# Patient Record
Sex: Female | Born: 1950 | Race: Black or African American | Hispanic: No | Marital: Married | Smoking: Never smoker
Health system: Southern US, Community
[De-identification: ages and names within clinical notes are randomized; demographics above are authoritative.]

## PROBLEM LIST (undated history)

## (undated) DIAGNOSIS — T4145XA Adverse effect of unspecified anesthetic, initial encounter: Secondary | ICD-10-CM

## (undated) DIAGNOSIS — M1990 Unspecified osteoarthritis, unspecified site: Secondary | ICD-10-CM

## (undated) DIAGNOSIS — E785 Hyperlipidemia, unspecified: Secondary | ICD-10-CM

## (undated) DIAGNOSIS — R0789 Other chest pain: Secondary | ICD-10-CM

## (undated) DIAGNOSIS — Z9889 Other specified postprocedural states: Secondary | ICD-10-CM

## (undated) DIAGNOSIS — K859 Acute pancreatitis without necrosis or infection, unspecified: Secondary | ICD-10-CM

## (undated) DIAGNOSIS — T8859XA Other complications of anesthesia, initial encounter: Secondary | ICD-10-CM

## (undated) DIAGNOSIS — K589 Irritable bowel syndrome without diarrhea: Secondary | ICD-10-CM

## (undated) DIAGNOSIS — R112 Nausea with vomiting, unspecified: Secondary | ICD-10-CM

## (undated) DIAGNOSIS — K219 Gastro-esophageal reflux disease without esophagitis: Secondary | ICD-10-CM

## (undated) DIAGNOSIS — I10 Essential (primary) hypertension: Secondary | ICD-10-CM

## (undated) DIAGNOSIS — E039 Hypothyroidism, unspecified: Secondary | ICD-10-CM

## (undated) DIAGNOSIS — J189 Pneumonia, unspecified organism: Secondary | ICD-10-CM

## (undated) HISTORY — PX: CHOLECYSTECTOMY: SHX55

## (undated) HISTORY — DX: Hyperlipidemia, unspecified: E78.5

## (undated) HISTORY — DX: Irritable bowel syndrome, unspecified: K58.9

## (undated) HISTORY — DX: Other chest pain: R07.89

## (undated) HISTORY — DX: Acute pancreatitis without necrosis or infection, unspecified: K85.90

## (undated) HISTORY — DX: Hypothyroidism, unspecified: E03.9

## (undated) HISTORY — PX: TOTAL ABDOMINAL HYSTERECTOMY: SHX209

## (undated) HISTORY — DX: Gastro-esophageal reflux disease without esophagitis: K21.9

## (undated) HISTORY — PX: HERNIA REPAIR: SHX51

## (undated) HISTORY — PX: SALPINGOOPHORECTOMY: SHX82

---
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
## 1997-08-15 ENCOUNTER — Ambulatory Visit (HOSPITAL_COMMUNITY): Admission: RE | Admit: 1997-08-15 | Discharge: 1997-08-15 | Payer: Self-pay | Admitting: *Deleted

## 1997-09-05 ENCOUNTER — Encounter: Admission: RE | Admit: 1997-09-05 | Discharge: 1997-12-04 | Payer: Self-pay | Admitting: Orthopedic Surgery

## 1997-09-26 ENCOUNTER — Ambulatory Visit (HOSPITAL_COMMUNITY): Admission: RE | Admit: 1997-09-26 | Discharge: 1997-09-26 | Payer: Self-pay | Admitting: Orthopedic Surgery

## 1997-10-16 ENCOUNTER — Ambulatory Visit (HOSPITAL_COMMUNITY): Admission: RE | Admit: 1997-10-16 | Discharge: 1997-10-16 | Payer: Self-pay | Admitting: Orthopedic Surgery

## 1997-11-08 ENCOUNTER — Ambulatory Visit (HOSPITAL_COMMUNITY): Admission: RE | Admit: 1997-11-08 | Discharge: 1997-11-08 | Payer: Self-pay | Admitting: Orthopedic Surgery

## 1997-12-19 ENCOUNTER — Ambulatory Visit (HOSPITAL_COMMUNITY): Admission: RE | Admit: 1997-12-19 | Discharge: 1997-12-19 | Payer: Self-pay | Admitting: Orthopedic Surgery

## 1998-02-01 ENCOUNTER — Encounter: Admission: RE | Admit: 1998-02-01 | Discharge: 1998-05-02 | Payer: Self-pay | Admitting: Neurological Surgery

## 1998-03-20 ENCOUNTER — Encounter: Admission: RE | Admit: 1998-03-20 | Discharge: 1998-06-09 | Payer: Self-pay | Admitting: Orthopedic Surgery

## 1998-05-08 ENCOUNTER — Ambulatory Visit (HOSPITAL_COMMUNITY): Admission: RE | Admit: 1998-05-08 | Discharge: 1998-05-08 | Payer: Self-pay | Admitting: Obstetrics and Gynecology

## 1998-06-09 ENCOUNTER — Encounter: Admission: RE | Admit: 1998-06-09 | Discharge: 1998-06-29 | Payer: Self-pay | Admitting: Orthopedic Surgery

## 1998-06-15 ENCOUNTER — Ambulatory Visit (HOSPITAL_COMMUNITY): Admission: RE | Admit: 1998-06-15 | Discharge: 1998-06-15 | Payer: Self-pay | Admitting: Gastroenterology

## 1998-09-05 ENCOUNTER — Ambulatory Visit (HOSPITAL_COMMUNITY): Admission: RE | Admit: 1998-09-05 | Discharge: 1998-09-05 | Payer: Self-pay | Admitting: Internal Medicine

## 1999-03-23 ENCOUNTER — Encounter: Payer: Self-pay | Admitting: Gastroenterology

## 1999-03-23 ENCOUNTER — Ambulatory Visit (HOSPITAL_COMMUNITY): Admission: RE | Admit: 1999-03-23 | Discharge: 1999-03-23 | Payer: Self-pay | Admitting: Gastroenterology

## 1999-04-19 ENCOUNTER — Ambulatory Visit (HOSPITAL_COMMUNITY): Admission: RE | Admit: 1999-04-19 | Discharge: 1999-04-19 | Payer: Self-pay | Admitting: General Surgery

## 1999-04-19 ENCOUNTER — Encounter: Payer: Self-pay | Admitting: General Surgery

## 1999-05-30 ENCOUNTER — Encounter: Payer: Self-pay | Admitting: Obstetrics and Gynecology

## 1999-05-30 ENCOUNTER — Ambulatory Visit (HOSPITAL_COMMUNITY): Admission: RE | Admit: 1999-05-30 | Discharge: 1999-05-30 | Payer: Self-pay | Admitting: Obstetrics and Gynecology

## 1999-06-06 ENCOUNTER — Encounter: Payer: Self-pay | Admitting: General Surgery

## 1999-06-08 ENCOUNTER — Encounter (INDEPENDENT_AMBULATORY_CARE_PROVIDER_SITE_OTHER): Payer: Self-pay | Admitting: Specialist

## 1999-06-08 ENCOUNTER — Observation Stay (HOSPITAL_COMMUNITY): Admission: RE | Admit: 1999-06-08 | Discharge: 1999-06-09 | Payer: Self-pay | Admitting: General Surgery

## 1999-08-10 ENCOUNTER — Encounter: Admission: RE | Admit: 1999-08-10 | Discharge: 1999-08-10 | Payer: Self-pay | Admitting: Otolaryngology

## 1999-08-10 ENCOUNTER — Encounter: Payer: Self-pay | Admitting: Otolaryngology

## 1999-08-17 ENCOUNTER — Encounter: Payer: Self-pay | Admitting: Internal Medicine

## 1999-08-17 ENCOUNTER — Ambulatory Visit (HOSPITAL_COMMUNITY): Admission: RE | Admit: 1999-08-17 | Discharge: 1999-08-17 | Payer: Self-pay | Admitting: Internal Medicine

## 1999-11-01 ENCOUNTER — Ambulatory Visit (HOSPITAL_COMMUNITY): Admission: RE | Admit: 1999-11-01 | Discharge: 1999-11-01 | Payer: Self-pay | Admitting: Internal Medicine

## 1999-11-14 ENCOUNTER — Encounter: Payer: Self-pay | Admitting: Internal Medicine

## 1999-11-14 ENCOUNTER — Emergency Department (HOSPITAL_COMMUNITY): Admission: EM | Admit: 1999-11-14 | Discharge: 1999-11-14 | Payer: Self-pay | Admitting: Internal Medicine

## 1999-12-12 ENCOUNTER — Ambulatory Visit (HOSPITAL_COMMUNITY): Admission: RE | Admit: 1999-12-12 | Discharge: 1999-12-12 | Payer: Self-pay | Admitting: Gastroenterology

## 2000-01-10 ENCOUNTER — Ambulatory Visit (HOSPITAL_COMMUNITY): Admission: RE | Admit: 2000-01-10 | Discharge: 2000-01-10 | Payer: Self-pay | Admitting: Gastroenterology

## 2000-01-10 ENCOUNTER — Encounter: Payer: Self-pay | Admitting: Gastroenterology

## 2000-04-11 ENCOUNTER — Encounter: Payer: Self-pay | Admitting: *Deleted

## 2000-04-11 ENCOUNTER — Ambulatory Visit (HOSPITAL_COMMUNITY): Admission: RE | Admit: 2000-04-11 | Discharge: 2000-04-11 | Payer: Self-pay

## 2000-07-15 ENCOUNTER — Ambulatory Visit (HOSPITAL_COMMUNITY): Admission: RE | Admit: 2000-07-15 | Discharge: 2000-07-15 | Payer: Self-pay | Admitting: Obstetrics and Gynecology

## 2000-07-15 ENCOUNTER — Encounter: Payer: Self-pay | Admitting: Obstetrics and Gynecology

## 2000-07-22 ENCOUNTER — Other Ambulatory Visit: Admission: RE | Admit: 2000-07-22 | Discharge: 2000-07-22 | Payer: Self-pay | Admitting: Obstetrics and Gynecology

## 2000-07-23 ENCOUNTER — Ambulatory Visit (HOSPITAL_COMMUNITY): Admission: RE | Admit: 2000-07-23 | Discharge: 2000-07-23 | Payer: Self-pay | Admitting: Internal Medicine

## 2000-10-10 ENCOUNTER — Emergency Department (HOSPITAL_COMMUNITY): Admission: EM | Admit: 2000-10-10 | Discharge: 2000-10-10 | Payer: Self-pay | Admitting: Emergency Medicine

## 2000-10-31 ENCOUNTER — Ambulatory Visit (HOSPITAL_COMMUNITY): Admission: RE | Admit: 2000-10-31 | Discharge: 2000-10-31 | Payer: Self-pay | Admitting: Gastroenterology

## 2000-10-31 ENCOUNTER — Encounter: Payer: Self-pay | Admitting: Gastroenterology

## 2001-02-09 ENCOUNTER — Emergency Department (HOSPITAL_COMMUNITY): Admission: EM | Admit: 2001-02-09 | Discharge: 2001-02-10 | Payer: Self-pay | Admitting: Emergency Medicine

## 2001-02-09 ENCOUNTER — Encounter: Payer: Self-pay | Admitting: Emergency Medicine

## 2001-03-17 ENCOUNTER — Ambulatory Visit (HOSPITAL_COMMUNITY): Admission: RE | Admit: 2001-03-17 | Discharge: 2001-03-17 | Payer: Self-pay | Admitting: Gastroenterology

## 2001-03-17 ENCOUNTER — Encounter: Payer: Self-pay | Admitting: Gastroenterology

## 2001-04-03 ENCOUNTER — Ambulatory Visit (HOSPITAL_COMMUNITY): Admission: RE | Admit: 2001-04-03 | Discharge: 2001-04-03 | Payer: Self-pay | Admitting: Gastroenterology

## 2001-04-29 ENCOUNTER — Encounter: Payer: Self-pay | Admitting: Gastroenterology

## 2001-04-29 ENCOUNTER — Ambulatory Visit (HOSPITAL_COMMUNITY): Admission: RE | Admit: 2001-04-29 | Discharge: 2001-04-29 | Payer: Self-pay | Admitting: Gastroenterology

## 2001-06-16 ENCOUNTER — Encounter: Admission: RE | Admit: 2001-06-16 | Discharge: 2001-06-16 | Payer: Self-pay | Admitting: Internal Medicine

## 2001-06-16 ENCOUNTER — Encounter: Payer: Self-pay | Admitting: Internal Medicine

## 2001-06-26 ENCOUNTER — Ambulatory Visit (HOSPITAL_COMMUNITY): Admission: RE | Admit: 2001-06-26 | Discharge: 2001-06-26 | Payer: Self-pay | Admitting: Internal Medicine

## 2001-07-08 ENCOUNTER — Ambulatory Visit (HOSPITAL_COMMUNITY): Admission: RE | Admit: 2001-07-08 | Discharge: 2001-07-08 | Payer: Self-pay | Admitting: Internal Medicine

## 2001-07-09 ENCOUNTER — Encounter: Payer: Self-pay | Admitting: Internal Medicine

## 2001-10-16 ENCOUNTER — Emergency Department (HOSPITAL_COMMUNITY): Admission: EM | Admit: 2001-10-16 | Discharge: 2001-10-16 | Payer: Self-pay

## 2001-10-19 ENCOUNTER — Ambulatory Visit (HOSPITAL_COMMUNITY): Admission: RE | Admit: 2001-10-19 | Discharge: 2001-10-19 | Payer: Self-pay | Admitting: Internal Medicine

## 2001-10-19 ENCOUNTER — Encounter: Payer: Self-pay | Admitting: Internal Medicine

## 2001-11-25 ENCOUNTER — Ambulatory Visit (HOSPITAL_COMMUNITY): Admission: RE | Admit: 2001-11-25 | Discharge: 2001-11-25 | Payer: Self-pay | Admitting: Obstetrics and Gynecology

## 2001-11-25 ENCOUNTER — Encounter: Payer: Self-pay | Admitting: Obstetrics and Gynecology

## 2001-12-07 ENCOUNTER — Encounter: Admission: RE | Admit: 2001-12-07 | Discharge: 2001-12-07 | Payer: Self-pay | Admitting: Occupational Medicine

## 2001-12-07 ENCOUNTER — Encounter: Payer: Self-pay | Admitting: Occupational Medicine

## 2002-01-04 ENCOUNTER — Encounter: Payer: Self-pay | Admitting: Internal Medicine

## 2002-02-04 ENCOUNTER — Ambulatory Visit (HOSPITAL_COMMUNITY): Admission: RE | Admit: 2002-02-04 | Discharge: 2002-02-04 | Payer: Self-pay | Admitting: Internal Medicine

## 2002-05-27 ENCOUNTER — Ambulatory Visit (HOSPITAL_COMMUNITY): Admission: RE | Admit: 2002-05-27 | Discharge: 2002-05-27 | Payer: Self-pay | Admitting: Rheumatology

## 2002-05-27 ENCOUNTER — Encounter: Payer: Self-pay | Admitting: Rheumatology

## 2002-06-04 ENCOUNTER — Encounter: Admission: RE | Admit: 2002-06-04 | Discharge: 2002-06-04 | Payer: Self-pay | Admitting: Endocrinology

## 2002-06-04 ENCOUNTER — Encounter: Payer: Self-pay | Admitting: Endocrinology

## 2002-06-11 ENCOUNTER — Encounter: Admission: RE | Admit: 2002-06-11 | Discharge: 2002-06-11 | Payer: Self-pay | Admitting: Gastroenterology

## 2002-06-11 ENCOUNTER — Encounter: Payer: Self-pay | Admitting: Gastroenterology

## 2002-06-25 ENCOUNTER — Ambulatory Visit (HOSPITAL_COMMUNITY): Admission: RE | Admit: 2002-06-25 | Discharge: 2002-06-25 | Payer: Self-pay | Admitting: Gastroenterology

## 2002-08-10 ENCOUNTER — Ambulatory Visit (HOSPITAL_COMMUNITY): Admission: RE | Admit: 2002-08-10 | Discharge: 2002-08-10 | Payer: Self-pay | Admitting: Obstetrics and Gynecology

## 2002-08-10 ENCOUNTER — Encounter: Payer: Self-pay | Admitting: Obstetrics and Gynecology

## 2002-10-29 ENCOUNTER — Encounter: Admission: RE | Admit: 2002-10-29 | Discharge: 2002-10-29 | Payer: Self-pay | Admitting: Endocrinology

## 2002-10-29 ENCOUNTER — Encounter: Payer: Self-pay | Admitting: Endocrinology

## 2002-11-16 ENCOUNTER — Encounter (HOSPITAL_COMMUNITY): Admission: RE | Admit: 2002-11-16 | Discharge: 2003-02-14 | Payer: Self-pay | Admitting: Otolaryngology

## 2002-11-17 ENCOUNTER — Encounter: Payer: Self-pay | Admitting: Otolaryngology

## 2002-12-01 ENCOUNTER — Other Ambulatory Visit: Admission: RE | Admit: 2002-12-01 | Discharge: 2002-12-01 | Payer: Self-pay | Admitting: *Deleted

## 2002-12-08 ENCOUNTER — Encounter: Payer: Self-pay | Admitting: Otolaryngology

## 2002-12-10 ENCOUNTER — Encounter: Payer: Self-pay | Admitting: Gastroenterology

## 2002-12-10 ENCOUNTER — Ambulatory Visit (HOSPITAL_COMMUNITY): Admission: RE | Admit: 2002-12-10 | Discharge: 2002-12-10 | Payer: Self-pay | Admitting: Gastroenterology

## 2003-01-06 ENCOUNTER — Encounter: Admission: RE | Admit: 2003-01-06 | Discharge: 2003-01-06 | Payer: Self-pay | Admitting: Gastroenterology

## 2003-01-06 ENCOUNTER — Encounter: Payer: Self-pay | Admitting: Gastroenterology

## 2003-03-18 ENCOUNTER — Ambulatory Visit (HOSPITAL_COMMUNITY): Admission: RE | Admit: 2003-03-18 | Discharge: 2003-03-18 | Payer: Self-pay

## 2003-04-27 ENCOUNTER — Ambulatory Visit (HOSPITAL_COMMUNITY): Admission: RE | Admit: 2003-04-27 | Discharge: 2003-04-27 | Payer: Self-pay | Admitting: Obstetrics and Gynecology

## 2003-04-29 ENCOUNTER — Ambulatory Visit (HOSPITAL_COMMUNITY): Admission: RE | Admit: 2003-04-29 | Discharge: 2003-04-29 | Payer: Self-pay | Admitting: Family Medicine

## 2003-05-06 ENCOUNTER — Ambulatory Visit (HOSPITAL_COMMUNITY): Admission: RE | Admit: 2003-05-06 | Discharge: 2003-05-06 | Payer: Self-pay | Admitting: Family Medicine

## 2003-05-10 ENCOUNTER — Ambulatory Visit (HOSPITAL_COMMUNITY): Admission: RE | Admit: 2003-05-10 | Discharge: 2003-05-10 | Payer: Self-pay | Admitting: Family Medicine

## 2003-12-13 ENCOUNTER — Emergency Department (HOSPITAL_COMMUNITY): Admission: EM | Admit: 2003-12-13 | Discharge: 2003-12-13 | Payer: Self-pay | Admitting: Emergency Medicine

## 2004-01-27 ENCOUNTER — Ambulatory Visit (HOSPITAL_COMMUNITY): Admission: RE | Admit: 2004-01-27 | Discharge: 2004-01-27 | Payer: Self-pay | Admitting: Endocrinology

## 2004-04-02 ENCOUNTER — Encounter: Admission: RE | Admit: 2004-04-02 | Discharge: 2004-04-02 | Payer: Self-pay | Admitting: Gastroenterology

## 2004-04-27 ENCOUNTER — Ambulatory Visit (HOSPITAL_COMMUNITY): Admission: RE | Admit: 2004-04-27 | Discharge: 2004-04-27 | Payer: Self-pay | Admitting: Neurological Surgery

## 2004-04-29 ENCOUNTER — Ambulatory Visit (HOSPITAL_COMMUNITY): Admission: RE | Admit: 2004-04-29 | Discharge: 2004-04-29 | Payer: Self-pay | Admitting: Neurological Surgery

## 2004-06-18 ENCOUNTER — Encounter: Admission: RE | Admit: 2004-06-18 | Discharge: 2004-06-18 | Payer: Self-pay | Admitting: Obstetrics and Gynecology

## 2004-08-08 ENCOUNTER — Other Ambulatory Visit: Admission: RE | Admit: 2004-08-08 | Discharge: 2004-08-08 | Payer: Self-pay | Admitting: Internal Medicine

## 2004-10-02 ENCOUNTER — Ambulatory Visit (HOSPITAL_COMMUNITY): Admission: RE | Admit: 2004-10-02 | Discharge: 2004-10-02 | Payer: Self-pay | Admitting: Gastroenterology

## 2004-10-19 ENCOUNTER — Encounter: Admission: RE | Admit: 2004-10-19 | Discharge: 2004-10-19 | Payer: Self-pay | Admitting: Allergy

## 2005-04-19 ENCOUNTER — Ambulatory Visit (HOSPITAL_COMMUNITY): Admission: RE | Admit: 2005-04-19 | Discharge: 2005-04-19 | Payer: Self-pay | Admitting: Endocrinology

## 2005-07-30 ENCOUNTER — Encounter: Admission: RE | Admit: 2005-07-30 | Discharge: 2005-07-30 | Payer: Self-pay | Admitting: Internal Medicine

## 2005-08-06 ENCOUNTER — Encounter: Admission: RE | Admit: 2005-08-06 | Discharge: 2005-08-06 | Payer: Self-pay | Admitting: Internal Medicine

## 2005-11-05 ENCOUNTER — Encounter: Admission: RE | Admit: 2005-11-05 | Discharge: 2005-11-05 | Payer: Self-pay | Admitting: Internal Medicine

## 2005-11-06 ENCOUNTER — Encounter: Admission: RE | Admit: 2005-11-06 | Discharge: 2005-11-06 | Payer: Self-pay | Admitting: Gastroenterology

## 2006-01-01 ENCOUNTER — Encounter: Admission: RE | Admit: 2006-01-01 | Discharge: 2006-01-01 | Payer: Self-pay | Admitting: Neurological Surgery

## 2006-01-14 ENCOUNTER — Encounter: Admission: RE | Admit: 2006-01-14 | Discharge: 2006-02-03 | Payer: Self-pay | Admitting: Neurological Surgery

## 2006-09-02 ENCOUNTER — Emergency Department (HOSPITAL_COMMUNITY): Admission: EM | Admit: 2006-09-02 | Discharge: 2006-09-02 | Payer: Self-pay | Admitting: Emergency Medicine

## 2006-11-12 ENCOUNTER — Encounter: Admission: RE | Admit: 2006-11-12 | Discharge: 2006-11-12 | Payer: Self-pay | Admitting: Internal Medicine

## 2006-11-27 ENCOUNTER — Encounter: Admission: RE | Admit: 2006-11-27 | Discharge: 2006-11-27 | Payer: Self-pay | Admitting: Internal Medicine

## 2006-12-19 ENCOUNTER — Encounter: Admission: RE | Admit: 2006-12-19 | Discharge: 2006-12-19 | Payer: Self-pay | Admitting: Internal Medicine

## 2007-02-02 ENCOUNTER — Emergency Department (HOSPITAL_COMMUNITY): Admission: EM | Admit: 2007-02-02 | Discharge: 2007-02-02 | Payer: Self-pay | Admitting: Emergency Medicine

## 2007-05-29 ENCOUNTER — Ambulatory Visit (HOSPITAL_BASED_OUTPATIENT_CLINIC_OR_DEPARTMENT_OTHER): Admission: RE | Admit: 2007-05-29 | Discharge: 2007-05-29 | Payer: Self-pay | Admitting: Internal Medicine

## 2007-06-07 ENCOUNTER — Ambulatory Visit: Payer: Self-pay | Admitting: Internal Medicine

## 2007-08-10 ENCOUNTER — Ambulatory Visit: Payer: Self-pay | Admitting: Gastroenterology

## 2007-08-10 LAB — CONVERTED CEMR LAB
ALT: 9 units/L (ref 0–35)
AST: 23 units/L (ref 0–37)
Basophils Relative: 0.9 % (ref 0.0–1.0)
Bilirubin, Direct: 0.1 mg/dL (ref 0.0–0.3)
CO2: 29 meq/L (ref 19–32)
Calcium: 9.5 mg/dL (ref 8.4–10.5)
Chloride: 103 meq/L (ref 96–112)
Eosinophils Relative: 0.9 % (ref 0.0–5.0)
Glucose, Bld: 96 mg/dL (ref 70–99)
Platelets: 324 10*3/uL (ref 150–400)
RBC: 4.23 M/uL (ref 3.87–5.11)
Total Protein: 7.6 g/dL (ref 6.0–8.3)
WBC: 9 10*3/uL (ref 4.5–10.5)

## 2007-08-13 ENCOUNTER — Encounter: Admission: RE | Admit: 2007-08-13 | Discharge: 2007-08-13 | Payer: Self-pay | Admitting: Obstetrics and Gynecology

## 2007-08-19 ENCOUNTER — Ambulatory Visit: Payer: Self-pay | Admitting: Gastroenterology

## 2007-08-27 ENCOUNTER — Encounter: Payer: Self-pay | Admitting: Internal Medicine

## 2007-10-07 DIAGNOSIS — E785 Hyperlipidemia, unspecified: Secondary | ICD-10-CM | POA: Insufficient documentation

## 2007-10-07 DIAGNOSIS — E039 Hypothyroidism, unspecified: Secondary | ICD-10-CM | POA: Insufficient documentation

## 2007-10-07 DIAGNOSIS — K859 Acute pancreatitis without necrosis or infection, unspecified: Secondary | ICD-10-CM | POA: Insufficient documentation

## 2007-10-09 ENCOUNTER — Ambulatory Visit: Payer: Self-pay | Admitting: Gastroenterology

## 2007-10-09 DIAGNOSIS — K219 Gastro-esophageal reflux disease without esophagitis: Secondary | ICD-10-CM

## 2007-10-09 DIAGNOSIS — K589 Irritable bowel syndrome without diarrhea: Secondary | ICD-10-CM

## 2007-10-09 DIAGNOSIS — K5900 Constipation, unspecified: Secondary | ICD-10-CM | POA: Insufficient documentation

## 2007-11-25 ENCOUNTER — Ambulatory Visit: Payer: Self-pay | Admitting: Pulmonary Disease

## 2007-11-25 LAB — CONVERTED CEMR LAB
AST: 19 units/L (ref 0–37)
Basophils Absolute: 0.1 10*3/uL (ref 0.0–0.1)
Basophils Relative: 0.7 % (ref 0.0–1.0)
Chloride: 104 meq/L (ref 96–112)
Creatinine, Ser: 0.9 mg/dL (ref 0.4–1.2)
Eosinophils Absolute: 0.2 10*3/uL (ref 0.0–0.7)
GFR calc Af Amer: 84 mL/min
GFR calc non Af Amer: 69 mL/min
IgE (Immunoglobulin E), Serum: 69.5 intl units/mL (ref 0.0–180.0)
MCHC: 34.1 g/dL (ref 30.0–36.0)
MCV: 88.2 fL (ref 78.0–100.0)
Monocytes Absolute: 0.6 10*3/uL (ref 0.1–1.0)
Neutrophils Relative %: 60.5 % (ref 43.0–77.0)
Platelets: 340 10*3/uL (ref 150–400)
RBC: 4.21 M/uL (ref 3.87–5.11)
TSH: 1.09 microintl units/mL (ref 0.35–5.50)
Total Bilirubin: 0.7 mg/dL (ref 0.3–1.2)

## 2007-11-30 ENCOUNTER — Telehealth (INDEPENDENT_AMBULATORY_CARE_PROVIDER_SITE_OTHER): Payer: Self-pay | Admitting: *Deleted

## 2007-12-09 ENCOUNTER — Encounter: Admission: RE | Admit: 2007-12-09 | Discharge: 2007-12-09 | Payer: Self-pay | Admitting: Internal Medicine

## 2007-12-14 ENCOUNTER — Encounter (INDEPENDENT_AMBULATORY_CARE_PROVIDER_SITE_OTHER): Payer: Self-pay | Admitting: Internal Medicine

## 2007-12-14 ENCOUNTER — Ambulatory Visit: Payer: Self-pay | Admitting: Vascular Surgery

## 2007-12-14 ENCOUNTER — Ambulatory Visit (HOSPITAL_COMMUNITY): Admission: RE | Admit: 2007-12-14 | Discharge: 2007-12-14 | Payer: Self-pay | Admitting: Internal Medicine

## 2008-01-05 ENCOUNTER — Ambulatory Visit: Payer: Self-pay | Admitting: Pulmonary Disease

## 2008-01-05 DIAGNOSIS — J309 Allergic rhinitis, unspecified: Secondary | ICD-10-CM

## 2008-02-03 ENCOUNTER — Ambulatory Visit (HOSPITAL_COMMUNITY): Admission: RE | Admit: 2008-02-03 | Discharge: 2008-02-03 | Payer: Self-pay | Admitting: Specialist

## 2008-02-03 ENCOUNTER — Ambulatory Visit: Payer: Self-pay | Admitting: Vascular Surgery

## 2008-02-03 ENCOUNTER — Encounter (INDEPENDENT_AMBULATORY_CARE_PROVIDER_SITE_OTHER): Payer: Self-pay | Admitting: Specialist

## 2008-03-05 ENCOUNTER — Ambulatory Visit (HOSPITAL_COMMUNITY): Admission: RE | Admit: 2008-03-05 | Discharge: 2008-03-05 | Payer: Self-pay | Admitting: Neurological Surgery

## 2008-06-16 ENCOUNTER — Ambulatory Visit: Payer: Self-pay | Admitting: Critical Care Medicine

## 2008-07-06 ENCOUNTER — Ambulatory Visit: Payer: Self-pay | Admitting: Pulmonary Disease

## 2008-07-21 ENCOUNTER — Other Ambulatory Visit: Admission: RE | Admit: 2008-07-21 | Discharge: 2008-07-21 | Payer: Self-pay | Admitting: Obstetrics and Gynecology

## 2008-08-02 ENCOUNTER — Ambulatory Visit: Payer: Self-pay | Admitting: Pulmonary Disease

## 2008-08-02 ENCOUNTER — Encounter: Payer: Self-pay | Admitting: Pulmonary Disease

## 2008-08-02 DIAGNOSIS — J4530 Mild persistent asthma, uncomplicated: Secondary | ICD-10-CM | POA: Insufficient documentation

## 2008-08-02 LAB — CONVERTED CEMR LAB
AST: 22 units/L (ref 0–37)
Albumin: 3.6 g/dL (ref 3.5–5.2)
Alkaline Phosphatase: 84 units/L (ref 39–117)
BUN: 15 mg/dL (ref 6–23)
Basophils Relative: 0.4 % (ref 0.0–3.0)
Bilirubin, Direct: 0.1 mg/dL (ref 0.0–0.3)
Calcium: 9.4 mg/dL (ref 8.4–10.5)
Creatinine, Ser: 0.9 mg/dL (ref 0.4–1.2)
Eosinophils Relative: 0.8 % (ref 0.0–5.0)
Hemoglobin: 12.7 g/dL (ref 12.0–15.0)
Lymphocytes Relative: 26.3 % (ref 12.0–46.0)
Monocytes Relative: 6.8 % (ref 3.0–12.0)
Neutro Abs: 6.2 10*3/uL (ref 1.4–7.7)
Neutrophils Relative %: 65.7 % (ref 43.0–77.0)
RBC: 4.1 M/uL (ref 3.87–5.11)
Total Protein: 7.9 g/dL (ref 6.0–8.3)
WBC: 9.4 10*3/uL (ref 4.5–10.5)

## 2008-08-08 ENCOUNTER — Telehealth (INDEPENDENT_AMBULATORY_CARE_PROVIDER_SITE_OTHER): Payer: Self-pay | Admitting: *Deleted

## 2008-08-08 LAB — CONVERTED CEMR LAB: IgE (Immunoglobulin E), Serum: 63.9 intl units/mL (ref 0.0–180.0)

## 2008-08-24 ENCOUNTER — Ambulatory Visit: Payer: Self-pay | Admitting: Pulmonary Disease

## 2008-09-12 ENCOUNTER — Encounter: Admission: RE | Admit: 2008-09-12 | Discharge: 2008-09-12 | Payer: Self-pay | Admitting: Endocrinology

## 2008-10-10 ENCOUNTER — Telehealth: Payer: Self-pay | Admitting: Gastroenterology

## 2008-11-07 ENCOUNTER — Ambulatory Visit (HOSPITAL_COMMUNITY): Admission: RE | Admit: 2008-11-07 | Discharge: 2008-11-07 | Payer: Self-pay | Admitting: Rheumatology

## 2008-11-25 ENCOUNTER — Telehealth: Payer: Self-pay | Admitting: Pulmonary Disease

## 2008-11-28 ENCOUNTER — Ambulatory Visit: Payer: Self-pay | Admitting: Gastroenterology

## 2008-11-28 ENCOUNTER — Ambulatory Visit (HOSPITAL_COMMUNITY): Admission: RE | Admit: 2008-11-28 | Discharge: 2008-11-28 | Payer: Self-pay | Admitting: Rheumatology

## 2008-12-27 ENCOUNTER — Encounter: Admission: RE | Admit: 2008-12-27 | Discharge: 2008-12-27 | Payer: Self-pay | Admitting: Obstetrics and Gynecology

## 2009-01-03 ENCOUNTER — Ambulatory Visit: Payer: Self-pay | Admitting: Gastroenterology

## 2009-01-04 LAB — CONVERTED CEMR LAB
ALT: 9 units/L (ref 0–35)
Albumin: 3.7 g/dL (ref 3.5–5.2)
Alkaline Phosphatase: 72 units/L (ref 39–117)
Basophils Absolute: 0.1 10*3/uL (ref 0.0–0.1)
Eosinophils Relative: 1.2 % (ref 0.0–5.0)
Glucose, Bld: 101 mg/dL — ABNORMAL HIGH (ref 70–99)
HCT: 34.6 % — ABNORMAL LOW (ref 36.0–46.0)
Lymphs Abs: 3.1 10*3/uL (ref 0.7–4.0)
MCV: 87.1 fL (ref 78.0–100.0)
Monocytes Absolute: 0.8 10*3/uL (ref 0.1–1.0)
Monocytes Relative: 7.9 % (ref 3.0–12.0)
Neutrophils Relative %: 58.1 % (ref 43.0–77.0)
Platelets: 282 10*3/uL (ref 150.0–400.0)
Potassium: 3.9 meq/L (ref 3.5–5.1)
RDW: 12.5 % (ref 11.5–14.6)
Sodium: 142 meq/L (ref 135–145)
Total Bilirubin: 0.6 mg/dL (ref 0.3–1.2)
Total Protein: 7.7 g/dL (ref 6.0–8.3)
WBC: 9.7 10*3/uL (ref 4.5–10.5)

## 2009-03-16 ENCOUNTER — Ambulatory Visit: Payer: Self-pay | Admitting: Pulmonary Disease

## 2009-03-21 ENCOUNTER — Ambulatory Visit: Payer: Self-pay | Admitting: Gastroenterology

## 2009-04-29 ENCOUNTER — Telehealth: Payer: Self-pay | Admitting: Internal Medicine

## 2009-05-02 ENCOUNTER — Ambulatory Visit: Payer: Self-pay | Admitting: Gastroenterology

## 2009-05-22 ENCOUNTER — Telehealth: Payer: Self-pay | Admitting: Gastroenterology

## 2009-06-13 ENCOUNTER — Ambulatory Visit: Payer: Self-pay | Admitting: Gastroenterology

## 2009-06-13 DIAGNOSIS — K3189 Other diseases of stomach and duodenum: Secondary | ICD-10-CM

## 2009-06-13 DIAGNOSIS — R1013 Epigastric pain: Secondary | ICD-10-CM

## 2009-06-15 LAB — CONVERTED CEMR LAB
ALT: 21 units/L (ref 0–35)
Alkaline Phosphatase: 68 units/L (ref 39–117)
Basophils Absolute: 0 10*3/uL (ref 0.0–0.1)
CO2: 30 meq/L (ref 19–32)
Creatinine, Ser: 0.9 mg/dL (ref 0.4–1.2)
Eosinophils Absolute: 0.1 10*3/uL (ref 0.0–0.7)
GFR calc non Af Amer: 83.18 mL/min (ref >60)
HCT: 37.3 % (ref 36.0–46.0)
Hemoglobin: 12.3 g/dL (ref 12.0–15.0)
Lymphs Abs: 2.3 10*3/uL (ref 0.7–4.0)
MCHC: 33.1 g/dL (ref 30.0–36.0)
MCV: 90.5 fL (ref 78.0–100.0)
Monocytes Absolute: 0.7 10*3/uL (ref 0.1–1.0)
Neutro Abs: 1.8 10*3/uL (ref 1.4–7.7)
Platelets: 253 10*3/uL (ref 150.0–400.0)
RDW: 12.7 % (ref 11.5–14.6)
Sodium: 142 meq/L (ref 135–145)
Total Bilirubin: 0.5 mg/dL (ref 0.3–1.2)

## 2009-07-26 ENCOUNTER — Encounter: Admission: RE | Admit: 2009-07-26 | Discharge: 2009-10-23 | Payer: Self-pay | Admitting: Internal Medicine

## 2009-08-04 ENCOUNTER — Ambulatory Visit: Payer: Self-pay | Admitting: Gastroenterology

## 2009-08-04 DIAGNOSIS — R109 Unspecified abdominal pain: Secondary | ICD-10-CM

## 2009-08-10 ENCOUNTER — Ambulatory Visit (HOSPITAL_COMMUNITY): Admission: RE | Admit: 2009-08-10 | Discharge: 2009-08-10 | Payer: Self-pay | Admitting: Internal Medicine

## 2009-08-10 ENCOUNTER — Ambulatory Visit: Payer: Self-pay | Admitting: Cardiology

## 2009-08-11 ENCOUNTER — Encounter (INDEPENDENT_AMBULATORY_CARE_PROVIDER_SITE_OTHER): Payer: Self-pay | Admitting: *Deleted

## 2009-08-22 ENCOUNTER — Encounter (INDEPENDENT_AMBULATORY_CARE_PROVIDER_SITE_OTHER): Payer: Self-pay | Admitting: *Deleted

## 2009-08-23 ENCOUNTER — Ambulatory Visit: Payer: Self-pay | Admitting: Gastroenterology

## 2009-08-28 ENCOUNTER — Ambulatory Visit: Payer: Self-pay | Admitting: Gastroenterology

## 2009-10-31 ENCOUNTER — Ambulatory Visit: Payer: Self-pay | Admitting: Pulmonary Disease

## 2009-12-28 ENCOUNTER — Encounter: Admission: RE | Admit: 2009-12-28 | Discharge: 2009-12-28 | Payer: Self-pay | Admitting: Obstetrics and Gynecology

## 2010-01-08 ENCOUNTER — Other Ambulatory Visit: Admission: RE | Admit: 2010-01-08 | Discharge: 2010-01-08 | Payer: Self-pay | Admitting: Obstetrics and Gynecology

## 2010-01-18 ENCOUNTER — Telehealth: Payer: Self-pay | Admitting: Gastroenterology

## 2010-04-09 ENCOUNTER — Encounter: Payer: Self-pay | Admitting: Gastroenterology

## 2010-04-09 DIAGNOSIS — R1319 Other dysphagia: Secondary | ICD-10-CM

## 2010-04-09 DIAGNOSIS — R1011 Right upper quadrant pain: Secondary | ICD-10-CM

## 2010-04-11 ENCOUNTER — Ambulatory Visit: Payer: Self-pay | Admitting: Gastroenterology

## 2010-04-16 LAB — CONVERTED CEMR LAB
ALT: <8 units/L (ref 0–35)
AST: 19 units/L (ref 0–37)
Albumin: 3.9 g/dL (ref 3.5–5.2)
Amylase: 55 units/L (ref 0–105)
BUN: 15 mg/dL (ref 6–23)
CO2: 27 meq/L (ref 19–32)
Calcium: 9.1 mg/dL (ref 8.4–10.5)
Chloride: 104 meq/L (ref 96–112)
Potassium: 3.8 meq/L (ref 3.5–5.3)

## 2010-04-18 ENCOUNTER — Ambulatory Visit (HOSPITAL_COMMUNITY)
Admission: RE | Admit: 2010-04-18 | Discharge: 2010-04-18 | Payer: Self-pay | Source: Home / Self Care | Admitting: Gastroenterology

## 2010-05-28 ENCOUNTER — Ambulatory Visit
Admission: RE | Admit: 2010-05-28 | Discharge: 2010-05-28 | Payer: Self-pay | Source: Home / Self Care | Attending: Cardiovascular Disease | Admitting: Cardiovascular Disease

## 2010-05-28 ENCOUNTER — Encounter: Payer: Self-pay | Admitting: Cardiovascular Disease

## 2010-05-28 DIAGNOSIS — R072 Precordial pain: Secondary | ICD-10-CM | POA: Insufficient documentation

## 2010-05-28 DIAGNOSIS — R0989 Other specified symptoms and signs involving the circulatory and respiratory systems: Secondary | ICD-10-CM

## 2010-05-28 DIAGNOSIS — R0609 Other forms of dyspnea: Secondary | ICD-10-CM | POA: Insufficient documentation

## 2010-05-28 DIAGNOSIS — I10 Essential (primary) hypertension: Secondary | ICD-10-CM | POA: Insufficient documentation

## 2010-05-31 ENCOUNTER — Ambulatory Visit (HOSPITAL_COMMUNITY)
Admission: RE | Admit: 2010-05-31 | Discharge: 2010-05-31 | Payer: Self-pay | Source: Home / Self Care | Attending: Cardiovascular Disease | Admitting: Cardiovascular Disease

## 2010-05-31 ENCOUNTER — Ambulatory Visit: Admission: RE | Admit: 2010-05-31 | Discharge: 2010-05-31 | Payer: Self-pay | Source: Home / Self Care

## 2010-05-31 ENCOUNTER — Ambulatory Visit: Admit: 2010-05-31 | Payer: Self-pay

## 2010-06-09 ENCOUNTER — Encounter: Payer: Self-pay | Admitting: Family Medicine

## 2010-06-10 ENCOUNTER — Encounter: Payer: Self-pay | Admitting: Internal Medicine

## 2010-06-10 ENCOUNTER — Encounter: Payer: Self-pay | Admitting: Occupational Medicine

## 2010-06-11 ENCOUNTER — Encounter: Payer: Self-pay | Admitting: Rheumatology

## 2010-06-11 ENCOUNTER — Encounter: Payer: Self-pay | Admitting: Internal Medicine

## 2010-06-11 ENCOUNTER — Encounter: Payer: Self-pay | Admitting: Obstetrics and Gynecology

## 2010-06-19 NOTE — Letter (Signed)
Summary: Previsit letter  [facility]  [address] [place], [place] [address]   Phone: [phone]  Fax: [phone]       08/11/2009 MRN: [id number]  [facility] [address] [place], [place]  [address]  Dear Dominique Mooney,  Welcome to the Gastroenterology Division at [facility]    You are scheduled to see a nurse for your pre-procedure visit on 08/23/09   at 3:30 pm on the 3rd floor at [facility], [address]  We ask that you try to arrive at our office 15 minutes prior to your appointment time to allow for check-in.  Your nurse visit will consist of discussing your medical and surgical history, your immediate family medical history, and your medications.    Please bring a complete list of all your medications or, if you prefer, bring the medication bottles and we will list them.  We will need to be aware of both prescribed and over the counter drugs.  We will need to know exact dosage information as well.  If you are on blood thinners (Coumadin, Plavix, Aggrenox, Ticlid, etc.) please call our office today/prior to your appointment, as we need to consult with your physician about holding your medication.   Please be prepared to read and sign documents such as consent forms, a financial agreement, and acknowledgement forms.  If necessary, and with your consent, a friend or relative is welcome to sit-in on the nurse visit with you.  Please bring your insurance card so that we may make a copy of it.  If your insurance requires a referral to see a specialist, please bring your referral form from your primary care physician.  No co-pay is required for this nurse visit.     If you cannot keep your appointment, please call [phone] to cancel or reschedule prior to your appointment date.  This allows [place] the opportunity to schedule an appointment for another patient in need of care.    Thank you for choosing [facility] for your medical needs.   We appreciate the opportunity to care for you.  Please visit [place] at our website  to learn more about our practice.                     Sincerely.                                                                                                                   The Gastroenterology Division

## 2010-06-19 NOTE — Progress Notes (Signed)
Summary: Triage   Phone Note Call from Patient Call back at Home Phone [phone] Call back at [phone]   Caller: Patient Call For: Dr. Christella Hartigan Reason for Call: Talk to Nurse Summary of Call: Pt incresaed her Prilosec and is still having alot of burning and pain in the right side of stomach Initial call taken by: Karna Christmas,  May 22, 2009 12:14 PM  Follow-up for Phone Call        pt having increased abdominal pain, belching, nausea with meals.  Has appt on 06/13/09.  Is there anything else she can do until then.  She doubled her prilosec and is still using the levsin with no relief. Please advise.  Follow-up by: Chales Abrahams CMA Duncan Dull),  May 22, 2009 12:28 PM  Additional Follow-up for Phone Call Additional follow up Details #1::        should take 2 gas ex with every meal Additional Follow-up by: Rachael Fee MD,  May 23, 2009 7:00 AM    Additional Follow-up for Phone Call Additional follow up Details #2::    pt has been informed of Dr Christella Hartigan instructions.  She will keep appt on 1/25. Follow-up by: Chales Abrahams CMA Duncan Dull),  May 23, 2009 8:13 AM

## 2010-06-19 NOTE — Assessment & Plan Note (Signed)
Summary: rov/apc   Visit Type:  Follow-up Copy to:  Dominique Mooney Primary Provider/Referring Provider:  Juline Patch, MD  CC:  Asthma follow-up.  The patient c/o increased sob all the time for 2-3 days...heavy audible breathing...sinus drainage.  Needs refills on Proventil and Singulair.Marland Kitchen  History of Present Illness: I saw Dominique Mooney in follow up for her chronic cough in the setting of asthma and rhinitis with post-nasal drip.  She has been feeling more short of breath over the weekend.  She used her proventil, and this helped.  She has not had much cough or sputum.  She has been getting post nasal drip and a tickle in her throat.  She denies headache, fever, or wheeze.   Current Medications (verified): 1)  Symbicort 160-4.5 Mcg/act Aero (Budesonide-Formoterol Fumarate) .... Two Puffs Two Times A Day 2)  Singulair 10 Mg  Tabs (Montelukast Sodium) .... One By Mouth At Bedtime 3)  Claritin 10 Mg  Caps (Loratadine) .... One By Mouth Once Daily As Needed 4)  Nasonex 50 Mcg/act  Susp (Mometasone Furoate) .... 2 Sprays Once Daily 5)  Proventil Hfa 108 (90 Base) Mcg/act  Aers (Albuterol Sulfate) .... 2 Puffs Qid As Needed 6)  Levothroid 100 Mcg  Tabs (Levothyroxine Sodium) .Marland Kitchen.. 1 Once Daily 7)  Crestor 20 Mg Tabs (Rosuvastatin Calcium) .... Once Daily 8)  Daily Multiple Vitamins   Tabs (Multiple Vitamin) .Marland Kitchen.. 1 Once Daily 9)  Omega-3 350 Mg  Caps (Omega-3 Fatty Acids) .Marland Kitchen.. 1 Once Daily 10)  Baby Aspirin 81 Mg  Chew (Aspirin) .Marland Kitchen.. 1 Once Daily 11)  Levsin/sl 0.125 Mg  Subl (Hyoscyamine Sulfate) .... Take One To Two Pills Every 5-6 Hours As Needed For Spasm Pains 12)  Prilosec Otc 20 Mg Tbec (Omeprazole Magnesium) .Marland Kitchen.. 1 Pill By Mouth Two Times A Day 13)  Pepcid 20 Mg Tabs (Famotidine) .Marland Kitchen.. 1 By Mouth At Bedtime  Allergies (verified): 1)  ! Pcn  Past History:  Past Medical History: Hypothyroidism Irritable Bowel Syndrome GERD Pancreatitis after ERCP Hyperlipidemia Allergic rhinitis   - RAST 08/02/08 negative, IgE 63.9 Asthma      - PFT 01/05/08 FEV1 2.36 (106%), TLC 4.16 (92%), DLCO 89%, no BD response  Past Surgical History: Reviewed history from 11/25/2007 and no changes required. Cholecystectomy Hernia Surgery Hysterectomy  Salpingo-oophorectomy  Vital Signs:  Patient profile:   60 year old female Height:      61 inches (154.94 cm) Weight:      208 pounds (94.55 kg) BMI:     39.44 O2 Sat:      98 % on Room air Temp:     98.2 degrees F (36.78 degrees C) oral Pulse rate:   83 / minute BP sitting:   122 / 72  (left arm) Cuff size:   large  Vitals Entered By: Dominique Mooney CMA (October 31, 2009 2:13 PM)  O2 Sat at Rest %:  98 O2 Flow:  Room air CC: Asthma follow-up.  The patient c/o increased sob all the time for 2-3 days...heavy audible breathing...sinus drainage.  Needs refills on Proventil and Singulair. Comments Medications reviewed. Daytime phone verified. Dominique Mooney CMA  October 31, 2009 2:14 PM   Physical Exam  General:  normal appearance and obese.   Nose:  clear nasal discharge, no sinus tenderness Mouth:  no oral lesions Neck:  no JVD.   Lungs:  decreased breath sounds, no wheezing Heart:  regular rhythm, normal rate, and no murmurs.   Extremities:  no clubbing, cyanosis,  edema, or deformity noted Cervical Nodes:  no significant adenopathy   Impression & Recommendations:  Problem # 1:  ASTHMA (ICD-493.90) She likely had worsening symptoms from the heat and humidity.  I have advised her to avoid outdoor exposure during the middle part of the day as possible.  Will continue her current regimen.  Problem # 2:  ALLERGIC RHINITIS (ICD-477.9) Some of her symptoms are likely coming from post-nasal drip.  Advised her to use her nasonex on a more regular basis.  Complete Medication List: 1)  Symbicort 160-4.5 Mcg/act Aero (Budesonide-formoterol fumarate) .... Two puffs two times a day 2)  Singulair 10 Mg Tabs (Montelukast sodium) .... One by mouth  at bedtime 3)  Claritin 10 Mg Caps (Loratadine) .... One by mouth once daily as needed 4)  Nasonex 50 Mcg/act Susp (Mometasone furoate) .... 2 sprays once daily 5)  Proventil Hfa 108 (90 Base) Mcg/act Aers (Albuterol sulfate) .... 2 puffs qid as needed 6)  Levothroid 100 Mcg Tabs (Levothyroxine sodium) .Marland Kitchen.. 1 once daily 7)  Crestor 20 Mg Tabs (Rosuvastatin calcium) .... Once daily 8)  Daily Multiple Vitamins Tabs (Multiple vitamin) .Marland Kitchen.. 1 once daily 9)  Omega-3 350 Mg Caps (Omega-3 fatty acids) .Marland Kitchen.. 1 once daily 10)  Baby Aspirin 81 Mg Chew (Aspirin) .Marland Kitchen.. 1 once daily 11)  Levsin/sl 0.125 Mg Subl (Hyoscyamine sulfate) .... Take one to two pills every 5-6 hours as needed for spasm pains 12)  Prilosec Otc 20 Mg Tbec (Omeprazole magnesium) .Marland Kitchen.. 1 pill by mouth two times a day 13)  Pepcid 20 Mg Tabs (Famotidine) .Marland Kitchen.. 1 by mouth at bedtime  Other Orders: Est. Patient Level III ([address]) Prescription Created Electronically [phone])  Patient Instructions: 1)  Use nasonex two sprays once daily for sinuses 2)  Follow up in 6 months Prescriptions: PROVENTIL HFA 108 (90 BASE) MCG/ACT  AERS (ALBUTEROL SULFATE) 2 puffs qid as needed  #1 x 6   Entered and Authorized by:   Dominique Helling MD   Signed by:   Dominique Helling MD on 10/31/2009   Method used:   [facility]  [address] [phone]* (retail)       [address]       [place], [place]  [address]       Ph: [phone] or [phone]       Fax: [phone]   RxID:   [id number] NASONEX 50 MCG/ACT  SUSP (MOMETASONE FUROATE) 2 sprays once daily  #1 x 6   Entered and Authorized by:   Dominique Helling MD   Signed by:   Dominique Helling MD on 10/31/2009   Method used:   [facility]        [facility]  [address] [phone]* (retail)       [address]       [place], [place]  [address]       Ph: [phone] or [phone]       Fax: [phone]   RxID:   [id number] SINGULAIR  10 MG  TABS (MONTELUKAST SODIUM) one by mouth at bedtime  #30 x 6   Entered and Authorized by:   Dominique Helling MD   Signed by:   Dominique Helling MD on 10/31/2009   Method used:   [facility]        [facility]  [address] [phone]* (retail)  [address]       [place], [place]  [address]       Ph: [phone] or [phone]       Fax: [phone]   RxID:   [id number] SYMBICORT 160-4.5 MCG/ACT AERO (BUDESONIDE-FORMOTEROL FUMARATE) two puffs two times a day  #1 x 6   Entered and Authorized by:   Dominique Helling MD   Signed by:   Dominique Helling MD on 10/31/2009   Method used:   [facility]        [facility]  [address] [phone]* (retail)       [address]       [place], [place]  [address]       Ph: [phone] or [phone]       Fax: [phone]   RxID:   [id number]

## 2010-06-19 NOTE — Assessment & Plan Note (Signed)
Review of gastrointestinal problems: 1. Dysphasia, nausea 2009. EGD may 2009 was normal. Symptoms thought to be GERD related. 2. routine risk for colon cancer, colonoscopy may 2009: hemorrhoids only. Next colonoscopy may 2019; Intermittent IBS-like discomforts that are helped with antispasmodics usually. 3. post ERCP pancreatitis, 2000 4. laparoscopic cholecystectomy, 2000  History of Present Illness Visit Type: Follow-up Visit Primary GI MD: Rob Bunting MD Primary Provider: Juline Patch, MD Chief Complaint: Follow up History of Present Illness:     since adding H2 blocker she has definitely noticed an improvement in her acid symptoms.  She is VERY concerned about a Twisting sensation, burning on right upper and lower abd. These symptoms have been going on for about 3-4 months. Over this period she is not have any overt bleeding and has been gaining weight (has gained 4-5 pounds in a year).           Current Medications (verified): 1)  Symbicort 160-4.5 Mcg/act Aero (Budesonide-Formoterol Fumarate) .... Two Puffs Two Times A Day 2)  Singulair 10 Mg  Tabs (Montelukast Sodium) .... One By Mouth At Bedtime 3)  Claritin 10 Mg  Caps (Loratadine) .... One By Mouth Once Daily As Needed 4)  Nasonex 50 Mcg/act  Susp (Mometasone Furoate) .... 2 Sprays Once Daily 5)  Proventil Hfa 108 (90 Base) Mcg/act  Aers (Albuterol Sulfate) .... 2 Puffs Qid As Needed 6)  Levothroid 100 Mcg  Tabs (Levothyroxine Sodium) .Marland Kitchen.. 1 Once Daily 7)  Crestor 20 Mg Tabs (Rosuvastatin Calcium) .... Once Daily 8)  Daily Multiple Vitamins   Tabs (Multiple Vitamin) .Marland Kitchen.. 1 Once Daily 9)  Omega-3 350 Mg  Caps (Omega-3 Fatty Acids) .Marland Kitchen.. 1 Once Daily 10)  Baby Aspirin 81 Mg  Chew (Aspirin) .Marland Kitchen.. 1 Once Daily 11)  Levsin/sl 0.125 Mg  Subl (Hyoscyamine Sulfate) .... Take One To Two Pills Every 5-6 Hours As Needed For Spasm Pains 12)  Prilosec Otc 20 Mg Tbec (Omeprazole Magnesium) .Marland Kitchen.. 1 Pill By Mouth Two Times A Day 13)   Pepcid 20 Mg Tabs (Famotidine) .Marland Kitchen.. 1 By Mouth At Bedtime  Allergies (verified): 1)  ! Pcn  Vital Signs:  Patient profile:   60 year old female Height:      61 inches Weight:      209 pounds BMI:     39.63 BSA:     1.93 Pulse rate:   80 / minute Pulse rhythm:   regular BP sitting:   126 / 78  (left arm)  Vitals Entered By: Merri Ray CMA Duncan Dull) (August 04, 2009 2:12 PM)  Physical Exam  Additional Exam:  Constitutional: generally well appearing Psychiatric: alert and oriented times 3 Abdomen: soft, non-tender, non-distended, normal bowel sounds    Impression & Recommendations:  Problem # 1:  GERD (ICD-530.81) her symptoms have improved since she started taking H2 blockers at night. She'll continue that along with twice daily proton pump inhibitor  Problem # 2:  right-sided abdominal pains she feels a twisting sensation in her right side, she is sure that there is something seriously wrong going on. She has burning sensations up and down her right side. I think we should proceed with a CT scan IV and oral contrast of her abdomen and pelvis.   Problem # 3:  tenesmus in the absence of diarrhea or overt bleeding, I suspect this is more related to IBS. It is a relatively new symptom for her however and if the CAT scan is unrevealing I think we should proceed with either  flexible sigmoidoscopy or full colonoscopy.  Patient Instructions: 1)  You will be scheduled for a CT scan of abdomen and pelvis with IV and oral contrast. 2)  If this does not explain your symptoms, will proceed with repeat colonoscopy or flexible sigmoidoscopy. 3)  A copy of this information will be sent to Dr. Ricki Miller. 4)  The medication list was reviewed and reconciled.  All changed / newly prescribed medications were explained.  A complete medication list was provided to the patient / caregiver.  Appended Document: Orders Update/CT    Clinical Lists Changes  Problems: Added new problem of ABDOMINAL  PAIN OTHER SPECIFIED SITE (ICD-789.09) Orders: Added new Referral order of CT Abdomen/Pelvis with Contrast (CT Abd/Pelvis w/con) - Signed

## 2010-06-19 NOTE — Assessment & Plan Note (Signed)
Review of gastrointestinal problems: 1. Dysphasia, nausea 2009. EGD may 2009 was normal. Symptoms thought to be GERD related. 2. routine risk for colon cancer, colonoscopy may 2009: hemorrhoids only. Next colonoscopy may 2019; Intermittent IBS-like discomforts that are helped with antispasmodics usually. 3. post ERCP pancreatitis, 2000 4. laparoscopic cholecystectomy, 2000    History of Present Illness Visit Type: Follow-up Visit Primary GI MD: Rob Bunting MD Primary Provider: Juline Patch, MD Chief Complaint: 6 week f/u  History of Present Illness:     very pleasant 60 year old woman whom I last saw about one month ago.  the nauseas is improved overall but still present (this is while taking prilosec twice a day, gas ex with every meal).  Still has right sided abd pain, burning, usually every day, intermittent.  Sometimes a BM will help the sensation.  she explained to me today that her husband was found to have H. pylori bacteria in his stomach at the [place] recently. It does not sound as if this were diagnosed endoscopically.           Current Medications (verified): 1)  Symbicort 160-4.5 Mcg/act Aero (Budesonide-Formoterol Fumarate) .... Two Puffs Two Times A Day 2)  Singulair 10 Mg  Tabs (Montelukast Sodium) .... One By Mouth At Bedtime 3)  Claritin 10 Mg  Caps (Loratadine) .... One By Mouth Once Daily As Needed 4)  Nasonex 50 Mcg/act  Susp (Mometasone Furoate) .... 2 Sprays Once Daily 5)  Proventil Hfa 108 (90 Base) Mcg/act  Aers (Albuterol Sulfate) .... 2 Puffs Qid As Needed 6)  Levothroid 100 Mcg  Tabs (Levothyroxine Sodium) .Marland Kitchen.. 1 Once Daily 7)  Crestor 20 Mg Tabs (Rosuvastatin Calcium) .... Once Daily 8)  Daily Multiple Vitamins   Tabs (Multiple Vitamin) .Marland Kitchen.. 1 Once Daily 9)  Omega-3 350 Mg  Caps (Omega-3 Fatty Acids) .Marland Kitchen.. 1 Once Daily 10)  Baby Aspirin 81 Mg  Chew (Aspirin) .Marland Kitchen.. 1 Once Daily 11)  Levsin/sl 0.125 Mg  Subl (Hyoscyamine Sulfate) .... Take One To Two Pills  Every 5-6 Hours As Needed For Spasm Pains  Allergies (verified): 1)  ! Pcn  Vital Signs:  Patient profile:   60 year old female Height:      61 inches Weight:      206.38 pounds BMI:     39.14 Pulse rate:   86 / minute Pulse rhythm:   regular BP sitting:   120 / 70  (right arm)  Vitals Entered By: Chales Abrahams CMA Duncan Dull) (June 13, 2009 2:23 PM)  Physical Exam  Additional Exam:  Constitutional: generally well appearing Psychiatric: alert and oriented times 3 Abdomen: soft, non-tender, non-distended, normal bowel sounds    Impression & Recommendations:  Problem # 1:  Dyspepsia, nausea GERD related? EGD in 2009 was normal. Her husband was recently told he had H. pylori infection and was treated with antibiotics. Perhaps she also has that infection. She will get a basic set of labs today including a CBC, complete metabolic profile, H. pylori serology. If the H. pylori is very elevated then I would likely treat her with antibiotics. Starting today she will also add H2 blocker at bedtime. She will return to see me in 6-8 weeks and sooner if needed.  Other Orders: TLB-CBC Platelet - w/Differential (85025-CBCD) TLB-CMP (Comprehensive Metabolic Pnl) (80053-COMP) TLB-H. Pylori Abs(Helicobacter Pylori) (86677-HELICO)  Patient Instructions: 1)  Continue prilosec twice dialy (20-30 min before breakfast and dinner). 2)  Add pepcid, over the counter, one pill at bedtime. 3)  Please schedule  a follow-up appointment in 6 to 8 weeks.  4)  You will get lab test(s) done today (cbc, cmet, h. pylori serology). 5)  A copy of this information will be sent to Dr. Ricki Miller. 6)  The medication list was reviewed and reconciled.  All changed / newly prescribed medications were explained.  A complete medication list was provided to the patient / caregiver.  Appended Document: Prilosec/Pepcid Pt needed these sent to the pharmacy due to the flex card requiring a rx to pay    Clinical Lists  Changes  Medications: Added new medication of PRILOSEC OTC 20 MG TBEC (OMEPRAZOLE MAGNESIUM) 1 pill by mouth once daily - Signed Added new medication of PEPCID 20 MG TABS (FAMOTIDINE) 1 by mouth at bedtime - Signed Rx of PRILOSEC OTC 20 MG TBEC (OMEPRAZOLE MAGNESIUM) 1 pill by mouth once daily;  #90 x 3;  Signed;  Entered by: Chales Abrahams CMA (AAMA);  Authorized by: Rachael Fee MD;  Method [facility]: [facility] [address] [phone]*, [address] [place], [place], [place]  [address], Ph: [phone] or [phone], Fax: [phone] Rx of PEPCID 20 MG TABS (FAMOTIDINE) 1 by mouth at bedtime;  #90 x 3;  Signed;  Entered by: Chales Abrahams CMA (AAMA);  Authorized by: Rachael Fee MD;  Method used: [facility] [address] [phone]*, [address], [place], [place], [place]  [address], Ph: [phone] or [phone], Fax: [phone]    Prescriptions: PEPCID 20 MG TABS (FAMOTIDINE) 1 by mouth at bedtime  #90 x 3   Entered by:   Chales Abrahams CMA (AAMA)   Authorized by:   Rachael Fee MD   Signed by:   Chales Abrahams CMA (AAMA) on 06/13/2009   Method used:   [facility]        [facility]  [address] [phone]* (retail)       [address]       [place], [place]  [address]       Ph: [phone] or [phone]       Fax: [phone]   RxID:   [id number] PRILOSEC OTC 20 MG TBEC (OMEPRAZOLE MAGNESIUM) 1 pill by mouth once daily  #90 x 3   Entered by:   Chales Abrahams CMA (AAMA)   Authorized by:   Rachael Fee MD   Signed by:   Chales Abrahams CMA (AAMA) on 06/13/2009   Method used:   [facility]ronically to        [facility] [phone]* (retail)       [address]       [place], [place]  [address]       Ph: [phone] or [phone]       Fax: [phone]   RxID:   [id number]    Appended Document: prilosec    Clinical Lists Changes  Medications: Changed  medication from PRILOSEC OTC 20 MG TBEC (OMEPRAZOLE MAGNESIUM) 1 pill by mouth once daily to PRILOSEC OTC 20 MG TBEC (OMEPRAZOLE MAGNESIUM) 1 pill by mouth two times a day - Signed Rx of PRILOSEC OTC 20 MG TBEC (OMEPRAZOLE MAGNESIUM) 1 pill by mouth two times a day;  #180 x 3;  Signed;  Entered by: Chales Abrahams CMA (AAMA);  Authorized by: Rachael Fee MD;  Method used: [facility] [address] [phone]*, [address],  [place], [place], [place]  [address], Ph: [phone] or [phone], Fax: [phone]    Prescriptions: PRILOSEC OTC 20 MG TBEC (OMEPRAZOLE MAGNESIUM) 1 pill by mouth two times a day  #180 x 3   Entered by:   Chales Abrahams CMA (AAMA)   Authorized by:   Rachael Fee MD   Signed by:   Chales Abrahams CMA (AAMA) on 06/13/2009   Method used:   [facility]ron[facility] to        [facility]  [address] [phone]* (retail)       [address]       [place], [place]  [address]       Ph: [phone] or [phone]       Fax: [phone]   RxID:   [id number]

## 2010-06-19 NOTE — Procedures (Signed)
Summary: Colonoscopy  Patient: Dominique Mooney Note: All result statuses are Final unless otherwise noted.  Tests: (1) Colonoscopy (COL)   COL Colonoscopy           DONE     [facility]     [address]     [place], [place]  [address]           COLONOSCOPY PROCEDURE REPORT     PATIENT:  Dominique, Mooney  MR#:  [id number]     BIRTHDATE:  Jul 19, 1950, 58 yrs. old  GENDER:  female     ENDOSCOPIST:  Rachael Fee, MD           PROCEDURE DATE:  08/28/2009     PROCEDURE:  Diagnostic Colonoscopy     ASA CLASS:  Class II     INDICATIONS:  right sided "twisting" sensation; CT scan, labs all     normal     MEDICATIONS:   Fentanyl 100 mcg IV, Versed 10 mg IV     DESCRIPTION OF PROCEDURE:   After the risks benefits and     alternatives of the procedure were thoroughly explained, informed     consent was obtained.  Digital rectal exam was performed and     revealed no rectal masses.   The LB [id number] endoscope     was introduced through the anus and advanced to the terminal ileum     which was intubated for a short distance, without limitations.     The quality of the prep was excellent, using MoviPrep.  The     instrument was then slowly withdrawn as the colon was fully     examined.     <<PROCEDUREIMAGES>>     FINDINGS:  Mild diverticulosis was found in the sigmoid to     descending colon segments (see image1).  The terminal ileum     appeared normal (see image5).  This was otherwise a normal     examination of the colon (see image3, image2, and image6).     Retroflexed views in the rectum revealed no abnormalities.    The     scope was then withdrawn from the patient and the procedure     completed.     COMPLICATIONS:  None     ENDOSCOPIC IMPRESSION:     1) Mild diverticulosis in the sigmoid to descending colon     segments     2) Normal terminal ileum     3) Otherwise normal examination; no clear explanation for right     sided "twisting" sensation        RECOMMENDATIONS:     1) Continue current colorectal screening recommendations for     "routine risk" patients with a repeat colonoscopy in 10 years.     Will follow clincally. She knows to call [facility] during next     episode of signficant, lasting right sided discomfort ("twistin").     Would arrange for Flat and Upright abd plain films DURING the     symptoms to see if this is from intermitent obstruction.     REPEAT EXAM:  10 years           ______________________________     Rachael Fee, MD           cc: Juline Patch, MD           n.     eSIGNED:   Rachael Fee at 08/28/2009 01:53 PM  Dominique Mooney, Dominique Mooney, [id number]  Note: An exclamation mark (!) indicates a result that was not dispersed into the flowsheet. Document Creation Date: 08/28/2009 1:54 PM _______________________________________________________________________  (1) Order result status: Final Collection or observation date-time: 08/28/2009 13:48 Requested date-time:  Receipt date-time:  Reported date-time:  Referring Physician:   Ordering Physician: Rob Bunting [phone]) Specimen Source:  Source: Launa Grill Order Number: [id number] Lab site:   Appended Document: Colonoscopy    Clinical Lists Changes  Observations: Added new observation of COLONNXTDUE: 08/2019 (08/28/2009 14:05)

## 2010-06-19 NOTE — Progress Notes (Signed)
Summary: refill request   Phone Note Call from Patient   Caller: Patient Call For: Dr. Christella Hartigan Reason for Call: Refill Medication Summary of Call: needs refill of Prilosec and Famotidine, but has changed her pharamcy to [facility] pharmacy... would like to get 3 months at a time Initial call taken by: Vallarie Mare,  January 18, 2010 10:38 AM    Prescriptions: PEPCID 20 MG TABS (FAMOTIDINE) 1 by mouth at bedtime  #90 x 3   Entered by:   Chales Abrahams CMA (AAMA)   Authorized by:   Rachael Fee MD   Signed by:   Chales Abrahams CMA (AAMA) on 01/18/2010   Method used:   Electronically to        [facility]* (retail)       [address]       [address] Shipping/mailing       [place], [place]  [address]       Ph: [phone]       Fax: [phone]   RxID:   [id number] PRILOSEC OTC 20 MG TBEC (OMEPRAZOLE MAGNESIUM) 1 pill by mouth two times a day  #180 x 3   Entered by:   Chales Abrahams CMA (AAMA)   Authorized by:   Rachael Fee MD   Signed by:   Chales Abrahams CMA (AAMA) on 01/18/2010   Method used:   Electronically to        [facility]* (retail)       [address]       [address] Shipping/mailing       [place], [place]  [address]       Ph: [phone]       Fax: [phone]   RxID:   [id number]

## 2010-06-19 NOTE — Miscellaneous (Signed)
Summary: curb side (by patient)  Clinical Lists Changes   she's having some dysphagia again.  Patty please set her up with barium esophagram. also RUQ pain into right flank, and right upper back (unclear if GI related).  patty she also needs cmet, amylase, lipase  Appended Document: Orders Update/BE    Clinical Lists Changes  Problems: Added new problem of RUQ PAIN (ICD-789.01) Added new problem of OTHER DYSPHAGIA (WUX-324.40) Orders: Added new Test order of Barium Swallow (Barium Swallow) - Signed

## 2010-06-19 NOTE — Letter (Signed)
Summary: [facility] Instructions  [facility]  [address] [place], [place] [address]   Phone: [phone]  Fax: [phone]       Dominique Mooney    08-03-1950    MRN: [id number]        Procedure Day /Date:  Monday 08/28/2009     Arrival Time: 12:30 pm      Procedure Time: 1:30 pm     Location of Procedure:                    _ x_   [facility]oscopy Center (4th Floor)                        PREPARATION FOR COLONOSCOPY WITH MOVIPREP   Starting 5 days prior to your procedure Wednesday 4/6 do not eat nuts, seeds, popcorn, corn, beans, peas,  salads, or any raw vegetables.  Do not take any fiber supplements (e.g. Metamucil, Citrucel, and Benefiber).  THE DAY BEFORE YOUR PROCEDURE         DATE: Sunday 4/10  1.  Drink clear liquids the entire day-NO SOLID FOOD  2.  Do not drink anything colored red or purple.  Avoid juices with pulp.  No orange juice.  3.  Drink at least 64 oz. (8 glasses) of fluid/clear liquids during the day to prevent dehydration and help the prep work efficiently.  CLEAR LIQUIDS INCLUDE: Water Jello Ice Popsicles Tea (sugar ok, no milk/cream) Powdered fruit flavored drinks Coffee (sugar ok, no milk/cream) Gatorade Juice: apple, Jonsson grape, Arvidson cranberry  Lemonade Clear bullion, consomm, broth Carbonated beverages (any kind) Strained chicken noodle soup Hard Candy                             4.  In the morning, mix first dose of MoviPrep solution:    Empty 1 Pouch A and 1 Pouch B into the disposable container    Add lukewarm drinking water to the top line of the container. Mix to dissolve    Refrigerate (mixed solution should be used within 24 hrs)  5.  Begin drinking the prep at 5:00 p.m. The MoviPrep container is divided by 4 marks.   Every 15 minutes drink the solution down to the next mark (approximately 8 oz) until the full liter is complete.   6.  Follow completed prep with 16 oz of clear liquid of your choice (Nothing  red or purple).  Continue to drink clear liquids until bedtime.  7.  Before going to bed, mix second dose of MoviPrep solution:    Empty 1 Pouch A and 1 Pouch B into the disposable container    Add lukewarm drinking water to the top line of the container. Mix to dissolve    Refrigerate  THE DAY OF YOUR PROCEDURE      DATE: Monday 4/11 Beginning at 8:30 a.m. (5 hours before procedure):         1. Every 15 minutes, drink the solution down to the next mark (approx 8 oz) until the full liter is complete.  2. Follow completed prep with 16 oz. of clear liquid of your choice.    3. You may drink clear liquids until 11:30 am (2 HOURS BEFORE PROCEDURE).   MEDICATION INSTRUCTIONS  Unless otherwise instructed, you should take regular prescription medications with a small sip of water   as early as possible the morning of your  procedure.           OTHER INSTRUCTIONS  You will need a responsible adult at least 58 years of age to accompany you and drive you home.   This person must remain in the waiting room during your procedure.  Wear loose fitting clothing that is easily removed.  Leave jewelry and other valuables at home.  However, you may wish to bring a book to read or  an iPod/MP3 player to listen to music as you wait for your procedure to start.  Remove all body piercing jewelry and leave at home.  Total time from sign-in until discharge is approximately 2-3 hours.  You should go home directly after your procedure and rest.  You can resume normal activities the  day after your procedure.  The day of your procedure you should not:   Drive   Make legal decisions   Operate machinery   Drink alcohol   Return to work  You will receive specific instructions about eating, activities and medications before you leave.    The above instructions have been reviewed and explained to me by   Dominique Sites RN  August 23, 2009 3:20 PM    I fully understand and can verbalize  these instructions _____________________________ Date _________

## 2010-06-19 NOTE — Miscellaneous (Signed)
Summary: [facility]  Clinical Lists Changes  Medications: Added new medication of MOVIPREP 100 GM  SOLR (PEG-KCL-NACL-NASULF-NA ASC-C) As per prep instructions. - Signed Rx of MOVIPREP 100 GM  SOLR (PEG-KCL-NACL-NASULF-NA ASC-C) As per prep instructions.;  #1 x 0;  Signed;  Entered by: [name] RN;  Authorized by: Rachael Fee MD;  Method used: [facility] [phone]*, [address], [place], [place], [place]  [id number], Ph: [phone] or [phone], Fax: [phone]    Prescriptions: MOVIPREP 100 GM  SOLR (PEG-KCL-NACL-NASULF-NA ASC-C) As per prep instructions.  #1 x 0   Entered by:   [name] RN   Authorized by:   Rachael Fee MD   Signed by:   [name] RN on 08/23/2009   Method used:   [facility]  [address] [phone] (retail)       [address]       [place], [place]  [id number]       Ph: [phone] or [phone]       Fax: [phone]   RxID:   [id number]

## 2010-06-21 NOTE — Letter (Signed)
Summary: Work Writer, Main Office  [address]   [place], [place] [address]   Phone: [phone]  Fax: [phone]     May 28, 2010    [facility]   The above named patient had a medical visit today.  This patient was advised to remain out of work from 05/28/10 -06/01/10.  This patient can return to work on 06/04/10 with no restrictions.   Please take this into consideration when reviewing the time away from work.      Sincerely yours,   Veverly Fells. Excell Seltzer MD [facility]

## 2010-06-21 NOTE — Assessment & Plan Note (Signed)
Summary: np6/SOB  Medications Added AMLODIPINE BESYLATE 5 MG TABS (AMLODIPINE BESYLATE) Take one tablet by mouth daily        Visit Type:  Initial Consult Primary Provider:  Juline Patch, MD  CC:  Palpitations- Sob- hands tingling.  History of Present Illness: 60 year-old woman presents for initial evaluation of shortness of breath and arm tingling. She works at the [employer] in [facility] and approached me last week about concerns over her heart.  The patient complains of chest pressure and palpitations, predominately when resting at night. She denies exertional chest pain or pressure but complains of dyspnea with exertion over the past several weeks. No leg swelling but complains of hand swelling and numbness/pain, especially on the right.  She has no personal history of cardiac disease.  Current Medications (verified): 1)  Symbicort 160-4.5 Mcg/act Aero (Budesonide-Formoterol Fumarate) .... Two Puffs Two Times A Day 2)  Singulair 10 Mg  Tabs (Montelukast Sodium) .... One By Mouth At Bedtime 3)  Claritin 10 Mg  Caps (Loratadine) .... One By Mouth Once Daily As Needed 4)  Nasonex 50 Mcg/act  Susp (Mometasone Furoate) .... 2 Sprays Once Daily 5)  Proventil Hfa 108 (90 Base) Mcg/act  Aers (Albuterol Sulfate) .... 2 Puffs Qid As Needed 6)  Levothroid 100 Mcg  Tabs (Levothyroxine Sodium) .Marland Kitchen.. 1 Once Daily 7)  Crestor 20 Mg Tabs (Rosuvastatin Calcium) .... Once Daily 8)  Daily Multiple Vitamins   Tabs (Multiple Vitamin) .Marland Kitchen.. 1 Once Daily 9)  Omega-3 350 Mg  Caps (Omega-3 Fatty Acids) .Marland Kitchen.. 1 Once Daily 10)  Baby Aspirin 81 Mg  Chew (Aspirin) .Marland Kitchen.. 1 Once Daily 11)  Prilosec Otc 20 Mg Tbec (Omeprazole Magnesium) .Marland Kitchen.. 1 Pill By Mouth Two Times A Day  Allergies: 1)  ! Pcn  Past History:  Past medical, surgical, family and social histories (including risk factors) reviewed, and no changes noted (except as noted below).  Past Medical History: Reviewed history from  10/31/2009 and no changes required. Hypothyroidism Irritable Bowel Syndrome GERD Pancreatitis after ERCP Hyperlipidemia Allergic rhinitis      - RAST 08/02/08 negative, IgE 63.9 Asthma      - PFT 01/05/08 FEV1 2.36 (106%), TLC 4.16 (92%), DLCO 89%, no BD response  Past Surgical History: Reviewed history from 11/25/2007 and no changes required. Cholecystectomy Hernia Surgery Hysterectomy  Salpingo-oophorectomy  Family History: Reviewed history from 11/25/2007 and no changes required. Brothers and Sisters with allergies 2 brothers died of heart attacks at ages 77 and 34 Mother - stroke in her 62's Father - stroke at 21 Son - allergies  Social History: Reviewed history from 11/25/2007 and no changes required. Married, 2 kids Never smoked Employed - [employer] - Admitting  Review of Systems       Right arm tingling down to fingertips, otherwise no complaints. Also positive for headache. Otherwise negative except as per HPI.  Vital Signs:  Patient profile:   60 year old female Height:      61 inches Weight:      209 pounds BMI:     39.63 O2 Sat:      100 % on Room air Pulse rate:   84 / minute Pulse rhythm:   regular Resp:     18 per minute BP sitting:   150 / 93  (left arm) Cuff size:   large  Vitals Entered By: Vikki Ports (May 28, 2010 3:48 PM)  O2 Flow:  Room air  Physical Exam  General:  Pt is well-developed, alert and oriented, no acute distress HEENT: normal Neck: no thyromegaly           JVP normal, carotid upstrokes normal without bruits Lungs: CTA Chest: equal expansion  CV: Apical impulse nondisplaced, RRR without murmur or gallop Abd: soft, NT, positive BS, no HSM, no bruit Back: no CVA tenderness Ext: no clubbing, cyanosis, or edema        femoral pulses 2+ without bruits        pedal pulses 2+ and equal Skin: warm, dry, no rash Neuro: CNII-XII intact,strength 5/5 = b/l    EKG  Procedure date:  05/28/2010  Findings:       NSr 84 bpm, within normal limits.  Impression & Recommendations:  Problem # 1:  CHEST PAIN, PRECORDIAL (ICD-786.51) Chest pain with typical and atypical features. Patient with CV risk factors of family hx premature CAD and hyperlipidemia. Recommend a stress echo to rule out significant myocardial ischemia. Will followup with her after the stress echo is completed.  Her updated medication list for this problem includes:    Baby Aspirin 81 Mg Chew (Aspirin) .Marland Kitchen... 1 once daily    Amlodipine Besylate 5 Mg Tabs (Amlodipine besylate) .Marland Kitchen... Take one tablet by mouth daily  Orders: EKG w/ Interpretation ([address]) Stress Echo (Stress Echo)  Problem # 2:  ESSENTIAL HYPERTENSION, BENIGN (ICD-401.1) Pt anxious today. Continue to follow with Dr Ricki Miller.  Her updated medication list for this problem includes:    Baby Aspirin 81 Mg Chew (Aspirin) .Marland Kitchen... 1 once daily    Amlodipine Besylate 5 Mg Tabs (Amlodipine besylate) .Marland Kitchen... Take one tablet by mouth daily  Orders: EKG w/ Interpretation ([address]) Stress Echo (Stress Echo)  BP today: 150/93 Prior BP: 122/72 (10/31/2009)  Labs Reviewed: K+: 3.8 (04/11/2010) Creat: : 0.92 (04/11/2010)     Patient Instructions: 1)  Your physician has requested that you have a stress echocardiogram. For further information please visit [email or web address]  Please follow instruction sheet as given. 2)  Your physician recommended you to remain out of work.  An out of work note was provided today. 3)  Your physician recommends that you continue on your current medications as directed. Please refer to the Current Medication list given to you today. 4)  Your physician recommends that you schedule a follow-up appointment in: 2-3 WEEKS Prescriptions: AMLODIPINE BESYLATE 5 MG TABS (AMLODIPINE BESYLATE) Take one tablet by mouth daily  #90 x 3   Entered by:   Julieta Gutting, RN, BSN   Authorized by:   Norva Karvonen, MD   Signed by:   Julieta Gutting, RN, BSN on 05/31/2010    Method used:   Electronically to        [facility]* (retail)       [address]       [address]/mail[address]       [place], [place]  [address]       Ph: [phone]       Fax: [phone]   RxID:   [id number]  05/31/10 Dr Excell Seltzer spoke with pt and due to this note being open I have to prescribe Amlodipine 5mg  daily through this note. Rx sent to pharmacy.  Julieta Gutting, RN, BSN  May 31, 2010 6:05 PM

## 2010-06-22 ENCOUNTER — Ambulatory Visit: Admit: 2010-06-22 | Payer: Self-pay | Admitting: Cardiovascular Disease

## 2010-06-22 ENCOUNTER — Ambulatory Visit: Payer: Self-pay | Admitting: Cardiovascular Disease

## 2010-06-23 ENCOUNTER — Inpatient Hospital Stay (INDEPENDENT_AMBULATORY_CARE_PROVIDER_SITE_OTHER)
Admission: RE | Admit: 2010-06-23 | Discharge: 2010-06-23 | Disposition: A | Discharge status: Home / Self Care | Payer: 59 | Source: Ambulatory Visit | Attending: Family Medicine | Admitting: Family Medicine

## 2010-06-23 DIAGNOSIS — J069 Acute upper respiratory infection, unspecified: Secondary | ICD-10-CM

## 2010-07-10 ENCOUNTER — Ambulatory Visit (INDEPENDENT_AMBULATORY_CARE_PROVIDER_SITE_OTHER): Payer: 59 | Admitting: Cardiovascular Disease

## 2010-07-10 ENCOUNTER — Encounter: Payer: Self-pay | Admitting: Cardiovascular Disease

## 2010-07-10 DIAGNOSIS — I10 Essential (primary) hypertension: Secondary | ICD-10-CM

## 2010-07-10 DIAGNOSIS — R0989 Other specified symptoms and signs involving the circulatory and respiratory systems: Secondary | ICD-10-CM

## 2010-07-10 DIAGNOSIS — R0609 Other forms of dyspnea: Secondary | ICD-10-CM

## 2010-07-17 NOTE — Assessment & Plan Note (Signed)
Summary: ok to put in per lauren, aware of block time/per check out/sl   Visit Type:  Follow-up Referring Provider:  Dr. Eldridge Dace Primary Provider:  Juline Patch, MD  CC:  irregular heart beat.  History of Present Illness: 60 year-old woman seen in followup for shortness of breath, arm tingling, palpitations, and elevated blood pressure. She underwent evaluation with a stress echo showing no abnormalities. She was started on amlodipine for HTN and reports good control of her BP. Palpitations and dyspnea have both resolved and she is doing well at present. She denies chest pain or other complaints.  Current Medications (verified): 1)  Symbicort 160-4.5 Mcg/act Aero (Budesonide-Formoterol Fumarate) .... Two Puffs Two Times A Day 2)  Singulair 10 Mg  Tabs (Montelukast Sodium) .... One By Mouth At Bedtime 3)  Claritin 10 Mg  Caps (Loratadine) .... One By Mouth Once Daily As Needed 4)  Nasonex 50 Mcg/act  Susp (Mometasone Furoate) .... 2 Sprays Once Daily 5)  Proventil Hfa 108 (90 Base) Mcg/act  Aers (Albuterol Sulfate) .... 2 Puffs Qid As Needed 6)  Levothroid 100 Mcg  Tabs (Levothyroxine Sodium) .Marland Kitchen.. 1 Once Daily 7)  Crestor 20 Mg Tabs (Rosuvastatin Calcium) .... Once Daily 8)  Daily Multiple Vitamins   Tabs (Multiple Vitamin) .Marland Kitchen.. 1 Once Daily 9)  Omega-3 350 Mg  Caps (Omega-3 Fatty Acids) .Marland Kitchen.. 1 Once Daily 10)  Baby Aspirin 81 Mg  Chew (Aspirin) .Marland Kitchen.. 1 Once Daily 11)  Prilosec Otc 20 Mg Tbec (Omeprazole Magnesium) .Marland Kitchen.. 1 Pill By Mouth Two Times A Day 12)  Amlodipine Besylate 5 Mg Tabs (Amlodipine Besylate) .... Take One Tablet By Mouth Daily  Allergies: 1)  ! Pcn  Past History:  Past medical history reviewed for relevance to current acute and chronic problems.  Past Medical History: Reviewed history from 05/28/2010 and no changes required. Hypothyroidism Irritable Bowel Syndrome GERD Pancreatitis after ERCP Hyperlipidemia Allergic rhinitis      - RAST 08/02/08 negative, IgE  63.9 Asthma      - PFT 01/05/08 FEV1 2.36 (106%), TLC 4.16 (92%), DLCO 89%, no BD response  Review of Systems       Negative except as per HPI   Vital Signs:  Patient profile:   60 year old female Height:      61 inches Weight:      211.50 pounds BMI:     40.11 Pulse rate:   84 / minute Pulse rhythm:   regular Resp:     18 per minute BP sitting:   126 / 80  (left arm) Cuff size:   large  Vitals Entered By: Vikki Ports (July 10, 2010 2:59 PM)  Physical Exam  General:  Pt is well-developed, alert and oriented, no acute distress HEENT: normal Neck: no thyromegaly           JVP normal, carotid upstrokes normal without bruits Lungs: CTA Chest: equal expansion  CV: Apical impulse nondisplaced, RRR without murmur or gallop Abd: soft, NT, positive BS Ext: no clubbing, cyanosis, or edema        pedal pulses 2+ and equal     EKG  Procedure date:  05/31/2010  Findings:      Study Conclusions  - Stress ECG conclusions: There were no stress arrhythmias or   conduction abnormalities. The stress ECG was negative for   ischemia. - Staged echo: There was no echocardiographic evidence for   stress-induced ischemia.  EKG  Procedure date:  07/10/2010  Findings:  NSR 84 bpm, within normal limits.  Impression & Recommendations:  Problem # 1:  CHEST PAIN, PRECORDIAL (ICD-786.51) Symptoms have resolved and stress echo was normal - pt reassured. Suspect her symptoms were related to work stress. Pt will followup with Dr Ricki Miller and I would be happy to see her anytime on an as needed basis.  Her updated medication list for this problem includes:    Baby Aspirin 81 Mg Chew (Aspirin) .Marland Kitchen... 1 once daily    Amlodipine Besylate 5 Mg Tabs (Amlodipine besylate) .Marland Kitchen... Take one tablet by mouth daily  Orders: EKG w/ Interpretation ([address])  Problem # 2:  ESSENTIAL HYPERTENSION, BENIGN (ICD-401.1) BP controlled.  Her updated medication list for this problem includes:    Baby  Aspirin 81 Mg Chew (Aspirin) .Marland Kitchen... 1 once daily    Amlodipine Besylate 5 Mg Tabs (Amlodipine besylate) .Marland Kitchen... Take one tablet by mouth daily  BP today: 126/80 Prior BP: 150/93 (05/28/2010)  Labs Reviewed: K+: 3.8 (04/11/2010) Creat: : 0.92 (04/11/2010)     Patient Instructions: 1)  Your physician recommends that you schedule a follow-up appointment as needed 2)  Your physician recommends that you continue on your current medications as directed. Please refer to the Current Medication list given to you today.

## 2010-07-23 ENCOUNTER — Other Ambulatory Visit: Payer: Self-pay | Admitting: Pulmonary Disease

## 2010-07-23 ENCOUNTER — Encounter: Payer: Self-pay | Admitting: Adult Health

## 2010-07-23 ENCOUNTER — Other Ambulatory Visit: Payer: Self-pay | Admitting: Adult Health

## 2010-07-23 ENCOUNTER — Other Ambulatory Visit: Payer: 59

## 2010-07-23 ENCOUNTER — Ambulatory Visit (INDEPENDENT_AMBULATORY_CARE_PROVIDER_SITE_OTHER)
Admission: RE | Admit: 2010-07-23 | Discharge: 2010-07-23 | Disposition: A | Discharge status: Home / Self Care | Payer: 59 | Source: Ambulatory Visit | Attending: Pulmonary Disease | Admitting: Pulmonary Disease

## 2010-07-23 ENCOUNTER — Ambulatory Visit (INDEPENDENT_AMBULATORY_CARE_PROVIDER_SITE_OTHER): Payer: 59 | Admitting: Adult Health

## 2010-07-23 DIAGNOSIS — M549 Dorsalgia, unspecified: Secondary | ICD-10-CM | POA: Insufficient documentation

## 2010-07-23 DIAGNOSIS — R072 Precordial pain: Secondary | ICD-10-CM

## 2010-07-23 LAB — URINALYSIS, ROUTINE W REFLEX MICROSCOPIC
Bilirubin Urine: NEGATIVE
Hgb urine dipstick: NEGATIVE
Total Protein, Urine: NEGATIVE
Urine Glucose: NEGATIVE
Urobilinogen, UA: 0.2 (ref 0.0–1.0)

## 2010-07-24 ENCOUNTER — Encounter: Payer: Self-pay | Admitting: Adult Health

## 2010-07-31 NOTE — Assessment & Plan Note (Signed)
Summary: Acute NP office visit - asthma   Copy to:  Dr. Eldridge Dace Primary Provider/Referring Provider:  Juline Patch, MD  CC:  right mid back pain x3days and worse x1day.  History of Present Illness: 60 yo with known hx of  chronic cough in the setting of asthma and rhinitis with post-nasal drip.  July 23, 2010 --Presents for an acute office visit. Complains of right mid back pain,  and some soreness n the center of her chest x3days, worse x1day. Very sore to touch and with turning. Pain has caught her breath at times. Feels a tightness in chest at times. No knonw injury . NO exertional chest pain, associated dyspena, palpitations, syncope. Denies chest pain,  orthopnea, hemoptysis, fever, n/v/d, edema, headache, urinary symptoms.   Medications Prior to Update: 1)  Symbicort 160-4.5 Mcg/act Aero (Budesonide-Formoterol Fumarate) .... Two Puffs Two Times A Day 2)  Singulair 10 Mg  Tabs (Montelukast Sodium) .... One By Mouth At Bedtime 3)  Claritin 10 Mg  Caps (Loratadine) .... One By Mouth Once Daily As Needed 4)  Nasonex 50 Mcg/act  Susp (Mometasone Furoate) .... 2 Sprays Once Daily 5)  Proventil Hfa 108 (90 Base) Mcg/act  Aers (Albuterol Sulfate) .... 2 Puffs Qid As Needed 6)  Levothroid 100 Mcg  Tabs (Levothyroxine Sodium) .Marland Kitchen.. 1 Once Daily 7)  Crestor 20 Mg Tabs (Rosuvastatin Calcium) .... Once Daily 8)  Daily Multiple Vitamins   Tabs (Multiple Vitamin) .Marland Kitchen.. 1 Once Daily 9)  Omega-3 350 Mg  Caps (Omega-3 Fatty Acids) .Marland Kitchen.. 1 Once Daily 10)  Baby Aspirin 81 Mg  Chew (Aspirin) .Marland Kitchen.. 1 Once Daily 11)  Prilosec Otc 20 Mg Tbec (Omeprazole Magnesium) .Marland Kitchen.. 1 Pill By Mouth Two Times A Day 12)  Amlodipine Besylate 5 Mg Tabs (Amlodipine Besylate) .... Take One Tablet By Mouth Daily  Current Medications (verified): 1)  Symbicort 160-4.5 Mcg/act Aero (Budesonide-Formoterol Fumarate) .... Two Puffs Two Times A Day 2)  Singulair 10 Mg  Tabs (Montelukast Sodium) .... One By Mouth At Bedtime 3)   Claritin 10 Mg  Caps (Loratadine) .... One By Mouth Once Daily As Needed 4)  Nasonex 50 Mcg/act  Susp (Mometasone Furoate) .... 2 Sprays Once Daily 5)  Proventil Hfa 108 (90 Base) Mcg/act  Aers (Albuterol Sulfate) .... 2 Puffs Qid As Needed 6)  Levothroid 100 Mcg  Tabs (Levothyroxine Sodium) .Marland Kitchen.. 1 Once Daily 7)  Crestor 20 Mg Tabs (Rosuvastatin Calcium) .... Once Daily 8)  Daily Multiple Vitamins   Tabs (Multiple Vitamin) .Marland Kitchen.. 1 Once Daily 9)  Omega-3 350 Mg  Caps (Omega-3 Fatty Acids) .Marland Kitchen.. 1 Once Daily 10)  Baby Aspirin 81 Mg  Chew (Aspirin) .Marland Kitchen.. 1 Once Daily 11)  Prilosec Otc 20 Mg Tbec (Omeprazole Magnesium) .Marland Kitchen.. 1 Pill By Mouth Two Times A Day 12)  Amlodipine Besylate 5 Mg Tabs (Amlodipine Besylate) .... Take One Tablet By Mouth Daily  Allergies (verified): 1)  ! Pcn  Past History:  Past Medical History: Last updated: 05/28/2010 Hypothyroidism Irritable Bowel Syndrome GERD Pancreatitis after ERCP Hyperlipidemia Allergic rhinitis      - RAST 08/02/08 negative, IgE 63.9 Asthma      - PFT 01/05/08 FEV1 2.36 (106%), TLC 4.16 (92%), DLCO 89%, no BD response  Past Surgical History: Last updated: 05/28/2010 Cholecystectomy Hernia Surgery Hysterectomy  Salpingo-oophorectomy  Family History: Last updated: 05/28/2010 Brothers and Sisters with allergies 2 brothers died of heart attacks at ages 47 and 68 Mother - stroke in her 45's Father - stroke at  77 Son - allergies  Social History: Last updated: 05/28/2010 Married, 2 kids Never smoked Employed - [employer] - Admitting  Risk Factors: Smoking Status: never (11/25/2007)  Review of Systems      See HPI  Vital Signs:  Patient profile:   60 year old female Height:      61 inches Weight:      213.25 pounds BMI:     40.44 O2 Sat:      98 % on Room air Temp:     98.2 degrees F oral Pulse rate:   82 / minute BP sitting:   126 / 74  (left arm) Cuff size:   large  Vitals Entered By: Boone Master CNA/MA  (July 23, 2010 3:53 PM)  O2 Flow:  Room air CC: right mid back pain x3days, worse x1day Is Patient Diabetic? No Comments Medications reviewed with patient Daytime contact number verified with patient. Boone Master CNA/MA  July 23, 2010 3:54 PM    Physical Exam  Additional Exam:  GEN: A/Ox3; pleasant , NAD HEENT:  [place]/AT, , EACs-clear, TMs-wnl, NOSE-clear, THROAT-clear NECK:  Supple w/ fair ROM; no JVD; normal carotid impulses w/o bruits; no thyromegaly or nodules palpated; no lymphadenopathy. RESP  Clear to P & A; w/o, wheezes/ rales/ or rhonchi. CARD:  RRR, no m/r/g   GI:   Soft & nt; nml bowel sounds; no organomegaly or masses detected., no guarding or rebound, no bruits noted.  Musco: Warm bil,  no calf tenderness edema, clubbing, pulses intact, tender along right lateral and post back, no rash, nml grips, equal strength of upper ext.   skin: no rash noted.    Impression & Recommendations:  Problem # 1:  BACK PAIN (ICD-724.5) ? Musculoskeletal in natue  will check urine today ? uti vs kidney stone r/o  check xray -chest  Plan:   Alternate ice and heat to back  Advil 200mg  3 tabs two times a day w/ food for 5 days  Skelaxin 800mg  three times a day as needed muscle spasm  Vicodin 1 by mouth every 4-6 hrs as needed pain --may make you sleepy  Please contact office for sooner follow up if symptoms do not improve or worsen   Orders: T-Urine Culture (Spectrum Order) [id number]) TLB-Udip w/ Micro (81001-URINE) Est. Patient Level IV (86578)  Medications Added to Medication List This Visit: 1)  Skelaxin 800 Mg Tabs (Metaxalone) .Marland Kitchen.. 1 by mouth three times a day as needed muscle spasm 2)  Vicodin 5-500 Mg Tabs (Hydrocodone-acetaminophen) .Marland Kitchen.. 1-2 by mouth every 4-6 hr as needed pain  Other Orders: T-2 View CXR (71020TC)  Patient Instructions: 1)  Alternate ice and heat to back  2)  Advil 200mg  3 tabs two times a day w/ food for 5 days  3)  Skelaxin 800mg  three times a  day as needed muscle spasm  4)  Vicodin 1 by mouth every 4-6 hrs as needed pain --may make you sleepy  5)  Please contact office for sooner follow up if symptoms do not improve or worsen  Prescriptions: VICODIN 5-500 MG TABS (HYDROCODONE-ACETAMINOPHEN) 1-2 by mouth every 4-6 hr as needed pain  #20 x 0   Entered and Authorized by:   Rubye Oaks NP   Signed by:   Docia Klar NP on 07/23/2010   Method used:   Print then Give to Patient   RxID:   [id number] SKELAXIN 800 MG TABS (METAXALONE) 1 by mouth three times a day as needed  muscle spasm  #30 x 0   Entered and Authorized by:   Rubye Oaks NP   Signed by:   Rubye Oaks NP on 07/23/2010   Method used:   Electronically to        [facility] [phone]* (retail)       [address]       [place], [place]  [id number]       Ph: [phone] or [phone]       Fax: [phone]   RxID:   [id number]    Immunization History:  Influenza Immunization History:    Influenza:  historical (01/18/2010)   Appended Document: Acute NP office visit - asthma ekg reviewed and w/ no acute change recent eval with cards  cont to follow up as planed and as needed

## 2010-10-02 ENCOUNTER — Encounter: Payer: Self-pay | Admitting: Adult Health

## 2010-10-02 ENCOUNTER — Ambulatory Visit (INDEPENDENT_AMBULATORY_CARE_PROVIDER_SITE_OTHER): Payer: 59 | Admitting: Adult Health

## 2010-10-02 VITALS — BP 116/82 | HR 82 | Temp 97.2°F | Ht 61.0 in | Wt 211.2 lb

## 2010-10-02 DIAGNOSIS — J309 Allergic rhinitis, unspecified: Secondary | ICD-10-CM

## 2010-10-02 DIAGNOSIS — R05 Cough: Secondary | ICD-10-CM

## 2010-10-02 MED ORDER — HYDROCODONE-HOMATROPINE 5-1.5 MG/5ML PO SYRP: 5.0000 mL | ORAL_SOLUTION | Freq: Four times a day (QID) | ORAL | Status: AC | PRN

## 2010-10-02 MED ORDER — AZITHROMYCIN 250 MG PO TABS: 250.0000 mg | ORAL_TABLET | Freq: Every day | ORAL | Status: DC

## 2010-10-02 MED ORDER — LEVALBUTEROL HCL 0.63 MG/3ML IN NEBU
0.6300 mg | INHALATION_SOLUTION | Freq: Once | RESPIRATORY_TRACT | Status: AC
  Administered 2010-10-02: 0.63 mg via RESPIRATORY_TRACT

## 2010-10-02 NOTE — Procedures (Signed)
NAME:  Dominique Mooney, Dominique Mooney                ACCOUNT NO.:  [id number]   MEDICAL RECORD NO.:  [id number]          PATIENT TYPE:  OUT   LOCATION:  SLEEP CENTER                 FACILITY:  [facility]   PHYSICIAN:  Clinton D. Maple Hudson, MD, FCCP, FACPDATE OF BIRTH:  1950-06-07   DATE OF STUDY:  05/29/2007                            NOCTURNAL POLYSOMNOGRAM   REFERRING PHYSICIAN:  Deirdre Peer. Polite, M.D.   INDICATION FOR STUDY:  Hypersomnia with sleep apnea.   EPWORTH SLEEPINESS SCORE:  13/24, BMI 37.8, weight 200 pounds, height 61  inches, neck 16 inches.   HOME MEDICATIONS:  Charted and reviewed.   SLEEP ARCHITECTURE:  Total sleep time 337 minutes with sleep efficiency  80.8%.  Stage I was 5%, stage II 82.2%, stage III absent, REM 12.3% of  total sleep time.  Sleep latency 11.5 minutes.  REM latency 257 minutes.  Awake after sleep onset 68 minutes.  Arousal index 4.3.  No bedtime  medication was taken.   RESPIRATORY DATA:  Apnea/hypopnea index (AHI) 2.5 per hours which is  within normal limits (normal range 0-5).  There were 14 respiratory  events including 3 obstructive apenas, 1 central apnea, and 10  hypopneas.  Events were non-positional.  There were insufficient events  to permit CPAP titration by split protocol on the study night.   OXYGEN DATA:  Moderate snoring with oxygen desaturation to a nadir of  87%.  Mean oxygen saturation through the study was 94.7% on room air.   CARDIAC DATA:  Normal sinus rhythm.   MOVEMENT-PARASOMNIA:  No significant movement disturbance.  Bathroom x1.   IMPRESSIONS-RECOMMENDATIONS:  1. Occasional respiratory events scored as hypopneas, averaging 2.5      per hour (apnea/hypopnea index 2.5) which is within normal limits.      Normal range is 0-5 per hour.  Moderate snoring with oxygen      desaturation to a nadir of 87%.  2. There were insufficient events to qualify for CPAP titration by      split protocol on this study.  Consider emphasis      on weight loss  with encouragement to sleep off flat back and      treatment for any significant nasal or upper airway obstruction as      appropriate.      Clinton D. Maple Hudson, MD, [facility], FACP  Diplomate, Biomedical engineer of Sleep Medicine  Electronically Signed     CDY/MEDQ  D:  06/07/2007 15:23:02  T:  06/07/2007 16:04:58  Job:  [id number]

## 2010-10-02 NOTE — Patient Instructions (Signed)
Zpack take as directed.  Mucinex DM Twice daily  As needed  Cough/congestion  Saline nasal rinses As needed   Change claritin to Zyrtec 10mg  At bedtime   follow up Dr. Craige Cotta  In 4 weeks  Please contact office for sooner follow up if symptoms do not improve or worsen or seek emergency care  Hydromet 1-2 tsp every 4-6 hr As needed  Cough may make you sleepy.

## 2010-10-02 NOTE — Progress Notes (Signed)
Addended by: Vernie Murders on: 10/02/2010 03:50 PM   Modules accepted: Orders

## 2010-10-02 NOTE — Assessment & Plan Note (Addendum)
Flare with URI  xopenex neb given   Plan Zpack take as directed.  Mucinex DM Twice daily  As needed  Cough/congestion  Saline nasal rinses As needed   Change claritin to Zyrtec 10mg  At bedtime   follow up Dr. Craige Cotta  In 4 weeks  Please contact office for sooner follow up if symptoms do not improve or worsen or seek emergency care  Hydromet 1-2 tsp every 4-6 hr As needed  Cough may make you sleepy.

## 2010-10-02 NOTE — Assessment & Plan Note (Signed)
[facility]                         GASTROENTEROLOGY OFFICE NOTE   NAME:Dominique Mooney, Dominique Mooney                       MRN:          [id number]  DATE:08/10/2007                            DOB:          1951-04-20    She was self-referred.   PRIMARY CARE PHYSICIAN:  Dr. Nehemiah Settle.   CHIEF COMPLAINT:  Abdominal discomfort.  Intermittent dysphagia.   HISTORY OF PRESENT ILLNESS:  Ms. Wherley is a very pleasant 60 year old  woman who works at [employer] over at [employer].  She has a very  distant history of post-ERCP pancreatitis that occurred following an  ERCP by Dr. Lucianne Lei.  She also was followed by Dr. Cheryln Manly  here at [facility] for, I believe, several years for intermittent abdominal  pain.  He repeated an ERCP at one point and did a pancreatogram and  determined that she did not have an abnormal response to secretin  challenge.  She has not been seen here for at least 10-12 years.  For  the past several months at least, she has had myriad upper and lower GI  complaints.  She has intermittent lower abdominal cramping that does  seem to be relieved when she moves her bowels.  She has alternating  bowel habits.  This has been going on for several years at least.  She  describes loose stools followed by several days of no stool.  She has  not seen any blood in her stool.  She does not have any classic GERD  symptoms of pyrosis or acid taste in her mouth, but she does have  postprandial nausea fairly regularly, as well as some intermittent non-  progressive solid food dysphagia.  She has tried MiraLax and does not  believe that helps her alternating bowel habits.  She is currently  having trouble adjusting her Synthroid dose, but is working with Dr.  Talmage Nap for that.   REVIEW OF SYSTEMS:  Notable for a 70-pound weight gain in the past 2  years.  Otherwise, essentially normal and noted on our nursing intake  sheet.   PAST MEDICAL HISTORY:   Hypothyroidism.  Elevated cholesterol.  Status  post lap-chole in the year 2000.  Hernia surgery in 1989, hysterectomy  1976, post ERCP pancreatitis as above.   CURRENT MEDICATIONS:  Levothyroxine.  Lipitor.  Multivitamin.  Omega 3  fatty acids.  Aspirin.   ALLERGIES:  PENICILLIN gives her a rash.   SOCIAL HISTORY:  Married with 2 children.  Works at [employer]-  Stay as above.  Nonsmoker.  Nondrinker.  Does not drink caffeine.   FAMILY HISTORY:  No colon cancer or colon polyps.   PHYSICAL EXAM:  Height 5 feet 1 inches, 208 pounds.  Blood pressure  112/72.  CONSTITUTIONAL:  Obese.  Otherwise, well-appearing.  NEUROLOGIC:  Alert and oriented x3.  EYES:  Extraocular movements intact.  MOUTH:  Oropharynx moist, no lesions.  NECK:  Supple.  No lymphadenopathy.  CARDIOVASCULAR:  Heart is regular rate and rhythm.  LUNGS:  Clear to auscultation bilaterally.  ABDOMEN:  Soft, nontender, nondistended.  Normal bowel sounds.  EXTREMITIES:  No lower extremity edema.  SKIN:  No rash or lesions on the visible extremities.   ASSESSMENT AND PLAN:  A 59 year old woman with upper and lower  gastrointestinal complaints.   First, she does have some postprandial nausea and some mild  intermittent, nonprogressive solid food dysphagia.  For that, we should  proceed with endoscopy at her soonest convenience.  We have also given  her samples of Protonix on the chance that this is possibly acid-  related.  I have instructed her to take 1 pill once a day 20-30 minutes  prior to her breakfast meal, as that is the way the pill is designed to  work most effectively.   She has rather classic alternating bowel habits and I suspect she will  respond to fiber supplements.  She will begin a trial of Citrucel powder  slowly titrated up to 1 full scoop a day.  Also, I am going to get a  basic set of labs to include a CBC, complete metabolic panel, tTG and an  IgA level.  Lastly, she is over 50, and given  her alternating habits,  she would also be indicated for a full colonoscopy, which will be  performed at the same time as her upper endoscopy.     Rachael Fee, MD  Electronically Signed    DPJ/MedQ  DD: 08/10/2007  DT: 08/10/2007  Job #: [id number]   cc:   Deirdre Peer. Polite, M.D.

## 2010-10-02 NOTE — Progress Notes (Signed)
  Subjective:    Patient ID: Dominique Mooney, female    DOB: Mar 24, 1951, 60 y.o.   MRN: [id number]  HPI 50 with known hx of chronic cough and rhinitis   10/02/10 Acute OV  Pt presents for work in visit. Complains of prod cough with light yellow mucus, wheezing, increased SOB, hoarsenss, sinus pressure/congestion, PND x2days. OTC not helpings. Has sinus pain and pressure. Cough is getting worse. Has some wheezing this am.  Last cxr 08/02/10 w/ no acute process. She takes claritin daily but does not feel it is working last few week with lots of sinus drainage in throat.    Review of Systems Constitutional:   No  weight loss, night sweats,  Fevers, chills, fatigue, or  lassitude.  HEENT:   No headaches,  Difficulty swallowing,  Tooth/dental problems, or  Sore throat,                + sneezing, itching, ear ache, nasal congestion, post nasal drip,   CV:  No chest pain,  Orthopnea, PND, swelling in lower extremities, anasarca, dizziness, palpitations, syncope.   GI  No heartburn, indigestion, abdominal pain, nausea, vomiting, diarrhea, change in bowel habits, loss of appetite, bloody stools.   Resp: No coughing up of blood.  No change in color of mucus. No chest wall deformity  Skin: no rash or lesions.  GU: no dysuria, change in color of urine, no urgency or frequency.  No flank pain, no hematuria   MS:  No joint pain or swelling.  No decreased range of motion.  No back pain.  Psych:  No change in mood or affect. No depression or anxiety.  No memory loss.         Objective:   Physical Exam GEN: A/Ox3; pleasant , NAD,   HEENT:  [place]/AT,  EACs-clear, TMs-wnl, NOSE-clear drainage, max sinus  tender THROAT-clear, no lesions, no postnasal drip or exudate noted.   NECK:  Supple w/ fair ROM; no JVD; normal carotid impulses w/o bruits; no thyromegaly or nodules palpated; no lymphadenopathy.  RESP  Clear  P & A; w/o, wheezes/ rales/ or rhonchi.no accessory muscle use, no dullness to  percussion  CARD:  RRR, no m/r/g  , no peripheral edema, pulses intact, no cyanosis or clubbing.  GI:   Soft & nt; nml bowel sounds; no organomegaly or masses detected.  Musco: Warm bil, no deformities or joint swelling noted.   Neuro: alert, no focal deficits noted.    Skin: Warm, no lesions or rashes         Assessment & Plan:

## 2010-10-05 NOTE — Op Note (Signed)
NAME:  Dominique Mooney, Dominique Mooney                ACCOUNT NO.:  [id number]   MEDICAL RECORD NO.:  [id number]          PATIENT TYPE:  AMB   LOCATION:  ENDO                         FACILITY:  [facility]   PHYSICIAN:  Petra Kuba, M.D.    DATE OF BIRTH:  09-13-1950   DATE OF PROCEDURE:  10/02/2004  DATE OF DISCHARGE:                                 OPERATIVE REPORT   PROCEDURE PERFORMED:  Colonoscopy.   ENDOSCOPIST:  Petra Kuba, M.D.   INDICATIONS FOR PROCEDURE:  Patient with abdominal pain, abnormal CAT scan.  Consent was signed after the risks, benefits, methods and options were  thoroughly discussed in the office on multiple occasions.   MEDICINES USED:  Demerol 100 mg, Versed 10 mg.   DESCRIPTION OF PROCEDURE:  Rectal inspection was pertinent for external  hemorrhoids, small.  Digital exam was negative.  A video pediatric  adjustable colonoscope was inserted and with abdominal pressure, able to be  advanced around to the cecum.  No abnormality was seen on insertion.  No  position changes were needed.  The cecum was identified by the appendiceal  orifice and the ileocecal valve. In fact the scope was inserted a short ways  into the terminal ileum, which was normal.  Photodocumentation was obtained.  The scope was slowly withdrawn.  The prep was adequate.  There was some  liquid stool that required washing and suctioning.  On slow withdrawal  through the colon, no abnormalities were seen.  Specifically, no polyps,  tumors, masses or diverticula.  Once back in the rectum, anorectal pull-  through and retroflexion confirmed some small hemorrhoids.  Scope was  straightened and readvanced a short ways up the left side of the colon, air  was suctioned, scope removed.  The patient tolerated the procedure well.  There was no immediate obvious complication.   ENDOSCOPIC DIAGNOSIS:  1.  Internal and external small hemorrhoids.  2.  Otherwise within normal limits to the terminal ileum.   PLAN:  Recheck  colon screening in five years, happy to see back p.r.n.  Okay  for gynecologic surgery from my standpoint.      MEM/MEDQ  D:  10/02/2004  T:  10/02/2004  Job:  [id number]   cc:   Dorisann Frames, M.D.  [place] [place] [address]  Fax: [phone]   Janine Limbo, M.D.  [address]  [place]  [place] [address]  Fax: [phone]   Talmadge Coventry, M.D.  [address]  [place]  [place] [address]  Fax: [phone]   Artist Pais, M.D.  [address], [address]  [place]  [place] [address]  Fax: [phone]

## 2010-10-05 NOTE — Op Note (Signed)
   NAME:  Dominique Mooney, Dominique Mooney                          ACCOUNT NO.:  [id number]   MEDICAL RECORD NO.:  [id number]                   PATIENT TYPE:  AMB   LOCATION:  ENDO                                 [facility]ACILITY[facility]  [facility]   PHYSICIAN:  Petra Kuba, M.D.                 DATE OF BIRTH:  26-Aug-1950   DATE OF PROCEDURE:  06/25/2002  DATE OF DISCHARGE:                                 OPERATIVE REPORT   PROCEDURE:  Colonoscopy.   INDICATION:  Abdominal pain, constipation, weight loss.  Due for colonic  screening.  Consent was signed after risks, benefits, methods, options  thoroughly discussed in the office.   MEDICINES USED:  Demerol 75, Versed 6.   DESCRIPTION OF PROCEDURE:  Rectal inspection was pertinent for external  hemorrhoids.  Digital exam was negative.  The video pediatric adjustable  colonoscope was inserted and with some abdominal pressure, advanced around  the colon to the cecum.  This did not require any position changes.  No  abnormalities were seen.  The cecum was identified by the appendiceal  orifice and the ileocecal valve.  In fact, the scope was inserted a short  ways into the terminal ileum which was normal.  Photodocumentation was  obtained.  The scope was slowly withdrawn.  Prep was adequate.  There was  some liquid stool that required washing and suctioning but on slow  withdrawal through the colon, no abnormalities were seen.  Once back in the  rectum, the scope was retroflexed, pertinet for some small internal  hemorrhoids.  The scope was straightened and readvanced a short ways up the  left side of the colon; air was suctioned and scope removed.  The patient  tolerated the procedure well.  There was no obvious immediate complication.   ENDOSCOPIC DIAGNOSES:  1. Internal-external hemorrhoids.  2. Otherwise within normal limits to the terminal ileum.    PLAN:  1. Yearly rectals and guaiacs per Armstead Peaks, M.D.  2. Repeat screening in 5-10 years.  3. Continue  work-up with an EGD.  Please see that dictation for other     recommendations and suggestions.                                               Petra Kuba, M.D.    MEM/MEDQ  D:  06/25/2002  T:  06/25/2002  Job:  [id number]   cc:   Armstead Peaks, M.D.  [address] [place], [place] [address]  Fax: [phone]   Dorisann Frames, M.D.  [place] [place] [address]  Fax: [phone]

## 2010-10-05 NOTE — Procedures (Signed)
. [facility]  Patient:    Dominique Mooney, Dominique Mooney                       MRN: [id number] Proc. Date: 12/12/99 Adm. Date:  [id number] Disc. Date: [id number] Attending:  Charna Elizabeth CC:         Merlene Laughter. Renae Gloss, M.D.                           Procedure Report  DATE OF BIRTH:  Oct 21, 1950  PROCEDURE PERFORMED:  Esophagogastroduodenoscopy.  ENDOSCOPIST:  Anselmo Rod, M.D.  INSTRUMENT USED:  Olympus video panendoscope.  INDICATIONS:  Epigastric and right upper quadrant pain in a 60 year old black female, rule out peptic ulcer disease, esophagitis, gastritis, etc.  PREPROCEDURE PREPARATION:  Informed consent was procured from the patient. The patient was fasted for eight hours prior to the procedure.  PREPROCEDURE PHYSICAL EXAMINATION:  VITAL SIGNS:  Stable.  NECK:  Supple.  CHEST:  Clear to auscultation.  S1 and S2 regular.  ABDOMEN:  Soft with epigastric tenderness on palpation with guarding, no rebound, rigidity or hepatosplenomegaly.  DESCRIPTION OF PROCEDURE:  The patient was placed in the left lateral decubitus position and sedated with Demerol and Versed for the colonoscopy. No additional sedation was used for the EGD.  Once the patient was adequately positioned, the Olympus video panendoscope was advanced through the mouthpiece over the tongue into the esophagus under direct vision.  The entire esophagus appeared normal without evidence of rings, strictures, mass, lesions, or esophagitis.  The scope was then advanced in the stomach.  A small hiatal hernia was seen on high retroflexion.  The rest of the gastric mucosa appeared healthy with no evidence of erosion, ulcers, masses, or polyps.  The duodenal bulb and the small bowel distal to the bulb appeared normal.  There was no outlet obstruction.  The patient tolerated the procedure well without complication.  IMPRESSION:  Normal EGD except for small hiatal  hernia.  RECOMMENDATIONS: 1. Continue Reglan for now. 2. Avoid all nonsteroidals. 3. Outpatient follow up in the next two weeks. DD:  12/12/99 TD:  12/13/99 Job: [id number]

## 2010-10-05 NOTE — Procedures (Signed)
[place]. [facility]  Patient:    Dominique Mooney, Dominique Mooney                      MRN: [id number]. Date: 12/12/99 Attending:  Anselmo Rod, M.D. CC:         Merlene Laughter. Renae Gloss, M.D.                           Procedure Report  DATE OF BIRTH:  04/27/1951  REFERRING PHYSICIAN:  Dr. Andi Devon.  PROCEDURE PERFORMED:  Colonoscopy.  ENDOSCOPIST:  Anselmo Rod, M.D.  INSTRUMENT USED:  Olympus video colonoscope.  INDICATIONS FOR PROCEDURE:  Rectal bleeding in a 60 year old black female with ongoing problems with abdominal pain.  Rule out polyps, masses, hemorrhoids, etc.  PREPROCEDURE PREPARATION:  Informed consent was obtained from the patient. The patient was fasted for eight hours prior to the procedure, and prepped with a bottle of magnesium citrate and a gallon of NuLytely the night prior to the procedure.  PREPROCEDURE PHYSICAL EXAMINATION:  VITAL SIGNS:  Stable.  NECK:  Supple.  CHEST:  Clear to auscultation.  S1 and S2 regular.  ABDOMEN:  Soft with normal abdominal bowel sounds.  DESCRIPTION OF PROCEDURE:  The patient was placed in the left lateral decubitus position and sedated with 100 mg of Demerol and 10 mg of Versed intravenously.  Once the patient was adequately sedated and maintained on low flow oxygen and continuous cardiac monitoring, the Olympus video colonoscope was advanced from the rectum to the cecum without difficulty.  The patient had a fairly good prep.  The entire colonic mucosa appeared healthy with a normal vascular pattern.  No erosions, ulcerations, masses, or polyps were present. The patient had small internal hemorrhoids on retroflexion, and tolerated the procedure well without complication.  IMPRESSION:  Normal colonoscopy except for small non-bleeding internal hemorrhoid.  RECOMMENDATIONS:  Proceed with EGD at this time. DD:  12/12/99 TD:  12/13/99 Job: [id number]

## 2010-10-05 NOTE — Consult Note (Signed)
NAME:  Dominique Mooney, Dominique Mooney NO.:  [id number]   MEDICAL RECORD NO.:  [id number]          PATIENT TYPE:  EMS   LOCATION:  MAJO                         FACILITY:  [facility]   PHYSICIAN:  Corky Crafts, MDDATE OF BIRTH:  1951-04-01   DATE OF CONSULTATION:  09/02/2006  DATE OF DISCHARGE:                                 CONSULTATION   REFERRING PHYSICIAN:  Dr. Olena Leatherwood.   CHIEF COMPLAINT:  Chest pain.   HISTORY OF PRESENT ILLNESS:  The patient is a 60-year African-American  female with no known history of coronary artery disease who reported a  pressure in the center of the chest that started this morning at about  6:30 a.m. There was some shortness of breath but no other associated  symptoms. The pain radiated to her back. She came to the emergency room  and was found to have a normal EKG.  The pain has since resolved  significantly after receiving a GI cocktail.  About a week ago, she had  some right shoulder and right arm numbness and tingling. She has not had  any of that today. She did take a Zegerid this morning, and this seemed  to make her symptoms worse. Currently her the pain is much relieved.   PAST MEDICAL HISTORY:  1. High cholesterol.  2. Hypothyroid.  3. GERD.  4. History of recent normal Cardiolite stress test.   PAST SURGICAL HISTORY:  1. Hysterectomy.  2. Hernia repair.  3. Knee surgery.  4. Cholecystectomy.   ALLERGIES:  PENICILLIN.   MEDICATIONS:  1. Zegerid 40 mg a day.  2. Synthroid 25 mcg day.  3. Aspirin 81 mg daily.  4. Lipitor.   FAMILY HISTORY:  Early CAD in a brother.   SOCIAL HISTORY:  She is married, lives with husband.  She works at [employer]. She does not smoke, drink or use any drugs.   REVIEW OF SYSTEMS:  No recent fevers, chills. No weight loss.  No focal  deficits.  No rash.  No nausea, vomiting.  All other systems negative.   PHYSICAL EXAMINATION:  VITAL SIGNS:  Blood pressure 143/70, pulse 71.  GENERAL:  She is  awake, alert, no apparent stress.  HEAD:  Normocephalic, atraumatic.  EYES:  Extraocular movements intact.  NECK:  No carotid bruits.  No JVD.  CARDIOVASCULAR:  Regular rate and rhythm, S1-S2.  CHEST WALL:  There is tenderness to palpation in the mid sternal which  reproduces her chest pain.  LUNGS:  Clear to auscultation bilaterally.  ABDOMEN:  Soft, nontender, nondistended.  EXTREMITIES:  Showed no edema.  NEUROLOGIC:  No focal deficits.  SKIN:  No rash.  BACK:  No kyphosis.  PSYCH:  Normal mood and affect.   EKG:  Normal sinus rhythm, no pathologic Q-waves, no ST-T wave changes,  normal QT interval.   Chest x-ray shows no significant abnormalities, linear atelectasis in  the left base.   Creatinine of 1.1.  Point-of-care markers: Troponin negative x2.  Hematocrit 38.1.   ASSESSMENT/PLAN:  A 60 year old with negative stress test recently who  is having some atypical  chest pain which has since resolved.   PLAN:  1. Given that everything has come back to normal including her blood      work and EKG, will discharge the patient from the emergency room.  2. Will have her follow up in our office for an echocardiogram.  3. This certainly could be costochondritis given that her sternum is      sore at the area where the risks insert.  4. If her pain continues, will consider performing outpatient      catheterization.  This was discussed with the patient. She is in      hoping that we can find a solution to this problem to avoid that.  5. She is comfortable with going home since her symptoms have      resolved.  6. She will also discuss with Dr. Ewing Schlein regarding the Zegerid, as to      whether or note she should continue this medicine.  7. Continue Synthroid for hypothyroidism.  8. Continue Lipitor for high cholesterol along with a baby aspirin      daily.  9. I will see the patient back in the office later this week.      Corky Crafts, MD  Electronically  Signed     JSV/MEDQ  D:  09/02/2006  T:  09/02/2006  Job:  [id number]

## 2010-10-05 NOTE — Op Note (Signed)
   NAME:  Dominique Mooney, HARTNER                          ACCOUNT NO.:  [id number]   MEDICAL RECORD NO.:  [id number]                   PATIENT TYPE:  AMB   LOCATION:  ENDO                                 FACILITY:  [facility]   PHYSICIAN:  Petra Kuba, M.D.                 DATE OF BIRTH:  10-Sep-1950   DATE OF PROCEDURE:  06/25/2002  DATE OF DISCHARGE:                                 OPERATIVE REPORT   PROCEDURE:  EGD.   INDICATION:  The patient with upper tract symptoms, abdominal pain.  Consent  was signed prior to any premedications given after risks, benefits, methods,  options thoroughly discussed in the office.   ADDITIONAL MEDICINES FOR THIS PROCEDURE:  Demerol 25, Versed 2.   DESCRIPTION OF PROCEDURE:  The video endoscope was inserted by direct  vision.  The esophagus was normal.  In the distal esophagus was a small  hiatal hernia.  The scope passed into the stomach and advanced through a  normal antrum, normal pylorus, into a normal duodenal bulb and around the C-  loop to a normal second portion of the duodenum.  The scope was withdrawn  back to the bulb, and a good look there ruled out ulcers in that location.  The scope was withdrawn back to the stomach, and retroflexed.  Angularis,  cardia, fundus, lesser and greater curve were normal on retroflexed  visualization except for the hiatal hernia being confirmed in the cardia.  Straight visualization of the stomach did not reveal any additional  findings.  Air was suctioned and the scope slowly withdrawn.  Again, a good  look at the esophagus was normal.  The scope was removed.  The patient  tolerated the procedure well.  There was no obvious immediate complication.   ENDOSCOPIC DIAGNOSES:  1. Tiny hiatal hernia.  2. Otherwise normal EGD.   PLAN:  1. Continue Aciphex since it seems to be helping.  2.     Happy to see back p.r.n.  3. Otherwise follow up in there months to recheck symptoms and make sure no     further work-up  plans are needed.  As an aside, need to double check her     ultrasound just to make sure that was okay, but I believe it was.                                               Petra Kuba, M.D.    MEM/MEDQ  D:  06/25/2002  T:  06/25/2002  Job:  [id number]   cc:   Armstead Peaks, M.D.  [address] [place], [place] [address]  Fax: [phone]   Dorisann Frames, M.D.  [place] [address] [place] [address]  Fax: [phone]

## 2010-10-18 ENCOUNTER — Ambulatory Visit (INDEPENDENT_AMBULATORY_CARE_PROVIDER_SITE_OTHER): Payer: 59 | Admitting: Adult Health

## 2010-10-18 ENCOUNTER — Telehealth: Payer: Self-pay | Admitting: Pulmonary Disease

## 2010-10-18 ENCOUNTER — Encounter: Payer: Self-pay | Admitting: Adult Health

## 2010-10-18 VITALS — BP 106/74 | HR 72 | Temp 97.3°F | Ht 61.0 in | Wt 212.0 lb

## 2010-10-18 DIAGNOSIS — J45909 Unspecified asthma, uncomplicated: Secondary | ICD-10-CM

## 2010-10-18 MED ORDER — LEVOFLOXACIN 500 MG PO TABS: 500.0000 mg | ORAL_TABLET | Freq: Every day | ORAL | Status: AC

## 2010-10-18 MED ORDER — PREDNISONE 10 MG PO TABS: ORAL_TABLET | ORAL | Status: AC

## 2010-10-18 NOTE — Telephone Encounter (Signed)
Pt c/o still having chest tightness, SOB, productive cough with brown phlegm. She was seen 10-02-10 and given a Zpak and felt some better for a few days but symptoms have returned. Pt set to see TP today at 3pm. Carron Curie, CMA

## 2010-10-18 NOTE — Progress Notes (Signed)
  Subjective:    Patient ID: Dominique Mooney, female    DOB: 17-Jan-1951, 60 y.o.   MRN: [id number]  HPI  62 with known hx of chronic cough and rhinitis   10/02/10 Acute OV  Pt presents for work in visit. Complains of prod cough with light yellow mucus, wheezing, increased SOB, hoarsenss, sinus pressure/congestion, PND x2days. OTC not helpings. Has sinus pain and pressure. Cough is getting worse. Has some wheezing this am.  Last cxr 08/02/10 w/ no acute process. She takes claritin daily but does not feel it is working last few week with lots of sinus drainage in throat. >>tx w/ zpack and hydromet   10/18/10 Acute OV  Pt returns for persistent cough and congestion. She finished zpak 5.21.12 . Only minimal improvement. She still having prod cough with light brown mucus, tightness in chest, wheezing, SOB. Cough is worse for last 2 days. Takes hydromet at night with some help.  Mucus is turning brown/yellow now. Feels run down for last week. Has some drainage in throat in am.  No vomitting or diarrhea.     Review of Systems  Constitutional:   No  weight loss, night sweats,  Fevers, chills, fatigue, or  lassitude.  HEENT:   No headaches,  Difficulty swallowing,  Tooth/dental problems, or  Sore throat,                + sneezing, itching,   nasal congestion, post nasal drip,   CV:  No chest pain,  Orthopnea, PND, swelling in lower extremities, anasarca, dizziness, palpitations, syncope.   GI  No heartburn, indigestion, abdominal pain, nausea, vomiting, diarrhea, change in bowel habits, loss of appetite, bloody stools.   Resp: No coughing up of blood.    No chest wall deformity  Skin: no rash or lesions.  GU: no dysuria, change in color of urine, no urgency or frequency.  No flank pain, no hematuria   MS:  No joint pain or swelling.  No decreased range of motion.  No back pain.  Psych:  No change in mood or affect. No depression or anxiety.  No memory loss.         Objective:   Physical  Exam  GEN: A/Ox3; pleasant , NAD,   HEENT:  [place]/AT,  EACs-clear, TMs-wnl, NOSE-clear drainage,   THROAT-clear, no lesions, no postnasal drip or exudate noted.   NECK:  Supple w/ fair ROM; no JVD; normal carotid impulses w/o bruits; no thyromegaly or nodules palpated; no lymphadenopathy.  RESP  Coarse BS w/ no wheeizng .no accessory muscle use, no dullness to percussion  CARD:  RRR, no m/r/g  , no peripheral edema, pulses intact, no cyanosis or clubbing.  GI:   Soft & nt; nml bowel sounds; no organomegaly or masses detected.  Musco: Warm bil, no deformities or joint swelling noted.   Neuro: alert, no focal deficits noted.    Skin: Warm, no lesions or rashes         Assessment & Plan:

## 2010-10-18 NOTE — Patient Instructions (Signed)
Levaquin 500mg  daily for 7 days Prednisone taper over next week.  Mucinex DM Twice daily  As needed  Cough/congestion  Saline nasal rinses As needed    follow up Dr. Craige Cotta  In 2  weeks  Please contact office for sooner follow up if symptoms do not improve or worsen or seek emergency care  Hydromet 1-2 tsp every 4-6 hr As needed  Cough may make you sleepy.

## 2010-10-19 NOTE — Assessment & Plan Note (Signed)
Asthmatic bronchitis -slow to resolve exacerbation   Plan:  Levaquin 500mg  daily for 7 days Prednisone taper over next week.  Mucinex DM Twice daily  As needed  Cough/congestion  Saline nasal rinses As needed    follow up Dr. Craige Cotta  In 2  weeks  Please contact office for sooner follow up if symptoms do not improve or worsen or seek emergency care  Hydromet 1-2 tsp every 4-6 hr As needed  Cough may make you sleepy.

## 2010-10-26 ENCOUNTER — Encounter: Payer: Self-pay | Admitting: Pulmonary Disease

## 2010-10-30 ENCOUNTER — Ambulatory Visit (INDEPENDENT_AMBULATORY_CARE_PROVIDER_SITE_OTHER): Payer: 59 | Admitting: Pulmonary Disease

## 2010-10-30 ENCOUNTER — Encounter: Payer: Self-pay | Admitting: Pulmonary Disease

## 2010-10-30 VITALS — BP 126/72 | HR 78 | Temp 98.4°F | Ht 61.0 in | Wt 211.6 lb

## 2010-10-30 DIAGNOSIS — R0789 Other chest pain: Secondary | ICD-10-CM

## 2010-10-30 DIAGNOSIS — J45909 Unspecified asthma, uncomplicated: Secondary | ICD-10-CM

## 2010-10-30 DIAGNOSIS — J309 Allergic rhinitis, unspecified: Secondary | ICD-10-CM

## 2010-10-30 MED ORDER — ALBUTEROL SULFATE HFA 108 (90 BASE) MCG/ACT IN AERS: 2.0000 | INHALATION_SPRAY | Freq: Four times a day (QID) | RESPIRATORY_TRACT | Status: DC | PRN

## 2010-10-30 MED ORDER — BUDESONIDE-FORMOTEROL FUMARATE 160-4.5 MCG/ACT IN AERO: 2.0000 | INHALATION_SPRAY | Freq: Two times a day (BID) | RESPIRATORY_TRACT | Status: DC

## 2010-10-30 MED ORDER — MONTELUKAST SODIUM 10 MG PO TABS: 10.0000 mg | ORAL_TABLET | Freq: Every day | ORAL | Status: DC

## 2010-10-30 NOTE — Assessment & Plan Note (Signed)
Likely related to coughing and chest irritation.  Advised her to discuss with primary if this persists.

## 2010-10-30 NOTE — Assessment & Plan Note (Signed)
She is slowly recovering from recent exacerbation.  Advised that she does not need additional antibiotics or prednisone at this time.

## 2010-10-30 NOTE — Assessment & Plan Note (Signed)
Stable

## 2010-10-30 NOTE — Patient Instructions (Signed)
Follow up in 6 months 

## 2010-10-30 NOTE — Progress Notes (Signed)
Subjective:    Patient ID: Dominique Mooney, female    DOB: 1950-12-08, 60 y.o.   MRN: [id number]  HPI 60 yo female with asthma and allergic rhinitis.  She was treated with antibiotics and prednisone recently for exacerbation.  She has improved.  She is not having cough or wheeze.  She still has some chest discomfort along her ribs.  Her sinuses are okay.  She denies sore throat or heartburn.  She has been using albuterol twice per day and this helps.    Past Medical History  Diagnosis Date  . Hypothyroidism   . Irritable bowel syndrome   . GERD (gastroesophageal reflux disease)   . Pancreatitis     after ERCP  . Hyperlipidemia   . Allergic rhinitis     RAST 08/02/08 negative, IgE 63.9  . Asthma     PFT 01/05/08 FEV1 2.36 (106%), TLC 4.16 (92%), DLCO 89%, no BD response     Family History  Problem Relation Age of Onset  . Allergies Brother   . Allergies Sister   . Heart attack Brother   . Heart attack Brother   . Stroke Mother   . Stroke Father   . Allergies Son      History   Social History  . Marital Status: Married    Spouse Name: N/A    Number of Children: 2  . 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 Smoking status: Never Smoker   . Smokeless tobacco: Not on file  . Alcohol Use: Not on file  . Drug Use: Not on file  . Sexually Active: Not on file   Other Topics Concern  . Not on file   Social History Narrative  . No narrative on file     Allergies  Allergen Reactions  . Penicillins     REACTION: rash     Outpatient Prescriptions Prior to Visit  Medication Sig Dispense Refill  . amLODipine (NORVASC) 5 MG tablet Take 5 mg by mouth daily.        Marland Kitchen aspirin 81 MG tablet Take 81 mg by mouth daily.        . fish oil-omega-3 fatty acids 1000 MG capsule Take 1 g by mouth daily.        Marland Kitchen levothyroxine (SYNTHROID, LEVOTHROID) 100 MCG tablet Take 100 mcg by mouth daily.        Marland Kitchen loratadine  (CLARITIN) 10 MG tablet Take 10 mg by mouth daily.        . mometasone (NASONEX) 50 MCG/ACT nasal spray 2 sprays by Nasal route daily.        . Multiple Vitamin (MULTIVITAMIN) capsule Take 1 capsule by mouth daily.        Marland Kitchen omeprazole (PRILOSEC OTC) 20 MG tablet Take 20 mg by mouth 2 (two) times daily.        . rosuvastatin (CRESTOR) 20 MG tablet Take 20 mg by mouth daily.        Marland Kitchen albuterol (PROVENTIL HFA) 108 (90 BASE) MCG/ACT inhaler Inhale 2 puffs into the lungs every 6 (six) hours as needed.        . budesonide-formoterol (SYMBICORT) 160-4.5 MCG/ACT inhaler Inhale 2 puffs into the lungs 2 (two) times daily.        . montelukast (SINGULAIR) 10 MG tablet Take 10 mg by mouth at bedtime.        Marland Kitchen HYDROcodone-acetaminophen (VICODIN) 5-500 MG  per tablet Take 1 tablet by mouth every 6 (six) hours as needed.        . metaxalone (SKELAXIN) 800 MG tablet Take 800 mg by mouth 3 (three) times daily as needed.            Review of Systems     Objective:   Physical Exam  BP 126/72  Pulse 78  Temp(Src) 98.4 F (36.9 C) (Oral)  Ht 5\' 1"  (1.549 m)  Wt 211 lb 9.6 oz (95.981 kg)  BMI 39.98 kg/m2  SpO2 98%  General: normal appearance and obese.  Nose: clear nasal discharge, no sinus tenderness  Mouth: no oral lesions  Neck: no JVD.  Lungs: decreased breath sounds, no wheezing  Heart: regular rhythm, normal rate, and no murmurs.  Extremities: no clubbing, cyanosis, edema, or deformity noted  Cervical Nodes: no significant adenopathy      Assessment & Plan:   ASTHMA She is slowly recovering from recent exacerbation.  Advised that she does not need additional antibiotics or prednisone at this time.  ALLERGIC RHINITIS Stable.  Atypical chest pain Likely related to coughing and chest irritation.  Advised her to discuss with primary if this persists.    Updated Medication List Outpatient Encounter Prescriptions as of 10/30/2010  Medication Sig Dispense Refill  . albuterol (PROVENTIL  HFA) 108 (90 BASE) MCG/ACT inhaler Inhale 2 puffs into the lungs every 6 (six) hours as needed.  1 Inhaler  11  . amLODipine (NORVASC) 5 MG tablet Take 5 mg by mouth daily.        Marland Kitchen aspirin 81 MG tablet Take 81 mg by mouth daily.        . budesonide-formoterol (SYMBICORT) 160-4.5 MCG/ACT inhaler Inhale 2 puffs into the lungs 2 (two) times daily.  1 Inhaler  11  . fish oil-omega-3 fatty acids 1000 MG capsule Take 1 g by mouth daily.        Marland Kitchen levothyroxine (SYNTHROID, LEVOTHROID) 100 MCG tablet Take 100 mcg by mouth daily.        Marland Kitchen loratadine (CLARITIN) 10 MG tablet Take 10 mg by mouth daily.        . mometasone (NASONEX) 50 MCG/ACT nasal spray 2 sprays by Nasal route daily.        . montelukast (SINGULAIR) 10 MG tablet Take 1 tablet (10 mg total) by mouth at bedtime.  30 tablet  11  . Multiple Vitamin (MULTIVITAMIN) capsule Take 1 capsule by mouth daily.        Marland Kitchen omeprazole (PRILOSEC OTC) 20 MG tablet Take 20 mg by mouth 2 (two) times daily.        . rosuvastatin (CRESTOR) 20 MG tablet Take 20 mg by mouth daily.        Marland Kitchen DISCONTD: albuterol (PROVENTIL HFA) 108 (90 BASE) MCG/ACT inhaler Inhale 2 puffs into the lungs every 6 (six) hours as needed.        Marland Kitchen DISCONTD: budesonide-formoterol (SYMBICORT) 160-4.5 MCG/ACT inhaler Inhale 2 puffs into the lungs 2 (two) times daily.        Marland Kitchen DISCONTD: montelukast (SINGULAIR) 10 MG tablet Take 10 mg by mouth at bedtime.        Marland Kitchen DISCONTD: HYDROcodone-acetaminophen (VICODIN) 5-500 MG per tablet Take 1 tablet by mouth every 6 (six) hours as needed.        Marland Kitchen DISCONTD: metaxalone (SKELAXIN) 800 MG tablet Take 800 mg by mouth 3 (three) times daily as needed.

## 2010-12-20 ENCOUNTER — Other Ambulatory Visit: Payer: Self-pay | Admitting: Obstetrics and Gynecology

## 2011-01-14 ENCOUNTER — Other Ambulatory Visit: Payer: Self-pay | Admitting: Obstetrics and Gynecology

## 2011-01-14 DIAGNOSIS — N6019 Diffuse cystic mastopathy of unspecified breast: Secondary | ICD-10-CM

## 2011-01-14 DIAGNOSIS — N644 Mastodynia: Secondary | ICD-10-CM

## 2011-01-15 ENCOUNTER — Other Ambulatory Visit: Payer: Self-pay | Admitting: Obstetrics and Gynecology

## 2011-01-15 ENCOUNTER — Ambulatory Visit
Admission: RE | Admit: 2011-01-15 | Discharge: 2011-01-15 | Disposition: A | Discharge status: Home / Self Care | Payer: 59 | Source: Ambulatory Visit | Attending: Obstetrics and Gynecology | Admitting: Obstetrics and Gynecology

## 2011-01-15 DIAGNOSIS — N6019 Diffuse cystic mastopathy of unspecified breast: Secondary | ICD-10-CM

## 2011-01-15 DIAGNOSIS — N644 Mastodynia: Secondary | ICD-10-CM

## 2011-01-28 ENCOUNTER — Ambulatory Visit (INDEPENDENT_AMBULATORY_CARE_PROVIDER_SITE_OTHER): Payer: 59 | Admitting: Gastroenterology

## 2011-01-28 ENCOUNTER — Encounter: Payer: Self-pay | Admitting: Gastroenterology

## 2011-01-28 VITALS — BP 110/72 | HR 80 | Ht 61.0 in | Wt 212.2 lb

## 2011-01-28 DIAGNOSIS — K219 Gastro-esophageal reflux disease without esophagitis: Secondary | ICD-10-CM

## 2011-01-28 NOTE — Patient Instructions (Signed)
You should change the way you are taking your proton pump inhibitor (prilosec or omeprazole) so that you are taking it 20-30 minutes prior to a decent meal as that is the way the pill is designed to work most effectively. Take the PEPCID or ZANTAC, one pill at bedtime. Call Dr. Christella Hartigan in 4 weeks to report on your symptoms. If still bothered, we will schedule EGD. A copy of this information will be made available to Dr. Ricki Miller.

## 2011-01-28 NOTE — Progress Notes (Signed)
Review of gastrointestinal problems:  1. Dysphasia, nausea 2009. EGD may 2009 was normal. Symptoms thought to be GERD related.  Symptoms improved with addition of H2 blocker (March 2011). Barium esophagram 2011 was normal. 2. routine risk for colon cancer, colonoscopy may 2009: hemorrhoids only. Next colonoscopy may 2019; Intermittent IBS-like discomforts that are helped with antispasmodics usually.  3. post ERCP pancreatitis, 2000  4. laparoscopic cholecystectomy, 2000   HPI: This is a  very pleasant 60 year old woman whom I last saw about a year and a half ago  She has a constant substernal pain.  She takes prilosec twice a day (before breakfast and before bedtime).  She takes a pepcid just before supper.  She has a pain, sore when swallowing too much, in certain location in chest.  She also feels a sensation constantly in chest (soreness as well).  No dypshagia. No overt gi bleeding.  She has gained weight lately.      Review of systems: Pertinent positive and negative review of systems were noted in the above HPI section.  All other review of systems was otherwise negative.   Past Medical History  Diagnosis Date  . Hypothyroidism   . Irritable bowel syndrome   . GERD (gastroesophageal reflux disease)   . Pancreatitis     after ERCP  . Hyperlipidemia   . Allergic rhinitis     RAST 08/02/08 negative, IgE 63.9  . Asthma     PFT 01/05/08 FEV1 2.36 (106%), TLC 4.16 (92%), DLCO 89%, no BD response    Past Surgical History  Procedure Date  . Cholecystectomy   . Hernia repair   . Vesicovaginal fistula closure w/ tah   . Salpingoophorectomy      reports that she has never smoked. She has never used smokeless tobacco. She reports that she does not drink alcohol or use illicit drugs.  family history includes Allergies in her brother, sister, and son; Heart attack in her brothers; and Stroke in her father and mother.    Current Medications, Allergies were all reviewed with  the patient via [facility] electronic medical record system.    Physical Exam: BP 110/72  Pulse 80  Ht 5\' 1"  (1.549 m)  Wt 212 lb 3.2 oz (96.253 kg)  BMI 40.09 kg/m2 Constitutional: generally well-appearing Psychiatric: alert and oriented x3 Eyes: extraocular movements intact Mouth: oral pharynx moist, no lesions Neck: supple no lymphadenopathy Cardiovascular: heart regular rate and rhythm Lungs: clear to auscultation bilaterally Abdomen: soft, nontender, nondistended, no obvious ascites, no peritoneal signs, normal bowel sounds Extremities: no lower extremity edema bilaterally Skin: no lesions on visible extremities    Assessment and plan: 60 y.o. female with substernal chest discomfort, chronic, pyrosis  She is not taking her antiacid medicines at the right time correlation to food or bedtime. Previously, a year and half ago this was also the case and adjusting her medicines did seem to help and so I have re-educated her on the correct timing of proton pump inhibitors and H2 blockers.  She will call back in about 4 weeks to report on her symptoms and if no improvement she might require repeat EGD.

## 2011-02-01 ENCOUNTER — Other Ambulatory Visit: Payer: Self-pay | Admitting: Gastroenterology

## 2011-02-05 ENCOUNTER — Other Ambulatory Visit: Payer: Self-pay

## 2011-02-05 MED ORDER — OMEPRAZOLE MAGNESIUM 20 MG PO TBEC: 20.0000 mg | DELAYED_RELEASE_TABLET | Freq: Two times a day (BID) | ORAL | Status: DC

## 2011-04-05 ENCOUNTER — Telehealth: Payer: Self-pay | Admitting: Internal Medicine

## 2011-04-05 NOTE — Telephone Encounter (Signed)
Yes, EGD at [facility]. thanks

## 2011-04-05 NOTE — Telephone Encounter (Signed)
Pt calling to report that her symptoms are not any better and actually getting worse.  Do you want to go ahead and schedule EGD per your last office note?

## 2011-04-05 NOTE — Telephone Encounter (Signed)
Pt scheduled for Previsit and EGD. 

## 2011-04-22 ENCOUNTER — Ambulatory Visit (AMBULATORY_SURGERY_CENTER): Payer: 59 | Admitting: *Deleted

## 2011-04-22 VITALS — Ht 61.0 in | Wt 211.8 lb

## 2011-04-22 DIAGNOSIS — K219 Gastro-esophageal reflux disease without esophagitis: Secondary | ICD-10-CM

## 2011-04-26 ENCOUNTER — Ambulatory Visit (AMBULATORY_SURGERY_CENTER): Payer: 59 | Admitting: Gastroenterology

## 2011-04-26 ENCOUNTER — Encounter: Payer: Self-pay | Admitting: Gastroenterology

## 2011-04-26 VITALS — BP 139/85 | HR 86 | Temp 97.7°F | Resp 18 | Ht 61.0 in | Wt 211.0 lb

## 2011-04-26 DIAGNOSIS — K2990 Gastroduodenitis, unspecified, without bleeding: Secondary | ICD-10-CM

## 2011-04-26 DIAGNOSIS — K219 Gastro-esophageal reflux disease without esophagitis: Secondary | ICD-10-CM

## 2011-04-26 MED ORDER — SODIUM CHLORIDE 0.9 % IV SOLN: 500.0000 mL | INTRAVENOUS | Status: DC

## 2011-04-26 NOTE — Patient Instructions (Signed)
Please follow all discharge instructions given to you by the recovery room nurse. If you have any questions or problems after discharge please call [phone]. You will receive a phone call in the am to see how you are doing and answer any questions you may have. Thank you for choosing [facility] for your health care needs.  

## 2011-04-26 NOTE — Progress Notes (Signed)
Pt tolerated the EGD very well. Maw

## 2011-04-26 NOTE — Op Note (Signed)
[facility] [address] [place], [place]  [address]  ENDOSCOPY PROCEDURE REPORT  PATIENT:  Dominique, Mooney  MR#:  [id number] BIRTHDATE:  1950/06/21, 60 yrs. old  GENDER:  female ENDOSCOPIST:  Rachael Fee, MD PROCEDURE DATE:  04/26/2011 PROCEDURE:  EGD, diagnostic 43235 ASA CLASS:  Class II INDICATIONS:  chronic GERD, not improved with BID PPI, bedtime H2 blocker; also chest pains MEDICATIONS:   Fentanyl 50 mcg IV, These medications were titrated to patient response per physician's verbal order, Versed 4 mg IV TOPICAL ANESTHETIC:  Cetacaine Spray  DESCRIPTION OF PROCEDURE:   After the risks benefits and alternatives of the procedure were thoroughly explained, informed consent was obtained.  The LB GIF-H180 [id number] endoscope was introduced through the mouth and advanced to the second portion of the duodenum, without limitations.  The instrument was slowly withdrawn as the mucosa was fully examined. <<PROCEDUREIMAGES>> Mild gastritis was found.  There was mild, non-specific gastritis (see image1).  Otherwise the examination was normal (see image2, image3, image4, image5, and image6).   Retroflexed views revealed no abnormalities.  The scope was then withdrawn from the patient and the procedure completed. COMPLICATIONS:  None  ENDOSCOPIC IMPRESSION: 1) Mild  non-specific gastritis 2) Otherwise normal examination  RECOMMENDATIONS: My office will arrange a 48hour wireless pH test (Bravo test) to be done. This will be done ON medicines so you will need to continue taking twice daily PPI (prilosec) and nightly H2 blocker.   ______________________________ Rachael Fee, MD  n. eSIGNED:   Rachael Fee at 04/26/2011 11:42 AM  Foye Clock, [phone]

## 2011-04-29 ENCOUNTER — Ambulatory Visit: Payer: 59 | Admitting: Pulmonary Disease

## 2011-04-29 ENCOUNTER — Telehealth: Payer: Self-pay

## 2011-04-29 ENCOUNTER — Telehealth: Payer: Self-pay | Admitting: *Deleted

## 2011-04-29 ENCOUNTER — Other Ambulatory Visit: Payer: Self-pay | Admitting: Gastroenterology

## 2011-04-29 DIAGNOSIS — K219 Gastro-esophageal reflux disease without esophagitis: Secondary | ICD-10-CM

## 2011-04-29 NOTE — Telephone Encounter (Signed)
Pt has been scheduled and is aware of the instructions she has the date approved through her boss

## 2011-04-29 NOTE — Telephone Encounter (Signed)

## 2011-04-29 NOTE — Telephone Encounter (Signed)
Pt will check with her boss and call back to if the appt is ok for 05/02/11

## 2011-05-02 ENCOUNTER — Ambulatory Visit (HOSPITAL_COMMUNITY)
Admission: RE | Admit: 2011-05-02 | Discharge: 2011-05-02 | Disposition: A | Discharge status: Home / Self Care | Payer: 59 | Source: Ambulatory Visit | Attending: Gastroenterology | Admitting: Gastroenterology

## 2011-05-02 ENCOUNTER — Encounter (HOSPITAL_COMMUNITY): Payer: Self-pay | Admitting: *Deleted

## 2011-05-02 ENCOUNTER — Encounter (HOSPITAL_COMMUNITY)
Admission: RE | Disposition: A | Discharge status: Home / Self Care | Payer: Self-pay | Source: Ambulatory Visit | Attending: Gastroenterology

## 2011-05-02 DIAGNOSIS — E785 Hyperlipidemia, unspecified: Secondary | ICD-10-CM | POA: Insufficient documentation

## 2011-05-02 DIAGNOSIS — E039 Hypothyroidism, unspecified: Secondary | ICD-10-CM | POA: Insufficient documentation

## 2011-05-02 DIAGNOSIS — J45909 Unspecified asthma, uncomplicated: Secondary | ICD-10-CM | POA: Insufficient documentation

## 2011-05-02 DIAGNOSIS — Z9079 Acquired absence of other genital organ(s): Secondary | ICD-10-CM | POA: Insufficient documentation

## 2011-05-02 DIAGNOSIS — Z79899 Other long term (current) drug therapy: Secondary | ICD-10-CM | POA: Insufficient documentation

## 2011-05-02 DIAGNOSIS — K589 Irritable bowel syndrome without diarrhea: Secondary | ICD-10-CM | POA: Insufficient documentation

## 2011-05-02 DIAGNOSIS — K219 Gastro-esophageal reflux disease without esophagitis: Secondary | ICD-10-CM | POA: Insufficient documentation

## 2011-05-02 HISTORY — PX: BRAVO PH STUDY: SHX5421

## 2011-05-02 HISTORY — PX: ESOPHAGOGASTRODUODENOSCOPY: SHX5428

## 2011-05-02 SURGERY — PH MONITORING, ESOPHAGUS, WIRELESS
Anesthesia: Moderate Sedation

## 2011-05-02 MED ORDER — SODIUM CHLORIDE 0.9 % IV SOLN
INTRAVENOUS | Status: DC
  Administered 2011-05-02: 500 mL via INTRAVENOUS

## 2011-05-02 MED ORDER — FENTANYL CITRATE 0.05 MG/ML IJ SOLN
INTRAMUSCULAR | Status: DC | PRN
  Administered 2011-05-02 (×3): 25 ug via INTRAVENOUS

## 2011-05-02 MED ORDER — BUTAMBEN-TETRACAINE-BENZOCAINE 2-2-14 % EX AERO
INHALATION_SPRAY | CUTANEOUS | Status: DC | PRN
  Administered 2011-05-02: 2 via TOPICAL

## 2011-05-02 MED ORDER — MIDAZOLAM HCL 10 MG/2ML IJ SOLN
INTRAMUSCULAR | Status: AC
  Filled 2011-05-02: qty 2

## 2011-05-02 MED ORDER — MIDAZOLAM HCL 10 MG/2ML IJ SOLN
INTRAMUSCULAR | Status: DC | PRN
  Administered 2011-05-02 (×4): 2 mg via INTRAVENOUS

## 2011-05-02 MED ORDER — FENTANYL CITRATE 0.05 MG/ML IJ SOLN
INTRAMUSCULAR | Status: AC
  Filled 2011-05-02: qty 2

## 2011-05-02 NOTE — H&P (Signed)
  HPI: This is a  Pleasant woman with chronic UGI symptoms, not improving on maximal medical acid suppression (PPI BID, H2 blocker nightly).    Past Medical History  Diagnosis Date  . Hypothyroidism   . Irritable bowel syndrome   . GERD (gastroesophageal reflux disease)   . Pancreatitis     after ERCP  . Hyperlipidemia   . Allergic rhinitis     RAST 08/02/08 negative, IgE 63.9  . Asthma     PFT 01/05/08 FEV1 2.36 (106%), TLC 4.16 (92%), DLCO 89%, no BD response    Past Surgical History  Procedure Date  . Cholecystectomy   . Hernia repair   . Vesicovaginal fistula closure w/ tah   . Salpingoophorectomy     Current Facility-Administered Medications  Medication Dose Route Frequency Provider Last Rate Last Dose  . 0.9 %  sodium chloride infusion   Intravenous Continuous Rob Bunting, MD 20 mL/hr at 05/02/11 0818 500 mL at 05/02/11 0818    Allergies as of 04/29/2011 - Review Complete 04/26/2011  Allergen Reaction Noted  . Penicillins      Family History  Problem Relation Age of Onset  . Allergies Brother   . Allergies Sister   . Heart attack Brother   . Heart attack Brother   . Stroke Mother   . Stroke Father   . Allergies Son   . Colon cancer Neg Hx   . Stomach cancer Neg Hx   . Esophageal cancer Neg Hx     History   Social History  . Marital Status: Married    Spouse Name: N/A    Number of Children: 2  . 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 Smoking status: Never Smoker   . Smokeless tobacco: Never Used  . Alcohol Use: No  . Drug Use: No  . Sexually Active: Not on file   Other Topics Concern  . Not on file   Social History Narrative  . No narrative on file      Physical Exam: BP 117/75  Pulse 82  Temp(Src) 98.2 F (36.8 C) (Oral)  Resp 20  SpO2 97% Constitutional: generally well-appearing Psychiatric: alert and oriented x3 Abdomen: soft, nontender, nondistended, no  obvious ascites, no peritoneal signs, normal bowel sounds     Assessment and plan: 60 y.o. female with ugi symptoms, unresponsive to maximum meds  PH test today, with bravo system placed by EGD.

## 2011-05-02 NOTE — Op Note (Signed)
[facility] [address] [place], [place]  [address]  ENDOSCOPY PROCEDURE REPORT  PATIENT:  Dominique Mooney, Dominique Mooney  MR#:  [id number] BIRTHDATE:  October 23, 1950, 60 yrs. old  GENDER:  female ENDOSCOPIST:  Rachael Fee, MD PROCEDURE DATE:  05/02/2011 PROCEDURE:  Bravo pH probe placement ASA CLASS:  Class II INDICATIONS:  GERD like symptoms, unresponsive to maximum medical acid suppression (BID PPI and nightly H2 blocker) MEDICATIONS:  Fentanyl 75 mcg IV, Versed 8 mg IV TOPICAL ANESTHETIC:  Cetacaine Spray  DESCRIPTION OF PROCEDURE:   After the risks benefits and alternatives of the procedure were thoroughly explained, informed consent was obtained.  The  endoscope was introduced through the mouth and advanced to the stomach body, without limitations.  The instrument was slowly withdrawn as the mucosa was fully examined. <<PROCEDUREIMAGES>>  GE junction was measured at 38cm from incisors and then Bravo pH probe was placed in usual fashion at 6cm above the junction (32cm from incisors). The probe position was confirmed endoscopically following placement (see image1 and image2).    Retroflexed views revealed no abnormalities.    The scope was then withdrawn from the patient and the procedure completed.  COMPLICATIONS:  None  ENDOSCOPIC IMPRESSION: 1) Normale examination. 2) Bravo 48 hour pH probe placed 6cm above GE junction.  RECOMMENDATIONS: Continue to take twice daily PPI (omeprazole) and bedtime H2 blocker (pepcid/zantac) throughout the study. Dr. Christella Hartigan will contact you after the pH report is completed.  ______________________________ Rachael Fee, MD  n. eSIGNED:   Rachael Fee at 05/02/2011 09:06 AM  Foye Clock, [phone]

## 2011-05-03 ENCOUNTER — Encounter (HOSPITAL_COMMUNITY): Payer: Self-pay | Admitting: Gastroenterology

## 2011-05-03 ENCOUNTER — Encounter (HOSPITAL_COMMUNITY): Payer: Self-pay

## 2011-05-15 ENCOUNTER — Telehealth: Payer: Self-pay | Admitting: Gastroenterology

## 2011-05-15 ENCOUNTER — Encounter: Payer: Self-pay | Admitting: Gastroenterology

## 2011-05-15 NOTE — Telephone Encounter (Signed)
Wireless pH test 48 hours, done 05/02/11 while still on BID PPI (omeprazole) and bedtime H2 blocker was + for continued pathologic acid exposure.    Please call her.  Needs rov with me in 6-7 weeks.  In meantime, I want to change her PPI to nexium twice daily (lets give her 2-3 weeks of samples that she can pick up).  Also give her 2-3 weeks of protonix samples BID.  Would like her to try the nexium first for two weeks, then then protonix for two weeks. She should still be taking H2 blocker at bedtime.  I'm interested to hear if either of these meds control her symptoms better.    She may need surgical referral for GERD if various PPI regimines fail to control her symptoms.

## 2011-05-16 MED ORDER — ESOMEPRAZOLE MAGNESIUM 20 MG PO CPDR: 40.0000 mg | DELAYED_RELEASE_CAPSULE | Freq: Two times a day (BID) | ORAL | Status: DC

## 2011-05-16 MED ORDER — PANTOPRAZOLE SODIUM 40 MG PO TBEC: 40.0000 mg | DELAYED_RELEASE_TABLET | Freq: Two times a day (BID) | ORAL | Status: DC

## 2011-05-16 NOTE — Telephone Encounter (Signed)
Pt aware of the test results, ROV scheduled and samples of Nexium given to the pt.  She will pick those up today.  Protonix sent to her pharmacy.

## 2011-05-31 ENCOUNTER — Other Ambulatory Visit: Payer: Self-pay | Admitting: Cardiovascular Disease

## 2011-06-02 ENCOUNTER — Other Ambulatory Visit: Payer: Self-pay | Admitting: Pulmonary Disease

## 2011-06-10 ENCOUNTER — Ambulatory Visit: Payer: 59 | Admitting: Gastroenterology

## 2011-06-12 ENCOUNTER — Other Ambulatory Visit: Payer: Self-pay | Admitting: *Deleted

## 2011-06-12 ENCOUNTER — Ambulatory Visit (INDEPENDENT_AMBULATORY_CARE_PROVIDER_SITE_OTHER): Payer: 59 | Admitting: Adult Health

## 2011-06-12 ENCOUNTER — Encounter: Payer: Self-pay | Admitting: Adult Health

## 2011-06-12 VITALS — BP 120/80 | HR 83 | Temp 98.3°F | Ht 61.0 in | Wt 208.2 lb

## 2011-06-12 DIAGNOSIS — R0789 Other chest pain: Secondary | ICD-10-CM

## 2011-06-12 DIAGNOSIS — J45909 Unspecified asthma, uncomplicated: Secondary | ICD-10-CM

## 2011-06-12 MED ORDER — PREDNISONE 10 MG PO TABS: ORAL_TABLET | ORAL | Status: DC

## 2011-06-12 MED ORDER — AZITHROMYCIN 250 MG PO TABS: ORAL_TABLET | ORAL | Status: AC

## 2011-06-12 MED ORDER — AZITHROMYCIN 250 MG PO TABS: ORAL_TABLET | ORAL | Status: DC

## 2011-06-12 NOTE — Progress Notes (Signed)
  Subjective:    Patient ID: Dominique Mooney, female    DOB: 01/12/1951, 61 y.o.   MRN: [id number]  HPI  73 with known hx of chronic cough and rhinitis   10/02/10 Acute OV  Pt presents for work in visit. Complains of prod cough with light yellow mucus, wheezing, increased SOB, hoarsenss, sinus pressure/congestion, PND x2days. OTC not helpings. Has sinus pain and pressure. Cough is getting worse. Has some wheezing this am.  Last cxr 08/02/10 w/ no acute process. She takes claritin daily but does not feel it is working last few week with lots of sinus drainage in throat. >>tx w/ zpack and hydromet   10/18/10 Acute OV  Pt returns for persistent cough and congestion. She finished zpak 5.21.12 . Only minimal improvement. She still having prod cough with light brown mucus, tightness in chest, wheezing, SOB. Cough is worse for last 2 days. Takes hydromet at night with some help.  Mucus is turning brown/yellow now. Feels run down for last week. Has some drainage in throat in am.  No vomitting or diarrhea.   06/12/2011 Acute OV  Complains of post nasal drainage x 2 weeks , c/o chest tightness increase sob x 2 days.  Does have a lot of chest tightness. No exertional chest pain . EKG with no acute changes.  Dry cough mostly. Wheezing worse for last 2 days  No fever or vomiting  Lots of sinus drainage.     Review of Systems  Constitutional:   No  weight loss, night sweats,  Fevers, chills, fatigue, or  lassitude.  HEENT:   No headaches,  Difficulty swallowing,  Tooth/dental problems, or  Sore throat,                + sneezing, itching,   nasal congestion, post nasal drip,   CV:  No  Orthopnea, PND, swelling in lower extremities, anasarca, dizziness, palpitations, syncope.   GI  No heartburn, indigestion, abdominal pain, nausea, vomiting, diarrhea, change in bowel habits, loss of appetite, bloody stools.   Resp: No coughing up of blood.    No chest wall deformity  Skin: no rash or lesions.  GU: no  dysuria, change in color of urine, no urgency or frequency.  No flank pain, no hematuria   MS:  No joint pain or swelling.  No decreased range of motion.  No back pain.  Psych:  No change in mood or affect. No depression or anxiety.  No memory loss.         Objective:   Physical Exam  GEN: A/Ox3; pleasant , NAD,   HEENT:  Reeves/AT,  EACs-clear, TMs-wnl, NOSE-clear drainage,   THROAT-clear, no lesions, no postnasal drip or exudate noted.   NECK:  Supple w/ fair ROM; no JVD; normal carotid impulses w/o bruits; no thyromegaly or nodules palpated; no lymphadenopathy.  RESP  Coarse BS w/ no wheeizng .no accessory muscle use, no dullness to percussion  CARD:  RRR, no m/r/g  , no peripheral edema, pulses intact, no cyanosis or clubbing.  GI:   Soft & nt; nml bowel sounds; no organomegaly or masses detected.  Musco: Warm bil, no deformities or joint swelling noted.   Neuro: alert, no focal deficits noted.    Skin: Warm, no lesions or rashes     EKG : NSR w/ no acute process noted    Assessment & Plan:

## 2011-06-12 NOTE — Patient Instructions (Signed)
Zpack take as directed.  Mucinex Twice daily  As needed  Congestion  Prednisone taper over next week.  Saline nasal rinses As needed   Continue on claritin daily  follow up Dr. Craige Cotta  In 2 weeks as planned and As needed   Please contact office for sooner follow up if symptoms do not improve or worsen or seek emergency care

## 2011-06-13 NOTE — Assessment & Plan Note (Addendum)
Flare with URI  EKG check due to chest tightness, showed NSR w/ no acute process noted,-advised if not improving will need further evalution/ER .   Plan:  Zpack take as directed.  Mucinex Twice daily  As needed  Congestion  Prednisone taper over next week.  Saline nasal rinses As needed   Continue on claritin daily  follow up Dr. Craige Cotta  In 2 weeks as planned and As needed   Please contact office for sooner follow up if symptoms do not improve or worsen or seek emergency care

## 2011-06-13 NOTE — Progress Notes (Signed)
Reviewed, and agree with assessment/plan. 

## 2011-06-24 ENCOUNTER — Ambulatory Visit (INDEPENDENT_AMBULATORY_CARE_PROVIDER_SITE_OTHER)
Admission: RE | Admit: 2011-06-24 | Discharge: 2011-06-24 | Disposition: A | Discharge status: Home / Self Care | Payer: 59 | Source: Ambulatory Visit | Attending: Pulmonary Disease | Admitting: Pulmonary Disease

## 2011-06-24 ENCOUNTER — Ambulatory Visit (INDEPENDENT_AMBULATORY_CARE_PROVIDER_SITE_OTHER): Payer: 59 | Admitting: Pulmonary Disease

## 2011-06-24 ENCOUNTER — Encounter: Payer: Self-pay | Admitting: Pulmonary Disease

## 2011-06-24 VITALS — BP 110/80 | HR 79 | Temp 98.2°F | Ht 61.0 in | Wt 209.6 lb

## 2011-06-24 DIAGNOSIS — J309 Allergic rhinitis, unspecified: Secondary | ICD-10-CM

## 2011-06-24 DIAGNOSIS — J45909 Unspecified asthma, uncomplicated: Secondary | ICD-10-CM

## 2011-06-24 MED ORDER — ZAFIRLUKAST 20 MG PO TABS: 20.0000 mg | ORAL_TABLET | Freq: Two times a day (BID) | ORAL | Status: DC

## 2011-06-24 NOTE — Progress Notes (Signed)
Chief Complaint  Patient presents with  . Follow-up    Pt states she is feeling better still is having soreness in her chest and back. denies any cough, occas chest tightness, veyr little PND. Pt states her breathing has been doing fine    History of Present Illness: Dominique Mooney is a 61 y.o. female with asthma and allergic rhinitis.  She has some improvement since seeing Tammy.  She still has chest soreness/tightness.  Her cough and sinuses are better.  She is not having as much trouble with her sinuses.  She is followed by GI for reflux, and this is better with her medicine.  Past Medical History  Diagnosis Date  . Hypothyroidism   . Irritable bowel syndrome   . GERD (gastroesophageal reflux disease)   . Pancreatitis     after ERCP  . Hyperlipidemia   . Allergic rhinitis     RAST 08/02/08 negative, IgE 63.9  . Asthma     PFT 01/05/08 FEV1 2.36 (106%), TLC 4.16 (92%), DLCO 89%, no BD response    Past Surgical History  Procedure Date  . Cholecystectomy   . Hernia repair   . Vesicovaginal fistula closure w/ tah   . Salpingoophorectomy   . Bravo ph study 05/02/2011    Procedure: BRAVO PH STUDY;  Surgeon: Rob Bunting, MD;  Location: [facility];  Service: Endoscopy;  Laterality: N/A;  48 hour wirless pH test (Bravo Test)  . Esophagogastroduodenoscopy 05/02/2011    Procedure: ESOPHAGOGASTRODUODENOSCOPY (EGD);  Surgeon: Rob Bunting, MD;  Location: [facility];  Service: Endoscopy;  Laterality: N/A;    Allergies  Allergen Reactions  . Penicillins     REACTION: rash    Physical Exam:  Blood pressure 110/80, pulse 79, temperature 98.2 F (36.8 C), temperature source Oral, height 5\' 1"  (1.549 m), weight 209 lb 9.6 oz (95.074 kg), SpO2 95.00%. Body mass index is 39.60 kg/(m^2). Wt Readings from Last 2 Encounters:  06/24/11 209 lb 9.6 oz (95.074 kg)  06/12/11 208 lb 3.2 oz (94.439 kg)    General - No distress HEENT - no sinus tenderness, clear nasal discharge, no  oral exudate Cardiac - s1s2 regular Chest - no wheeze/rales/tenderness Abdomen - soft, nontender Extremities - no edema Skin - no rashes Neurologic - normal strength Psychiatric - normal mood, behavior   Assessment/Plan:  Outpatient Encounter Prescriptions as of 06/24/2011  Medication Sig Dispense Refill  . albuterol (PROVENTIL HFA) 108 (90 BASE) MCG/ACT inhaler Inhale 2 puffs into the lungs every 6 (six) hours as needed.  1 Inhaler  11  . amLODipine (NORVASC) 5 MG tablet TAKE 1 TABLET BY MOUTH ONCE DAILY  90 tablet  3  . aspirin 81 MG tablet Take 81 mg by mouth daily.        . budesonide-formoterol (SYMBICORT) 160-4.5 MCG/ACT inhaler Inhale 2 puffs into the lungs 2 (two) times daily.  1 Inhaler  11  . esomeprazole (NEXIUM) 20 MG capsule Take 2 capsules (40 mg total) by mouth 2 (two) times daily.  30 capsule  1  . famotidine (PEPCID) 10 MG tablet Take 10 mg by mouth Nightly.        . fish oil-omega-3 fatty acids 1000 MG capsule Take 1 g by mouth daily.        Marland Kitchen levothyroxine (SYNTHROID, LEVOTHROID) 100 MCG tablet Take 100 mcg by mouth daily.        Marland Kitchen loratadine (CLARITIN) 10 MG tablet Take 10 mg by mouth daily.        Marland Kitchen  Multiple Vitamin (MULTIVITAMIN) capsule Take 1 capsule by mouth daily.        Marland Kitchen NASONEX 50 MCG/ACT nasal spray TAKE 2 SPRAYS ONCE A DAY  17 g  1  . omeprazole (PRILOSEC OTC) 20 MG tablet Take 1 tablet (20 mg total) by mouth 2 (two) times daily.  180 tablet  3  . DISCONTD: pantoprazole (PROTONIX) 40 MG tablet Take 1 tablet (40 mg total) by mouth 2 (two) times daily.  30 tablet  1  . DISCONTD: predniSONE (DELTASONE) 10 MG tablet 4 tabs for 2 days, then 3 tabs for 2 days, 2 tabs for 2 days, then 1 tab for 2 days, then stop  20 tablet  0    Dominique Mooney Pager:  [phone] 06/24/2011, 2:54 PM

## 2011-06-24 NOTE — Assessment & Plan Note (Signed)
She has persistent symptoms.  Will check chest xray and repeat PFT.  Will start accolate.  She is to continue symbicort and as needed albuterol.

## 2011-06-24 NOTE — Assessment & Plan Note (Signed)
She is to continue as needed nasal irrigation and claritin.  Will see if accolate helps with this also.

## 2011-06-24 NOTE — Patient Instructions (Signed)
Zafirlukast 20 mg twice per day Chest xray today>>will call with results Will schedule breathing test (PFT) and call with results Follow up in 6 months

## 2011-06-25 ENCOUNTER — Telehealth: Payer: Self-pay | Admitting: Pulmonary Disease

## 2011-06-25 ENCOUNTER — Encounter: Payer: Self-pay | Admitting: Gastroenterology

## 2011-06-25 ENCOUNTER — Ambulatory Visit (INDEPENDENT_AMBULATORY_CARE_PROVIDER_SITE_OTHER): Payer: 59 | Admitting: Gastroenterology

## 2011-06-25 VITALS — BP 102/70 | HR 80 | Ht 61.0 in | Wt 206.4 lb

## 2011-06-25 DIAGNOSIS — K219 Gastro-esophageal reflux disease without esophagitis: Secondary | ICD-10-CM

## 2011-06-25 MED ORDER — ESOMEPRAZOLE MAGNESIUM 20 MG PO CPDR: 40.0000 mg | DELAYED_RELEASE_CAPSULE | Freq: Two times a day (BID) | ORAL | Status: DC

## 2011-06-25 NOTE — Patient Instructions (Signed)
Stay on nexium 1 pill twice daily (20-30 min before meals) Stay on bedtime pepcid. Call Dr. Christella Hartigan with any future problems.

## 2011-06-25 NOTE — Progress Notes (Signed)
Review of gastrointestinal problems:  1. Dysphasia, nausea 2009. EGD may 2009 was normal. Symptoms thought to be GERD related. Symptoms improved with addition of H2 blocker (March 2011). Barium esophagram 2011 was normal.  2. routine risk for colon cancer, colonoscopy may 2009: hemorrhoids only. Next colonoscopy may 2019; Intermittent IBS-like discomforts that are helped with antispasmodics usually.  3. post ERCP pancreatitis, 2000  4. laparoscopic cholecystectomy, 2000 5. GERD: EGD 12/12 with Bravo Wireless pH test 48 hours, done 05/02/11 while still on BID PPI (omeprazole) and bedtime H2 blocker was + for continued pathologic acid exposure.   HPI: This is a  very pleasant Dominique Mooney whom I last saw at the time of her wireless pH test about 2 months ago. Since then she is substituted Nexium for omeprazole. She then tried Protonix.   nexium has helped her much better than either omeprazole or protonix.  Otherwise feels burning pain, raw like pain in retrosternum.   Past Medical History  Diagnosis Date  . Hypothyroidism   . Irritable bowel syndrome   . GERD (gastroesophageal reflux disease)   . Pancreatitis     after ERCP  . Hyperlipidemia   . Allergic rhinitis     RAST 08/02/08 negative, IgE 63.9  . Asthma     PFT 01/05/08 FEV1 2.36 (106%), TLC 4.16 (92%), DLCO 89%, no BD response    Past Surgical History  Procedure Date  . Cholecystectomy   . Hernia repair   . Total abdominal hysterectomy   . Salpingoophorectomy   . Bravo ph study 05/02/2011    Procedure: BRAVO PH STUDY;  Surgeon: Rob Bunting, MD;  Location: [facility];  Service: Endoscopy;  Laterality: N/A;  48 hour wirless pH test (Bravo Test)  . Esophagogastroduodenoscopy 05/02/2011    Procedure: ESOPHAGOGASTRODUODENOSCOPY (EGD);  Surgeon: Rob Bunting, MD;  Location: [facility];  Service: Endoscopy;  Laterality: N/A;    Current Outpatient Prescriptions  Medication Sig Dispense Refill  . albuterol  (PROVENTIL HFA) 108 (90 BASE) MCG/ACT inhaler Inhale 2 puffs into the lungs every 6 (six) hours as needed.  1 Inhaler  11  . amLODipine (NORVASC) 5 MG tablet TAKE 1 TABLET BY MOUTH ONCE DAILY  90 tablet  3  . aspirin 81 MG tablet Take 81 mg by mouth daily.        . budesonide-formoterol (SYMBICORT) 160-4.5 MCG/ACT inhaler Inhale 2 puffs into the lungs 2 (two) times daily.  1 Inhaler  11  . esomeprazole (NEXIUM) 20 MG capsule Take 2 capsules (40 mg total) by mouth 2 (two) times daily.  30 capsule  1  . famotidine (PEPCID) 10 MG tablet Take 10 mg by mouth Nightly.        . fish oil-omega-3 fatty acids 1000 MG capsule Take 1 g by mouth daily.        Marland Kitchen levothyroxine (SYNTHROID, LEVOTHROID) 100 MCG tablet Take 100 mcg by mouth daily.        Marland Kitchen loratadine (CLARITIN) 10 MG tablet Take 10 mg by mouth daily as needed.       . Multiple Vitamin (MULTIVITAMIN) capsule Take 1 capsule by mouth daily.        Marland Kitchen NASONEX 50 MCG/ACT nasal spray TAKE 2 SPRAYS ONCE A DAY  17 g  1  . zafirlukast (ACCOLATE) 20 MG tablet Take 1 tablet (20 mg total) by mouth 2 (two) times daily.  60 tablet  5    Allergies as of 06/25/2011 - Review Complete 06/25/2011  Allergen Reaction Noted  .  Penicillins      Family History  Problem Relation Age of Onset  . Allergies Brother   . Allergies Sister   . Heart attack Brother   . Heart attack Brother   . Stroke Mother   . Stroke Father   . Allergies Son   . Colon cancer Neg Hx   . Stomach cancer Neg Hx   . Esophageal cancer Neg Hx     History   Social History  . Marital Status: Married    Spouse Name: N/A    Number of Children: 2  . 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 Smoking status: Never Smoker   . Smokeless tobacco: Never Used  . Alcohol Use: No  . Drug Use: No  . Sexually Active: Not on file   Other Topics Concern  . Not on file   Social History Narrative  . No narrative on file       Physical Exam: BP 102/70  Pulse 80  Ht 5\' 1"  (1.549 m)  Wt 206 lb 6.4 oz (93.622 kg)  BMI 39.00 kg/m2 Constitutional: generally well-appearing Psychiatric: alert and oriented x3 Abdomen: soft, nontender, nondistended, no obvious ascites, no peritoneal signs, normal bowel sounds     Assessment and plan: 61 y.o. female with GERD  Nexium clearly helps her the best of several proton pump inhibitors. She did have pathologic amounts of acid refluxing on her esophagus despite twice-daily PPI and nightly H2 blocker. She will stay on Nexium twice daily since it helps her symptoms quite well, she will stay on Pepcid nightly. She knows to call if she has any further problems.

## 2011-06-25 NOTE — Telephone Encounter (Signed)
Dg Chest 2 View  06/24/2011  *RADIOLOGY REPORT*  Clinical Data: Short of breath.  Asthma.  Chest tightness.  CHEST - 2 VIEW  Comparison: 07/23/2010  Findings: Heart size is normal.  Mediastinal shadows are normal. There may be mild bronchial thickening but there is no infiltrate, collapse or effusion.  Bony structures are unremarkable.  IMPRESSION: Possible mild bronchial thickening but no consolidation or collapse.  Original Report Authenticated By: Thomasenia Sales, M.D.   Will have my nurse inform patient that chest xray shows mild changes from asthma.  No other findings.  No change to current treatment plan.

## 2011-06-26 ENCOUNTER — Telehealth: Payer: Self-pay | Admitting: Pulmonary Disease

## 2011-06-26 NOTE — Telephone Encounter (Signed)
Lm for pt TCB x1 

## 2011-06-26 NOTE — Telephone Encounter (Signed)
I spoke with pt and wanted to verify her cxr results. Nothing further was needed

## 2011-06-26 NOTE — Telephone Encounter (Signed)
I spoke with patient about results and she verbalized understanding and had no questions 

## 2011-07-18 ENCOUNTER — Other Ambulatory Visit: Payer: Self-pay

## 2011-07-18 DIAGNOSIS — R002 Palpitations: Secondary | ICD-10-CM

## 2011-07-23 ENCOUNTER — Ambulatory Visit (INDEPENDENT_AMBULATORY_CARE_PROVIDER_SITE_OTHER): Payer: 59 | Admitting: Pulmonary Disease

## 2011-07-23 ENCOUNTER — Telehealth: Payer: Self-pay | Admitting: Pulmonary Disease

## 2011-07-23 DIAGNOSIS — J45909 Unspecified asthma, uncomplicated: Secondary | ICD-10-CM

## 2011-07-23 LAB — PULMONARY FUNCTION TEST

## 2011-07-23 NOTE — Progress Notes (Signed)
PFT done today. 

## 2011-07-23 NOTE — Telephone Encounter (Signed)
I spoke with the pt and she states over the last week she has been having a dull ache on the right and left side of her back under her shoulder blades. She states it is uncomfortable to lay flat on her back because this causes the pain to be worse. She denies any SOB, cough or congestion. She had a PFT test today and the pain did not get worse from this. She also denies doing anything to injure the area. She wanted to make Dr. Craige Cotta aware, and wanted to know could this be due to her asthma. I advised I will send message to Dr. Craige Cotta, but is the pain gets worse, or she develops any other symptoms such as SOB, or the pain radiates to go to urgent care or ER. Pt states understanding. Carron Curie, CMA

## 2011-07-25 NOTE — Telephone Encounter (Signed)
PFT 07/23/11>>FEV1 2.28(107%), FEV1% 79, TLC 4.61(102%), DLCO 82%, no BD.  Left message informing that PFT was normal.  Advised her to call back for questions, and if she is still having headache and back discomfort.

## 2011-07-30 ENCOUNTER — Encounter (INDEPENDENT_AMBULATORY_CARE_PROVIDER_SITE_OTHER): Payer: 59

## 2011-07-30 DIAGNOSIS — R002 Palpitations: Secondary | ICD-10-CM

## 2011-08-02 ENCOUNTER — Encounter: Payer: Self-pay | Admitting: Pulmonary Disease

## 2011-08-24 ENCOUNTER — Other Ambulatory Visit: Payer: Self-pay

## 2011-08-24 ENCOUNTER — Encounter (HOSPITAL_COMMUNITY): Payer: Self-pay | Admitting: Nurse Practitioner

## 2011-08-24 ENCOUNTER — Emergency Department (HOSPITAL_COMMUNITY): Payer: 59

## 2011-08-24 ENCOUNTER — Emergency Department (HOSPITAL_COMMUNITY)
Admission: EM | Admit: 2011-08-24 | Discharge: 2011-08-24 | Disposition: A | Discharge status: Home / Self Care | Payer: 59 | Attending: Emergency Medicine | Admitting: Emergency Medicine

## 2011-08-24 DIAGNOSIS — R079 Chest pain, unspecified: Secondary | ICD-10-CM | POA: Insufficient documentation

## 2011-08-24 DIAGNOSIS — K589 Irritable bowel syndrome without diarrhea: Secondary | ICD-10-CM | POA: Insufficient documentation

## 2011-08-24 DIAGNOSIS — K219 Gastro-esophageal reflux disease without esophagitis: Secondary | ICD-10-CM | POA: Insufficient documentation

## 2011-08-24 DIAGNOSIS — J45909 Unspecified asthma, uncomplicated: Secondary | ICD-10-CM | POA: Insufficient documentation

## 2011-08-24 DIAGNOSIS — Z79899 Other long term (current) drug therapy: Secondary | ICD-10-CM | POA: Insufficient documentation

## 2011-08-24 DIAGNOSIS — E039 Hypothyroidism, unspecified: Secondary | ICD-10-CM | POA: Insufficient documentation

## 2011-08-24 DIAGNOSIS — E785 Hyperlipidemia, unspecified: Secondary | ICD-10-CM | POA: Insufficient documentation

## 2011-08-24 LAB — POCT I-STAT TROPONIN I: Troponin i, poc: 0 ng/mL (ref 0.00–0.08)

## 2011-08-24 LAB — BASIC METABOLIC PANEL
GFR calc Af Amer: 86 mL/min — ABNORMAL LOW (ref >90)
GFR calc non Af Amer: 74 mL/min — ABNORMAL LOW (ref >90)
Potassium: 3.5 mEq/L (ref 3.5–5.1)
Sodium: 139 mEq/L (ref 135–145)

## 2011-08-24 LAB — CBC
MCHC: 34.2 g/dL (ref 30.0–36.0)
RDW: 12.8 % (ref 11.5–15.5)
WBC: 9 10*3/uL (ref 4.0–10.5)

## 2011-08-24 LAB — PRO B NATRIURETIC PEPTIDE: Pro B Natriuretic peptide (BNP): 66.4 pg/mL (ref 0–125)

## 2011-08-24 MED ORDER — ASPIRIN 81 MG PO CHEW
CHEWABLE_TABLET | ORAL | Status: AC
  Filled 2011-08-24: qty 2

## 2011-08-24 MED ORDER — ASPIRIN 325 MG PO TABS: 325.0000 mg | ORAL_TABLET | ORAL | Status: DC

## 2011-08-24 MED ORDER — GI COCKTAIL ~~LOC~~
30.0000 mL | Freq: Once | ORAL | Status: AC
  Administered 2011-08-24: 30 mL via ORAL
  Filled 2011-08-24: qty 30

## 2011-08-24 MED ORDER — KETOROLAC TROMETHAMINE 60 MG/2ML IM SOLN
60.0000 mg | Freq: Once | INTRAMUSCULAR | Status: AC
  Administered 2011-08-24: 60 mg via INTRAMUSCULAR
  Filled 2011-08-24: qty 2

## 2011-08-24 MED ORDER — NITROGLYCERIN 0.4 MG SL SUBL
0.4000 mg | SUBLINGUAL_TABLET | SUBLINGUAL | Status: DC | PRN
  Administered 2011-08-24: 0.4 mg via SUBLINGUAL
  Filled 2011-08-24: qty 25

## 2011-08-24 MED ORDER — NITROGLYCERIN 0.4 MG SL SUBL
0.4000 mg | SUBLINGUAL_TABLET | SUBLINGUAL | Status: DC | PRN
  Filled 2011-08-24: qty 25

## 2011-08-24 NOTE — ED Notes (Signed)
MD at bedside. EDP Guess

## 2011-08-24 NOTE — ED Notes (Signed)
Attempted to call report; was told that the nurse is not aware that she has an assignment yet and to call back.  Informed secretary that I will call back in 10 minutes.

## 2011-08-24 NOTE — ED Notes (Signed)
Dr. Kaylyn Layer at bedside; has decided to discharge patient.  3700 called and informed that patient is being discharged.

## 2011-08-24 NOTE — ED Notes (Signed)
Family at bedside. 

## 2011-08-24 NOTE — ED Provider Notes (Addendum)
61 year old female had onset about 10:30 AM of a left-sided chest tightness or pressure feeling which did radiate into the back and into the mid chest and somewhat into the arms. There is no associated dyspnea, nausea, diaphoresis. It is worse with deep breath and worse with twisting and turning. She has seen a cardiologist because of a tachyarrhythmia but not because of coronary artery disease. On exam, there is chest wall tenderness which does reproduce her pain. Her initial ECG showed some ST depression in the inferior leads which had not been present on an old ECG from 2008, but there was improvement but not complete resolution of the ST depression on the repeat ECG.   Date: 08/24/2011 1214  Rate: 107  Rhythm: sinus tachycardia  QRS Axis: normal  Intervals: normal  ST/T Wave abnormalities: ST depressions inferiorly  Conduction Disutrbances:none  Narrative Interpretation: Slight ST depression in the inferior leads of approximately 1 mm. When compared with ECG of 09/02/2006, ST depression is new.  Old EKG Reviewed: changes noted   Date: 08/24/2011 1501  Rate: 90  Rhythm: normal sinus rhythm  QRS Axis: normal  Intervals: normal  ST/T Wave abnormalities: ST depressions inferiorly  Conduction Disutrbances:none  Narrative Interpretation: Partial resolution of ST depression which is now only about 0.5 mm.  Old EKG Reviewed: changes noted    Dione Booze, MD 08/24/11 1624  ECG at 1728 shows normal sinus rhythm with a rate of 69, no ectopy. Normal axis. Normal P wave. Normal QRS. Normal intervals. Normal ST and T waves. Impression: normal ECG. All ST-T changes have resolved.   Dione Booze, MD 08/24/11 1740

## 2011-08-24 NOTE — ED Notes (Signed)
Patient given discharge paperwork; went over discharge instructions with patient.  Patient instructed to follow up with primary care physician and cardiologist as soon as possible, to continue home medications, and to return to the ED for new, worsening, or concerning symptoms.  Advised to keep Holter monitor attached.

## 2011-08-24 NOTE — ED Provider Notes (Signed)
I saw and evaluated the patient, reviewed the resident's note and I agree with the findings and plan.   Dione Booze, MD 08/24/11 [phone]

## 2011-08-24 NOTE — ED Notes (Signed)
Pt reports increase pain w/movement, deep breathing, and w/palpitation.

## 2011-08-24 NOTE — ED Notes (Signed)
Pt c/o (L) anterior chest tightness starting approx 1000 this am while doing laundry, pt took ASA 162 mg laid down and reports the pain radiating toward mid-sternum and through to middle of her back, dizziness, pt denies N/V, sob, diaphoresis, or abd pain. Pt reports her pain is currently radiating onto (R). Pt is wearing an external heart monitor d/t intermittent heart palpitations and sob since July 28 2011

## 2011-08-24 NOTE — ED Notes (Signed)
Report given to [facility], RN on 3700.  Informed Angelique Blonder that patient has not been seen by admitting physician, but that they EDP has put in temporary orders.  RN has no other questions or concerns; informed RN that she can call back with any questions.  Patient currently denies pain at this time.  Preparing patient for transport.

## 2011-08-24 NOTE — ED Notes (Signed)
MD at bedside. EDP Glick 

## 2011-08-24 NOTE — ED Provider Notes (Signed)
History     CSN: [id number]  Arrival date & time 08/24/11  1210   First MD Initiated Contact with Patient 08/24/11 1533      Chief Complaint  Patient presents with  . Chest Pain    (Consider location/radiation/quality/duration/timing/severity/associated sxs/prior treatment) HPI CC SS chest pressure pain, onset yesterday, worse with deep breath and movement of trunk, mild to moderate in severity, has been constant since onset > 24 hours ago.  No associated SOB, n/v, or sweating episodes.   Past Medical History  Diagnosis Date  . Hypothyroidism   . Irritable bowel syndrome   . GERD (gastroesophageal reflux disease)   . Pancreatitis     after ERCP  . Hyperlipidemia   . Allergic rhinitis     RAST 08/02/08 negative, IgE 63.9  . Asthma     PFT 01/05/08 FEV1 2.36 (106%), TLC 4.16 (92%), DLCO 89%, no BD response    Past Surgical History  Procedure Date  . Cholecystectomy   . Hernia repair   . Total abdominal hysterectomy   . Salpingoophorectomy   . Bravo ph study 05/02/2011    Procedure: BRAVO PH STUDY;  Surgeon: Rob Bunting, MD;  Location: [facility];  Service: Endoscopy;  Laterality: N/A;  48 hour wirless pH test (Bravo Test)  . Esophagogastroduodenoscopy 05/02/2011    Procedure: ESOPHAGOGASTRODUODENOSCOPY (EGD);  Surgeon: Rob Bunting, MD;  Location: [facility];  Service: Endoscopy;  Laterality: N/A;    Family History  Problem Relation Age of Onset  . Allergies Brother   . Allergies Sister   . Heart attack Brother   . Heart attack Brother   . Stroke Mother   . Stroke Father   . Allergies Son   . Colon cancer Neg Hx   . Stomach cancer Neg Hx   . Esophageal cancer Neg Hx     History  Substance Use Topics  . Smoking status: Never Smoker   . Smokeless tobacco: Never Used  . Alcohol Use: No    OB History    Grav Para Term Preterm Abortions TAB SAB Ect Mult Living                  Review of Systems  Constitutional: Negative for fever and chills.    Respiratory: Negative for cough and shortness of breath.   Cardiovascular: Positive for chest pain (pressure, radiates to back). Negative for palpitations and leg swelling.  Gastrointestinal: Negative for nausea, vomiting and abdominal pain.  All other systems reviewed and are negative.    Allergies  Penicillins  Home Medications   Current Outpatient Rx  Name Route Sig Dispense Refill  . ALBUTEROL SULFATE HFA 108 (90 BASE) MCG/ACT IN AERS Inhalation Inhale 2 puffs into the lungs every 6 (six) hours as needed. For shortness of breath    . AMLODIPINE BESYLATE 5 MG PO TABS Oral Take 5 mg by mouth daily.    . ASPIRIN EC 81 MG PO TBEC Oral Take 162 mg by mouth once.    . ASPIRIN EC 81 MG PO TBEC Oral Take 81 mg by mouth daily.    . BUDESONIDE-FORMOTEROL FUMARATE 160-4.5 MCG/ACT IN AERO Inhalation Inhale 2 puffs into the lungs 2 (two) times daily.    Marland Kitchen ESOMEPRAZOLE MAGNESIUM 20 MG PO CPDR Oral Take 40 mg by mouth 2 (two) times daily before a meal.    . OMEGA-3 FATTY ACIDS 1000 MG PO CAPS Oral Take 1 g by mouth daily.     Marland Kitchen LEVOTHYROXINE SODIUM 112 MCG  PO TABS Oral Take 112 mcg by mouth daily.    . ADULT MULTIVITAMIN W/MINERALS CH Oral Take 1 tablet by mouth daily.      BP 116/68  Pulse 73  Temp(Src) 98.1 F (36.7 C) (Oral)  Resp 16  Ht 5\' 1"  (1.549 m)  Wt 205 lb (92.987 kg)  BMI 38.73 kg/m2  SpO2 98%ra wnl  Physical Exam  Nursing note and vitals reviewed. Constitutional: She appears well-developed and well-nourished.  HENT:  Head: Normocephalic and atraumatic.  Eyes: Right eye exhibits no discharge. Left eye exhibits no discharge.  Neck: Normal range of motion. Neck supple.  Cardiovascular: Normal rate, regular rhythm and normal heart sounds.   Pulmonary/Chest: Effort normal and breath sounds normal. She exhibits tenderness.  Abdominal: Soft. There is no tenderness.  Musculoskeletal: She exhibits no edema and no tenderness.  Neurological: She is alert. GCS eye subscore is 4.  GCS verbal subscore is 5. GCS motor subscore is 6.  Skin: Skin is warm and dry.  Psychiatric: She has a normal mood and affect. Her behavior is normal.    ED Course  Procedures (including critical care time)  Labs Reviewed  BASIC METABOLIC PANEL - Abnormal; Notable for the following:    Glucose, Bld 111 (*)    GFR calc non Af Amer 74 (*)    GFR calc Af Amer 86 (*)    All other components within normal limits  CBC  PRO B NATRIURETIC PEPTIDE  POCT I-STAT TROPONIN I  POCT I-STAT TROPONIN I   Dg Chest 2 View  08/24/2011  *RADIOLOGY REPORT*  Clinical Data: Chest pain  CHEST - 2 VIEW  Comparison: 06/24/2011  Findings: Heart size is normal.  No pleural effusion or pulmonary edema.  No airspace consolidation is identified.  Review of the visualized osseous structures is unremarkable.  IMPRESSION:  1.  No active cardiopulmonary abnormalities.  Original Report Authenticated By: Rosealee Albee, M.D.     1. Chest pain       MDM  Pt is in nad, afvss, nontoxic appearing, exam and hx as above.  Pt here with > 24 hours of constant chest pressure that is worse with movement, no exertional component, and reproducible with palpation and with deep inspiration.  ekg shows nonspecific t wave abnormalitites but no change from previous ekg's.  She has a normal trop, which would be expected to be elevated with continuing chest pain for >24 hours if this were pain from myocardial ischemia.  She has h/o hlp and family hx of cad but no other risk factors for CAD.  She also had a normal stress test last year.  My suspicion for acs etiology is very low.  Her wells score for PE is 0, she has no sob, no s/sx of dvt, no tachycardia, no hypoxia, no hemoptysis, nonsmoker and not on hormones.  Doubt pe.  The pain does radiate through to her back but is not sharp and tearing, no widened mediastinum on cxr, no neuro sx's and no focal neuro deficits, will get BUE bp's, doubt dissection.  Doubt esophageal injury but could be  esophageal in origin will give gi cocktail.  Pt stable.  Trop wnl, as well as delta trop.  Dr Mayford Knife with cardiology consulted and recommends internal medicine admission.  Dr Kaylyn Layer consulted, will admit, pt stable.    2253 pt declines admission, understands risks of d/c, will f/u with her pcp   Elijio Miles, MD 08/24/11 1610  Elijio Miles, MD 08/24/11 2253

## 2011-08-24 NOTE — ED Notes (Signed)
Onset L sided CP while at rest at 1000 this am. Took 162 mg aspirin at that time. Then noticed pain radiating into center of chest and back. "feels like something is grabbing me inside my chest." wearing a holter monitor from dr cooper for similar complaint over past month. Noticed "funny feeling in my ch est" even with minimal activity over past weeks. A&Ox4, breathing easily

## 2011-08-24 NOTE — ED Notes (Signed)
Received bedside report from [place], [place].  Patient currently sitting up in bed; no respiratory or acute distress noted.  Patient updated on plan of care; informed patient that EDP has made consult to internal medicine.  Patient has no other questions or concerns at this time; will continue to monitor.

## 2011-08-24 NOTE — Discharge Instructions (Signed)
Chest Pain (Nonspecific) It is often hard to give a specific diagnosis for the cause of chest pain. There is always a chance that your pain could be related to something serious, such as a heart attack or a blood clot in the lungs. You need to follow up with your caregiver for further evaluation. CAUSES   Heartburn.   Pneumonia or bronchitis.   Anxiety or stress.   Inflammation around your heart (pericarditis) or lung (pleuritis or pleurisy).   A blood clot in the lung.   A collapsed lung (pneumothorax). It can develop suddenly on its own (spontaneous pneumothorax) or from injury (trauma) to the chest.   Shingles infection (herpes zoster virus).  The chest wall is composed of bones, muscles, and cartilage. Any of these can be the source of the pain.  The bones can be bruised by injury.   The muscles or cartilage can be strained by coughing or overwork.   The cartilage can be affected by inflammation and become sore (costochondritis).  DIAGNOSIS  Lab tests or other studies, such as X-rays, electrocardiography, stress testing, or cardiac imaging, may be needed to find the cause of your pain.  TREATMENT   Treatment depends on what may be causing your chest pain. Treatment may include:   Acid blockers for heartburn.   Anti-inflammatory medicine.   Pain medicine for inflammatory conditions.   Antibiotics if an infection is present.   You may be advised to change lifestyle habits. This includes stopping smoking and avoiding alcohol, caffeine, and chocolate.   You may be advised to keep your head raised (elevated) when sleeping. This reduces the chance of acid going backward from your stomach into your esophagus.   Most of the time, nonspecific chest pain will improve within 2 to 3 days with rest and mild pain medicine.  HOME CARE INSTRUCTIONS   If antibiotics were prescribed, take your antibiotics as directed. Finish them even if you start to feel better.   For the next few  days, avoid physical activities that bring on chest pain. Continue physical activities as directed.   Do not smoke.   Avoid drinking alcohol.   Only take over-the-counter or prescription medicine for pain, discomfort, or fever as directed by your caregiver.   Follow your caregiver's suggestions for further testing if your chest pain does not go away.   Keep any follow-up appointments you made. If you do not go to an appointment, you could develop lasting (chronic) problems with pain. If there is any problem keeping an appointment, you must call to reschedule.  SEEK MEDICAL CARE IF:   You think you are having problems from the medicine you are taking. Read your medicine instructions carefully.   Your chest pain does not go away, even after treatment.   You develop a rash with blisters on your chest.  SEEK IMMEDIATE MEDICAL CARE IF:   You have increased chest pain or pain that spreads to your arm, neck, jaw, back, or abdomen.   You develop shortness of breath, an increasing cough, or you are coughing up blood.   You have severe back or abdominal pain, feel nauseous, or vomit.   You develop severe weakness, fainting, or chills.   You have a fever.  THIS IS AN EMERGENCY. Do not wait to see if the pain will go away. Get medical help at once. Call your local emergency services (911 in [place] Do not drive yourself to the hospital. MAKE SURE YOU:   Understand these instructions.     Will watch your condition.   Will get help right away if you are not doing well or get worse.  Document Released: 02/13/2005 Document Revised: 04/25/2011 Document Reviewed: 12/10/2007 Exit[employer] Patient Information 2012 [employer].  Return for any new or worsening symptoms or any other concerns.  

## 2011-08-24 NOTE — ED Notes (Signed)
Patient currently sitting up in bed; no respiratory or acute distress noted.  Patient updated on plan of care; informed patient that EDP has made consult to cardiology and internal medicine.  Patient asked about chest pain; patient denies chest pain at this time.  Family is now at bedside.  Patient has no other questions or concerns; will continue to monitor.

## 2011-08-24 NOTE — Consult Note (Signed)
PCP:  Juline Patch, MD, MD  Tonny Bollman, cardiology  Chief Complaint:  Chest squeezing  HPI: 60yoF with h/o asthma, hypothryoidism, and currently wearing Holter monitor x1 month for  palpitations presents with chest squeezing sensation.   Pt reports that she has been wearing a Holter monitor for the past month due to a history of  sudden onset palpitations associated with SOB that has happened 1-2 times over the past couple  months. Dr. Excell Seltzer has followed this and she is supposed to follow up with him soon about the  findings. However, she now presents due to squeezing, substernal sensation that started this  morning, radiating into the left side of her chest, with associated SOB, but no nausea or  vomiting. She has had this occur over the past couple years and states she has had exercise stress  tests and what sounds like a nuclear stress test, and states they have all been normal. She denies  any h/o MI's, caths, CABG, or any known coronary artery disease. Furthermore, she carries a Dx of  asthma and does appear to get SOB with walking up her driveway which is [place].   In the ED vitals have been stable. Labs have shown normal chem panel, two negative Troponins 5.5  hours apart, negative CBC, negative CXR, and ECG's that have very minimal ST changes that are sub  1-mm. ED staff spoke with cardiologist on call who listened to the story, evaluated the EKG's and felt they were very unimpressive, advised admission to medicine, and stated cardiology could be consulted tomorrow if needed.  She was given nitroglycerin and her symptoms completely resolved, and she is completely chest pain free at this time.   ROS as above, otherwise with leg pain that has been evaluated by her PCP, otherwise negative.     Past Medical History  Diagnosis Date  . Hypothyroidism   . Irritable bowel syndrome   . GERD (gastroesophageal reflux disease)   . Pancreatitis     after ERCP  .  Hyperlipidemia   . Allergic rhinitis     RAST 08/02/08 negative, IgE 63.9  . Asthma     PFT 01/05/08 FEV1 2.36 (106%), TLC 4.16 (92%), DLCO 89%, no BD response    Past Surgical History  Procedure Date  . Cholecystectomy   . Hernia repair   . Total abdominal hysterectomy   . Salpingoophorectomy   . Bravo ph study 05/02/2011    Procedure: BRAVO PH STUDY;  Surgeon: Rob Bunting, MD;  Location: [facility];  Service: Endoscopy;  Laterality: N/A;  48 hour wirless pH test (Bravo Test)  . Esophagogastroduodenoscopy 05/02/2011    Procedure: ESOPHAGOGASTRODUODENOSCOPY (EGD);  Surgeon: Rob Bunting, MD;  Location: [facility];  Service: Endoscopy;  Laterality: N/A;    Medications:  HOME MEDS: Prior to Admission medications   Medication Sig Start Date End Date Taking? Authorizing Provider  albuterol (PROVENTIL HFA;VENTOLIN HFA) 108 (90 BASE) MCG/ACT inhaler Inhale 2 puffs into the lungs every 6 (six) hours as needed. For shortness of breath 10/30/10  Yes Coralyn Helling, MD  amLODipine (NORVASC) 5 MG tablet Take 5 mg by mouth daily.   Yes Historical Provider, MD  aspirin EC 81 MG tablet Take 162 mg by mouth once.   Yes Historical Provider, MD  aspirin EC 81 MG tablet Take 81 mg by mouth daily.   Yes Historical Provider, MD  budesonide-formoterol (SYMBICORT) 160-4.5 MCG/ACT inhaler Inhale 2 puffs into the lungs 2 (two) times daily. 10/30/10  Yes Vineet  Craige Cotta, MD  esomeprazole (NEXIUM) 20 MG capsule Take 40 mg by mouth 2 (two) times daily before a meal. 06/25/11 06/24/12 Yes Rachael Fee, MD  fish oil-omega-3 fatty acids 1000 MG capsule Take 1 g by mouth daily.    Yes Historical Provider, MD  levothyroxine (SYNTHROID, LEVOTHROID) 112 MCG tablet Take 112 mcg by mouth daily.   Yes Historical Provider, MD  Multiple Vitamin (MULITIVITAMIN WITH MINERALS) TABS Take 1 tablet by mouth daily.   Yes Historical Provider, MD    Allergies:  Allergies  Allergen Reactions  . Penicillins Rash    Social  History:   reports that she has never smoked. She has never used smokeless tobacco. She reports that she does not drink alcohol or use illicit drugs.  Family History: Family History  Problem Relation Age of Onset  . Allergies Brother   . Allergies Sister   . Heart attack Brother   . Heart attack Brother   . Stroke Mother   . Stroke Father   . Allergies Son   . Colon cancer Neg Hx   . Stomach cancer Neg Hx   . Esophageal cancer Neg Hx     Physical Exam: Filed Vitals:   08/24/11 2000 08/24/11 2045 08/24/11 2130 08/24/11 2245  BP: 109/63 112/71 116/68 128/76  Pulse: 72 74 73 73  Temp:   98.1 F (36.7 C)   TempSrc:   Oral   Resp: 12 18 16 19   Height:      Weight:      SpO2: 97% 100% 98% 100%   Blood pressure 128/76, pulse 73, temperature 98.1 F (36.7 C), temperature source Oral, resp. rate 19, height 5\' 1"  (1.549 m), weight 92.987 kg (205 lb), SpO2 100.00%. Gen: Pleasant, well appearing F in no distress, able to relate history well, is smiling and  pleasant, breathing comfortably HEENT: Pupils round, reactive, EOMI, sclera clear, mouth moist and normal appearing. Normal exam Lungs: CTAB no w/c/r, good air movement, no adventitious lung sounds, no wheezing, overall normal  exam Heart: Regular, not tachycardic, no m/g, no TTP of her chest wall, overall very normal exam Abd: Overweight, but soft, not tender, not distended, no facial grimacing to palpation, normal  exam Extrem: Warm, good perfusion, normal bulk and tone, no BLE edema noted, radials moderately well  palpated Neuro: Alert, attentive, conversant, normal exam, moves extremities spontaneously, grossly  nonfocal   Labs & Imaging Results for orders placed during the hospital encounter of 08/24/11 (from the past 48 hour(s))  CBC     Status: Normal   Collection Time   08/24/11  1:22 PM      Component Value Range Comment   WBC 9.0  4.0 - 10.5 (K/uL)    RBC 4.33  3.87 - 5.11 (MIL/uL)    Hemoglobin 12.9  12.0 - 15.0  (g/dL)    HCT 16.1  09.6 - 04.5 (%)    MCV 87.1  78.0 - 100.0 (fL)    MCH 29.8  26.0 - 34.0 (pg)    MCHC 34.2  30.0 - 36.0 (g/dL)    RDW 40.9  81.1 - 91.4 (%)    Platelets 329  150 - 400 (K/uL)   BASIC METABOLIC PANEL     Status: Abnormal   Collection Time   08/24/11  1:22 PM      Component Value Range Comment   Sodium 139  135 - 145 (mEq/L)    Potassium 3.5  3.5 - 5.1 (mEq/L)    Chloride  104  96 - 112 (mEq/L)    CO2 26  19 - 32 (mEq/L)    Glucose, Bld 111 (*) 70 - 99 (mg/dL)    BUN 13  6 - 23 (mg/dL)    Creatinine, Ser 0.98  0.50 - 1.10 (mg/dL)    Calcium 9.6  8.4 - 10.5 (mg/dL)    GFR calc non Af Amer 74 (*) >90 (mL/min)    GFR calc Af Amer 86 (*) >90 (mL/min)   PRO B NATRIURETIC PEPTIDE     Status: Normal   Collection Time   08/24/11  1:22 PM      Component Value Range Comment   Pro B Natriuretic peptide (BNP) 66.4  0 - 125 (pg/mL)   POCT I-STAT TROPONIN I     Status: Normal   Collection Time   08/24/11  1:51 PM      Component Value Range Comment   Troponin i, poc 0.00  0.00 - 0.08 (ng/mL)    Comment 3            POCT I-STAT TROPONIN I     Status: Normal   Collection Time   08/24/11  7:23 PM      Component Value Range Comment   Troponin i, poc 0.01  0.00 - 0.08 (ng/mL)    Comment 3             Dg Chest 2 View  08/24/2011  *RADIOLOGY REPORT*  Clinical Data: Chest pain  CHEST - 2 VIEW  Comparison: 06/24/2011  Findings: Heart size is normal.  No pleural effusion or pulmonary edema.  No airspace consolidation is identified.  Review of the visualized osseous structures is unremarkable.  IMPRESSION:  1.  No active cardiopulmonary abnormalities.  Original Report Authenticated By: Rosealee Albee, M.D.   Prior 08/2006: NSR 70 bpm, normal axis. Normal P and PR. No Q waves, narrow QRS. No ST deviations.  Flat appearing TW in V3 and III, otherwise normal. Overall, normal ECG.   ECG 4/6: NSR 107 bpm, normal axis. normal P and PR. No Q's, extremely minimal sub-91mm ST  depression V3-4, but  otherwise unremarkable and unchanged. Another repeat ECG later was basically normal.    Impression 60yoF with h/o asthma, hypothryoidism, and currently wearing Holter monitor x1 month for  palpitations presents with chest squeezing sensation.   There seems to be two things going on right now, and I'm not sure they are related. She describes  symptoms of an SVT, with sudden onset palpatiations, for which Dr. Excell Seltzer is following her with a  Holter, and for which she is supposed to f/u with him this week.   However, she also describes some chest squeezing sensation that is not clearly exertional and may  or may not be anginal. She reports having had at least 2-3 stress tests that are reportedly  negative for any CAD, that Dr. Excell Seltzer has also ordered in the past.   We were asked to admit this patient for a rule out MI. I explained to the patient that at present her ECG is unremarkable, and two sets of enzymes have been negative 5.5 hours apart, therefore there is no current evidence for ACS and I was quite unimpressed that she was having an acute MI. I offered to admit her for further monitoring and blood work, but she decided to go home, given her overall reassuring workup so far. She was also completely symptom free by the time  I evaluated her.   I  discussed with her that one option would be to repeat a stress test, however I told her that I  tend to doubt the utility of that as well, given several repeatedly negative tests by her account  with the same prior symptoms. This would likely have been done as an outpatient under Dr. Earmon Phoenix  orders anyways.   I explained that given her negative prior w/u's, this could be asthma-related air trapping and she  could try inhalers for her symtpoms. I did impart that she needs to f/u with Dr. Excell Seltzer early this  week which she understood. Therefore, she is being discharged from the ER by her request, but with  a thus far negative cardiac workup.        Dominique Mooney 08/24/2011, 11:24 PM

## 2011-08-24 NOTE — ED Notes (Signed)
Patient being transported upstairs on portable cardiac monitor with RN. 

## 2011-08-24 NOTE — ED Notes (Signed)
Admitting MD (Dr. Kaylyn Layer) at bedside; will transport patient upstairs as soon as admitting MD leaves bedside.

## 2011-08-26 ENCOUNTER — Encounter: Payer: Self-pay | Admitting: Nurse Practitioner

## 2011-08-26 ENCOUNTER — Telehealth: Payer: Self-pay | Admitting: Cardiovascular Disease

## 2011-08-26 ENCOUNTER — Ambulatory Visit (INDEPENDENT_AMBULATORY_CARE_PROVIDER_SITE_OTHER): Payer: 59 | Admitting: Nurse Practitioner

## 2011-08-26 VITALS — BP 145/94 | HR 93 | Ht 61.0 in | Wt 210.1 lb

## 2011-08-26 DIAGNOSIS — R0789 Other chest pain: Secondary | ICD-10-CM

## 2011-08-26 DIAGNOSIS — J45909 Unspecified asthma, uncomplicated: Secondary | ICD-10-CM

## 2011-08-26 DIAGNOSIS — I10 Essential (primary) hypertension: Secondary | ICD-10-CM

## 2011-08-26 MED ORDER — NITROGLYCERIN 0.4 MG SL SUBL: 0.4000 mg | SUBLINGUAL_TABLET | SUBLINGUAL | Status: DC | PRN

## 2011-08-26 NOTE — Telephone Encounter (Signed)
Pt woke up Saturday morning with a sharp pain in her chest. Pt took 2 ASA and then the pain went to the center of her chest. The pt proceeded to the ER for further evaluation.  The pt is still having discomfort in her chest and is not sure if this is due to her asthma. The pt will come into the office today for further evaluation.  The pt gets off of work at 2:00 and said she would just come to the office after getting out of work.  I made her aware that her appointment is scheduled at 3:20.

## 2011-08-26 NOTE — Progress Notes (Signed)
Patient Name: Dominique Mooney Date of Encounter: 08/26/2011  Primary Care Provider:  Juline Patch, MD, MD Primary Cardiologist:  Judie Petit. Excell Seltzer, MD  Patient Profile  61 y/o female w/ h/o atypical chest pain who presents with recurrence.  Problem List   Past Medical History  Diagnosis Date  . Hypothyroidism   . Irritable bowel syndrome   . GERD (gastroesophageal reflux disease)   . Pancreatitis     after ERCP  . Hyperlipidemia   . Allergic rhinitis     RAST 08/02/08 negative, IgE 63.9  . Asthma     a. PFT 01/05/08 FEV1 2.36 (106%), TLC 4.16 (92%), DLCO 89%, no BD response  . Chest pressure     a. Normal Stress Echo 05/2010   Past Surgical History  Procedure Date  . Cholecystectomy   . Hernia repair   . Total abdominal hysterectomy   . Salpingoophorectomy   . Bravo ph study 05/02/2011    Procedure: BRAVO PH STUDY;  Surgeon: Rob Bunting, MD;  Location: [facility];  Service: Endoscopy;  Laterality: N/A;  48 hour wirless pH test (Bravo Test)  . Esophagogastroduodenoscopy 05/02/2011    Procedure: ESOPHAGOGASTRODUODENOSCOPY (EGD);  Surgeon: Rob Bunting, MD;  Location: [facility];  Service: Endoscopy;  Laterality: N/A;    Allergies  Allergies  Allergen Reactions  . Penicillins Rash    HPI  61 y/o female with the above problem list.  Over the past few months, she has had intermittent chest tightness, for which she was seen in pulm clinic earlier this year and she was treated for asthma flare with resolution of dyspnea and chest tightness.  More recently, she had a few episodes of tachypalpitations and was placed on an event monitor.  She has had no recurrence of tachypalps and strips reviewed today showed no evidence of arrhythmia @ this time.  Starting last Friday, pt began to note a very mild focal discomfort in her mid-sternal area.  Initially, this was worse with position changes and palpation.  Ss acutely worsened on the morning of 4/6, and she presented to the [facility]  ED where troponins were normal and ECG was non-acute.  After about 10-11 hrs of ongoing discomfort, she was treated with sl NTG and IM Toradol with reduction in discomfort from 8/10 to 3/10.  In the absence of obj evidence of ischemia, pt was discharged from the ED.  Since then, she has cont to have mild, 2-3/10 focal chest discomfort.  It is no longer worsened by palpation or position changes and is not clearly worsened with exertion.  Because of these Ss, she is seen today.  Home Medications  Prior to Admission medications   Medication Sig Start Date End Date Taking? Authorizing Provider  albuterol (PROVENTIL HFA;VENTOLIN HFA) 108 (90 BASE) MCG/ACT inhaler Inhale 2 puffs into the lungs every 6 (six) hours as needed. For shortness of breath 10/30/10  Yes Coralyn Helling, MD  amLODipine (NORVASC) 5 MG tablet Take 5 mg by mouth daily.   Yes Historical Provider, MD  aspirin EC 81 MG tablet Take 162 mg by mouth once.   Yes Historical Provider, MD  budesonide-formoterol (SYMBICORT) 160-4.5 MCG/ACT inhaler Inhale 2 puffs into the lungs 2 (two) times daily. 10/30/10  Yes Coralyn Helling, MD  esomeprazole (NEXIUM) 20 MG capsule Take 40 mg by mouth 2 (two) times daily before a meal. 06/25/11 06/24/12 Yes Rachael Fee, MD  fish oil-omega-3 fatty acids 1000 MG capsule Take 1 g by mouth daily.    Yes  Historical Provider, MD  levothyroxine (SYNTHROID, LEVOTHROID) 112 MCG tablet Take 112 mcg by mouth daily.   Yes Historical Provider, MD  Multiple Vitamin (MULITIVITAMIN WITH MINERALS) TABS Take 1 tablet by mouth daily.   Yes Historical Provider, MD    Family History  Family History  Problem Relation Age of Onset  . Allergies Brother   . Allergies Sister   . Heart attack Brother   . Heart attack Brother   . Stroke Mother   . Stroke Father   . Allergies Son   . Colon cancer Neg Hx   . Stomach cancer Neg Hx   . Esophageal cancer Neg Hx     Social History  History   Social History  . Marital Status: Married      Spouse Name: N/A    Number of Children: 2  . 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 Smoking status: Never Smoker   . Smokeless tobacco: Never Used  . Alcohol Use: No  . Drug Use: No  . Sexually Active: Not on file   Other Topics Concern  . 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     Review of Systems General:  No chills, fever, night sweats or weight changes.  Cardiovascular:  +++chest pain as outlined in HPI.  She has had tachypalps but none since monitor placed.  +++ dyspnea on exertion.  No edema, orthopnea, palpitations, paroxysmal nocturnal dyspnea. Dermatological: No rash, lesions/masses Respiratory: No cough, dyspnea Urologic: No hematuria, dysuria Abdominal:   No nausea, vomiting, diarrhea, bright red blood per rectum, melena, or hematemesis Neurologic:  No visual changes, wkns, changes in mental status. All other systems reviewed and are otherwise negative except as noted above.  Physical Exam  Blood pressure 145/94, pulse 93, height 5\' 1"  (1.549 m), weight 210 lb 1.9 oz (95.31 kg).  General: Pleasant, NAD Psych: Normal affect. Neuro: Alert and oriented X 3. Moves all extremities spontaneously. HEENT: Normal  Neck: Supple without bruits or JVD. Lungs:  Resp regular and unlabored, CTA. Heart: RRR no s3, s4, or murmurs. Abdomen: Soft, non-tender, non-distended, BS + x 4.  Extremities: No clubbing, cyanosis or edema. DP/PT/Radials 2+ and equal bilaterally.  Accessory Clinical Findings  ECG - RSR, no acute st/t changes.  Assessment & Plan  1.  Atypical Chest Pain:  Pt has had focal, low-level midsternal chest discomfort since last Friday (4 days ago).  Initially Ss were worse with certain position changes and palpation.  Ss worsened acutely on Saturday AM and persisted at a high level throughout the day on  Saturday and were eventually relieved (reduced) after receiving NTG and Toradol in the ED (approx 11 hrs after onset).  She had two normal troponins and ecg was non-acute. She has continued to have low level focal discomfort, which does not limit her activity.  This is unlikely to be related to ischemia however she does have risk factors for coronary disease.  She had normal stress echo 1 year ago.  B/C of her risk factors, we will arrange for repeat exercise stress echo.  She'll continue taking ASA and we'll provide her with a Rx for SL NTG for worsening of Ss.    2.  HTN:  BP up today but pt says this typically runs in the 1-teens to 120's.  Will  not adjust meds today.  3.  Asthma:  No wheezing on exam.  She feels that her asthma Ss have been stable.  Don't think that this is causing chest discomfort.  4.  GERD:  She feels that this has been well-managed with nexium.  She can differentiate between current Ss and GERD Ss.  5.  Palpitations:  Currently wearing 30 day event monitor.  No recurrent palpitations.  Auto-trigger events show artifact.  Cont to monitor.  6.  Dispo:  Stress echo and f/u in 2-3 wks with Dr. Excell Seltzer.  Nicolasa Ducking, NP 08/26/2011, 3:11 PM

## 2011-08-26 NOTE — Patient Instructions (Addendum)
Your physician has requested that you have a stress echocardiogram. For further information please visit [email or web address] Please follow instruction sheet as given.   Your physician has recommended you make the following change in your medication: Start Nitroglycerin 0.4mg .  Place one tablet under the tongue for chest pain.  May repeat x 3 every 5 minutes.  If chest pain persists, call EMS  Your physician recommends that you schedule a follow-up appointment in: 2-3 weeks with Dr. Excell Seltzer, or you may see the PA on a day that Dr. Excell Seltzer is in the office if there is no availability on his schedule.

## 2011-08-26 NOTE — Telephone Encounter (Signed)
Patient would like return call  [phone]  Patient was in [facility] ER over the weekend for tightness in chest, SOB, patient would like to be seen ASAP as she is still have some chest discomfort.

## 2011-09-10 ENCOUNTER — Encounter (HOSPITAL_COMMUNITY): Payer: Self-pay | Admitting: *Deleted

## 2011-09-10 ENCOUNTER — Ambulatory Visit (HOSPITAL_COMMUNITY): Payer: 59 | Attending: Cardiovascular Disease

## 2011-09-10 DIAGNOSIS — R072 Precordial pain: Secondary | ICD-10-CM

## 2011-09-10 DIAGNOSIS — R002 Palpitations: Secondary | ICD-10-CM | POA: Insufficient documentation

## 2011-09-10 DIAGNOSIS — R0789 Other chest pain: Secondary | ICD-10-CM

## 2011-09-10 DIAGNOSIS — J45909 Unspecified asthma, uncomplicated: Secondary | ICD-10-CM | POA: Insufficient documentation

## 2011-09-10 DIAGNOSIS — E785 Hyperlipidemia, unspecified: Secondary | ICD-10-CM | POA: Insufficient documentation

## 2011-09-13 ENCOUNTER — Ambulatory Visit: Payer: 59 | Admitting: Cardiovascular Disease

## 2011-11-18 ENCOUNTER — Other Ambulatory Visit: Payer: Self-pay | Admitting: Obstetrics and Gynecology

## 2011-11-18 DIAGNOSIS — Z1231 Encounter for screening mammogram for malignant neoplasm of breast: Secondary | ICD-10-CM

## 2011-11-20 ENCOUNTER — Other Ambulatory Visit: Payer: Self-pay | Admitting: Internal Medicine

## 2011-11-20 DIAGNOSIS — I83819 Varicose veins of unspecified lower extremities with pain: Secondary | ICD-10-CM

## 2011-12-03 ENCOUNTER — Other Ambulatory Visit: Payer: 59

## 2011-12-03 ENCOUNTER — Telehealth: Payer: Self-pay | Admitting: Pulmonary Disease

## 2011-12-03 ENCOUNTER — Inpatient Hospital Stay
Admission: RE | Admit: 2011-12-03 | Discharge: 2011-12-03 | Payer: 59 | Source: Ambulatory Visit | Attending: Internal Medicine | Admitting: Internal Medicine

## 2011-12-03 NOTE — Telephone Encounter (Signed)
Pt states she has noticed some changes in her breathing and feels like she should see someone before her ov in Aug 2013 with VS. Pt doesn't geel she needs to be seen emergent and has been scheduled with TP on Fri., 12/06/11. Pt aware to contact our office if her symptoms change further or become worse.

## 2011-12-04 ENCOUNTER — Ambulatory Visit
Admission: RE | Admit: 2011-12-04 | Discharge: 2011-12-04 | Disposition: A | Discharge status: Home / Self Care | Payer: 59 | Source: Ambulatory Visit | Attending: Internal Medicine | Admitting: Internal Medicine

## 2011-12-04 VITALS — BP 119/80 | HR 81 | Temp 98.5°F | Resp 14 | Ht 61.0 in | Wt 204.0 lb

## 2011-12-04 DIAGNOSIS — I83819 Varicose veins of unspecified lower extremities with pain: Secondary | ICD-10-CM

## 2011-12-06 ENCOUNTER — Encounter: Payer: Self-pay | Admitting: Adult Health

## 2011-12-06 ENCOUNTER — Encounter: Payer: Self-pay | Admitting: Pulmonary Disease

## 2011-12-06 ENCOUNTER — Ambulatory Visit (INDEPENDENT_AMBULATORY_CARE_PROVIDER_SITE_OTHER): Payer: 59 | Admitting: Adult Health

## 2011-12-06 VITALS — BP 108/64 | HR 84 | Temp 97.3°F | Ht 61.0 in | Wt 207.8 lb

## 2011-12-06 DIAGNOSIS — J45909 Unspecified asthma, uncomplicated: Secondary | ICD-10-CM

## 2011-12-06 MED ORDER — PREDNISONE 10 MG PO TABS: ORAL_TABLET | ORAL | Status: DC

## 2011-12-06 MED ORDER — LEVALBUTEROL HCL 0.63 MG/3ML IN NEBU
0.6300 mg | INHALATION_SOLUTION | Freq: Once | RESPIRATORY_TRACT | Status: AC
  Administered 2011-12-06: 0.63 mg via RESPIRATORY_TRACT

## 2011-12-06 MED ORDER — MONTELUKAST SODIUM 10 MG PO TABS: 10.0000 mg | ORAL_TABLET | Freq: Every day | ORAL | Status: DC

## 2011-12-06 NOTE — Patient Instructions (Addendum)
Add Singulair 10mg  daily  Prednisone taper over next week.  Saline nasal rinses As needed   Continue on claritin daily  Follow up Dr. Craige Cotta  In 3 months weeks as planned and As needed   Please contact office for sooner follow up if symptoms do not improve or worsen or seek emergency care

## 2011-12-06 NOTE — Assessment & Plan Note (Signed)
Flare with AR   Plan;  add Singulair 10mg  daily  Prednisone taper over next week.  Saline nasal rinses As needed   Continue on claritin daily  Follow up Dr. Craige Cotta  In 3 months weeks as planned and As needed   Please contact office for sooner follow up if symptoms do not improve or worsen or seek emergency care

## 2011-12-06 NOTE — Progress Notes (Signed)
  Subjective:    Patient ID: Dominique Mooney, female    DOB: 1950/06/20, 61 y.o.   MRN: [id number]  HPI  16 with known hx of chronic cough and rhinitis    12/06/2011 Acute OV  Complains of difficulty breathing x 1 week.  Complains of chest tightness, and  dry cough x 2 weeks Worse with hot humid weather.  Never started accolate that was given at last ov.  No fever or discolored mucus  No hemoptysis  Increased SABA use.   Was at ER in April with atypical chest pain , r/o for MI  Seen cardiology with stress echo neg for ischemia      Review of Systems  Constitutional:   No  weight loss, night sweats,  Fevers, chills,  +fatigue, or  lassitude.  HEENT:   No headaches,  Difficulty swallowing,  Tooth/dental problems, or  Sore throat,                + sneezing, itching,   nasal congestion, post nasal drip,   CV:  No  Orthopnea, PND, swelling in lower extremities, anasarca, dizziness, palpitations, syncope.   GI  No heartburn, indigestion, abdominal pain, nausea, vomiting, diarrhea, change in bowel habits, loss of appetite, bloody stools.   Resp: No coughing up of blood.    No chest wall deformity  Skin: no rash or lesions.  GU: no dysuria, change in color of urine, no urgency or frequency.  No flank pain, no hematuria   MS:  No joint pain or swelling.  No decreased range of motion.  No back pain.  Psych:  No change in mood or affect. No depression or anxiety.  No memory loss.         Objective:   Physical Exam  GEN: A/Ox3; pleasant , NAD,   HEENT:  [place]/AT,  EACs-clear, TMs-wnl, NOSE-clear drainage,   THROAT-clear, no lesions, no postnasal drip or exudate noted.   NECK:  Supple w/ fair ROM; no JVD; normal carotid impulses w/o bruits; no thyromegaly or nodules palpated; no lymphadenopathy.  RESP  Coarse BS w/ no wheeizng .no accessory muscle use, no dullness to percussion  CARD:  RRR, no m/r/g  , no peripheral edema, pulses intact, no cyanosis or clubbing.  GI:   Soft &  nt; nml bowel sounds; no organomegaly or masses detected.  Musco: Warm bil, no deformities or joint swelling noted.   Neuro: alert, no focal deficits noted.    Skin: Warm, no lesions or rashes       Assessment & Plan:

## 2011-12-08 NOTE — Progress Notes (Signed)
Reviewed and agree with assessment/plan. 

## 2011-12-27 ENCOUNTER — Other Ambulatory Visit: Payer: Self-pay | Admitting: Pulmonary Disease

## 2012-01-13 ENCOUNTER — Ambulatory Visit: Payer: 59 | Admitting: Pulmonary Disease

## 2012-01-17 ENCOUNTER — Ambulatory Visit: Payer: 59

## 2012-01-28 ENCOUNTER — Other Ambulatory Visit: Payer: Self-pay | Admitting: Internal Medicine

## 2012-01-28 DIAGNOSIS — R109 Unspecified abdominal pain: Secondary | ICD-10-CM

## 2012-01-30 ENCOUNTER — Ambulatory Visit
Admission: RE | Admit: 2012-01-30 | Discharge: 2012-01-30 | Disposition: A | Discharge status: Home / Self Care | Payer: 59 | Source: Ambulatory Visit | Attending: Internal Medicine | Admitting: Internal Medicine

## 2012-01-30 DIAGNOSIS — R109 Unspecified abdominal pain: Secondary | ICD-10-CM

## 2012-01-31 ENCOUNTER — Ambulatory Visit: Payer: 59 | Admitting: Pulmonary Disease

## 2012-03-03 ENCOUNTER — Ambulatory Visit (INDEPENDENT_AMBULATORY_CARE_PROVIDER_SITE_OTHER): Payer: 59 | Admitting: Pulmonary Disease

## 2012-03-03 ENCOUNTER — Encounter: Payer: Self-pay | Admitting: Pulmonary Disease

## 2012-03-03 VITALS — BP 108/70 | HR 83 | Temp 98.0°F | Ht 61.0 in | Wt 211.8 lb

## 2012-03-03 DIAGNOSIS — Z23 Encounter for immunization: Secondary | ICD-10-CM

## 2012-03-03 DIAGNOSIS — J309 Allergic rhinitis, unspecified: Secondary | ICD-10-CM

## 2012-03-03 DIAGNOSIS — J45909 Unspecified asthma, uncomplicated: Secondary | ICD-10-CM

## 2012-03-03 NOTE — Addendum Note (Signed)
Addended by: Tommie Sams on: 03/03/2012 04:44 PM   Modules accepted: Orders

## 2012-03-03 NOTE — Assessment & Plan Note (Signed)
She has been doing since being on combination of symbicort and singulair.  She can continue albuterol as needed.  She will get her flu shot at work.

## 2012-03-03 NOTE — Progress Notes (Signed)
Chief Complaint  Patient presents with  . Follow-up    breathing is better since being on singulair. denies any wheezing, chest tx, cough.     History of Present Illness: Dominique Mooney is a 61 y.o. female with asthma and allergic rhinitis.  She was seen in July by Rubye Oaks for an exacerbation.  She was started on singulair and given prednisone.  She has been doing better since being started on singulair.  She is not needing to use her albuterol much.  She denies cough, wheeze, sputum, or chest pain.    She is keeping up with her activities well.  She had some sinus congestion a week ago.  She took an OTC sinus medicine for one day, and was better after that.   Tests: PFT 01/05/08>>FEV1 2.36 (106%), TLC 4.16 (92%), DLCO 89%, no BD response RAST 08/02/08>>negative, IgE 63.9 PFT 07/23/11>>FEV1 2.28(107%), FEV1% 79, TLC 4.61(102%), DLCO 82%, no BD.   Past Medical History  Diagnosis Date  . Hypothyroidism   . Irritable bowel syndrome   . GERD (gastroesophageal reflux disease)   . Pancreatitis     after ERCP  . Hyperlipidemia   . Allergic rhinitis        . Asthma        . Chest pressure     a. Normal Stress Echo 05/2010    Past Surgical History  Procedure Date  . Cholecystectomy   . Hernia repair   . Total abdominal hysterectomy   . Salpingoophorectomy   . Bravo ph study 05/02/2011    Procedure: BRAVO PH STUDY;  Surgeon: Rob Bunting, MD;  Location: [facility];  Service: Endoscopy;  Laterality: N/A;  48 hour wirless pH test (Bravo Test)  . Esophagogastroduodenoscopy 05/02/2011    Procedure: ESOPHAGOGASTRODUODENOSCOPY (EGD);  Surgeon: Rob Bunting, MD;  Location: [facility];  Service: Endoscopy;  Laterality: N/A;    Allergies  Allergen Reactions  . Penicillins Rash    Physical Exam:  Filed Vitals:   03/03/12 1457 03/03/12 1500  BP:  108/70  Pulse:  83  Temp: 98 F (36.7 C)   TempSrc: Oral   Height: 5\' 1"  (1.549 m)   Weight: 211 lb 12.8 oz (96.072  kg)   SpO2:  98%    Body mass index is 40.02 kg/(m^2).  Wt Readings from Last 2 Encounters:  03/03/12 211 lb 12.8 oz (96.072 kg)  12/06/11 207 lb 12.8 oz (94.257 kg)    General - No distress HEENT - no sinus tenderness, clear nasal discharge, no oral exudate Cardiac - s1s2 regular Chest - no wheeze/rales/tenderness Abdomen - soft, nontender Extremities - no edema Skin - no rashes Neurologic - normal strength Psychiatric - normal mood, behavior   Assessment/Plan:  Outpatient Encounter Prescriptions as of 03/03/2012  Medication Sig Dispense Refill  . albuterol (PROVENTIL HFA;VENTOLIN HFA) 108 (90 BASE) MCG/ACT inhaler Inhale 2 puffs into the lungs every 6 (six) hours as needed. For shortness of breath      . amLODipine (NORVASC) 5 MG tablet Take 5 mg by mouth daily.      Marland Kitchen aspirin EC 81 MG tablet Take 162 mg by mouth once.      . budesonide-formoterol (SYMBICORT) 160-4.5 MCG/ACT inhaler Inhale 2 puffs into the lungs 2 (two) times daily.      Marland Kitchen esomeprazole (NEXIUM) 20 MG capsule Take 40 mg by mouth 2 (two) times daily before a meal.      . fish oil-omega-3 fatty acids 1000 MG capsule Take 1  g by mouth daily.       Marland Kitchen levothyroxine (SYNTHROID, LEVOTHROID) 112 MCG tablet Take 112 mcg by mouth daily.      . montelukast (SINGULAIR) 10 MG tablet Take 1 tablet (10 mg total) by mouth at bedtime.  90 tablet  3  . Multiple Vitamin (MULITIVITAMIN WITH MINERALS) TABS Take 1 tablet by mouth daily.      . nitroGLYCERIN (NITROSTAT) 0.4 MG SL tablet Place 1 tablet (0.4 mg total) under the tongue every 5 (five) minutes as needed for chest pain.  50 tablet  1  . DISCONTD: predniSONE (DELTASONE) 10 MG tablet 4 tabs for 2 days, then 3 tabs for 2 days, 2 tabs for 2 days, then 1 tab for 2 days, then stop  20 tablet  0  . DISCONTD: SYMBICORT 160-4.5 MCG/ACT inhaler INHALE 2 PUFFS INTO THE LUNGS 2 (TWO) TIMES DAILY.  1 Inhaler  3    Rayleigh Gillyard Pager:  [phone] 03/03/2012, 3:24 PM

## 2012-03-03 NOTE — Patient Instructions (Signed)
Pneumonia shot today Follow up in 6 months Call if help needed sooner

## 2012-03-03 NOTE — Assessment & Plan Note (Signed)
Stable

## 2012-03-18 ENCOUNTER — Ambulatory Visit
Admission: RE | Admit: 2012-03-18 | Discharge: 2012-03-18 | Disposition: A | Discharge status: Home / Self Care | Payer: 59 | Source: Ambulatory Visit | Attending: Obstetrics and Gynecology | Admitting: Obstetrics and Gynecology

## 2012-03-18 DIAGNOSIS — Z1231 Encounter for screening mammogram for malignant neoplasm of breast: Secondary | ICD-10-CM

## 2012-03-20 ENCOUNTER — Other Ambulatory Visit: Payer: Self-pay | Admitting: Obstetrics and Gynecology

## 2012-03-20 DIAGNOSIS — R928 Other abnormal and inconclusive findings on diagnostic imaging of breast: Secondary | ICD-10-CM

## 2012-03-31 ENCOUNTER — Ambulatory Visit
Admission: RE | Admit: 2012-03-31 | Discharge: 2012-03-31 | Disposition: A | Discharge status: Home / Self Care | Payer: 59 | Source: Ambulatory Visit | Attending: Obstetrics and Gynecology | Admitting: Obstetrics and Gynecology

## 2012-03-31 DIAGNOSIS — R928 Other abnormal and inconclusive findings on diagnostic imaging of breast: Secondary | ICD-10-CM

## 2012-05-27 ENCOUNTER — Other Ambulatory Visit: Payer: Self-pay | Admitting: Obstetrics and Gynecology

## 2012-05-27 ENCOUNTER — Other Ambulatory Visit (HOSPITAL_COMMUNITY)
Admission: RE | Admit: 2012-05-27 | Discharge: 2012-05-27 | Disposition: A | Discharge status: Home / Self Care | Payer: 59 | Source: Ambulatory Visit | Attending: Obstetrics and Gynecology | Admitting: Obstetrics and Gynecology

## 2012-05-27 DIAGNOSIS — Z1151 Encounter for screening for human papillomavirus (HPV): Secondary | ICD-10-CM | POA: Insufficient documentation

## 2012-05-27 DIAGNOSIS — Z01419 Encounter for gynecological examination (general) (routine) without abnormal findings: Secondary | ICD-10-CM | POA: Insufficient documentation

## 2012-06-01 ENCOUNTER — Ambulatory Visit (INDEPENDENT_AMBULATORY_CARE_PROVIDER_SITE_OTHER): Payer: Commercial Managed Care - PPO | Admitting: General Surgery

## 2012-06-01 ENCOUNTER — Encounter (INDEPENDENT_AMBULATORY_CARE_PROVIDER_SITE_OTHER): Payer: Self-pay | Admitting: General Surgery

## 2012-06-01 ENCOUNTER — Ambulatory Visit (INDEPENDENT_AMBULATORY_CARE_PROVIDER_SITE_OTHER): Payer: 59 | Admitting: General Surgery

## 2012-06-01 VITALS — BP 130/72 | HR 84 | Temp 97.6°F | Resp 16 | Ht 61.0 in | Wt 206.6 lb

## 2012-06-01 DIAGNOSIS — N644 Mastodynia: Secondary | ICD-10-CM

## 2012-06-01 NOTE — Progress Notes (Signed)
Chief Complaint  Patient presents with  . New Evaluation    eval lt br benign mass/ painful    HISTORY: Patient is a 62 year old female with a history of breast pain on and off for many years. She says typically does it's worse when she has her mammogram and then it gets better after several weeks. This has been compounded recently because she has been called back for diagnostic mammograms and has gotten a few six-month mammograms.  She does have pain that is so significant that she has to take ibuprofen or Tylenol at times. She has stopped drinking caffeine and eating chocolate.  She has not noticed that activity seems to change the discomfort. She is postmenopausal and not on hormones.  Past Medical History  Diagnosis Date  . Hypothyroidism   . Irritable bowel syndrome   . GERD (gastroesophageal reflux disease)   . Pancreatitis     after ERCP  . Hyperlipidemia   . Allergic rhinitis        . Asthma        . Chest pressure     a. Normal Stress Echo 05/2010    Past Surgical History  Procedure Date  . Cholecystectomy   . Hernia repair   . Total abdominal hysterectomy   . Salpingoophorectomy   . Bravo ph study 05/02/2011    Procedure: BRAVO PH STUDY;  Surgeon: Rob Bunting, MD;  Location: [facility];  Service: Endoscopy;  Laterality: N/A;  48 hour wirless pH test (Bravo Test)  . Esophagogastroduodenoscopy 05/02/2011    Procedure: ESOPHAGOGASTRODUODENOSCOPY (EGD);  Surgeon: Rob Bunting, MD;  Location: [facility];  Service: Endoscopy;  Laterality: N/A;    Current Outpatient Prescriptions  Medication Sig Dispense Refill  . aspirin EC 81 MG tablet Take 162 mg by mouth once.      . budesonide-formoterol (SYMBICORT) 160-4.5 MCG/ACT inhaler Inhale 2 puffs into the lungs 2 (two) times daily.      . fish oil-omega-3 fatty acids 1000 MG capsule Take 1 g by mouth daily.       Marland Kitchen levothyroxine (SYNTHROID, LEVOTHROID) 112 MCG tablet Take 112 mcg by mouth daily.      . Multiple Vitamin  (MULITIVITAMIN WITH MINERALS) TABS Take 1 tablet by mouth daily.      Marland Kitchen albuterol (PROVENTIL HFA;VENTOLIN HFA) 108 (90 BASE) MCG/ACT inhaler Inhale 2 puffs into the lungs every 6 (six) hours as needed. For shortness of breath      . amLODipine (NORVASC) 5 MG tablet Take 5 mg by mouth daily.      Marland Kitchen esomeprazole (NEXIUM) 20 MG capsule Take 40 mg by mouth 2 (two) times daily before a meal.      . [DISCONTINUED] esomeprazole (NEXIUM) 20 MG capsule Take 2 capsules (40 mg total) by mouth 2 (two) times daily before a meal.  180 capsule  3     Allergies  Allergen Reactions  . Penicillins Rash     Family History  Problem Relation Age of Onset  . Allergies Brother   . Allergies Sister   . Heart attack Brother   . Heart attack Brother   . Stroke Mother   . Stroke Father   . Allergies Son   . Colon cancer Neg Hx   . Stomach cancer Neg Hx   . Esophageal cancer Neg Hx      History   Social History  . Marital Status: Married    Spouse Name: N/A    Number of Children: 2  .  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 Smoking status: Never Smoker   . Smokeless tobacco: Never Used  . Alcohol Use: No  . Drug Use: No  . Sexually Active: None   Other Topics Concern  . 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     REVIEW OF SYSTEMS - PERTINENT POSITIVES ONLY: 12 point review of systems negative other than HPI and PMH   EXAM: Filed Vitals:   06/01/12 1349  BP: 130/72  Pulse: 84  Temp: 97.6 F (36.4 C)  Resp: 16    Gen:  No acute distress.  Well nourished and well groomed.   Neurological: Alert and oriented to person, place, and time. Coordination normal.  Head: Normocephalic and atraumatic.  Eyes: Conjunctivae are normal. Pupils are equal, round, and reactive to light. No scleral icterus.  Neck: Normal range of motion. Neck supple. No  tracheal deviation or thyromegaly present.  Cardiovascular: Normal rate, regular rhythm, normal heart sounds and intact distal pulses.  Exam reveals no gallop and no friction rub.  No murmur heard. Respiratory: Effort normal.  No respiratory distress. No chest wall tenderness. Breath sounds normal.  No wheezes, rales or rhonchi.  Breasts:  Both breasts are heterogeneously dense bilaterally.  They are symmetric bilaterally.  There are no masses or skin dimpling.  Both breasts are tender, especially on the dense areas.   GI: Soft. Bowel sounds are normal. The abdomen is soft and nontender.  There is no rebound and no guarding.  Musculoskeletal: Normal range of motion. Extremities are nontender.  Lymphadenopathy: No cervical, preauricular, postauricular or axillary adenopathy is present Skin: Skin is warm and dry. No rash noted. No diaphoresis. No erythema. No pallor. No clubbing, cyanosis, or edema.   Psychiatric: Normal mood and affect. Behavior is normal. Judgment and thought content normal.    LABORATORY RESULTS: Available labs are reviewed    RADIOLOGY RESULTS:  See E-Chart or I-Site for most recent results.  Images and reports are reviewed.  Breast ultrasound 03/31/12 Findings: Ultrasound is performed, showing there is a well-  circumscribed, hypoechoic nodule with increased through  transmission in the left breast at 3 o'clock 4 cm from the nipple  measuring 4 x 2 x 4 mm. This is thought to likely be benign and  may represent a small cyst or fibroadenoma. BIRADS 3    ASSESSMENT AND PLAN: Mastodynia Patient with mastodynia likely secondary to fibrocystic change of the breasts. Left breast is worse.  The lesion that she has on her ultrasound is 4 x 2 x 4 mm.  This was felt to be either a small cyst or fibroadenoma. Both of these are likely symptoms of the fibrocystic change rather than etiologies of the pain.  She is given follow in May with the repeat diagnostic mammogram and  ultrasound.   She will followup with me in 6 months if she is still having problems or if the ultrasound shows something that is concerning. I advised her to take 400 mg of ibuprofen 3 times a day with food. I also advised her to add vitamin E or primrose oil as well to her abstinence from caffeine and chocolate.       Maudry Diego MD Surgical Oncology, General and Endocrine Surgery [facility]      Visit Diagnoses: 1.  Mastodynia     Primary Care Physician: Juline Patch, MD

## 2012-06-01 NOTE — Patient Instructions (Signed)
Take 400 mg ibuprofen 3 x/day with food for 2 weeks.  Add vitamin E or primrose oil.  Follow up mammogram in May  Follow up with me as needed in 6 months.

## 2012-06-01 NOTE — Assessment & Plan Note (Signed)
Patient with mastodynia likely secondary to fibrocystic change of the breasts. Left breast is worse.  The lesion that she has on her ultrasound is 4 x 2 x 4 mm.  This was felt to be either a small cyst or fibroadenoma. Both of these are likely symptoms of the fibrocystic change rather than etiologies of the pain.  She is given follow in May with the repeat diagnostic mammogram and ultrasound.   She will followup with me in 6 months if she is still having problems or if the ultrasound shows something that is concerning. I advised her to take 400 mg of ibuprofen 3 times a day with food. I also advised her to add vitamin E or primrose oil as well to her abstinence from caffeine and chocolate.

## 2012-06-12 ENCOUNTER — Other Ambulatory Visit (HOSPITAL_COMMUNITY): Payer: Self-pay | Admitting: Neurological Surgery

## 2012-06-12 DIAGNOSIS — M545 Low back pain: Secondary | ICD-10-CM

## 2012-06-16 ENCOUNTER — Ambulatory Visit (HOSPITAL_COMMUNITY)
Admission: RE | Admit: 2012-06-16 | Discharge: 2012-06-16 | Disposition: A | Discharge status: Home / Self Care | Payer: 59 | Source: Ambulatory Visit | Attending: Neurological Surgery | Admitting: Neurological Surgery

## 2012-06-16 DIAGNOSIS — R209 Unspecified disturbances of skin sensation: Secondary | ICD-10-CM | POA: Insufficient documentation

## 2012-06-16 DIAGNOSIS — M79609 Pain in unspecified limb: Secondary | ICD-10-CM | POA: Insufficient documentation

## 2012-06-16 DIAGNOSIS — M545 Low back pain, unspecified: Secondary | ICD-10-CM | POA: Insufficient documentation

## 2012-06-16 DIAGNOSIS — M47817 Spondylosis without myelopathy or radiculopathy, lumbosacral region: Secondary | ICD-10-CM | POA: Insufficient documentation

## 2012-06-25 ENCOUNTER — Ambulatory Visit: Payer: 59 | Admitting: Internal Medicine

## 2012-06-26 ENCOUNTER — Other Ambulatory Visit: Payer: Self-pay | Admitting: Gastroenterology

## 2012-06-26 ENCOUNTER — Other Ambulatory Visit: Payer: Self-pay | Admitting: Cardiovascular Disease

## 2012-06-26 ENCOUNTER — Other Ambulatory Visit: Payer: Self-pay

## 2012-06-26 ENCOUNTER — Other Ambulatory Visit: Payer: Self-pay | Admitting: Pulmonary Disease

## 2012-06-26 MED ORDER — PANTOPRAZOLE SODIUM 40 MG PO TBEC: 40.0000 mg | DELAYED_RELEASE_TABLET | Freq: Every day | ORAL | Status: DC

## 2012-07-15 ENCOUNTER — Ambulatory Visit: Payer: 59 | Admitting: Pulmonary Disease

## 2012-08-25 ENCOUNTER — Other Ambulatory Visit: Payer: Self-pay | Admitting: Obstetrics and Gynecology

## 2012-08-25 DIAGNOSIS — N649 Disorder of breast, unspecified: Secondary | ICD-10-CM

## 2012-08-26 ENCOUNTER — Encounter: Payer: Self-pay | Admitting: Adult Health

## 2012-08-26 ENCOUNTER — Ambulatory Visit (INDEPENDENT_AMBULATORY_CARE_PROVIDER_SITE_OTHER): Admitting: Adult Health

## 2012-08-26 VITALS — BP 124/82 | HR 95 | Temp 98.2°F | Ht 61.0 in | Wt 205.8 lb

## 2012-08-26 DIAGNOSIS — J019 Acute sinusitis, unspecified: Secondary | ICD-10-CM

## 2012-08-26 MED ORDER — AZITHROMYCIN 250 MG PO TABS: ORAL_TABLET | ORAL | Status: AC

## 2012-08-26 NOTE — Progress Notes (Signed)
  Subjective:    Patient ID: Dominique Mooney, female    DOB: September 03, 1950, 62 y.o.   MRN: [id number]  HPI  33 with known hx of chronic cough and rhinitis    08/26/2012  Acute OV  Complains of sneezing, head congestion with soreness in sinus, HA, right ear discomfort, PND, increased SOB, wheezing, dry cough onset yesterday evening - denies mucus production, f/c/s. No increased SABA use. No wheezing .  Cough is minimal .  Right sinus pressure w/ ear pain.  No otc used.   Just retired from [employer] with 25 yrs of service. Husband retired -plans to travel this summer. Has good insurance plan with [facility] system from Husband.  Review of Systems  Constitutional:   No  weight loss, night sweats,  Fevers, chills,  +fatigue, or  lassitude.  HEENT:   No headaches,  Difficulty swallowing,  Tooth/dental problems, or  Sore throat,                + sneezing, itching,   nasal congestion, post nasal drip,   CV:  No  Orthopnea, PND, swelling in lower extremities, anasarca, dizziness, palpitations, syncope.   GI  No heartburn, indigestion, abdominal pain, nausea, vomiting, diarrhea, change in bowel habits, loss of appetite, bloody stools.   Resp: No coughing up of blood.    No chest wall deformity  Skin: no rash or lesions.  GU: no dysuria, change in color of urine, no urgency or frequency.  No flank pain, no hematuria   MS:  No joint pain or swelling.  No decreased range of motion.  No back pain.  Psych:  No change in mood or affect. No depression or anxiety.  No memory loss.         Objective:   Physical Exam  GEN: A/Ox3; pleasant , NAD,   HEENT:  [place]/AT,  EACs-clear, TMs-wnl, NOSE-clear drainage,   Maxillary sinus tenderness , THROAT-clear, no lesions, no postnasal drip or exudate noted.   NECK:  Supple w/ fair ROM; no JVD; normal carotid impulses w/o bruits; no thyromegaly or nodules palpated; no lymphadenopathy.  RESP  CTA w/ no wheeizng .no accessory muscle use, no dullness to percussion  CARD:   RRR, no m/r/g  , no peripheral edema, pulses intact, no cyanosis or clubbing.  GI:   Soft & nt; nml bowel sounds; no organomegaly or masses detected.  Musco: Warm bil, no deformities or joint swelling noted.   Neuro: alert, no focal deficits noted.    Skin: Warm, no lesions or rashes       Assessment & Plan:

## 2012-08-26 NOTE — Assessment & Plan Note (Signed)
Zpack take as directed.  Mucinex Twice daily  As needed  Congestion  Saline nasal rinses As needed   Afrin 2 puffs Twice daily  X 2 days  follow up Dr. Craige Cotta  In 2 weeks as planned and As needed   Please contact office for sooner follow up if symptoms do not improve or worsen or seek emergency care

## 2012-08-26 NOTE — Progress Notes (Signed)
Reviewed and agree with assessment/plan. 

## 2012-08-26 NOTE — Patient Instructions (Addendum)
Zpack take as directed.  Mucinex Twice daily  As needed  Congestion  Saline nasal rinses As needed   Afrin 2 puffs Twice daily  X 2 days  follow up Dr. Sood  In 2 weeks as planned and As needed   Please contact office for sooner follow up if symptoms do not improve or worsen or seek emergency care    

## 2012-09-09 ENCOUNTER — Encounter: Payer: Self-pay | Admitting: Pulmonary Disease

## 2012-09-09 ENCOUNTER — Ambulatory Visit (INDEPENDENT_AMBULATORY_CARE_PROVIDER_SITE_OTHER): Admitting: Pulmonary Disease

## 2012-09-09 VITALS — BP 112/76 | HR 109 | Temp 98.4°F | Ht 61.0 in | Wt 207.0 lb

## 2012-09-09 DIAGNOSIS — J019 Acute sinusitis, unspecified: Secondary | ICD-10-CM

## 2012-09-09 DIAGNOSIS — J45909 Unspecified asthma, uncomplicated: Secondary | ICD-10-CM

## 2012-09-09 DIAGNOSIS — J309 Allergic rhinitis, unspecified: Secondary | ICD-10-CM

## 2012-09-09 DIAGNOSIS — J4530 Mild persistent asthma, uncomplicated: Secondary | ICD-10-CM

## 2012-09-09 MED ORDER — LORATADINE 10 MG PO TABS: 10.0000 mg | ORAL_TABLET | Freq: Every day | ORAL | Status: DC

## 2012-09-09 NOTE — Assessment & Plan Note (Signed)
She is to continue claritin as needed.

## 2012-09-09 NOTE — Assessment & Plan Note (Signed)
She has been doing well.  Will allow her more time to recover from recent sinus infection and get through the allergy season.  If she is doing better, then advised her to call back to discuss possibly stepping down her inhaler regimen to just ICS w/o LABA.

## 2012-09-09 NOTE — Assessment & Plan Note (Signed)
Resolved

## 2012-09-09 NOTE — Progress Notes (Signed)
Chief Complaint  Patient presents with  . Follow-up    Pt states she has very little dry cough, right side pain x couple days. Denies any wheezing, chest tx, increase SOB., no nasal congestion, no drainage    History of Present Illness: Dominique Mooney is a 62 y.o. female with asthma and allergic rhinitis.  She was treated for a sinus infection earlier this month.  She is doing better.  Prior to this she was doing well.  She retired from work in March, and feels like getting out of her work environment helped with her breathing.  She has been getting allergies over the past week, but has been using claritin which helps.  She still gets occasional dry cough.  She denies wheeze or chest tightness.  She is not having sinus pressure, post nasal drip, or sore throat.  She is not having fever.  She has not needed to use albuterol much.  Tests: PFT 01/05/08>>FEV1 2.36 (106%), TLC 4.16 (92%), DLCO 89%, no BD response RAST 08/02/08>>negative, IgE 63.9 PFT 07/23/11>>FEV1 2.28(107%), FEV1% 79, TLC 4.61(102%), DLCO 82%, no BD.   She  has a past medical history of Hypothyroidism; Irritable bowel syndrome; GERD (gastroesophageal reflux disease); Pancreatitis; Hyperlipidemia; Allergic rhinitis; Asthma; and Chest pressure.   She  has past surgical history that includes Cholecystectomy; Hernia repair; Total abdominal hysterectomy; Salpingoophorectomy; BRAVO ph study (05/02/2011); and Esophagogastroduodenoscopy (05/02/2011).   Current Outpatient Prescriptions on File Prior to Visit  Medication Sig Dispense Refill  . albuterol (PROVENTIL HFA;VENTOLIN HFA) 108 (90 BASE) MCG/ACT inhaler Inhale 2 puffs into the lungs every 6 (six) hours as needed. For shortness of breath      . amLODipine (NORVASC) 5 MG tablet TAKE 1 TABLET BY MOUTH ONCE DAILY  90 tablet  3  . aspirin EC 81 MG tablet Take 162 mg by mouth once.      . budesonide-formoterol (SYMBICORT) 160-4.5 MCG/ACT inhaler Inhale 2 puffs into the lungs 2 (two)  times daily.      . fish oil-omega-3 fatty acids 1000 MG capsule Take 1 g by mouth daily.       Marland Kitchen levothyroxine (SYNTHROID, LEVOTHROID) 112 MCG tablet Take 112 mcg by mouth daily.      . Multiple Vitamin (MULITIVITAMIN WITH MINERALS) TABS Take 1 tablet by mouth daily.      . pantoprazole (PROTONIX) 40 MG tablet Take 1 tablet (40 mg total) by mouth daily.  90 tablet  3  . PROVENTIL HFA 108 (90 BASE) MCG/ACT inhaler INHALE 2 PUFFS INTO THE LUNGS EVERY 6 (SIX) HOURS AS NEEDED.  1 Inhaler  3   No current facility-administered medications on file prior to visit.    Allergies  Allergen Reactions  . Penicillins Rash    Physical Exam:  General - No distress HEENT - no sinus tenderness, no oral exudate Cardiac - s1s2 regular Chest - no wheeze/rales/tenderness Abdomen - soft, nontender Extremities - no edema Skin - no rashes Neurologic - normal strength Psychiatric - normal mood, behavior   Assessment/Plan:  Coralyn Helling, MD [facility] Pulmonary/Critical Care 09/09/2012, 4:06 PM Pager:  [phone] After 3pm call: [phone]

## 2012-09-09 NOTE — Patient Instructions (Signed)
Follow up in 1 year.

## 2012-09-29 ENCOUNTER — Ambulatory Visit
Admission: RE | Admit: 2012-09-29 | Discharge: 2012-09-29 | Disposition: A | Discharge status: Home / Self Care | Source: Ambulatory Visit | Attending: Obstetrics and Gynecology | Admitting: Obstetrics and Gynecology

## 2012-09-29 DIAGNOSIS — N649 Disorder of breast, unspecified: Secondary | ICD-10-CM

## 2012-10-19 ENCOUNTER — Ambulatory Visit (INDEPENDENT_AMBULATORY_CARE_PROVIDER_SITE_OTHER): Admitting: General Surgery

## 2012-10-19 ENCOUNTER — Encounter (INDEPENDENT_AMBULATORY_CARE_PROVIDER_SITE_OTHER): Payer: Self-pay | Admitting: General Surgery

## 2012-10-19 VITALS — BP 122/84 | HR 93 | Temp 98.6°F | Resp 18 | Ht 61.0 in | Wt 204.4 lb

## 2012-10-19 DIAGNOSIS — R928 Other abnormal and inconclusive findings on diagnostic imaging of breast: Secondary | ICD-10-CM

## 2012-10-19 DIAGNOSIS — N644 Mastodynia: Secondary | ICD-10-CM

## 2012-10-19 NOTE — Patient Instructions (Signed)
Follow up in 6 months after imaging.  Continue evening primrose oil to left breast.  Continue ibuprofen and heating pad as needed.  Finish course of antibiotics per OB.

## 2012-10-19 NOTE — Assessment & Plan Note (Signed)
Small cyst likely is also fibrocystic breast disease.   If again birads 3 on next imaging, will plan excision.  I advised her that I did not think removal would relieve her pain.  Follow up in 6 months post imaging.   She is to call if she has severe symptoms and needs to be seen earlier.

## 2012-10-19 NOTE — Progress Notes (Signed)
HISTORY: Patient is a 62 year old female that I have seen for left breast pain. She has also had an abnormal mammogram. There is a small 3-5 mm probable cyst at 3:00. She denies caffeine intake, but on further questioning she does drink green tea. She has been applying evening primrose well to her left breast which has helped some. However, 2 weeks ago she had an episode of severe pain. She saw her OB/GYN she thought that the breast appeared red and had a sense of fullness in that area. She started her on some antibiotics. The patient reports significant improvement since that time. She describes a sense of something being sucked out of a straw. She felt like she had a whitehead on her nipple but never had any drainage come out.   PERTINENT REVIEW OF SYSTEMS: Otherwise negative x 11.  No fevers/ chills.    Filed Vitals:   10/19/12 1012  BP: 122/84  Pulse: 93  Temp: 98.6 F (37 C)  Resp: 18   Filed Weights   10/19/12 1012  Weight: 204 lb 6.4 oz (92.715 kg)    EXAM: Head: Normocephalic and atraumatic.  Eyes:  Conjunctivae are normal. Pupils are equal, round, and reactive to light. No scleral icterus.  Neck:  Normal range of motion. Neck supple. No tracheal deviation present. No thyromegaly present.  Resp: No respiratory distress, normal effort. Breast:  Breasts are symmetric bilaterally.  Both breasts without masses.  Tenderness at 3 oclock extending from 1 cm from nipple to 5 cm from nipple. There is corded dense breast tissue in this area. No axillary LAD.   Abd:  Abdomen is soft, non distended and non tender. No masses are palpable.  There is no rebound and no guarding.  Neurological: Alert and oriented to person, place, and time. Coordination normal.  Skin: Skin is warm and dry. No rash noted. No diaphoretic. No erythema. No pallor.  Psychiatric: Normal mood and affect. Normal behavior. Judgment and thought content normal.      ASSESSMENT AND PLAN:   Mastodynia Most likely cause  of pain is still fibrocystic breast disease.    Continue evening primrose oil topically.  Continue ibuprofen/heating pad as needed.  If severe pain occurs again, may need to try short course of danazol. Pt is drinking some green tea.  Have advised her to switch to decaf green tea.   Will follow up in 6 months.    Abnormal mammogram Small cyst likely is also fibrocystic breast disease.   If again birads 3 on next imaging, will plan excision.  I advised her that I did not think removal would relieve her pain.  Follow up in 6 months post imaging.   She is to call if she has severe symptoms and needs to be seen earlier.        Maudry Diego, MD Surgical Oncology, General & Endocrine Surgery [facility]  Juline Patch, MD Juline Patch, MD

## 2012-10-19 NOTE — Assessment & Plan Note (Signed)
Most likely cause of pain is still fibrocystic breast disease.    Continue evening primrose oil topically.  Continue ibuprofen/heating pad as needed.  If severe pain occurs again, may need to try short course of danazol. Pt is drinking some green tea.  Have advised her to switch to decaf green tea.   Will follow up in 6 months.

## 2012-11-26 ENCOUNTER — Other Ambulatory Visit: Payer: Self-pay | Admitting: Internal Medicine

## 2012-12-01 ENCOUNTER — Ambulatory Visit (INDEPENDENT_AMBULATORY_CARE_PROVIDER_SITE_OTHER): Payer: Commercial Managed Care - PPO | Admitting: General Surgery

## 2013-02-01 ENCOUNTER — Emergency Department (HOSPITAL_COMMUNITY)

## 2013-02-01 ENCOUNTER — Encounter (HOSPITAL_COMMUNITY): Payer: Self-pay | Admitting: *Deleted

## 2013-02-01 ENCOUNTER — Emergency Department (HOSPITAL_COMMUNITY)
Admission: EM | Admit: 2013-02-01 | Discharge: 2013-02-01 | Disposition: A | Discharge status: Home / Self Care | Attending: Emergency Medicine | Admitting: Emergency Medicine

## 2013-02-01 DIAGNOSIS — R071 Chest pain on breathing: Secondary | ICD-10-CM | POA: Insufficient documentation

## 2013-02-01 DIAGNOSIS — Z79899 Other long term (current) drug therapy: Secondary | ICD-10-CM | POA: Insufficient documentation

## 2013-02-01 DIAGNOSIS — IMO0002 Reserved for concepts with insufficient information to code with codable children: Secondary | ICD-10-CM | POA: Insufficient documentation

## 2013-02-01 DIAGNOSIS — J45901 Unspecified asthma with (acute) exacerbation: Secondary | ICD-10-CM | POA: Insufficient documentation

## 2013-02-01 DIAGNOSIS — E039 Hypothyroidism, unspecified: Secondary | ICD-10-CM | POA: Insufficient documentation

## 2013-02-01 DIAGNOSIS — Z9889 Other specified postprocedural states: Secondary | ICD-10-CM | POA: Insufficient documentation

## 2013-02-01 DIAGNOSIS — R0789 Other chest pain: Secondary | ICD-10-CM

## 2013-02-01 DIAGNOSIS — T819XXA Unspecified complication of procedure, initial encounter: Secondary | ICD-10-CM | POA: Insufficient documentation

## 2013-02-01 DIAGNOSIS — R0602 Shortness of breath: Secondary | ICD-10-CM | POA: Insufficient documentation

## 2013-02-01 DIAGNOSIS — Y849 Medical procedure, unspecified as the cause of abnormal reaction of the patient, or of later complication, without mention of misadventure at the time of the procedure: Secondary | ICD-10-CM | POA: Insufficient documentation

## 2013-02-01 DIAGNOSIS — Z880 Allergy status to penicillin: Secondary | ICD-10-CM | POA: Insufficient documentation

## 2013-02-01 DIAGNOSIS — Z8719 Personal history of other diseases of the digestive system: Secondary | ICD-10-CM | POA: Insufficient documentation

## 2013-02-01 HISTORY — DX: Essential (primary) hypertension: I10

## 2013-02-01 LAB — CBC
Platelets: 354 10*3/uL (ref 150–400)
RDW: 12.7 % (ref 11.5–15.5)
WBC: 12 10*3/uL — ABNORMAL HIGH (ref 4.0–10.5)

## 2013-02-01 LAB — POCT I-STAT TROPONIN I: Troponin i, poc: 0 ng/mL (ref 0.00–0.08)

## 2013-02-01 LAB — BASIC METABOLIC PANEL
Chloride: 101 mEq/L (ref 96–112)
Creatinine, Ser: 0.98 mg/dL (ref 0.50–1.10)
GFR calc Af Amer: 71 mL/min — ABNORMAL LOW (ref >90)
Potassium: 3.9 mEq/L (ref 3.5–5.1)

## 2013-02-01 MED ORDER — HYDROMORPHONE HCL PF 1 MG/ML IJ SOLN
0.5000 mg | Freq: Once | INTRAMUSCULAR | Status: AC
  Administered 2013-02-01: 0.5 mg via INTRAVENOUS
  Filled 2013-02-01: qty 1

## 2013-02-01 MED ORDER — CYCLOBENZAPRINE HCL 10 MG PO TABS: 10.0000 mg | ORAL_TABLET | Freq: Two times a day (BID) | ORAL | Status: DC | PRN

## 2013-02-01 MED ORDER — IOHEXOL 350 MG/ML SOLN
100.0000 mL | Freq: Once | INTRAVENOUS | Status: AC | PRN
  Administered 2013-02-01: 100 mL via INTRAVENOUS

## 2013-02-01 MED ORDER — OXYCODONE-ACETAMINOPHEN 5-325 MG PO TABS: ORAL_TABLET | ORAL | Status: DC

## 2013-02-01 NOTE — ED Notes (Signed)
Patient transported to CT 

## 2013-02-01 NOTE — ED Notes (Signed)
Bed: ZO10 Expected date:  Expected time:  Means of arrival:  Comments: [place]

## 2013-02-01 NOTE — ED Provider Notes (Signed)
CSN: [id number]     Arrival date & time 02/01/13  0915 History   First MD Initiated Contact with Patient 02/01/13 [id number]     Chief Complaint  Patient presents with  . Chest Pain  . Shortness of Breath  . Post-op Problem   (Consider location/radiation/quality/duration/timing/severity/associated sxs/prior Treatment) HPI  Dominique Mooney is a 62 y.o. female past medical history significant for GERD and asthma, who is status post meniscal repair to right knee last Tuesday by Dr. Thomasena Edis complaining of right-sided chest pain radiating around to the right back. Onset last night at 7 PM, rated at 10 out of 10, described as pressure, associated with shortness of breath and diaphoresis. Pain has been constant since last night at 7 PM. Pain is not alleviated by 5 mg Percocet. Patient denies history of DVT or PE, calf or leg swelling, fever, cough, nausea vomiting, change in bowel or bladder habits, abdominal pain.  Past Medical History  Diagnosis Date  . Hypothyroidism   . Irritable bowel syndrome   . GERD (gastroesophageal reflux disease)   . Pancreatitis     after ERCP  . Hyperlipidemia   . Allergic rhinitis        . Asthma        . Chest pressure     a. Normal Stress Echo 05/2010   Past Surgical History  Procedure Laterality Date  . Cholecystectomy    . Hernia repair    . Total abdominal hysterectomy    . Salpingoophorectomy    . Bravo ph study  05/02/2011    Procedure: BRAVO PH STUDY;  Surgeon: Rob Bunting, MD;  Location: [facility];  Service: Endoscopy;  Laterality: N/A;  48 hour wirless pH test (Bravo Test)  . Esophagogastroduodenoscopy  05/02/2011    Procedure: ESOPHAGOGASTRODUODENOSCOPY (EGD);  Surgeon: Rob Bunting, MD;  Location: [facility];  Service: Endoscopy;  Laterality: N/A;   Family History  Problem Relation Age of Onset  . Allergies Brother   . Allergies Sister   . Heart attack Brother   . Heart attack Brother   . Stroke Mother   . Stroke Father   . Allergies  Son   . Colon cancer Neg Hx   . Stomach cancer Neg Hx   . Esophageal cancer Neg Hx    History  Substance Use Topics  . Smoking status: Never Smoker   . Smokeless tobacco: Never Used  . Alcohol Use: No   OB History   Grav Para Term Preterm Abortions TAB SAB Ect Mult Living                 Review of Systems 10 systems reviewed and found to be negative, except as noted in the HPI  Allergies  Penicillins  Home Medications   Current Outpatient Rx  Name  Route  Sig  Dispense  Refill  . aspirin EC 81 MG tablet   Oral   Take 162 mg by mouth once.         . budesonide-formoterol (SYMBICORT) 160-4.5 MCG/ACT inhaler   Inhalation   Inhale 2 puffs into the lungs 2 (two) times daily.         . fish oil-omega-3 fatty acids 1000 MG capsule   Oral   Take 1 g by mouth daily.          Marland Kitchen levothyroxine (SYNTHROID, LEVOTHROID) 112 MCG tablet   Oral   Take 112 mcg by mouth daily.         Marland Kitchen loratadine (CLARITIN)  10 MG tablet   Oral   Take 1 tablet (10 mg total) by mouth daily.   30 tablet   11   . Multiple Vitamin (MULITIVITAMIN WITH MINERALS) TABS   Oral   Take 1 tablet by mouth daily.         Marland Kitchen PROVENTIL HFA 108 (90 BASE) MCG/ACT inhaler      INHALE 2 PUFFS INTO THE LUNGS EVERY 6 (SIX) HOURS AS NEEDED.   1 Inhaler   3    BP 157/87  Pulse 94  Temp(Src) 98.3 F (36.8 C) (Oral)  Resp 15  SpO2 99% Physical Exam  Nursing note and vitals reviewed. Constitutional: She is oriented to person, place, and time. She appears well-developed and well-nourished. No distress.  HENT:  Head: Normocephalic.  Mouth/Throat: Oropharynx is clear and moist.  Eyes: Conjunctivae and EOM are normal. Pupils are equal, round, and reactive to light.  Neck: Normal range of motion.  Cardiovascular: Normal rate, regular rhythm and intact distal pulses.   Pulmonary/Chest: Effort normal and breath sounds normal. No stridor. No respiratory distress. She has no wheezes. She has no rales.    She exhibits tenderness.    Abdominal: Soft. She exhibits no distension and no mass. There is no tenderness. There is no rebound and no guarding.  Musculoskeletal: Normal range of motion. She exhibits no edema.  No calf asymmetry, superficial collaterals, palpable cords, edema, Homans sign negative bilaterally.    Neurological: She is alert and oriented to person, place, and time.  Psychiatric: She has a normal mood and affect.    ED Course  Procedures (including critical care time) Labs Review Labs Reviewed  CBC - Abnormal; Notable for the following:    WBC 12.0 (*)    All other components within normal limits  BASIC METABOLIC PANEL - Abnormal; Notable for the following:    Glucose, Bld 105 (*)    GFR calc non Af Amer 61 (*)    GFR calc Af Amer 71 (*)    All other components within normal limits  POCT I-STAT TROPONIN I   Imaging Review Dg Chest 2 View  02/01/2013   CLINICAL DATA:  postop knee arthroscopic knee comment chest pain  EXAM: CHEST  2 VIEW  COMPARISON:  08/24/2011  FINDINGS: The heart size and mediastinal contours are within normal limits. Both lungs are clear. The visualized skeletal structures are unremarkable.  IMPRESSION: No active cardiopulmonary disease.   Electronically Signed   By: Genevive Bi M.D.   On: 02/01/2013 10:14   Ct Angio Chest Pe W/cm &/or Wo Cm  02/01/2013   *RADIOLOGY REPORT*  Clinical Data: Chest pain, shortness of breath.  Recent knee surgery.  CT ANGIOGRAPHY CHEST  Technique:  Multidetector CT imaging of the chest using the standard protocol during bolus administration of intravenous contrast. Multiplanar reconstructed images including MIPs were obtained and reviewed to evaluate the vascular anatomy.  Contrast: OMNIPAQUE IOHEXOL 350 MG/ML SOLN  Comparison: None available  Findings: There is good contrast opacification of the pulmonary artery branches.  No discrete filling defect to suggest acute PE.Adequate contrast opacification of the  thoracic aorta with no evidence of dissection, aneurysm, or stenosis. There is classic 3- vessel brachiocephalic arch anatomy.  No pleural or pericardial effusion.  No hilar or mediastinal adenopathy.  Dependent atelectasis in both lower lobes.  No confluent consolidation or evident nodule or mass.  Minimal spurring in the thoracic spine. Surgical clips in the gallbladder fossa.  Remainder visualized upper abdomen  unremarkable.  IMPRESSION:  Negative for acute PE or thoracic aortic dissection.   Original Report Authenticated By: D. Andria Rhein, MD    Date: 02/01/2013  Rate: 85  Rhythm: normal sinus rhythm  QRS Axis: normal  Intervals: normal  ST/T Wave abnormalities: non-specific T wave changes  Conduction Disutrbances:none  Narrative Interpretation:   Old EKG Reviewed: unchanged   MDM   1. Chest wall pain     Filed Vitals:   02/01/13 0926 02/01/13 1252  BP: 157/87 144/114  Pulse: 94 77  Temp: 98.3 F (36.8 C)   TempSrc: Oral   Resp: 15 19  SpO2: 99% 99%     Dominique Mooney is a 62 y.o. female with chest pain and shortness of breath status post right knee surgery 1 week ago. Lung sounds are clear and patient is afebrile. EKG nonischemic, troponin negative and chest x-ray unremarkable. CT angio shows no knee or thoracic aortic dissection. I've advised patient to increase her Percocet to 1-2 tabs every 4-6 hours as needed and I will also start her on Flexeril.   Medications  HYDROmorphone (DILAUDID) injection 0.5 mg (0.5 mg Intravenous Given 02/01/13 1003)  iohexol (OMNIPAQUE) 350 MG/ML injection 100 mL (100 mLs Intravenous Contrast Given 02/01/13 1143)    Pt is hemodynamically stable, appropriate for, and amenable to discharge at this time. Pt verbalized understanding and agrees with care plan. All questions answered. Outpatient follow-up and specific return precautions discussed.    Discharge Medication List as of 02/01/2013 12:32 PM    START taking these medications   Details    cyclobenzaprine (FLEXERIL) 10 MG tablet Take 1 tablet (10 mg total) by mouth 2 (two) times daily as needed for muscle spasms., Starting 02/01/2013, Until Discontinued, Print    oxyCODONE-acetaminophen (PERCOCET/ROXICET) 5-325 MG per tablet 1 to 2 tabs PO q4-6hrs  PRN for pain, Print        Note: Portions of this report may have been transcribed using voice recognition software. Every effort was made to ensure accuracy; however, inadvertent computerized transcription errors may be present     Wynetta Emery, PA-C 02/01/13 1554

## 2013-02-01 NOTE — ED Notes (Signed)
Pt states had R knee surgery Tuesday, last night started having mid sternal chest pain radiating to back, states it's pressure, also states she feels short of breath, called her doctor and they were concerned about blood clot, denies n/v/d, denies dizziness/weakness, denies numbness/tingling.

## 2013-02-03 NOTE — ED Provider Notes (Signed)
Medical screening examination/treatment/procedure(s) were performed by non-physician practitioner and as supervising physician I was immediately available for consultation/collaboration.   Laray Anger, DO 02/03/13 2309

## 2013-03-02 ENCOUNTER — Other Ambulatory Visit: Payer: Self-pay | Admitting: Obstetrics and Gynecology

## 2013-03-02 DIAGNOSIS — N63 Unspecified lump in unspecified breast: Secondary | ICD-10-CM

## 2013-03-16 ENCOUNTER — Other Ambulatory Visit

## 2013-03-16 ENCOUNTER — Encounter

## 2013-03-25 ENCOUNTER — Other Ambulatory Visit: Payer: Self-pay

## 2013-03-31 ENCOUNTER — Ambulatory Visit
Admission: RE | Admit: 2013-03-31 | Discharge: 2013-03-31 | Disposition: A | Discharge status: Home / Self Care | Source: Ambulatory Visit | Attending: Obstetrics and Gynecology | Admitting: Obstetrics and Gynecology

## 2013-03-31 DIAGNOSIS — N63 Unspecified lump in unspecified breast: Secondary | ICD-10-CM

## 2013-04-09 ENCOUNTER — Ambulatory Visit (INDEPENDENT_AMBULATORY_CARE_PROVIDER_SITE_OTHER): Admitting: General Surgery

## 2013-04-12 ENCOUNTER — Ambulatory Visit (INDEPENDENT_AMBULATORY_CARE_PROVIDER_SITE_OTHER): Admitting: General Surgery

## 2013-04-26 ENCOUNTER — Ambulatory Visit (INDEPENDENT_AMBULATORY_CARE_PROVIDER_SITE_OTHER): Admitting: General Surgery

## 2013-04-26 ENCOUNTER — Encounter (INDEPENDENT_AMBULATORY_CARE_PROVIDER_SITE_OTHER): Payer: Self-pay | Admitting: General Surgery

## 2013-04-26 VITALS — BP 120/84 | HR 76 | Temp 97.3°F | Resp 14 | Ht 61.0 in | Wt 207.8 lb

## 2013-04-26 DIAGNOSIS — N644 Mastodynia: Secondary | ICD-10-CM

## 2013-04-26 DIAGNOSIS — R928 Other abnormal and inconclusive findings on diagnostic imaging of breast: Secondary | ICD-10-CM

## 2013-04-26 NOTE — Assessment & Plan Note (Signed)
Add ibuprofen 400 mg TID.  Follow up as needed.

## 2013-04-26 NOTE — Progress Notes (Signed)
HISTORY: Pt is a 62 yo F who is here for follow up for breast pain and small cyst in left breast.  At her last imaging, this small complex cyst was rated a BIRADS 3.  It was felt to be a small complicated cyst.  She underwent repeat imaging last month, and this was unchanged ,and was downgraded.  She was doing well with her breast pain after switching from green tea to decaf green tea.  She also stopped eating chocolate.  She started having pain again last week, but she is not sure what changed.  She denies changes in her activity or diet.  She states that the pains are usually sporadic, but some days, the left breast will hurt all day.     PERTINENT REVIEW OF SYSTEMS: Otherwise negative x 11.   Filed Vitals:   04/26/13 1401  BP: 120/84  Pulse: 76  Temp: 97.3 F (36.3 C)  Resp: 14   Filed Weights   04/26/13 1401  Weight: 207 lb 12.8 oz (94.257 kg)     EXAM: Head: Normocephalic and atraumatic.  Eyes:  Conjunctivae are normal. Pupils are equal, round, and reactive to light. No scleral icterus.  Neck:  Normal range of motion. Neck supple. No tracheal deviation present. No thyromegaly present.  Resp: No respiratory distress, normal effort. Breast:  Breasts are heterogeneously dense.  Denser areas are tender throughout both breasts.  No palpable masses or lymphadenopathy.  No nipple discharge.   Abd:  Abdomen is soft, non distended and non tender. No masses are palpable.  There is no rebound and no guarding.  Neurological: Alert and oriented to person, place, and time. Coordination normal.  Skin: Skin is warm and dry. No rash noted. No diaphoretic. No erythema. No pallor.  Psychiatric: Normal mood and affect. Normal behavior. Judgment and thought content normal.      ASSESSMENT AND PLAN:   Mastodynia This likely is diffuse fibrocystic change.   Add ibuprofen 400 mg TID.  Follow up as needed.    Abnormal mammogram Downgraded to birads-2.    Would not recommend removal since  this is a tiny cyst, and her breast tenderness is diffuse.   I think it is unlikely to help with her discomfort.          Maudry Diego, MD Surgical Oncology, General & Endocrine Surgery [facility]  Juline Patch, MD Juline Patch, MD

## 2013-04-26 NOTE — Patient Instructions (Signed)
Add ibuprofen 400 mg 3 times per day with food.    Follow up PRN.

## 2013-04-26 NOTE — Assessment & Plan Note (Signed)
Downgraded to birads-2.    Would not recommend removal since this is a tiny cyst, and her breast tenderness is diffuse.   I think it is unlikely to help with her discomfort.

## 2013-06-29 ENCOUNTER — Ambulatory Visit (INDEPENDENT_AMBULATORY_CARE_PROVIDER_SITE_OTHER): Admitting: Gastroenterology

## 2013-06-29 ENCOUNTER — Other Ambulatory Visit (INDEPENDENT_AMBULATORY_CARE_PROVIDER_SITE_OTHER)

## 2013-06-29 ENCOUNTER — Encounter: Payer: Self-pay | Admitting: Gastroenterology

## 2013-06-29 VITALS — BP 102/78 | HR 92 | Ht 62.0 in | Wt 204.0 lb

## 2013-06-29 DIAGNOSIS — R194 Change in bowel habit: Secondary | ICD-10-CM

## 2013-06-29 DIAGNOSIS — K5900 Constipation, unspecified: Secondary | ICD-10-CM

## 2013-06-29 DIAGNOSIS — R198 Other specified symptoms and signs involving the digestive system and abdomen: Secondary | ICD-10-CM

## 2013-06-29 LAB — TSH: TSH: 6.26 u[IU]/mL — ABNORMAL HIGH (ref 0.35–5.50)

## 2013-06-29 LAB — LIPASE: Lipase: 41 U/L (ref 11.0–59.0)

## 2013-06-29 LAB — AMYLASE: AMYLASE: 71 U/L (ref 27–131)

## 2013-06-29 MED ORDER — MOVIPREP 100 G PO SOLR: 1.0000 | Freq: Once | ORAL | Status: DC

## 2013-06-29 NOTE — Patient Instructions (Signed)
Continue flax seed daily. Start one dose of miralax every day. You will have labs checked today in the basement lab.  Please head down after you check out with the front desk  (tsh, amylase, lipase). You will be set up for a colonoscopy for change in your bowels, constipation ([facility]).

## 2013-06-29 NOTE — Progress Notes (Signed)
Review of gastrointestinal problems:  1. Dysphasia, nausea 2009. EGD may 2009 was normal. Symptoms thought to be GERD related. Symptoms improved with addition of H2 blocker (March 2011). Barium esophagram 2011 was normal.  2. routine risk for colon cancer, colonoscopy may 2009: hemorrhoids only. Next colonoscopy may 2019; Intermittent IBS-like discomforts that are helped with antispasmodics usually.  3. post ERCP pancreatitis, 2000  4. laparoscopic cholecystectomy, 2000  5. GERD: EGD 12/12 with [employer] pH test 48 hours, done 05/02/11 while still on BID PPI (omeprazole) and bedtime H2 blocker was + for continued pathologic acid exposure. 2/13 felt much better on bid nexium than other PPI and nightly H2 blocker.   HPI: This is a very pleasant 63 yo woman  Starting this past November, constipation and also intermittent regurge.   Had been having BMs daily, then no BM for every 4 days.  She even increased her fiber, salad intake.  She had been taking fiber supplement.  Still takes nexium bid and pepcid at night.  Overall weight has been stable.  No blood in her stool.  Lots of cranberry.  Had knee surgery in sept, not excercising as much since then.  No caffeine in a long time.  Has been on advil since knee surgery.   Past Medical History  Diagnosis Date  . Hypothyroidism   . Irritable bowel syndrome   . GERD (gastroesophageal reflux disease)   . Pancreatitis     after ERCP  . Hyperlipidemia   . Allergic rhinitis        . Asthma        . Chest pressure     a. Normal Stress Echo 05/2010  . Hypertension     Past Surgical History  Procedure Laterality Date  . Cholecystectomy    . Hernia repair    . Total abdominal hysterectomy    . Salpingoophorectomy    . Bravo ph study  05/02/2011    Procedure: BRAVO [place] STUDY;  Surgeon: Owens Loffler, MD;  Location: [facility];  Service: Endoscopy;  Laterality: N/A;  48 hour wirless pH test (Bravo Test)  .  Esophagogastroduodenoscopy  05/02/2011    Procedure: ESOPHAGOGASTRODUODENOSCOPY (EGD);  Surgeon: Owens Loffler, MD;  Location: [facility];  Service: Endoscopy;  Laterality: N/A;    Current Outpatient Prescriptions  Medication Sig Dispense Refill  . amLODipine (NORVASC) 5 MG tablet Take 5 mg by mouth daily. Hasn't taken since surgery.      Marland Kitchen aspirin 325 MG tablet Take 325 mg by mouth daily. Two week course ends 9/32/14.      . cephALEXin (KEFLEX) 500 MG capsule Take 500 mg by mouth 3 (three) times daily. 4 day course for post-op infection prophylaxis began 01/26/13 but she still has one capsule left.      . fish oil-omega-3 fatty acids 1000 MG capsule Take 1 g by mouth daily. Held since 1 week prior to surgery.      Marland Kitchen levothyroxine (SYNTHROID, LEVOTHROID) 100 MCG tablet Take 100 mcg by mouth daily before breakfast.      . Multiple Vitamin (MULITIVITAMIN WITH MINERALS) TABS Take 1 tablet by mouth daily. Held since 1 week prior to surgery.      Marland Kitchen PROVENTIL HFA 108 (90 BASE) MCG/ACT inhaler INHALE 2 PUFFS INTO THE LUNGS EVERY 6 (SIX) HOURS AS NEEDED.  1 Inhaler  3   No current facility-administered medications for this visit.    Allergies as of 06/29/2013 - Review Complete 06/29/2013  Allergen Reaction Noted  .  Penicillins Rash     Family History  Problem Relation Age of Onset  . Allergies Brother   . Allergies Sister   . Heart attack Brother   . Heart attack Brother   . Stroke Mother   . Stroke Father   . Allergies Son   . Colon cancer Neg Hx   . Stomach cancer Neg Hx   . Esophageal cancer Neg Hx     History   Social History  . Marital Status: Married    Spouse Name: N/A    Number of Children: 2  . 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 Smoking status: Never Smoker   . Smokeless tobacco: Never Used  . Alcohol Use: No  . Drug Use: No  . Sexual Activity: Not on file   Other Topics Concern  . 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   Currently retired as of 07/2012      Physical Exam: BP 102/78  Pulse 92  Ht 5\' 2"  (1.575 m)  Wt 204 lb (92.534 kg)  BMI 37.30 kg/m2 Constitutional: generally well-appearing Psychiatric: alert and oriented x3 Abdomen: soft, nontender, nondistended, no obvious ascites, no peritoneal signs, normal bowel sounds     Assessment and plan: 63 y.o. female with with significant change in her bowels over the past several months, abdominal discomfort, intermittent regurgitation  She has both upper and lower GI symptoms however I am most struck by the significant change in her bowel habits over the past few months. Perhaps it is from some change in her exercise pattern since her knee surgery in September 2014. I'm going to have thyroid testing checked. Her last colonoscopy was about 6 years ago and I think that should be repeated now to rule out structural causes. In the meantime she will begin MiraLax one dose daily in addition to her fiber supplements. She is also very concerned about possible pancreatitis and I think it is very unlikely. She does have intermittent right upper quadrant pains and asked that I check her pancreas numbers which I will do

## 2013-07-20 ENCOUNTER — Ambulatory Visit (AMBULATORY_SURGERY_CENTER): Admitting: Gastroenterology

## 2013-07-20 ENCOUNTER — Encounter: Payer: Self-pay | Admitting: Gastroenterology

## 2013-07-20 VITALS — BP 130/83 | HR 73 | Temp 98.1°F | Resp 13 | Ht 62.0 in | Wt 204.0 lb

## 2013-07-20 DIAGNOSIS — R194 Change in bowel habit: Secondary | ICD-10-CM

## 2013-07-20 DIAGNOSIS — R198 Other specified symptoms and signs involving the digestive system and abdomen: Secondary | ICD-10-CM

## 2013-07-20 DIAGNOSIS — K5900 Constipation, unspecified: Secondary | ICD-10-CM

## 2013-07-20 MED ORDER — SODIUM CHLORIDE 0.9 % IV SOLN: 500.0000 mL | INTRAVENOUS | Status: DC

## 2013-07-20 NOTE — Patient Instructions (Signed)
YOU HAD AN ENDOSCOPIC PROCEDURE TODAY AT THE [facility]: Refer to the procedure report that was given to you for any specific questions about what was found during the examination.  If the procedure report does not answer your questions, please call your gastroenterologist to clarify.  If you requested that your care partner not be given the details of your procedure findings, then the procedure report has been included in a sealed envelope for you to review at your convenience later.  YOU SHOULD EXPECT: Some feelings of bloating in the abdomen. Passage of more gas than usual.  Walking can help get rid of the air that was put into your GI tract during the procedure and reduce the bloating. If you had a lower endoscopy (such as a colonoscopy or flexible sigmoidoscopy) you may notice spotting of blood in your stool or on the toilet paper. If you underwent a bowel prep for your procedure, then you may not have a normal bowel movement for a few days.  DIET: Your first meal following the procedure should be a light meal and then it is ok to progress to your normal diet.  A half-sandwich or bowl of soup is an example of a good first meal.  Heavy or fried foods are harder to digest and may make you feel nauseous or bloated.  Likewise meals heavy in dairy and vegetables can cause extra gas to form and this can also increase the bloating.  Drink plenty of fluids but you should avoid alcoholic beverages for 24 hours.  ACTIVITY: Your care partner should take you home directly after the procedure.  You should plan to take it easy, moving slowly for the rest of the day.  You can resume normal activity the day after the procedure however you should NOT DRIVE or use heavy machinery for 24 hours (because of the sedation medicines used during the test).    SYMPTOMS TO REPORT IMMEDIATELY: A gastroenterologist can be reached at any hour.  During normal business hours, 8:30 AM to 5:00 PM Monday through Friday,  call [phone].  After hours and on weekends, please call the GI answering service at [phone] who will take a message and have the physician on call contact you.   Following lower endoscopy (colonoscopy or flexible sigmoidoscopy):  Excessive amounts of blood in the stool  Significant tenderness or worsening of abdominal pains  Swelling of the abdomen that is new, acute  Fever of 100F or higher  FOLLOW UP: If any biopsies were taken you will be contacted by phone or by letter within the next 1-3 weeks.  Call your gastroenterologist if you have not heard about the biopsies in 3 weeks.  Our staff will call the home number listed on your records the next business day following your procedure to check on you and address any questions or concerns that you may have at that time regarding the information given to you following your procedure. This is a courtesy call and so if there is no answer at the home number and we have not heard from you through the emergency physician on call, we will assume that you have returned to your regular daily activities without incident.  SIGNATURES/CONFIDENTIALITY: You and/or your care partner have signed paperwork which will be entered into your electronic medical record.  These signatures attest to the fact that that the information above on your After Visit Summary has been reviewed and is understood.  Full responsibility of the confidentiality of this   discharge information lies with you and/or your care-partner.  Resume medications. 

## 2013-07-20 NOTE — Progress Notes (Signed)
Lidocaine-40mg IV prior to Propofol InductionPropofol given over incremental dosages 

## 2013-07-20 NOTE — Op Note (Signed)
[facility]  [address] [place], [address]   COLONOSCOPY PROCEDURE REPORT  PATIENT: Dominique Mooney, Dominique Mooney  MR#: [id number] BIRTHDATE: 09-13-1950 , 62  yrs. old GENDER: Female ENDOSCOPIST: Milus Banister, MD PROCEDURE DATE:  07/20/2013 PROCEDURE:   Colonoscopy, diagnostic First Screening Colonoscopy - Avg.  risk and is 50 yrs.  old or older - No.  Prior Negative Screening - Now for repeat screening. Other: See Comments  History of Adenoma - Now for follow-up colonoscopy & has been > or = to 3 yrs.  N/A  Polyps Removed Today? No.  Recommend repeat exam, <10 yrs? No. ASA CLASS:   Class II INDICATIONS:colonoscopy 2009, recent change in bowel habits, constipation. MEDICATIONS: propofol (Diprivan) 250mg  IV and MAC sedation, administered by CRNA  DESCRIPTION OF PROCEDURE:   After the risks benefits and alternatives of the procedure were thoroughly explained, informed consent was obtained.  A digital rectal exam revealed no abnormalities of the rectum.   The LB [id number]-[id number]  endoscope was introduced through the anus and advanced to the cecum, which was identified by both the appendix and ileocecal valve. No adverse events experienced.   The quality of the prep was good.  The instrument was then slowly withdrawn as the colon was fully examined.    COLON FINDINGS: A normal appearing cecum, ileocecal valve, and appendiceal orifice were identified.  The ascending, hepatic flexure, transverse, splenic flexure, descending, sigmoid colon and rectum appeared unremarkable.  No polyps or cancers were seen. Retroflexed views revealed no abnormalities. The time to cecum=6 minutes 45 seconds.  Withdrawal time=6 minutes 00 seconds.  The scope was withdrawn and the procedure completed. COMPLICATIONS: There were no complications.  ENDOSCOPIC IMPRESSION: Normal colon No polyps or cancers  RECOMMENDATIONS: You should continue to follow colorectal cancer screening  guidelines for "routine risk" patients with a repeat colonoscopy in 10 years.  Please restart once daily miralax (it seems that it was helping your recent constipation)   eSigned:  Milus Banister, MD 07/20/2013 2:36 PM   cc: Tommy Medal, MD

## 2013-07-21 ENCOUNTER — Telehealth: Payer: Self-pay | Admitting: *Deleted

## 2013-07-21 NOTE — Telephone Encounter (Signed)
  Follow up Call-  Call back number 07/20/2013 04/26/2011  Post procedure Call Back phone  # [phone] work # directly to pt  Permission to leave phone message Yes -     Patient questions:  Do you have a fever, pain , or abdominal swelling? no Pain Score  0 *  Have you tolerated food without any problems? yes  Have you been able to return to your normal activities? yes  Do you have any questions about your discharge instructions: Diet   no Medications  no Follow up visit  no  Do you have questions or concerns about your Care? no  Actions: * If pain score is 4 or above: No action needed, pain <4.

## 2013-08-08 ENCOUNTER — Other Ambulatory Visit (HOSPITAL_COMMUNITY): Payer: Self-pay | Admitting: Cardiovascular Disease

## 2013-09-09 ENCOUNTER — Ambulatory Visit: Admitting: Adult Health

## 2013-09-14 ENCOUNTER — Ambulatory Visit: Admitting: Pulmonary Disease

## 2013-09-20 ENCOUNTER — Ambulatory Visit (INDEPENDENT_AMBULATORY_CARE_PROVIDER_SITE_OTHER): Admitting: Pulmonary Disease

## 2013-09-20 ENCOUNTER — Encounter: Payer: Self-pay | Admitting: Pulmonary Disease

## 2013-09-20 VITALS — BP 122/78 | HR 101 | Ht 61.0 in | Wt 210.8 lb

## 2013-09-20 DIAGNOSIS — J309 Allergic rhinitis, unspecified: Secondary | ICD-10-CM

## 2013-09-20 DIAGNOSIS — J45909 Unspecified asthma, uncomplicated: Secondary | ICD-10-CM

## 2013-09-20 DIAGNOSIS — J4530 Mild persistent asthma, uncomplicated: Secondary | ICD-10-CM

## 2013-09-20 MED ORDER — FLUTICASONE PROPIONATE 50 MCG/ACT NA SUSP: 2.0000 | Freq: Every day | NASAL | Status: AC

## 2013-09-20 NOTE — Assessment & Plan Note (Signed)
Current symptoms likely related to allergies.  Continue OTC anti-histamine.  Will have her use nasal irrigation and flonase.

## 2013-09-20 NOTE — Progress Notes (Signed)
Chief Complaint  Patient presents with  . Follow-up    1 yr f/u - Pain in right ear and right cheek area for past week - Sinus pressure - Denies cough, wheeze or sob    History of Present Illness: Dominique Mooney is a 63 y.o. female with asthma and allergic rhinitis.  She has noticed congestion in her sinuses and right ear pain over the past one week.  This is getting better, but still present. She denies fever, cough, or sputum.  She had chest tightness last week >> this got better after using albuterol.    She uses symbicort bid.  She is using nasal irrigation.  She is not using flonase >> used this before, and helped.  Tests: PFT 01/05/08>>FEV1 2.36 (106%), TLC 4.16 (92%), DLCO 89%, no BD response RAST 08/02/08>>negative, IgE 63.9 PFT 07/23/11>>FEV1 2.28(107%), FEV1% 79, TLC 4.61(102%), DLCO 82%, no BD. CT chest 02/01/13 >> normal   She  has a past medical history of Hypothyroidism; Irritable bowel syndrome; GERD (gastroesophageal reflux disease); Pancreatitis; Hyperlipidemia; Allergic rhinitis; Asthma; Chest pressure; and Hypertension.   She  has past surgical history that includes Cholecystectomy; Hernia repair; Total abdominal hysterectomy; Salpingoophorectomy; BRAVO ph study (05/02/2011); and Esophagogastroduodenoscopy (05/02/2011).   Current Outpatient Prescriptions on File Prior to Visit  Medication Sig Dispense Refill  . amLODipine (NORVASC) 5 MG tablet TAKE 1 TABLET BY MOUTH EVERY DAY  90 tablet  0  . aspirin 325 MG tablet Take 325 mg by mouth daily. Two week course ends 9/32/14.      . fish oil-omega-3 fatty acids 1000 MG capsule Take 1 g by mouth daily. Held since 1 week prior to surgery.      . gabapentin (NEURONTIN) 300 MG capsule Take 300 mg by mouth daily.      Marland Kitchen levothyroxine (SYNTHROID, LEVOTHROID) 100 MCG tablet Take 100 mcg by mouth daily before breakfast.      . Multiple Vitamin (MULITIVITAMIN WITH MINERALS) TABS Take 1 tablet by mouth daily. Held since 1 week prior  to surgery.      . NON FORMULARY Apply 1-2 g/day topically 3 (three) times daily as needed (cream for neuropathy pain).      Marland Kitchen PROVENTIL HFA 108 (90 BASE) MCG/ACT inhaler INHALE 2 PUFFS INTO THE LUNGS EVERY 6 (SIX) HOURS AS NEEDED.  1 Inhaler  3   No current facility-administered medications on file prior to visit.    Allergies  Allergen Reactions  . Penicillins Rash    Physical Exam:  General - No distress HEENT - no sinus tenderness, no oral exudate, mild cerumen build up, TM clear, mild enlarged Rt posterior auricular lymph node Cardiac - s1s2 regular Chest - no wheeze/rales/tenderness Abdomen - soft, nontender Extremities - no edema Skin - no rashes Neurologic - normal strength Psychiatric - normal mood, behavior   Assessment/Plan:  Chesley Mires, MD [facility] 09/20/2013, 1:45 PM Pager:  [phone] After 3pm call: [phone]

## 2013-09-20 NOTE — Patient Instructions (Signed)
flonase 2 sprays each nostril daily for 2 weeks, then as needed for allergies and sinus congestion Follow up in 6 months

## 2013-09-20 NOTE — Assessment & Plan Note (Signed)
Continue sybmicort and prn albuterol.

## 2013-10-05 ENCOUNTER — Telehealth (INDEPENDENT_AMBULATORY_CARE_PROVIDER_SITE_OTHER): Payer: Self-pay

## 2013-10-05 NOTE — Telephone Encounter (Signed)
Patient states she has right axillary pain denies redness, warmth,temp. Spoke with DR. Byerly she advised for patient to be  scheduled next new patient slot . Appointment 10-22-13 11:30 advised patient to call if area becomes worse or further questions . Patient verbalized understanding

## 2013-10-19 ENCOUNTER — Ambulatory Visit
Admission: RE | Admit: 2013-10-19 | Discharge: 2013-10-19 | Disposition: A | Discharge status: Home / Self Care | Source: Ambulatory Visit | Attending: Otolaryngology | Admitting: Otolaryngology

## 2013-10-19 ENCOUNTER — Other Ambulatory Visit: Payer: Self-pay | Admitting: Otolaryngology

## 2013-10-19 DIAGNOSIS — H9203 Otalgia, bilateral: Secondary | ICD-10-CM

## 2013-10-22 ENCOUNTER — Ambulatory Visit (INDEPENDENT_AMBULATORY_CARE_PROVIDER_SITE_OTHER): Admitting: General Surgery

## 2013-10-22 VITALS — BP 128/78 | HR 68 | Temp 98.0°F | Resp 18 | Ht 61.0 in | Wt 208.0 lb

## 2013-10-22 DIAGNOSIS — N644 Mastodynia: Secondary | ICD-10-CM

## 2013-10-22 NOTE — Progress Notes (Signed)
HISTORY: Pt is a 63 yo F who is here for follow up for breast pain.  She originally had a small cyst that we are following, but this was downgraded on imagine and did not require imaging followup other than her normal mammograms. Around 3 weeks ago, she had an episode of severe bilateral breast pain. She felt like she she had a knot in the right lower outer breast. She had around 10 days of severe pain. She also started having pain in both axillae. Denied fevers chills. She denied any evidence of skin changes.  PERTINENT REVIEW OF SYSTEMS: Otherwise negative x 11.   Filed Vitals:   10/22/13 1125  BP: 128/78  Pulse: 68  Temp: 98 F (36.7 C)  Resp: 18   Filed Weights   10/22/13 1125  Weight: 208 lb (94.348 kg)     EXAM: Head: Normocephalic and atraumatic.  Eyes:  Conjunctivae are normal. Pupils are equal, round, and reactive to light. No scleral icterus.  Neck:  Normal range of motion. Neck supple. No tracheal deviation present. No thyromegaly present.  Resp: No respiratory distress, normal effort. Breast:  Breasts are heterogeneously dense.  Denser areas are tender throughout both breasts.  No palpable masses or lymphadenopathy.  No nipple discharge.  The area where she felt a "knot" is denser breast tissue, but does not have a palpable mass.  This is symmetric to the other side.   Abd:  Abdomen is soft, non distended and non tender. No masses are palpable.  There is no rebound and no guarding.  Neurological: Alert and oriented to person, place, and time. Coordination normal.  Skin: Skin is warm and dry. No rash noted. No diaphoretic. No erythema. No pallor.  Psychiatric: Normal mood and affect. Normal behavior. Judgment and thought content normal.      ASSESSMENT AND PLAN:   Mastodynia Ibuprofen for flares.  I do not feel any masses to biopsy or chase down on imaging.  I will follow up after next mammogram in November.         Milus Height, MD Surgical Oncology,  General & Endocrine Surgery [facility]  Tommy Medal, MD Tommy Medal, MD

## 2013-10-22 NOTE — Assessment & Plan Note (Signed)
Ibuprofen for flares.  I do not feel any masses to biopsy or chase down on imaging.  I will follow up after next mammogram in November.

## 2013-10-22 NOTE — Patient Instructions (Signed)
Continue avoiding caffeine and taking/rubbing evening primrose oil on breasts.    Take ibuprofen when you get a flare.    Follow up after next mammogram.

## 2013-12-26 ENCOUNTER — Other Ambulatory Visit (HOSPITAL_COMMUNITY): Payer: Self-pay | Admitting: Cardiovascular Disease

## 2014-02-08 ENCOUNTER — Other Ambulatory Visit: Payer: Self-pay | Admitting: Internal Medicine

## 2014-02-08 DIAGNOSIS — R51 Headache: Secondary | ICD-10-CM

## 2014-02-11 ENCOUNTER — Ambulatory Visit
Admission: RE | Admit: 2014-02-11 | Discharge: 2014-02-11 | Disposition: A | Discharge status: Home / Self Care | Source: Ambulatory Visit | Attending: Internal Medicine | Admitting: Internal Medicine

## 2014-02-11 DIAGNOSIS — R51 Headache: Secondary | ICD-10-CM

## 2014-03-04 ENCOUNTER — Other Ambulatory Visit (HOSPITAL_COMMUNITY): Payer: Self-pay | Admitting: Cardiovascular Disease

## 2014-03-08 ENCOUNTER — Other Ambulatory Visit: Payer: Self-pay

## 2014-03-08 DIAGNOSIS — Z1231 Encounter for screening mammogram for malignant neoplasm of breast: Secondary | ICD-10-CM

## 2014-04-01 ENCOUNTER — Ambulatory Visit

## 2014-04-06 ENCOUNTER — Encounter: Payer: Self-pay | Admitting: Cardiovascular Disease

## 2014-04-25 ENCOUNTER — Ambulatory Visit
Admission: RE | Admit: 2014-04-25 | Discharge: 2014-04-25 | Disposition: A | Discharge status: Home / Self Care | Source: Ambulatory Visit

## 2014-04-25 DIAGNOSIS — Z1231 Encounter for screening mammogram for malignant neoplasm of breast: Secondary | ICD-10-CM

## 2014-05-02 ENCOUNTER — Ambulatory Visit: Admitting: Pulmonary Disease

## 2014-05-31 ENCOUNTER — Telehealth: Payer: Self-pay | Admitting: Cardiovascular Disease

## 2014-05-31 NOTE — Telephone Encounter (Signed)
Pt c/o of Chest Pain: 1. Are you having CP right now? Yes... However she is not aware what it is 2. Are you experiencing any other symptoms (ex. SOB, nausea, vomiting, sweating)? No 3. How long have you been experiencing CP? Just through the night it began around 2am on 05/31/2014 4. Is your CP continuous or coming and going? Continuous 5. Have you taken Nitroglycerin? No.. She did take Ranitidine for her Acid reflux.   Comments: Pt called reports pains through the night in the middle of her chest. Believed it was Indigestion and she to medication for acid reflux. However the pain has not seised but it has moved up the right side of her chest and is currently hurting her. Please call back to discuss

## 2014-05-31 NOTE — Telephone Encounter (Signed)
Has been having pain since 1 am - "middle of her chest between breast, up the right side and into her shoulder"  She reports it hurts to move.  She took an antiacid and it helped "alittle"  She denies any SOB or N/V.  She denies any cough or recent injury.  Normal stress echo 4/13. Advised to report to ED for further eval or call PCP if she doesn't want to do that.

## 2014-10-15 ENCOUNTER — Ambulatory Visit (INDEPENDENT_AMBULATORY_CARE_PROVIDER_SITE_OTHER): Admitting: Physician Assistant

## 2014-10-15 VITALS — BP 120/80 | HR 91 | Temp 98.5°F | Ht 62.5 in | Wt 199.0 lb

## 2014-10-15 DIAGNOSIS — Z111 Encounter for screening for respiratory tuberculosis: Secondary | ICD-10-CM

## 2014-10-15 NOTE — Progress Notes (Deleted)
   Subjective:    Patient ID: Dominique Mooney, female    DOB: 12-04-1950, 64 y.o.   MRN: [id number]  HPI    Review of Systems     Objective:   Physical Exam        Assessment & Plan:

## 2014-10-15 NOTE — Progress Notes (Signed)

## 2014-10-15 NOTE — Progress Notes (Signed)
   Subjective:    Patient ID: Dominique Mooney, female    DOB: 1950/11/06, 64 y.o.   MRN: [id number]  HPI  36 yof presents for pre employment needing tb testing.   She has never tested positive before. She does not need any other vaccines for employment. 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 She denies any cp, sob, cough, night sweats, unintentional wt loss, fevers, chills.   Review of Systems See HPI.     Objective:   Physical Exam  Constitutional: She is oriented to person, place, and time. She appears well-developed and well-nourished.  Non-toxic appearance. She does not have a sickly appearance. She does not appear ill. No distress.  BP 120/80 mmHg  Pulse 91  Temp(Src) 98.5 F (36.9 C) (Oral)  Ht 5' 2.5" (1.588 m)  Wt 199 lb (90.266 kg)  BMI 35.80 kg/m2  SpO2 98%   Neurological: She is alert and oriented to person, place, and time.  Psychiatric: She has a normal mood and affect. Her speech is normal and behavior is normal.      Assessment & Plan:   37 yof presents for pre employment needing tb testing.   PPD screening test - Plan: TB Skin Test --ppd placed, rtc 48-72 hrs for check  Julieta Gutting, PA-C Physician Assistant-Certified [facility]  10/16/2014 8:10 AM

## 2014-10-15 NOTE — Patient Instructions (Signed)

## 2014-10-16 ENCOUNTER — Encounter: Payer: Self-pay | Admitting: Physician Assistant

## 2014-10-17 ENCOUNTER — Encounter (INDEPENDENT_AMBULATORY_CARE_PROVIDER_SITE_OTHER): Admitting: Radiology

## 2014-10-17 DIAGNOSIS — Z111 Encounter for screening for respiratory tuberculosis: Secondary | ICD-10-CM

## 2014-10-17 LAB — TB SKIN TEST
Induration: 0 mm
TB Skin Test: NEGATIVE

## 2014-11-12 ENCOUNTER — Ambulatory Visit (INDEPENDENT_AMBULATORY_CARE_PROVIDER_SITE_OTHER)

## 2014-11-12 ENCOUNTER — Ambulatory Visit (INDEPENDENT_AMBULATORY_CARE_PROVIDER_SITE_OTHER): Admitting: Emergency Medicine

## 2014-11-12 VITALS — BP 124/80 | HR 108 | Temp 98.6°F | Resp 18 | Ht 62.25 in | Wt 199.4 lb

## 2014-11-12 DIAGNOSIS — J209 Acute bronchitis, unspecified: Secondary | ICD-10-CM | POA: Diagnosis not present

## 2014-11-12 DIAGNOSIS — R079 Chest pain, unspecified: Secondary | ICD-10-CM

## 2014-11-12 DIAGNOSIS — J029 Acute pharyngitis, unspecified: Secondary | ICD-10-CM | POA: Diagnosis not present

## 2014-11-12 DIAGNOSIS — R05 Cough: Secondary | ICD-10-CM

## 2014-11-12 DIAGNOSIS — J45901 Unspecified asthma with (acute) exacerbation: Secondary | ICD-10-CM

## 2014-11-12 DIAGNOSIS — R059 Cough, unspecified: Secondary | ICD-10-CM

## 2014-11-12 LAB — POCT RAPID STREP A (OFFICE): RAPID STREP A SCREEN: NEGATIVE

## 2014-11-12 LAB — POCT CBC
Granulocyte percent: 73.3 %G (ref 37–80)
HCT, POC: 41.3 % (ref 37.7–47.9)
HEMOGLOBIN: 13.2 g/dL (ref 12.2–16.2)
Lymph, poc: 2.2 (ref 0.6–3.4)
MCH, POC: 27.9 pg (ref 27–31.2)
MCHC: 31.9 g/dL (ref 31.8–35.4)
MCV: 87.7 fL (ref 80–97)
MID (cbc): 0.9 (ref 0–0.9)
MPV: 7 fL (ref 0–99.8)
PLATELET COUNT, POC: 349 10*3/uL (ref 142–424)
POC GRANULOCYTE: 8.4 — AB (ref 2–6.9)
POC LYMPH PERCENT: 18.8 %L (ref 10–50)
POC MID %: 7.9 % (ref 0–12)
RBC: 4.71 M/uL (ref 4.04–5.48)
RDW, POC: 14 %
WBC: 11.5 10*3/uL — AB (ref 4.6–10.2)

## 2014-11-12 MED ORDER — ALBUTEROL SULFATE (2.5 MG/3ML) 0.083% IN NEBU
2.5000 mg | INHALATION_SOLUTION | Freq: Once | RESPIRATORY_TRACT | Status: AC
  Administered 2014-11-12: 2.5 mg via RESPIRATORY_TRACT

## 2014-11-12 MED ORDER — AZITHROMYCIN 250 MG PO TABS: ORAL_TABLET | ORAL | Status: DC

## 2014-11-12 MED ORDER — BENZONATATE 100 MG PO CAPS: 100.0000 mg | ORAL_CAPSULE | Freq: Three times a day (TID) | ORAL | Status: DC | PRN

## 2014-11-12 NOTE — Progress Notes (Addendum)
Subjective:    Patient ID: Dominique Mooney, female    DOB: 07/23/1950, 64 y.o.   MRN: [id number] This chart was scribed for Arlyss Queen, MD by Marti Sleigh, Medical Scribe. This patient was seen in Room 12 and the patient's care was started at 8:17 AM.  Chief Complaint  Patient presents with  . Breathing Problem    Trouble breathing due to sinus drainage, x3 days  . Sore Throat    x3 days    HPI HPI Comments: Dominique Mooney is a 64 y.o. female with a past hx of asthma and allergies who presents to [facility] complaining of worsening mild to moderate difficulty breathing as well as sore throat for the last three days. Pt states that her sinuses started draining five days ago, and since that time her throat and airway have been sore due to the constant drainage, and that has her breathing difficult. Pt has used her inhaler regularly, but it has given little relief for the last 24 hours. Pt states she traveled to [facility] last week and was around numerous people smoking cigarettes. Pt denies pain or swelling in extremities. Pt has had not pneumonia since she was 64 years old. Pt has never been hospitalized due to asthma. Dr. Minna Antis was her previous PCP at [facility], but he has left the practice so Dr. Ouida Sills is her new PCP at the same practice.     Review of Systems  Constitutional: Negative for fever and chills.  HENT: Positive for congestion, postnasal drip and rhinorrhea.   Respiratory: Positive for cough, chest tightness and shortness of breath.   Cardiovascular: Negative for chest pain and leg swelling.       Objective:   Physical Exam  Constitutional: She is oriented to person, place, and time. She appears well-developed and well-nourished. No distress.  HENT:  Head: Normocephalic and atraumatic.  Mouth/Throat: No oropharyngeal exudate.  Throat with erythema.  Eyes: Pupils are equal, round, and reactive to light.  Neck: Normal range of motion. Neck supple.    Cardiovascular: Normal rate, regular rhythm and normal heart sounds.   No murmur heard. Pulmonary/Chest: Effort normal and breath sounds normal. No respiratory distress. She has no wheezes. She has no rales.  Musculoskeletal: Normal range of motion. She exhibits no edema or tenderness.  There is no tenderness or swelling over the calves.  Neurological: She is alert and oriented to person, place, and time. Coordination normal.  Skin: Skin is warm and dry. She is not diaphoretic.  Psychiatric: She has a normal mood and affect. Her behavior is normal.  Nursing note and vitals reviewed. [facility] reading (PRIMARY) by  Dr. Everlene Farrier no acute disease Results for orders placed or performed in visit on 11/12/14  POCT CBC  Result Value Ref Range   WBC 11.5 (A) 4.6 - 10.2 K/uL   Lymph, poc 2.2 0.6 - 3.4   POC LYMPH PERCENT 18.8 10 - 50 %L   MID (cbc) 0.9 0 - 0.9   POC MID % 7.9 0 - 12 %M   POC Granulocyte 8.4 (A) 2 - 6.9   Granulocyte percent 73.3 37 - 80 %G   RBC 4.71 4.04 - 5.48 M/uL   Hemoglobin 13.2 12.2 - 16.2 g/dL   HCT, POC 41.3 37.7 - 47.9 %   MCV 87.7 80 - 97 fL   MCH, POC 27.9 27 - 31.2 pg   MCHC 31.9 31.8 - 35.4 g/dL   RDW, POC 14.0 %  Platelet Count, POC 349 142 - 424 K/uL   MPV 7.0 0 - 99.8 fL  POCT rapid strep A  Result Value Ref Range   Rapid Strep A Screen Negative Negative      Assessment & Plan:  1. Sore throat strep test negative  - POCT rapid strep  2. Cough - CXR does not show a penumonia. Eberwein count is elevated. Will treat with Z-Pak and Tessalon pearls.  - POCT CBC - POCT rapid strep A - DG Chest 2 View; Future - albuterol (PROVENTIL) (2.5 MG/3ML) 0.083% nebulizer solution 2.5 mg; Take 3 mLs (2.5 mg total) by nebulization once.  3. Asthma with acute exacerbation, unspecified asthma severity Will continue with albuterol treatment and symbicort  4. Chest pain, unspecified chest pain type She has a burning sensation in her chest consistent with bronchitis. She  has had a previous stress test. She states this test was normal.   I personally performed the services described in this documentation, which was scribed in my presence. The recorded information has been reviewed and is accurate.  Arlyss Queen, MD  [facility]  11/12/2014 8:58 AM  I personally performed the services described in this documentation, which was scribed in my presence. The recorded information has been reviewed and is accurate.  Nena Jordan, MD

## 2014-11-28 ENCOUNTER — Emergency Department (HOSPITAL_COMMUNITY)

## 2014-11-28 ENCOUNTER — Emergency Department (HOSPITAL_COMMUNITY)
Admission: EM | Admit: 2014-11-28 | Discharge: 2014-11-28 | Disposition: A | Discharge status: Home / Self Care | Attending: Emergency Medicine | Admitting: Emergency Medicine

## 2014-11-28 DIAGNOSIS — T148XXA Other injury of unspecified body region, initial encounter: Secondary | ICD-10-CM

## 2014-11-28 DIAGNOSIS — E039 Hypothyroidism, unspecified: Secondary | ICD-10-CM | POA: Diagnosis not present

## 2014-11-28 DIAGNOSIS — S99912A Unspecified injury of left ankle, initial encounter: Secondary | ICD-10-CM | POA: Diagnosis present

## 2014-11-28 DIAGNOSIS — S9002XA Contusion of left ankle, initial encounter: Secondary | ICD-10-CM | POA: Diagnosis not present

## 2014-11-28 DIAGNOSIS — S7001XA Contusion of right hip, initial encounter: Secondary | ICD-10-CM | POA: Diagnosis not present

## 2014-11-28 DIAGNOSIS — Z7951 Long term (current) use of inhaled steroids: Secondary | ICD-10-CM | POA: Insufficient documentation

## 2014-11-28 DIAGNOSIS — Z880 Allergy status to penicillin: Secondary | ICD-10-CM | POA: Diagnosis not present

## 2014-11-28 DIAGNOSIS — J45909 Unspecified asthma, uncomplicated: Secondary | ICD-10-CM | POA: Diagnosis not present

## 2014-11-28 DIAGNOSIS — Y939 Activity, unspecified: Secondary | ICD-10-CM | POA: Diagnosis not present

## 2014-11-28 DIAGNOSIS — Y999 Unspecified external cause status: Secondary | ICD-10-CM | POA: Diagnosis not present

## 2014-11-28 DIAGNOSIS — Y92410 Unspecified street and highway as the place of occurrence of the external cause: Secondary | ICD-10-CM | POA: Insufficient documentation

## 2014-11-28 DIAGNOSIS — W19XXXA Unspecified fall, initial encounter: Secondary | ICD-10-CM

## 2014-11-28 DIAGNOSIS — Z8719 Personal history of other diseases of the digestive system: Secondary | ICD-10-CM | POA: Insufficient documentation

## 2014-11-28 DIAGNOSIS — Z8709 Personal history of other diseases of the respiratory system: Secondary | ICD-10-CM | POA: Diagnosis not present

## 2014-11-28 DIAGNOSIS — I10 Essential (primary) hypertension: Secondary | ICD-10-CM | POA: Insufficient documentation

## 2014-11-28 DIAGNOSIS — Z79899 Other long term (current) drug therapy: Secondary | ICD-10-CM | POA: Diagnosis not present

## 2014-11-28 MED ORDER — HYDROCODONE-ACETAMINOPHEN 5-325 MG PO TABS: 1.0000 | ORAL_TABLET | Freq: Four times a day (QID) | ORAL | Status: DC | PRN

## 2014-11-28 NOTE — Discharge Instructions (Signed)
Contusion °A contusion is a deep bruise. Contusions happen when an injury causes bleeding under the skin. Signs of bruising include pain, puffiness (swelling), and discolored skin. The contusion may turn blue, purple, or yellow. °HOME CARE  °· Put ice on the injured area. °¨ Put ice in a plastic bag. °¨ Place a towel between your skin and the bag. °¨ Leave the ice on for 15-20 minutes, 03-04 times a day. °· Only take medicine as told by your doctor. °· Rest the injured area. °· If possible, raise (elevate) the injured area to lessen puffiness. °GET HELP RIGHT AWAY IF:  °· You have more bruising or puffiness. °· You have pain that is getting worse. °· Your puffiness or pain is not helped by medicine. °MAKE SURE YOU:  °· Understand these instructions. °· Will watch your condition. °· Will get help right away if you are not doing well or get worse. °Document Released: 10/23/2007 Document Revised: 07/29/2011 Document Reviewed: 03/11/2011 °ExitCare® Patient Information ©2015 [employer]. This information is not intended to replace advice given to you by your health care provider. Make sure you discuss any questions you have with your health care provider. ° °
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
## 2014-11-28 NOTE — ED Notes (Signed)
Verbalized understanding discharge instructions. In no acute distress.  Pt educated on [place].

## 2015-04-04 ENCOUNTER — Other Ambulatory Visit: Payer: Self-pay

## 2015-04-04 DIAGNOSIS — Z1231 Encounter for screening mammogram for malignant neoplasm of breast: Secondary | ICD-10-CM

## 2015-05-04 ENCOUNTER — Ambulatory Visit

## 2015-05-25 ENCOUNTER — Inpatient Hospital Stay: Admission: RE | Admit: 2015-05-25 | Source: Ambulatory Visit

## 2015-07-05 ENCOUNTER — Ambulatory Visit
Admission: RE | Admit: 2015-07-05 | Discharge: 2015-07-05 | Disposition: A | Discharge status: Home / Self Care | Source: Ambulatory Visit

## 2015-07-05 DIAGNOSIS — Z1231 Encounter for screening mammogram for malignant neoplasm of breast: Secondary | ICD-10-CM

## 2015-09-27 ENCOUNTER — Encounter: Payer: Self-pay | Admitting: Gastroenterology

## 2015-11-08 ENCOUNTER — Other Ambulatory Visit: Payer: Self-pay | Admitting: Orthopedic Surgery

## 2015-11-08 ENCOUNTER — Ambulatory Visit (HOSPITAL_COMMUNITY)
Admission: RE | Admit: 2015-11-08 | Discharge: 2015-11-08 | Disposition: A | Discharge status: Home / Self Care | Source: Ambulatory Visit | Attending: Cardiovascular Disease | Admitting: Cardiovascular Disease

## 2015-11-08 DIAGNOSIS — E785 Hyperlipidemia, unspecified: Secondary | ICD-10-CM | POA: Insufficient documentation

## 2015-11-08 DIAGNOSIS — M7989 Other specified soft tissue disorders: Secondary | ICD-10-CM | POA: Insufficient documentation

## 2015-11-08 DIAGNOSIS — K219 Gastro-esophageal reflux disease without esophagitis: Secondary | ICD-10-CM | POA: Insufficient documentation

## 2015-11-08 DIAGNOSIS — M79662 Pain in left lower leg: Secondary | ICD-10-CM

## 2015-11-08 DIAGNOSIS — I10 Essential (primary) hypertension: Secondary | ICD-10-CM | POA: Insufficient documentation

## 2015-11-08 DIAGNOSIS — M79605 Pain in left leg: Secondary | ICD-10-CM | POA: Insufficient documentation

## 2016-01-27 ENCOUNTER — Ambulatory Visit (INDEPENDENT_AMBULATORY_CARE_PROVIDER_SITE_OTHER)

## 2016-01-27 ENCOUNTER — Ambulatory Visit (INDEPENDENT_AMBULATORY_CARE_PROVIDER_SITE_OTHER): Admitting: Physician Assistant

## 2016-01-27 VITALS — BP 122/80 | HR 96 | Temp 98.3°F | Resp 16 | Ht 62.0 in | Wt 203.8 lb

## 2016-01-27 DIAGNOSIS — J3489 Other specified disorders of nose and nasal sinuses: Secondary | ICD-10-CM | POA: Diagnosis not present

## 2016-01-27 DIAGNOSIS — J302 Other seasonal allergic rhinitis: Secondary | ICD-10-CM | POA: Diagnosis not present

## 2016-01-27 LAB — POCT SEDIMENTATION RATE: POCT SED RATE: 85 mm/hr — AB (ref 0–22)

## 2016-01-27 LAB — POCT GLYCOSYLATED HEMOGLOBIN (HGB A1C): Hemoglobin A1C: 5.6

## 2016-01-27 LAB — POCT CBC
Granulocyte percent: 68.9 %G (ref 37–80)
HCT, POC: 41.2 % (ref 37.7–47.9)
Hemoglobin: 14 g/dL (ref 12.2–16.2)
LYMPH, POC: 2 (ref 0.6–3.4)
MCH, POC: 30.2 pg (ref 27–31.2)
MCHC: 34.1 g/dL (ref 31.8–35.4)
MCV: 88.7 fL (ref 80–97)
MID (CBC): 0.8 (ref 0–0.9)
MPV: 7 fL (ref 0–99.8)
POC Granulocyte: 6.3 (ref 2–6.9)
POC LYMPH PERCENT: 22.2 %L (ref 10–50)
POC MID %: 8.9 % (ref 0–12)
Platelet Count, POC: 293 10*3/uL (ref 142–424)
RBC: 4.64 M/uL (ref 4.04–5.48)
RDW, POC: 13.2 %
WBC: 9.1 10*3/uL (ref 4.6–10.2)

## 2016-01-27 MED ORDER — AZITHROMYCIN 250 MG PO TABS: ORAL_TABLET | ORAL | 0 refills | Status: DC

## 2016-01-27 MED ORDER — IBUPROFEN 200 MG PO TABS
600.0000 mg | ORAL_TABLET | Freq: Once | ORAL | Status: AC
  Administered 2016-01-27: 600 mg via ORAL

## 2016-01-27 MED ORDER — METHYLPREDNISOLONE ACETATE 40 MG/ML IJ SUSP
80.0000 mg | Freq: Once | INTRAMUSCULAR | Status: AC
  Administered 2016-01-27: 80 mg via INTRAMUSCULAR

## 2016-01-27 NOTE — Patient Instructions (Addendum)
If you develop tenderness to touch about your right temple then please return to clinic.      IF you received an x-ray today, you will receive an invoice from [facility]. Please contact [facility] at [phone] with questions or concerns regarding your invoice.   IF you received labwork today, you will receive an invoice from [employer]. Please contact [facility] at [phone] with questions or concerns regarding your invoice.   Our billing staff will not be able to assist you with questions regarding bills from these companies.  You will be contacted with the lab results as soon as they are available. The fastest way to get your results is to activate your My Chart account. Instructions are located on the last page of this paperwork. If you have not heard from [place] regarding the results in 2 weeks, please contact this office.

## 2016-01-27 NOTE — Progress Notes (Signed)
01/27/2016 1:33 PM   DOB: May 13, 1951 / MRN: [id number]  SUBJECTIVE:  Dominique Mooney is a 65 y.o. female presenting for right sided maxillary sinus pain that started about 2 days ago.  Describes the pain as a pressure and associates some upper right sided molar pain as well, along with some sinus drainage down her throat.  Reports that the pain is severe.  She denies temporal tenderness.  She feels that she is getting worse. She has tried Flonase without relief.  She has a history of seasonal allergies. She does associate some teeth pain as well.   She is allergic to penicillins.   She  has a past medical history of Allergic rhinitis; Asthma; Chest pressure; GERD (gastroesophageal reflux disease); Hyperlipidemia; Hypertension; Hypothyroidism; Irritable bowel syndrome; and Pancreatitis.    She  reports that she has never smoked. She has never used smokeless tobacco. She reports that she does not drink alcohol or use drugs. She  has no sexual activity history on file. The patient  has a past surgical history that includes Cholecystectomy; Hernia repair; Total abdominal hysterectomy; Salpingoophorectomy; BRAVO ph study (05/02/2011); and Esophagogastroduodenoscopy (05/02/2011).  Her family history includes Allergies in her brother, sister, and son; Heart attack in her brother and brother; Stroke in her father and mother.  Review of Systems  Constitutional: Negative for chills and fever.  Gastrointestinal: Negative for nausea.  Skin: Negative for itching and rash.  Neurological: Negative for dizziness and headaches.    The problem list and medications were reviewed and updated by myself where necessary and exist elsewhere in the encounter.   OBJECTIVE:  BP 122/80 (BP Location: Right Arm, Patient Position: Sitting, Cuff Size: Normal)   Pulse 96   Temp 98.3 F (36.8 C) (Oral)   Resp 16   Ht 5\' 2"  (1.575 m)   Wt 203 lb 12.8 oz (92.4 kg)   SpO2 99%   BMI 37.28 kg/m   Physical Exam  HENT:    Right Ear: Tympanic membrane normal.  Left Ear: Tympanic membrane normal.  Nose: Right sinus exhibits maxillary sinus tenderness. Right sinus exhibits no frontal sinus tenderness. Left sinus exhibits no maxillary sinus tenderness and no frontal sinus tenderness.  Mouth/Throat: Uvula is midline, oropharynx is clear and moist and mucous membranes are normal.  Cardiovascular: Normal rate and regular rhythm.   Pulmonary/Chest: Effort normal and breath sounds normal.    Results for orders placed or performed in visit on 01/27/16 (from the past 72 hour(s))  POCT CBC     Status: None   Collection Time: 01/27/16 11:50 AM  Result Value Ref Range   WBC 9.1 4.6 - 10.2 K/uL   Lymph, poc 2.0 0.6 - 3.4   POC LYMPH PERCENT 22.2 10 - 50 %L   MID (cbc) 0.8 0 - 0.9   POC MID % 8.9 0 - 12 %M   POC Granulocyte 6.3 2 - 6.9   Granulocyte percent 68.9 37 - 80 %G   RBC 4.64 4.04 - 5.48 M/uL   Hemoglobin 14.0 12.2 - 16.2 g/dL   HCT, POC 41.2 37.7 - 47.9 %   MCV 88.7 80 - 97 fL   MCH, POC 30.2 27 - 31.2 pg   MCHC 34.1 31.8 - 35.4 g/dL   RDW, POC 13.2 %   Platelet Count, POC 293 142 - 424 K/uL   MPV 7.0 0 - 99.8 fL  POCT glycosylated hemoglobin (Hb A1C)     Status: None   Collection Time: 01/27/16  12:28 PM  Result Value Ref Range   Hemoglobin A1C 5.6   POCT SEDIMENTATION RATE     Status: Abnormal   Collection Time: 01/27/16 12:49 PM  Result Value Ref Range   POCT SED RATE 85 (A) 0 - 22 mm/hr    Dg Sinuses Complete  Result Date: 01/27/2016 CLINICAL DATA:  Facial pain, right maxillary pain EXAM: PARANASAL SINUSES - COMPLETE 3 + VIEW COMPARISON:  02/11/2014 FINDINGS: The paranasal sinus are aerated. There is no evidence of sinus opacification air-fluid levels or mucosal thickening. No significant bone abnormalities are seen. IMPRESSION: Negative. Electronically Signed   By: Lahoma Crocker M.D.   On: 01/27/2016 11:57   Lab Results  Component Value Date   CREATININE 0.98 02/01/2013      ASSESSMENT AND  PLAN  Dominique Mooney was seen today for facial pain.  Diagnoses and all orders for this visit:  Sinus pain: Rads negative for sed rate very high.  Will go ahead and treat for acute maxially sinusitis along with depo-medrol for the treatment of seasonal allergies.  Will treat her pain with 600-800 mg of Ibuprofen along with 1000 mg of tylenol every eight hours as needed for pain.She is allergic to penicillins.  I have instructed her very clearly that if she develops temporal tenderness to RTC.   -     DG Sinuses Complete; Future -     POCT CBC -     POCT SEDIMENTATION RATE -     ibuprofen (ADVIL,MOTRIN) tablet 600 mg; Take 3 tablets (600 mg total) by mouth once. -     azithromycin (ZITHROMAX) 250 MG tablet; Take two on day one and one daily thereafter.  Seasonal allergies -     POCT glycosylated hemoglobin (Hb A1C) -     methylPREDNISolone acetate (DEPO-MEDROL) injection 80 mg; Inject 2 mLs (80 mg total) into the muscle once.    The patient is advised to call or return to clinic if she does not see an improvement in symptoms, or to seek the care of the closest emergency department if she worsens with the above plan.   Philis Fendt, MHS, PA-C [facility] 01/27/2016 1:33 PM

## 2016-03-18 HISTORY — PX: MENISCUS REPAIR: SHX5179

## 2016-04-19 ENCOUNTER — Encounter: Payer: Self-pay | Admitting: Gastroenterology

## 2016-04-19 ENCOUNTER — Ambulatory Visit (INDEPENDENT_AMBULATORY_CARE_PROVIDER_SITE_OTHER): Admitting: Gastroenterology

## 2016-04-19 VITALS — BP 116/78 | HR 100 | Ht 61.61 in | Wt 209.0 lb

## 2016-04-19 DIAGNOSIS — R070 Pain in throat: Secondary | ICD-10-CM | POA: Diagnosis not present

## 2016-04-19 DIAGNOSIS — K219 Gastro-esophageal reflux disease without esophagitis: Secondary | ICD-10-CM

## 2016-04-19 MED ORDER — ESOMEPRAZOLE MAGNESIUM 40 MG PO CPDR: DELAYED_RELEASE_CAPSULE | ORAL | 11 refills | Status: DC

## 2016-04-19 MED ORDER — RANITIDINE HCL 150 MG PO CAPS
150.0000 mg | ORAL_CAPSULE | Freq: Every evening | ORAL | 11 refills | Status: DC
Start: 2016-04-19 — End: 2020-01-18

## 2016-04-19 NOTE — Patient Instructions (Signed)
Restart nexium 40mg  pill, one pill 20-30 min before BF meal. Ranitidine 150mg  pill, one pill at bedtime (OTC). Stop omeprazole. Call here in 6-7 weeks to report on your response.

## 2016-04-19 NOTE — Progress Notes (Signed)
Review of gastrointestinal problems:  1. Dysphasia, nausea 2009. EGD may 2009 was normal. Symptoms thought to be GERD related. Symptoms improved with addition of H2 blocker (March 2011). Barium esophagram 2011 was normal.  2. routine risk for colon cancer, colonoscopy may 2009: hemorrhoids only. Next colonoscopy may 2019; Intermittent IBS-like discomforts that are helped with antispasmodics usually.  Repeated colonoscopy 07/2013 for some bowel habit changes, it was completely normal.  Recommended recall colonoscopy for cancer screening 07/2023. 3. post ERCP pancreatitis, 2000  4. laparoscopic cholecystectomy, 2000  5. GERD: EGD 12/12 with Bravo [employer] pH test 48 hours, done 05/02/11 while still on BID PPI (omeprazole) and bedtime H2 blocker was + for continued pathologic acid exposure. 2/13 felt much better on bid nexium than other PPI and nightly H2 blocker.  HPI: This is a  very pleasant 65 year old woman whom I last saw 2-3 years ago.  Chief complaint is intermittent GERD, odontophagia over the past several months.  Weight is up 5 pounds in 2 years since last visit here in GI office.  Odynophagia with swallowing at GE junction.    Started aroiund the summer.  She is taking omeprazole 20mg  one pill once daily.  At bedtime.  Knee surgery.    Has been nauseas but no vomiting.  Not taking nsaids.  ROS: complete GI ROS as described in HPI.  Constitutional:  No unintentional weight loss   Past Medical History:  Diagnosis Date  . Allergic rhinitis       . Asthma       . Chest pressure    a. Normal Stress Echo 05/2010  . GERD (gastroesophageal reflux disease)   . Hyperlipidemia   . Hypertension   . Hypothyroidism   . Irritable bowel syndrome   . Pancreatitis    after ERCP    Past Surgical History:  Procedure Laterality Date  . BRAVO [place] STUDY  05/02/2011   Procedure: BRAVO [place] STUDY;  Surgeon: Owens Loffler, MD;  Location: [facility];  Service: Endoscopy;  Laterality: N/A;   48 hour wirless pH test (Bravo Test)  . CHOLECYSTECTOMY    . ESOPHAGOGASTRODUODENOSCOPY  05/02/2011   Procedure: ESOPHAGOGASTRODUODENOSCOPY (EGD);  Surgeon: Owens Loffler, MD;  Location: [facility];  Service: Endoscopy;  Laterality: N/A;  . HERNIA REPAIR    . MENISCUS REPAIR Left 03/18/2016   and repair of cracked bone  . SALPINGOOPHORECTOMY    . TOTAL ABDOMINAL HYSTERECTOMY      Current Outpatient Prescriptions  Medication Sig Dispense Refill  . acetaminophen (TYLENOL) 500 MG tablet Take 500 mg by mouth every 6 (six) hours as needed for mild pain.    Marland Kitchen aspirin 81 MG tablet Take 81 mg by mouth daily.    . fish oil-omega-3 fatty acids 1000 MG capsule Take 1 g by mouth daily. Held since 1 week prior to surgery.    . fluticasone (FLONASE) 50 MCG/ACT nasal spray Place 2 sprays into both nostrils daily. 16 g 2  . levothyroxine (SYNTHROID, LEVOTHROID) 88 MCG tablet Take 88 mcg by mouth daily before breakfast.    . Multiple Vitamin (MULITIVITAMIN WITH MINERALS) TABS Take 1 tablet by mouth daily. Held since 1 week prior to surgery.    . pravastatin (PRAVACHOL) 80 MG tablet Take 80 mg by mouth daily.    Marland Kitchen PROVENTIL HFA 108 (90 BASE) MCG/ACT inhaler INHALE 2 PUFFS INTO THE LUNGS EVERY 6 (SIX) HOURS AS NEEDED. (Patient taking differently: INHALE 2 PUFFS INTO THE LUNGS EVERY 6 (SIX) HOURS AS NEEDED FOR  SHORTNESS OF BREATH) 1 Inhaler 3   No current facility-administered medications for this visit.     Allergies as of 04/19/2016 - Review Complete 04/19/2016  Allergen Reaction Noted  . Penicillins Rash     Family History  Problem Relation Age of Onset  . Stroke Mother   . Stroke Father   . Allergies Brother   . Allergies Sister   . Heart attack Brother   . Heart attack Brother   . Allergies Son   . Colon cancer Neg Hx   . Stomach cancer Neg Hx   . Esophageal cancer Neg Hx     Social History   Social History  . Marital status: Married    Spouse name: N/A  . Number of children: 2   . Years of education: N/A   Occupational History  . ADM [employer]   Social History Main Topics  . Smoking status: Never Smoker  . Smokeless tobacco: Never Used  . Alcohol use No  . Drug use: No  . Sexual activity: Not on file   Other Topics Concern  . 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   Currently retired as of 07/2012     Physical Exam: BP 116/78 (BP Location: Left Arm, Patient Position: Sitting, Cuff Size: Normal)   Pulse 100   Ht 5' 1.61" (1.565 m) Comment: height measured without shoes  Wt 209 lb (94.8 kg)   BMI 38.71 kg/m  Constitutional: generally well-appearing Psychiatric: alert and oriented x3 Abdomen: soft, nontender, nondistended, no obvious ascites, no peritoneal signs, normal bowel sounds No peripheral edema noted in lower extremities  Assessment and plan: 65 y.o. female with Intermittent GERD, odontophagia, previous history of significant GERD  I am most struck by the fact that she is really only taking minimal antiacid medication now. She is taking her husband's 20 mg omeprazole once daily, usually at bedtime. Previously her acid symptoms were significant enough that she was on twice daily Nexium as well as H2 blocker at bedtime. I think her current esophageal-like symptoms are from GERD. Possibly GERD related edema in her esophagus. Recommended she get on Nexium which helped for her in the past. We will prescribe 40 mg pills she'll take 1 before  breakfast and she will also take an H2 blocker ranitidine 150 mg pills at bedtime every night. She will call to report on her response in 5-6 weeks and sooner if any troubles.   Owens Loffler, MD [facility] 04/19/2016, 1:57 PM

## 2016-05-07 ENCOUNTER — Other Ambulatory Visit: Payer: Self-pay | Admitting: Obstetrics and Gynecology

## 2016-05-07 ENCOUNTER — Other Ambulatory Visit (HOSPITAL_COMMUNITY)
Admission: RE | Admit: 2016-05-07 | Discharge: 2016-05-07 | Disposition: A | Discharge status: Home / Self Care | Source: Ambulatory Visit | Attending: Obstetrics and Gynecology | Admitting: Obstetrics and Gynecology

## 2016-05-07 DIAGNOSIS — Z01419 Encounter for gynecological examination (general) (routine) without abnormal findings: Secondary | ICD-10-CM | POA: Insufficient documentation

## 2016-05-07 DIAGNOSIS — Z1151 Encounter for screening for human papillomavirus (HPV): Secondary | ICD-10-CM | POA: Insufficient documentation

## 2016-05-09 LAB — CYTOLOGY - PAP
Diagnosis: NEGATIVE
HPV: NOT DETECTED

## 2016-05-28 ENCOUNTER — Other Ambulatory Visit: Payer: Self-pay | Admitting: Obstetrics and Gynecology

## 2016-05-28 DIAGNOSIS — Z1231 Encounter for screening mammogram for malignant neoplasm of breast: Secondary | ICD-10-CM

## 2016-07-05 ENCOUNTER — Ambulatory Visit

## 2016-07-31 ENCOUNTER — Ambulatory Visit
Admission: RE | Admit: 2016-07-31 | Discharge: 2016-07-31 | Disposition: A | Discharge status: Home / Self Care | Source: Ambulatory Visit | Attending: Obstetrics and Gynecology | Admitting: Obstetrics and Gynecology

## 2016-07-31 DIAGNOSIS — Z1231 Encounter for screening mammogram for malignant neoplasm of breast: Secondary | ICD-10-CM

## 2016-11-26 ENCOUNTER — Other Ambulatory Visit

## 2016-11-26 ENCOUNTER — Encounter: Payer: Self-pay | Admitting: Pulmonary Disease

## 2016-11-26 ENCOUNTER — Ambulatory Visit (INDEPENDENT_AMBULATORY_CARE_PROVIDER_SITE_OTHER): Admitting: Pulmonary Disease

## 2016-11-26 DIAGNOSIS — R05 Cough: Secondary | ICD-10-CM | POA: Diagnosis not present

## 2016-11-26 DIAGNOSIS — R079 Chest pain, unspecified: Secondary | ICD-10-CM | POA: Insufficient documentation

## 2016-11-26 DIAGNOSIS — J0110 Acute frontal sinusitis, unspecified: Secondary | ICD-10-CM

## 2016-11-26 DIAGNOSIS — R059 Cough, unspecified: Secondary | ICD-10-CM

## 2016-11-26 DIAGNOSIS — R0789 Other chest pain: Secondary | ICD-10-CM

## 2016-11-26 DIAGNOSIS — J019 Acute sinusitis, unspecified: Secondary | ICD-10-CM | POA: Insufficient documentation

## 2016-11-26 MED ORDER — AZITHROMYCIN 250 MG PO TABS: ORAL_TABLET | ORAL | 0 refills | Status: DC

## 2016-11-26 MED ORDER — MONTELUKAST SODIUM 10 MG PO TABS: 10.0000 mg | ORAL_TABLET | Freq: Every day | ORAL | 3 refills | Status: DC

## 2016-11-26 NOTE — Patient Instructions (Addendum)
   Keep taking your Zantac (Ranitidine) at night.   Start taking your Nexium (Esomeprazole) every morning - 30 minutes before breakfast on an empty stomach to help with your chest discomfort.  If your chest discomfort doesn't start to improve then let me or one of your other physicians know.  Keep taking your Flonase and Symbicort as prescribed.  Call or e-mail me if you have any questions or problems before your next appointment.   TESTS ORDERED: 1. RAST Panel

## 2016-11-26 NOTE — Progress Notes (Signed)
Subjective:    Patient ID: Dominique Mooney, female    DOB: 29-Oct-1950, 66 y.o.   MRN: [id number]  HPI Patient last seen in our office in May 2015 by Dr. Halford Chessman. Previously on treatment with Symbicort. She reports she has had increased sinus congestion with pressure and drainage. She reports her throat feels "raw". She has noticed pain in her right ear. She reports she has had a mild cough that is productive of a "brown" mucus in minimal amounts. She reports she has had no sinus problems or breathing problems since 2015. She has had some mild wheezing. She does report some chest discomfort in her central, anterior chest. No pleuritic component to her pain. No abdominal pain, nausea or emesis. She reports dyspnea but feels this is more related to the pain in her chest. She reports her symptoms started last Thursday. She has been taking Cepacol over the counter to help without relief. No recent sick contacts or travel. No recent antibiotics. She has used her albuterol rescue inhaler without relief. She is using Zantac & Flonase but not Nexium. She is using Nexium as needed for a couple of months now. She will wake up at night coughing from her post-nasal drainage. She reports she is still taking Symbicort faithfully.   Review of Systems She reports she has felt "hot" at times but no chills or sweats. She denies any headache. Does feel pressure under her eyes. A pertinent 14 point review of systems is negative except as per the history of presenting illness.  Allergies  Allergen Reactions  . Penicillins Rash    Current Outpatient Prescriptions on File Prior to Visit  Medication Sig Dispense Refill  . acetaminophen (TYLENOL) 500 MG tablet Take 500 mg by mouth every 6 (six) hours as needed for mild pain.    Marland Kitchen aspirin 81 MG tablet Take 81 mg by mouth daily.    Marland Kitchen esomeprazole (NEXIUM) 40 MG capsule Take 1 capsule 30 min before breakfast. 30 capsule 11  . fish oil-omega-3 fatty acids 1000 MG capsule Take 1 g  by mouth daily. Held since 1 week prior to surgery.    . fluticasone (FLONASE) 50 MCG/ACT nasal spray Place 2 sprays into both nostrils daily. 16 g 2  . levothyroxine (SYNTHROID, LEVOTHROID) 88 MCG tablet Take 88 mcg by mouth daily before breakfast.    . Multiple Vitamin (MULITIVITAMIN WITH MINERALS) TABS Take 1 tablet by mouth daily. Held since 1 week prior to surgery.    . pravastatin (PRAVACHOL) 80 MG tablet Take 80 mg by mouth daily.    Marland Kitchen PROVENTIL HFA 108 (90 BASE) MCG/ACT inhaler INHALE 2 PUFFS INTO THE LUNGS EVERY 6 (SIX) HOURS AS NEEDED. (Patient taking differently: INHALE 2 PUFFS INTO THE LUNGS EVERY 6 (SIX) HOURS AS NEEDED FOR SHORTNESS OF BREATH) 1 Inhaler 3  . ranitidine (ZANTAC) 150 MG capsule Take 1 capsule (150 mg total) by mouth every evening. 30 capsule 11   No current facility-administered medications on file prior to visit.     Past Medical History:  Diagnosis Date  . Allergic rhinitis       . Asthma       . Chest pressure    a. Normal Stress Echo 05/2010  . GERD (gastroesophageal reflux disease)   . Hyperlipidemia   . Hypertension   . Hypothyroidism   . Irritable bowel syndrome   . Pancreatitis    after ERCP    Past Surgical History:  Procedure Laterality Date  .  BRAVO [place] STUDY  05/02/2011   Procedure: BRAVO [place] STUDY;  Surgeon: Owens Loffler, MD;  Location: [facility];  Service: Endoscopy;  Laterality: N/A;  48 hour wirless pH test (Bravo Test)  . CHOLECYSTECTOMY    . ESOPHAGOGASTRODUODENOSCOPY  05/02/2011   Procedure: ESOPHAGOGASTRODUODENOSCOPY (EGD);  Surgeon: Owens Loffler, MD;  Location: [facility];  Service: Endoscopy;  Laterality: N/A;  . HERNIA REPAIR    . MENISCUS REPAIR Left 03/18/2016   and repair of cracked bone  . SALPINGOOPHORECTOMY    . TOTAL ABDOMINAL HYSTERECTOMY      Family History  Problem Relation Age of Onset  . Stroke Mother   . Stroke Father   . Allergies Brother   . Allergies Sister   . Heart attack Brother   . Heart  attack Brother   . Allergies Son   . Colon cancer Neg Hx   . Stomach cancer Neg Hx   . Esophageal cancer Neg Hx   . Lung disease Neg Hx   . Cancer Neg Hx     Social History   Social History  . Marital status: Married    Spouse name: N/A  . Number of children: 2  . Years of education: N/A   Occupational History  . ADM [employer]   Social History Main Topics  . Smoking status: Never Smoker  . Smokeless tobacco: Never Used  . Alcohol use No  . Drug use: No  . Sexual activity: Not Asked   Other Topics Concern  . 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   Currently retired as of 07/2012      [facility] (11/26/16):   Originally from [facility] [place]. Has always lived in [place]. She retired from [employer]. 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 No pets currently. No bird exposure. No mold or hot tub exposure. Does have carpet in her bedroom. No feather bedding. She has blinds & draperies. No indoor plants. Enjoys helping out in her church and with nonprofit work. Enjoys going to the beach.       Objective:   Physical Exam BP 122/84 (BP Location: Left Arm, Patient Position: Sitting, Cuff Size: Normal)   Pulse 70   Ht 5\' 1"  (1.549 m)   Wt 202 lb 6.4 oz (91.8 kg)   SpO2 97%   BMI 38.24 kg/m  General:  Awake. Alert. No distress. Central obesity. Integument:  Warm & dry. No rash on exposed skin. No bruising on exposed skin. Extremities:  No cyanosis or clubbing.  Lymphatics:  No appreciated cervical or supraclavicular lymphadenoapthy. HEENT:  Moist mucus membranes. No oral ulcers. Moderate to severe bilateral nasal turbinate swelling with pale mucosa. Cardiovascular:  Regular rate. No edema. Normal S1 & S2.  Pulmonary:  Good aeration & clear to auscultation bilaterally. Symmetric chest wall expansion. No accessory muscle use on room air. Abdomen: Soft. Normal bowel sounds.  Nondistended. Grossly nontender. Musculoskeletal:  Normal bulk and tone. Hand grip strength 5/5 bilaterally. No joint deformity or effusion appreciated. Neurological:  CN 2-12 grossly in tact. No meningismus. Moving all 4 extremities equally. Symmetric brachioradialis deep tendon reflexes. Psychiatric:  Mood and affect congruent. Speech normal rhythm, rate & tone.   PFT 07/23/11: FVC 2.80 L (97%) FEV1 2.20 L (103%) FEV1/FVC 0.79 FEF 25-75 2.26 L (89%) negative bronchodilator response TLC 4.61 L (102%) RV 109% ERV 53% DLCO uncorrected 82%  IMAGING  PARANASAL SINUS X-RAY 01/27/16 (per radiologist):  Negative.  CXR PA/LAT/RIBS 02/01/15 (personally reviewed by me):  No parenchymal mass or opacity. No pleural effusion. Heart normal in size & mediastinum normal in contour. No rib fracture appreciated.  CTA CHEST 02/01/13 (personally reviewed by me):  No parenchymal nodule or opacity appreciated. Dependent lower lobe atelectasis noted. No pleural effusion or thickening. No pericardial effusion. No pathologic mediastinal adenopathy. No pulmonary embolism or aortic dissection.  LABS 08/02/08 IgE:  63.9    Assessment & Plan:  66 y.o. female with known history of mild, persistent asthma. Previous pulmonary function testing was normal with regards to spirometry, lung volumes, and, monoxide diffusion capacity as per my review. Reviewing her previous chest imaging showed no parenchymal disease either. Her previous IgE did not appear significantly elevated. I do question whether or not her symptoms could be due to a bacterial infection resulting in her acute sinusitis or if this is all allergy mediated. I am going to add Singulair to her regimen and also start an antibiotic. My suspicion is that her chest discomfort is worsening reflux with her postnasal drainage. As such, she will notify me if her symptoms do not improve as she resumes daily Zantac.  1. Acute frontal/maxillary sinusitis: Prescribing a Z-Pak.  Patient and notify me for lack of symptom relief. 2. Atypical chest pain: Patient to notify me if pain does not improve with initiation of daily Nexium. 3. Mild, persistent asthma: Continuing Symbicort. Consider full pulmonary function testing after next appointment. 4. Chronic allergic rhinitis: Continue Flonase. Checking serum RAST panel. Prescribing Singulair 10 mg by mouth daily at bedtime. 5. GERD: Continuing Zantac. Patient to start using Nexium daily. 6. Health maintenance: Status post Pneumovax October 2013. 7. Follow-up: Return to clinic in 4 weeks or sooner if needed.  Sonia Baller Ashok Cordia, M.D. [facility] Pager:  [phone] After 3pm or if no response, call [phone] 1:17 PM 11/26/16

## 2016-11-27 LAB — RESPIRATORY ALLERGY PROFILE REGION II ~~LOC~~
Allergen, A. alternata, m6: <0.1 kU/L
Allergen, Cedar tree, t12: <0.1 kU/L
Allergen, Comm Silver Birch, t9: <0.1 kU/L
Allergen, D pternoyssinus,d7: 0.33 kU/L — ABNORMAL HIGH
Allergen, Mouse Urine Protein, e78: <0.1 kU/L
Allergen, Oak,t7: <0.1 kU/L
Box Elder IgE: <0.1 kU/L
COCKROACH: 0.14 kU/L — AB
Cat Dander: <0.1 kU/L
D. FARINAE: 0.28 kU/L — AB
Dog Dander: <0.1 kU/L
Elm IgE: <0.1 kU/L
IgE (Immunoglobulin E), Serum: 174 kU/L — ABNORMAL HIGH (ref <115)
Johnson Grass: <0.1 kU/L
Rough Pigweed  IgE: <0.1 kU/L
Sheep Sorrel IgE: <0.1 kU/L

## 2017-01-16 ENCOUNTER — Ambulatory Visit: Admitting: Pulmonary Disease

## 2017-02-21 ENCOUNTER — Ambulatory Visit (INDEPENDENT_AMBULATORY_CARE_PROVIDER_SITE_OTHER): Admitting: Gastroenterology

## 2017-02-21 ENCOUNTER — Encounter: Payer: Self-pay | Admitting: Gastroenterology

## 2017-02-21 ENCOUNTER — Other Ambulatory Visit (INDEPENDENT_AMBULATORY_CARE_PROVIDER_SITE_OTHER)

## 2017-02-21 VITALS — BP 106/78 | HR 72 | Ht 61.61 in | Wt 203.1 lb

## 2017-02-21 DIAGNOSIS — R198 Other specified symptoms and signs involving the digestive system and abdomen: Secondary | ICD-10-CM | POA: Diagnosis not present

## 2017-02-21 DIAGNOSIS — R1011 Right upper quadrant pain: Secondary | ICD-10-CM

## 2017-02-21 DIAGNOSIS — R194 Change in bowel habit: Secondary | ICD-10-CM

## 2017-02-21 LAB — CBC WITH DIFFERENTIAL/PLATELET
BASOS ABS: 0.1 10*3/uL (ref 0.0–0.1)
Basophils Relative: 1.1 % (ref 0.0–3.0)
EOS ABS: 0.1 10*3/uL (ref 0.0–0.7)
Eosinophils Relative: 1.4 % (ref 0.0–5.0)
HEMATOCRIT: 40.4 % (ref 36.0–46.0)
Hemoglobin: 13.4 g/dL (ref 12.0–15.0)
LYMPHS PCT: 27.2 % (ref 12.0–46.0)
Lymphs Abs: 2.2 10*3/uL (ref 0.7–4.0)
MCHC: 33.3 g/dL (ref 30.0–36.0)
MCV: 92 fl (ref 78.0–100.0)
MONOS PCT: 8.1 % (ref 3.0–12.0)
Monocytes Absolute: 0.7 10*3/uL (ref 0.1–1.0)
NEUTROS ABS: 5 10*3/uL (ref 1.4–7.7)
NEUTROS PCT: 62.2 % (ref 43.0–77.0)
PLATELETS: 304 10*3/uL (ref 150.0–400.0)
RBC: 4.39 Mil/uL (ref 3.87–5.11)
RDW: 12.6 % (ref 11.5–15.5)
WBC: 8.1 10*3/uL (ref 4.0–10.5)

## 2017-02-21 LAB — COMPREHENSIVE METABOLIC PANEL
ALK PHOS: 70 U/L (ref 39–117)
ALT: 7 U/L (ref 0–35)
AST: 18 U/L (ref 0–37)
Albumin: 4.1 g/dL (ref 3.5–5.2)
BILIRUBIN TOTAL: 0.6 mg/dL (ref 0.2–1.2)
BUN: 12 mg/dL (ref 6–23)
CALCIUM: 9.3 mg/dL (ref 8.4–10.5)
CO2: 28 meq/L (ref 19–32)
CREATININE: 0.92 mg/dL (ref 0.40–1.20)
Chloride: 105 mEq/L (ref 96–112)
GFR: 79.02 mL/min (ref >60.00)
GLUCOSE: 101 mg/dL — AB (ref 70–99)
Potassium: 3.9 mEq/L (ref 3.5–5.1)
Sodium: 141 mEq/L (ref 135–145)
TOTAL PROTEIN: 7.7 g/dL (ref 6.0–8.3)

## 2017-02-21 NOTE — Progress Notes (Signed)
Review of gastrointestinal problems:  1. Dysphasia, nausea 2009. EGD may 2009 was normal. Symptoms thought to be GERD related. Symptoms improved with addition of H2 blocker (March 2011). Barium esophagram 2011 was normal.  2. routine risk for colon cancer, colonoscopy may 2009: hemorrhoids only. Next colonoscopy may 2019; Intermittent IBS-like discomforts that are helped with antispasmodics usually.  Repeated colonoscopy 07/2013 for some bowel habit changes, it was completely normal.  Recommended recall colonoscopy for cancer screening 07/2023. 3. post ERCP pancreatitis, 2000  4. laparoscopic cholecystectomy, 2000  5. GERD: EGD 12/12 with Bravo Wireless pH test 48 hours, done 05/02/11 while still on BID PPI (omeprazole) and bedtime H2 blocker was + for continued pathologic acid exposure. 2/13 felt much better on bid nexium than other PPI and nightly H2 blocker.    HPI: This is a very pleasant 66 year old woman whom I last saw a year or so ago.  Chief complaint is significant change in bowels and right upper quadrant pain  Has had normal bowels until 4 weeks ago, then became constipated. Took mag citrate bottle.  Noticed gurgling LLQ, then pains RUQ.  Took another mag citrate  Weight is down 6 pounds in about a year.  She has had no fevers or chills, no nausea or vomiting.  I do not see any imaging tests or blood tests in the Epic system for the past year or so.  ROS: complete GI ROS as described in HPI, all other review negative.  Constitutional:  No unintentional weight loss   Past Medical History:  Diagnosis Date  . Allergic rhinitis       . Asthma       . Chest pressure    a. Normal Stress Echo 05/2010  . GERD (gastroesophageal reflux disease)   . Hyperlipidemia   . Hypertension   . Hypothyroidism   . Irritable bowel syndrome   . Pancreatitis    after ERCP    Past Surgical History:  Procedure Laterality Date  . BRAVO [place] STUDY  05/02/2011   Procedure: BRAVO [place] STUDY;   Surgeon: Owens Loffler, MD;  Location: [facility];  Service: Endoscopy;  Laterality: N/A;  48 hour wirless pH test (Bravo Test)  . CHOLECYSTECTOMY    . ESOPHAGOGASTRODUODENOSCOPY  05/02/2011   Procedure: ESOPHAGOGASTRODUODENOSCOPY (EGD);  Surgeon: Owens Loffler, MD;  Location: [facility];  Service: Endoscopy;  Laterality: N/A;  . HERNIA REPAIR    . MENISCUS REPAIR Left 03/18/2016   and repair of cracked bone  . SALPINGOOPHORECTOMY    . TOTAL ABDOMINAL HYSTERECTOMY      Current Outpatient Prescriptions  Medication Sig Dispense Refill  . acetaminophen (TYLENOL) 500 MG tablet Take 500 mg by mouth every 6 (six) hours as needed for mild pain.    Marland Kitchen aspirin 81 MG tablet Take 81 mg by mouth daily.    Marland Kitchen esomeprazole (NEXIUM) 40 MG capsule Take 1 capsule 30 min before breakfast. 30 capsule 11  . fish oil-omega-3 fatty acids 1000 MG capsule Take 1 g by mouth daily. Held since 1 week prior to surgery.    . fluticasone (FLONASE) 50 MCG/ACT nasal spray Place 2 sprays into both nostrils daily. 16 g 2  . levothyroxine (SYNTHROID, LEVOTHROID) 88 MCG tablet Take 88 mcg by mouth daily before breakfast.    . montelukast (SINGULAIR) 10 MG tablet Take 1 tablet (10 mg total) by mouth at bedtime. 30 tablet 3  . Multiple Vitamin (MULITIVITAMIN WITH MINERALS) TABS Take 1 tablet by mouth daily. Held since 1 week  prior to surgery.    . pravastatin (PRAVACHOL) 80 MG tablet Take 80 mg by mouth daily.    Marland Kitchen PROVENTIL HFA 108 (90 BASE) MCG/ACT inhaler INHALE 2 PUFFS INTO THE LUNGS EVERY 6 (SIX) HOURS AS NEEDED. (Patient taking differently: INHALE 2 PUFFS INTO THE LUNGS EVERY 6 (SIX) HOURS AS NEEDED FOR SHORTNESS OF BREATH) 1 Inhaler 3  . ranitidine (ZANTAC) 150 MG capsule Take 1 capsule (150 mg total) by mouth every evening. 30 capsule 11   No current facility-administered medications for this visit.     Allergies as of 02/21/2017 - Review Complete 02/21/2017  Allergen Reaction Noted  . Penicillins Rash      Family History  Problem Relation Age of Onset  . Stroke Mother   . Stroke Father   . Allergies Brother   . Allergies Sister   . Heart attack Brother   . Heart attack Brother   . Allergies Son   . Colon cancer Neg Hx   . Stomach cancer Neg Hx   . Esophageal cancer Neg Hx   . Lung disease Neg Hx   . Cancer Neg Hx     Social History   Social History  . Marital status: Married    Spouse name: N/A  . Number of children: 2  . Years of education: N/A   Occupational History  . retired [employer]   Social History Main Topics  . Smoking status: Never Smoker  . Smokeless tobacco: Never Used  . Alcohol use No  . Drug use: No  . Sexual activity: Not on file   Other Topics Concern  . 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   Currently retired as of 07/2012      [facility] (11/26/16):   Originally from [facility]. Has always lived in [place]. She retired from [employer]. 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 No pets currently. No bird exposure. No mold or hot tub exposure. Does have carpet in her bedroom. No feather bedding. She has blinds & draperies. No indoor plants. Enjoys helping out in her church and with nonprofit work. Enjoys going to the beach.      Physical Exam: BP 106/78   Pulse 72   Ht 5' 1.61" (1.565 m)   Wt 203 lb 2 oz (92.1 kg)   BMI 37.62 kg/m  Constitutional: generally well-appearing Psychiatric: alert and oriented x3 Abdomen: soft, Mildly tender right flank, nondistended, no obvious ascites, no peritoneal signs, normal bowel sounds No peripheral edema noted in lower extremities  Assessment and plan: 66 y.o. female with significant change in bowels, right upper quadrant pain  Prior to that she had been moving her bowels every day or every other day and then about 4 weeks ago she had an abrupt change her constipation along with right upper quadrant,  right flank pains. Something is clearly changed year and she needs testing. We will start with CT scan abdomen and pelvis with IV and oral contrast, CBC, complete metabolic profile. She will also do a MiraLAX, Gatorade purge and then will start taking MiraLAX on an every day basis afterwards.  She understands she might need further testing depending on the results of the above. Colonoscopy was 3 years ago but with such an abrupt change I have concern about possible colonic pathology  Please see the "Patient Instructions" section for addition details about the plan.  Owens Loffler, MD [facility] 02/21/2017, 10:09 AM

## 2017-02-21 NOTE — Patient Instructions (Addendum)
You will be set up for a CT scan of abdomen and pelvis with IV and oral contrast for change in bowels, RUQ pains. You have been scheduled for a CT scan of the abdomen and pelvis at [facility] ([address]---this is in the same building as Press photographer).   You are scheduled on 02/27/17 at 930 am. You should arrive 15 minutes prior to your appointment time for registration. Please follow the written instructions below on the day of your exam:  WARNING: IF YOU ARE ALLERGIC TO IODINE/X-RAY DYE, PLEASE NOTIFY RADIOLOGY IMMEDIATELY AT [phone]! YOU WILL BE GIVEN A 13 HOUR PREMEDICATION PREP.  1) Do not eat or drink anything after 530 am (4 hours prior to your test) 2) You have been given 2 bottles of oral contrast to drink. The solution may taste better if refrigerated, but do NOT add ice or any other liquid to this solution. Shake well before drinking.    Drink 1 bottle of contrast @ 730 am (2 hours prior to your exam)  Drink 1 bottle of contrast @ 830 am (1 hour prior to your exam)  You may take any medications as prescribed with a small amount of water except for the following: Metformin, Glucophage, Glucovance, Avandamet, Riomet, Fortamet, Actoplus Met, Janumet, Glumetza or Metaglip. The above medications must be held the day of the exam AND 48 hours after the exam.  The purpose of you drinking the oral contrast is to aid in the visualization of your intestinal tract. The contrast solution may cause some diarrhea. Before your exam is started, you will be given a small amount of fluid to drink. Depending on your individual set of symptoms, you may also receive an intravenous injection of x-ray contrast/dye. Plan on being at [facility] for 30 minutes or longer, depending on the type of exam you are having performed.  This test typically takes 30-45 minutes to complete.  If you have any questions regarding your exam or if you need to reschedule, you may call the CT  department at [phone] between the hours of 8:00 am and 5:00 pm, Monday-Friday.  ________________________________________________________________________   Dennis Bast will have labs checked today in the basement lab.  Please head down after you check out with the front desk  (cmet, cbc).  Miralax/gatorade purge and then start once daily miralax.  Dr Ardis Hughs recommends that you complete a bowel purge (to clean out your bowels). Please do the following:  Purchase a bottle of Miralax over the counter as well as a box of 5 mg dulcolax tablets.  Take 4 dulcolax tablets.  Wait 1 hour.  You will then drink 6-8 capfuls of Miralax mixed in an adequate amount of water/juice/gatorade (you may choose which of these liquids to drink) over the next 2-3 hours.  You should expect results within 1 to 6 hours after completing the bowel purge.  You may need further testing depending on the results of the above.  Normal BMI (Body Mass Index- based on height and weight) is between 19 and 25. Your BMI today is Body mass index is 37.62 kg/m. Marland Kitchen Please consider follow up  regarding your BMI with your Primary Care Provider.

## 2017-02-26 ENCOUNTER — Encounter: Payer: Self-pay | Admitting: Pulmonary Disease

## 2017-02-26 ENCOUNTER — Ambulatory Visit (INDEPENDENT_AMBULATORY_CARE_PROVIDER_SITE_OTHER): Admitting: Pulmonary Disease

## 2017-02-26 VITALS — BP 130/80 | HR 85 | Ht 61.5 in | Wt 203.4 lb

## 2017-02-26 DIAGNOSIS — Z23 Encounter for immunization: Secondary | ICD-10-CM

## 2017-02-26 DIAGNOSIS — J4530 Mild persistent asthma, uncomplicated: Secondary | ICD-10-CM | POA: Diagnosis not present

## 2017-02-26 DIAGNOSIS — K219 Gastro-esophageal reflux disease without esophagitis: Secondary | ICD-10-CM

## 2017-02-26 DIAGNOSIS — J309 Allergic rhinitis, unspecified: Secondary | ICD-10-CM | POA: Diagnosis not present

## 2017-02-26 NOTE — Progress Notes (Signed)
Subjective:    Patient ID: Dominique Mooney, female    DOB: 11-Jul-1950, 66 y.o.   MRN: [id number]  C.C.:  Follow-up for Mild, Persistent Asthma, Chronic Allergic Rhinitis, & GERD.   HPI Previously seen in 2015 by Dr. Halford Chessman.  Mild, persistent asthma: Continued on Symbicort at last appointment. Singulair added to regimen. She reports no dyspnea, coughing or wheezing. Hasn't used her rescue inhaler or Symbicort consistently since last appointment.   Chronic allergic rhinitis: Patient started on Singulair at last appointment and she continued on Flonase. She denies any sinus congestion or drainage.   GERD: Patient advised to use Nexium daily & continue use of Zantac. She reports good control of her reflux. No dyspepsia.   Review of Systems No chest pain, pressure or tightness. No fever, chills, or sweats. No rashes or bruising.   Allergies  Allergen Reactions  . Penicillins Rash    Current Outpatient Prescriptions on File Prior to Visit  Medication Sig Dispense Refill  . acetaminophen (TYLENOL) 500 MG tablet Take 500 mg by mouth every 6 (six) hours as needed for mild pain.    Marland Kitchen aspirin 81 MG tablet Take 81 mg by mouth daily.    Marland Kitchen esomeprazole (NEXIUM) 40 MG capsule Take 1 capsule 30 min before breakfast. 30 capsule 11  . fish oil-omega-3 fatty acids 1000 MG capsule Take 1 g by mouth daily. Held since 1 week prior to surgery.    . fluticasone (FLONASE) 50 MCG/ACT nasal spray Place 2 sprays into both nostrils daily. 16 g 2  . levothyroxine (SYNTHROID, LEVOTHROID) 88 MCG tablet Take 88 mcg by mouth daily before breakfast.    . montelukast (SINGULAIR) 10 MG tablet Take 1 tablet (10 mg total) by mouth at bedtime. 30 tablet 3  . Multiple Vitamin (MULITIVITAMIN WITH MINERALS) TABS Take 1 tablet by mouth daily. Held since 1 week prior to surgery.    . pravastatin (PRAVACHOL) 80 MG tablet Take 80 mg by mouth daily.    Marland Kitchen PROVENTIL HFA 108 (90 BASE) MCG/ACT inhaler INHALE 2 PUFFS INTO THE LUNGS EVERY  6 (SIX) HOURS AS NEEDED. (Patient taking differently: INHALE 2 PUFFS INTO THE LUNGS EVERY 6 (SIX) HOURS AS NEEDED FOR SHORTNESS OF BREATH) 1 Inhaler 3  . ranitidine (ZANTAC) 150 MG capsule Take 1 capsule (150 mg total) by mouth every evening. 30 capsule 11   No current facility-administered medications on file prior to visit.     Past Medical History:  Diagnosis Date  . Allergic rhinitis       . Asthma       . Chest pressure    a. Normal Stress Echo 05/2010  . GERD (gastroesophageal reflux disease)   . Hyperlipidemia   . Hypertension   . Hypothyroidism   . Irritable bowel syndrome   . Pancreatitis    after ERCP    Past Surgical History:  Procedure Laterality Date  . BRAVO [place] STUDY  05/02/2011   Procedure: BRAVO [place] STUDY;  Surgeon: Owens Loffler, MD;  Location: [facility] ENDOSCOPY;  Service: Endoscopy;  Laterality: N/A;  48 hour wirless pH test (Bravo Test)  . CHOLECYSTECTOMY    . ESOPHAGOGASTRODUODENOSCOPY  05/02/2011   Procedure: ESOPHAGOGASTRODUODENOSCOPY (EGD);  Surgeon: Owens Loffler, MD;  Location: [facility];  Service: Endoscopy;  Laterality: N/A;  . HERNIA REPAIR    . MENISCUS REPAIR Left 03/18/2016   and repair of cracked bone  . SALPINGOOPHORECTOMY    . TOTAL ABDOMINAL HYSTERECTOMY      Family  History  Problem Relation Age of Onset  . Stroke Mother   . Stroke Father   . Allergies Brother   . Allergies Sister   . Heart attack Brother   . Heart attack Brother   . Allergies Son   . Colon cancer Neg Hx   . Stomach cancer Neg Hx   . Esophageal cancer Neg Hx   . Lung disease Neg Hx   . Cancer Neg Hx     Social History   Social History  . Marital status: Married    Spouse name: N/A  . Number of children: 2  . Years of education: N/A   Occupational History  . retired [employer]   Social History Main Topics  . Smoking status: Never Smoker  . Smokeless tobacco: Never Used  . Alcohol use No  . Drug use: No  . Sexual activity: Not Asked    Other Topics Concern  . 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   Currently retired as of 07/2012      [facility] (11/26/16):   Originally from [place] [place]. Has always lived in [place]. She retired from [employer]. 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 No pets currently. No bird exposure. No mold or hot tub exposure. Does have carpet in her bedroom. No feather bedding. She has blinds & draperies. No indoor plants. Enjoys helping out in her church and with nonprofit work. Enjoys going to the beach.       Objective:   Physical Exam BP 130/80 (BP Location: Right Arm, Patient Position: Sitting, Cuff Size: Large)   Pulse 85   Ht 5' 1.5" (1.562 m)   Wt 203 lb 6.4 oz (92.3 kg)   SpO2 98%   BMI 37.81 kg/m   General:  Awake. Alert. Mild central obesity. Integument:  Warm & dry. No rash on exposed skin. No bruising on exposed skin. Extremities:  No cyanosis or clubbing.  HEENT:  Moist mucus membranes. Moderate to severe bilateral nasal turbinate swelling with pale mucosa. No scleral icterus Cardiovascular:  Regular rate. No edema. Regular rhythm.  Pulmonary:  Clear bilaterally with auscultation. Normal work of breathing on room air. Abdomen: Soft. Normal bowel sounds. Protuberant. Musculoskeletal:  Normal bulk and tone. No joint deformity or effusion appreciated. Neurological:  Cranial nerves 2-12 grossly in tact. No meningismus. Moving all 4 extremities equally.   PFT 07/23/11: FVC 2.80 L (97%) FEV1 2.20 L (103%) FEV1/FVC 0.79 FEF 25-75 2.26 L (89%) negative bronchodilator response TLC 4.61 L (102%) RV 109% ERV 53% DLCO uncorrected 82%  IMAGING PARANASAL SINUS X-RAY 01/27/16 (per radiologist):  Negative.  CXR PA/LAT/RIBS 02/01/15 (previously reviewed by me):  No parenchymal mass or opacity. No pleural effusion. Heart normal in size & mediastinum normal in contour. No rib fracture appreciated.  CTA CHEST 02/01/13  (previously reviewed by me):  No parenchymal nodule or opacity appreciated. Dependent lower lobe atelectasis noted. No pleural effusion or thickening. No pericardial effusion. No pathologic mediastinal adenopathy. No pulmonary embolism or aortic dissection.  LABS 11/26/16 IgE: 174 RAST panel: Cockroach 0.14, D pternoyssinus 0.33, & D farinae 0.28  08/02/08 IgE:  63.9    Assessment & Plan:  66 y.o. female with mild, persistent asthma, chronic allergic rhinitis, & GERD. Overall these all are very well-controlled on her current regimen. I did counsel her on proper technique with her Flonase nasal spray given the findings  on her physical exam today. I instructed the patient to monitor her pulmonary function closely with her peak flow meter and notify us for any signs of clinical worsening. I instructed her to contact our office if she had questions or concerns before her next appointment.  1. Mild, persistent asthma: Prescribing patient peak flow meter to monitor pulmonary function. Checking full pulmonary function testing. Continuing albuterol inhaler as needed & Singulair. 2. Chronic allergic rhinitis: Patient instructed on proper technique with Flonase. Continuing Flonase and Singulair. 3. GERD: Controlled with Nexium and Zantac. No changes. 4. Health maintenance: Status post Pneumovax October 2013. Administering influenza vaccine today. Consider Prevnar vaccine at next appointment. 5. Follow-up: Return to clinic in 6 months or sooner if needed.  Sonia Baller Ashok Cordia, M.D. [facility] Pulmonary & Critical Care Pager:  [phone] After 3pm or if no response, call [phone] 3:21 PM 02/26/17

## 2017-02-26 NOTE — Addendum Note (Signed)
Addended by: Tyson Dense on: 02/26/2017 03:57 PM   Modules accepted: Orders

## 2017-02-26 NOTE — Patient Instructions (Addendum)
   Continue using your medications as prescribed and your Albuterol inhaler as needed.  You can continue to hold off on using your Symbicort.  If you need refills just call and let us know.  Remember to angle the tip of your Flonase outward, not inward, so it works better and doesn't shoot down your throat.  Use your peak flow meter twice daily for a couple of weeks and record your numbers. This will give you a baseline to keep track of your asthma. After that you should check your peak flow number 2-3 times weekly just to monitor your lung function. If it drops by more than 10% it can be an early indicator of a coming asthma exacerbation.  Call our office if you have any new breathing problems or questions before your next appointment.  TESTS ORDERED: 1. Full PFTs on or before next appointment

## 2017-02-27 ENCOUNTER — Inpatient Hospital Stay: Admission: RE | Admit: 2017-02-27 | Source: Ambulatory Visit

## 2017-02-27 ENCOUNTER — Ambulatory Visit (INDEPENDENT_AMBULATORY_CARE_PROVIDER_SITE_OTHER)
Admission: RE | Admit: 2017-02-27 | Discharge: 2017-02-27 | Disposition: A | Discharge status: Home / Self Care | Source: Ambulatory Visit | Attending: Gastroenterology | Admitting: Gastroenterology

## 2017-02-27 DIAGNOSIS — R1011 Right upper quadrant pain: Secondary | ICD-10-CM | POA: Diagnosis not present

## 2017-02-27 DIAGNOSIS — R194 Change in bowel habit: Secondary | ICD-10-CM

## 2017-02-27 MED ORDER — IOPAMIDOL (ISOVUE-300) INJECTION 61%
100.0000 mL | Freq: Once | INTRAVENOUS | Status: AC | PRN
  Administered 2017-02-27: 100 mL via INTRAVENOUS

## 2017-02-28 ENCOUNTER — Inpatient Hospital Stay: Admission: RE | Admit: 2017-02-28 | Source: Ambulatory Visit

## 2017-03-20 ENCOUNTER — Telehealth: Payer: Self-pay | Admitting: Gastroenterology

## 2017-03-20 NOTE — Telephone Encounter (Signed)
Left message on machine to call back  

## 2017-03-20 NOTE — Telephone Encounter (Signed)
Please call the patient. CT scan was normal. Ask how her bowels are after the miralax purge and then starting once daily miralax. If still significantly constipated, then she will likely need a colonoscopy given such an abrupt change.

## 2017-03-26 NOTE — Telephone Encounter (Signed)
Left message on machine to call back unable to reach letter mailed

## 2017-05-06 ENCOUNTER — Ambulatory Visit (INDEPENDENT_AMBULATORY_CARE_PROVIDER_SITE_OTHER): Admitting: Adult Health

## 2017-05-06 ENCOUNTER — Encounter: Payer: Self-pay | Admitting: Adult Health

## 2017-05-06 ENCOUNTER — Ambulatory Visit (INDEPENDENT_AMBULATORY_CARE_PROVIDER_SITE_OTHER)
Admission: RE | Admit: 2017-05-06 | Discharge: 2017-05-06 | Disposition: A | Discharge status: Home / Self Care | Source: Ambulatory Visit | Attending: Adult Health | Admitting: Adult Health

## 2017-05-06 VITALS — BP 122/74 | HR 84 | Ht 61.0 in | Wt 201.0 lb

## 2017-05-06 DIAGNOSIS — R0789 Other chest pain: Secondary | ICD-10-CM | POA: Diagnosis not present

## 2017-05-06 DIAGNOSIS — J4531 Mild persistent asthma with (acute) exacerbation: Secondary | ICD-10-CM

## 2017-05-06 LAB — NITRIC OXIDE: NITRIC OXIDE: 17

## 2017-05-06 MED ORDER — PREDNISONE 10 MG PO TABS: ORAL_TABLET | ORAL | 0 refills | Status: DC

## 2017-05-06 MED ORDER — BUDESONIDE-FORMOTEROL FUMARATE 80-4.5 MCG/ACT IN AERO: 2.0000 | INHALATION_SPRAY | Freq: Two times a day (BID) | RESPIRATORY_TRACT | 0 refills | Status: DC

## 2017-05-06 MED ORDER — LEVALBUTEROL HCL 0.63 MG/3ML IN NEBU
0.6300 mg | INHALATION_SOLUTION | Freq: Once | RESPIRATORY_TRACT | Status: AC
  Administered 2017-05-06: 0.63 mg via RESPIRATORY_TRACT

## 2017-05-06 NOTE — Patient Instructions (Addendum)
Prednisone taper over the next week Begin Symbicort  2 puffs twice daily, rinse after use Albuterol 2 puffs every 4hrs as needed for wheezing  Chest x-ray today Refer to cardiology Follow-up in Dr. Halford Chessman  In 4 weeks and As needed   Please contact office for sooner follow up if symptoms do not improve or worsen or seek emergency care.

## 2017-05-06 NOTE — Assessment & Plan Note (Signed)
Chest tightness suspect is secondary to asthma flare  Check cxr .  EKG w/ no acute process.  She has an extensive FH of CAD  Will refer to Cardiology for evaluation .   Plan  Patient Instructions  Prednisone taper over the next week Begin Symbicort  2 puffs twice daily, rinse after use Albuterol 2 puffs every 4hrs as needed for wheezing  Chest x-ray today Refer to cardiology Follow-up in Dr. Halford Chessman  In 4 weeks and As needed   Please contact office for sooner follow up if symptoms do not improve or worsen or seek emergency care.

## 2017-05-06 NOTE — Progress Notes (Signed)
@Patient  ID: Dominique Mooney, female    DOB: [birth year], 66 y.o.   MRN: [id number]  Chief Complaint  Patient presents with  . Acute Visit    asthma    Referring provider: No ref. provider found  HPI: 66 yo female followed for mild persistent asthma, chronic allergic rhinitis and GERD  TEST  07/23/11: FVC 2.80 L (97%) FEV1 2.20 L (103%) FEV1/FVC 0.79 FEF 25-75 2.26 L (89%) negative bronchodilator response TLC 4.61 L (102%) RV 109% ERV 53% DLCO uncorrected 82%  CTA CHEST 02/01/13 (previously reviewed by me):  No parenchymal nodule or opacity appreciated. Dependent lower lobe atelectasis noted. No pleural effusion or thickening. No pericardial effusion. No pathologic mediastinal adenopathy. No pulmonary embolism or aortic dissection.  LABS 11/26/16 IgE: 174 RAST panel: Cockroach 0.14, D pternoyssinus 0.33, & D farinae 0.28  05/06/2017 acute office visit: Asthma Patient presents for an acute office visit.  She complains of 1 days of sore throat, nasal drainage, post nasal drainage. Woke up early this morning with wheezing , tightness, sob . Took Proventil with some help.  She denies any fever orthopnea PND or leg swelling.  No calf pain or hemoptysis She remains on Singulair daily.  She is on Symbicort twice daily. Exhaled nitric oxide testing is 17 today  Patient does complain that she had mid chest  tightness this morning.  It has improved.  She denies any radiating pains diaphoresis nausea vomiting.  She does have a very strong family history of heart disease.  She had a negative cardiac stress test in 2013. EKG done today showed no acute changes.  With normal sinus rhythm.   Allergies  Allergen Reactions  . Penicillins Rash    Immunization History  Administered Date(s) Administered  . Influenza Split 01/19/2011, 07/21/2013, 02/27/2016  . Influenza Whole 01/18/2010, 01/19/2012  . Influenza,inj,Quad PF,6+ Mos 02/26/2017  . PPD Test 10/15/2014  . Pneumococcal Polysaccharide-23  03/03/2012    Past Medical History:  Diagnosis Date  . Allergic rhinitis       . Asthma       . Chest pressure    a. Normal Stress Echo 05/2010  . GERD (gastroesophageal reflux disease)   . Hyperlipidemia   . Hypertension   . Hypothyroidism   . Irritable bowel syndrome   . Pancreatitis    after ERCP    Tobacco History: Social History   Tobacco Use  Smoking Status Never Smoker  Smokeless Tobacco Never Used   Counseling given: Not Answered   Outpatient Encounter Medications as of 05/06/2017  Medication Sig  . acetaminophen (TYLENOL) 500 MG tablet Take 500 mg by mouth every 6 (six) hours as needed for mild pain.  Marland Kitchen aspirin 81 MG tablet Take 81 mg by mouth daily.  Marland Kitchen esomeprazole (NEXIUM) 40 MG capsule Take 1 capsule 30 min before breakfast.  . fish oil-omega-3 fatty acids 1000 MG capsule Take 1 g by mouth daily. Held since 1 week prior to surgery.  . fluticasone (FLONASE) 50 MCG/ACT nasal spray Place 2 sprays into both nostrils daily.  Marland Kitchen levothyroxine (SYNTHROID, LEVOTHROID) 88 MCG tablet Take 88 mcg by mouth daily before breakfast.  . montelukast (SINGULAIR) 10 MG tablet Take 1 tablet (10 mg total) by mouth at bedtime.  . Multiple Vitamin (MULITIVITAMIN WITH MINERALS) TABS Take 1 tablet by mouth daily. Held since 1 week prior to surgery.  . pravastatin (PRAVACHOL) 80 MG tablet Take 80 mg by mouth daily.  Marland Kitchen PROVENTIL HFA 108 (90 BASE) MCG/ACT inhaler  INHALE 2 PUFFS INTO THE LUNGS EVERY 6 (SIX) HOURS AS NEEDED. (Patient taking differently: INHALE 2 PUFFS INTO THE LUNGS EVERY 6 (SIX) HOURS AS NEEDED FOR SHORTNESS OF BREATH)  . ranitidine (ZANTAC) 150 MG capsule Take 1 capsule (150 mg total) by mouth every evening.  . budesonide-formoterol (SYMBICORT) 80-4.5 MCG/ACT inhaler Inhale 2 puffs into the lungs 2 (two) times daily.  . predniSONE (DELTASONE) 10 MG tablet 4 tabs for 2 days, then 3 tabs for 2 days, 2 tabs for 2 days, then 1 tab for 2 days, then stop  . [EXPIRED]  levalbuterol (XOPENEX) nebulizer solution 0.63 mg    No facility-administered encounter medications on file as of 05/06/2017.      Review of Systems  Constitutional:   No  weight loss, night sweats,  Fevers, chills, fatigue, or  lassitude.  HEENT:   No headaches,  Difficulty swallowing,  Tooth/dental problems, or  Sore throat,                No sneezing, itching, ear ache,  +nasal congestion, post nasal drip,   CV:  No chest pain,  Orthopnea, PND, swelling in lower extremities, anasarca, dizziness, palpitations, syncope.   GI  No heartburn, indigestion, abdominal pain, nausea, vomiting, diarrhea, change in bowel habits, loss of appetite, bloody stools.   Resp:  No chest wall deformity  Skin: no rash or lesions.  GU: no dysuria, change in color of urine, no urgency or frequency.  No flank pain, no hematuria   MS:  No joint pain or swelling.  No decreased range of motion.  No back pain.    Physical Exam  BP 122/74 (BP Location: Left Arm, Cuff Size: Large)   Pulse 84   Ht 5\' 1"  (1.549 m)   Wt 201 lb (91.2 kg)   SpO2 99%   BMI 37.98 kg/m   GEN: A/Ox3; pleasant , NAD, well nourished    HEENT:  [place]/AT,  EACs-clear, TMs-wnl, NOSE-clear, THROAT-clear, no lesions, no postnasal drip or exudate noted.   NECK:  Supple w/ fair ROM; no JVD; normal carotid impulses w/o bruits; no thyromegaly or nodules palpated; no lymphadenopathy.    RESP  Clear  P & A; w/o, wheezes/ rales/ or rhonchi. no accessory muscle use, no dullness to percussion  CARD:  RRR, no m/r/g, no peripheral edema, pulses intact, no cyanosis or clubbing.  GI:   Soft & nt; nml bowel sounds; no organomegaly or masses detected.   Musco: Warm bil, no deformities or joint swelling noted.   Neuro: alert, no focal deficits noted.    Skin: Warm, no lesions or rashes    Lab Results:  CBC  BNP No results found for: BNP  ProBNP  Imaging: No results found.   Assessment & Plan:   Mild persistent  asthma Exacerbation.  Patient was given a Xopenex nebulizer treatment the office with improved symptom control.  Will check chest x-ray today   Plan  Patient Instructions  Prednisone taper over the next week Begin Symbicort  2 puffs twice daily, rinse after use Albuterol 2 puffs every 4hrs as needed for wheezing  Chest x-ray today Refer to cardiology Follow-up in Dr. Halford Chessman  In 4 weeks and As needed   Please contact office for sooner follow up if symptoms do not improve or worsen or seek emergency care.      Atypical chest pain Chest tightness suspect is secondary to asthma flare  Check cxr .  EKG w/ no acute process.  She  has an extensive FH of CAD  Will refer to Cardiology for evaluation .   Plan  Patient Instructions  Prednisone taper over the next week Begin Symbicort  2 puffs twice daily, rinse after use Albuterol 2 puffs every 4hrs as needed for wheezing  Chest x-ray today Refer to cardiology Follow-up in Dr. Halford Chessman  In 4 weeks and As needed   Please contact office for sooner follow up if symptoms do not improve or worsen or seek emergency care.          Rexene Edison, NP 05/06/2017

## 2017-05-06 NOTE — Assessment & Plan Note (Signed)
Exacerbation.  Patient was given a Xopenex nebulizer treatment the office with improved symptom control.  Will check chest x-ray today   Plan  Patient Instructions  Prednisone taper over the next week Begin Symbicort  2 puffs twice daily, rinse after use Albuterol 2 puffs every 4hrs as needed for wheezing  Chest x-ray today Refer to cardiology Follow-up in Dr. Halford Chessman  In 4 weeks and As needed   Please contact office for sooner follow up if symptoms do not improve or worsen or seek emergency care.

## 2017-05-06 NOTE — Progress Notes (Signed)
Patient seen in the office today and instructed on use of Symbicort 80/4.7mcg.  Patient expressed understanding and demonstrated technique. Parke Poisson, CMA 05/06/17

## 2017-05-06 NOTE — Progress Notes (Signed)
Dg Chest 2 View  Result Date: 05/06/2017 CLINICAL DATA:  Chest tightness. EXAM: CHEST  2 VIEW COMPARISON:  02/01/2015 FINDINGS: The heart size and mediastinal contours are within normal limits. Both lungs are clear. The visualized skeletal structures are unremarkable. IMPRESSION: No active cardiopulmonary disease. Electronically Signed   By: Kathreen Devoid   On: 05/06/2017 12:33    I have reviewed and agree with assessment/plan.  Chesley Mires, MD [facility] Pulmonary/Critical Care 05/06/2017, 12:50 PM Pager:  [phone]

## 2017-05-07 ENCOUNTER — Telehealth: Payer: Self-pay | Admitting: Pulmonary Disease

## 2017-05-07 MED ORDER — BUDESONIDE-FORMOTEROL FUMARATE 80-4.5 MCG/ACT IN AERO: 2.0000 | INHALATION_SPRAY | Freq: Two times a day (BID) | RESPIRATORY_TRACT | 3 refills | Status: AC

## 2017-05-07 NOTE — Addendum Note (Signed)
Addended by: Parke Poisson E on: 05/07/2017 11:55 AM   Modules accepted: Orders

## 2017-05-07 NOTE — Telephone Encounter (Signed)
Notes recorded by Melvenia Needles, NP on 05/06/2017 at 4:26 PM EST Lungs clear .  Cont w/ ov recs  Please contact office for sooner follow up if symptoms do not improve or worsen or seek emergency care   Advised pt of results. Pt understood and nothing further is needed.

## 2017-05-22 ENCOUNTER — Ambulatory Visit (INDEPENDENT_AMBULATORY_CARE_PROVIDER_SITE_OTHER): Admitting: Cardiology

## 2017-05-22 ENCOUNTER — Ambulatory Visit: Admitting: Cardiology

## 2017-05-22 ENCOUNTER — Encounter: Payer: Self-pay | Admitting: Cardiology

## 2017-05-22 VITALS — BP 116/74 | HR 90 | Ht 61.0 in | Wt 202.1 lb

## 2017-05-22 DIAGNOSIS — R079 Chest pain, unspecified: Secondary | ICD-10-CM

## 2017-05-22 DIAGNOSIS — R9431 Abnormal electrocardiogram [ECG] [EKG]: Secondary | ICD-10-CM | POA: Diagnosis not present

## 2017-05-22 DIAGNOSIS — Z01812 Encounter for preprocedural laboratory examination: Secondary | ICD-10-CM | POA: Diagnosis not present

## 2017-05-22 MED ORDER — METOPROLOL TARTRATE 50 MG PO TABS
50.0000 mg | ORAL_TABLET | Freq: Once | ORAL | 0 refills | Status: DC
Start: 2017-05-22 — End: 2017-06-20

## 2017-05-22 NOTE — Progress Notes (Signed)
Cardiology Office Note:    Date:  05/22/2017   ID:  TASHARI Mooney, DOB 04/03/1951, MRN [id number]  PCP:  Fanny Skates, MD (Inactive)  Cardiologist:  Candee Furbish, MD    Referring MD: No ref. provider found     History of Present Illness:    Dominique Mooney is a 67 y.o. female here for the evaluation of chest pain at the request of Dr. Halford Chessman.  She has a history of asthma.  During her last visit and pulmonary on 05/06/17 she described chest tightness in the morning hour described as a tightness/heaviness that resolved after a few minutes.  It was pressure-like.  No radiation, no nausea.  She thought it may have been her asthma.  Unsure.  She does have a very strong family history of coronary artery disease.  Back in 2013 she underwent a cardiac stress test which was negative.  EKG was performed at that office visit, personally reviewed, which showed T wave inversions in 3, F, V4 and V5. Lost 2 brothers to MI. Palpitations.   Past Medical History:  Diagnosis Date  . Allergic rhinitis       . Asthma       . Chest pressure    a. Normal Stress Echo 05/2010  . GERD (gastroesophageal reflux disease)   . Hyperlipidemia   . Hypertension   . Hypothyroidism   . Irritable bowel syndrome   . Pancreatitis    after ERCP    Past Surgical History:  Procedure Laterality Date  . BRAVO [place] STUDY  05/02/2011   Procedure: BRAVO [place] STUDY;  Surgeon: Owens Loffler, MD;  Location: [facility];  Service: Endoscopy;  Laterality: N/A;  48 hour wirless pH test (Bravo Test)  . CHOLECYSTECTOMY    . ESOPHAGOGASTRODUODENOSCOPY  05/02/2011   Procedure: ESOPHAGOGASTRODUODENOSCOPY (EGD);  Surgeon: Owens Loffler, MD;  Location: [facility];  Service: Endoscopy;  Laterality: N/A;  . HERNIA REPAIR    . MENISCUS REPAIR Left 03/18/2016   and repair of cracked bone  . SALPINGOOPHORECTOMY    . TOTAL ABDOMINAL HYSTERECTOMY      Current Medications: Current Meds  Medication Sig  . acetaminophen (TYLENOL) 500  MG tablet Take 500 mg by mouth every 6 (six) hours as needed for mild pain.  Marland Kitchen aspirin 81 MG tablet Take 81 mg by mouth daily.  . budesonide-formoterol (SYMBICORT) 80-4.5 MCG/ACT inhaler Inhale 2 puffs into the lungs 2 (two) times daily.  . budesonide-formoterol (SYMBICORT) 80-4.5 MCG/ACT inhaler Inhale 2 puffs into the lungs 2 (two) times daily.  Marland Kitchen esomeprazole (NEXIUM) 40 MG capsule Take 1 capsule 30 min before breakfast.  . fish oil-omega-3 fatty acids 1000 MG capsule Take 1 g by mouth daily. Held since 1 week prior to surgery.  . fluticasone (FLONASE) 50 MCG/ACT nasal spray Place 2 sprays into both nostrils daily.  . indomethacin (INDOCIN) 25 MG capsule Take 25 mg by mouth as needed.  Marland Kitchen levothyroxine (SYNTHROID, LEVOTHROID) 100 MCG tablet Take 100 mcg by mouth daily before breakfast.  . montelukast (SINGULAIR) 10 MG tablet Take 1 tablet (10 mg total) by mouth at bedtime.  . Multiple Vitamin (MULITIVITAMIN WITH MINERALS) TABS Take 1 tablet by mouth daily. Held since 1 week prior to surgery.  . pravastatin (PRAVACHOL) 80 MG tablet Take 80 mg by mouth daily.  Marland Kitchen PROVENTIL HFA 108 (90 BASE) MCG/ACT inhaler INHALE 2 PUFFS INTO THE LUNGS EVERY 6 (SIX) HOURS AS NEEDED. (Patient taking differently: INHALE 2 PUFFS INTO THE LUNGS EVERY 6 (  SIX) HOURS AS NEEDED FOR SHORTNESS OF BREATH)  . ranitidine (ZANTAC) 150 MG capsule Take 1 capsule (150 mg total) by mouth every evening.  . [DISCONTINUED] predniSONE (DELTASONE) 10 MG tablet 4 tabs for 2 days, then 3 tabs for 2 days, 2 tabs for 2 days, then 1 tab for 2 days, then stop     Allergies:   Penicillins   Social History   Socioeconomic History  . Marital status: Married    Spouse name: None  . Number of children: 2  . Years of education: None  . Highest education level: None  Social Needs  . Financial resource strain: None  . Food insecurity - worry: None  . Food insecurity - inability: None  . Transportation needs - medical: None  .  Transportation needs - non-medical: None  Occupational History  . Occupation: retired    Fish farm manager: [employer]  Tobacco Use  . Smoking status: Never Smoker  . Smokeless tobacco: Never Used  Substance and Sexual Activity  . Alcohol use: No  . Drug use: No  . Sexual activity: None  Other Topics Concern  . 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   Currently retired as of 07/2012      [facility] (11/26/16):   Originally from [facility]. Has always lived in [place]. She retired from [employer]. 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 No pets currently. No bird exposure. No mold or hot tub exposure. Does have carpet in her bedroom. No feather bedding. She has blinds & draperies. No indoor plants. Enjoys helping out in her church and with nonprofit work. Enjoys going to the beach.      Family History: The patient's family history includes Allergies in her brother, sister, and son; Heart attack in her brother and brother; Stroke in her father and mother. There is no history of Colon cancer, Stomach cancer, Esophageal cancer, Lung disease, or Cancer.   ROS:   Please see the history of present illness.     All other systems reviewed and are negative.  EKGs/Labs/Other Studies Reviewed:    The following studies were reviewed today: Prior office notes, EKG, lab work  EKG:  As above  Recent Labs: 02/21/2017: ALT 7; BUN 12; Creatinine, Ser 0.92; Hemoglobin 13.4; Platelets 304.0; Potassium 3.9; Sodium 141  Recent Lipid Panel No results found for: CHOL, TRIG, HDL, CHOLHDL, VLDL, LDLCALC, LDLDIRECT  Physical Exam:    VS:  BP 116/74 (BP Location: Right Arm, Patient Position: Sitting, Cuff Size: Large)   Pulse 90   Ht 5\' 1"  (1.549 m)   Wt 202 lb 1.9 oz (91.7 kg)   SpO2 96%   BMI 38.19 kg/m     Wt Readings from Last 3 Encounters:  05/22/17 202 lb 1.9 oz (91.7 kg)  05/06/17 201 lb (91.2 kg)  02/26/17  203 lb 6.4 oz (92.3 kg)     GEN:  Well nourished, well developed in no acute distress HEENT: Normal NECK: No JVD; No carotid bruits LYMPHATICS: No lymphadenopathy CARDIAC: RRR, no murmurs, rubs, gallops RESPIRATORY:  Clear to auscultation without rales, wheezing or rhonchi  ABDOMEN: Soft, non-tender, non-distended MUSCULOSKELETAL:  No edema; No deformity  SKIN: Warm and dry NEUROLOGIC:  Alert and oriented x 3 PSYCHIATRIC:  Normal affect   ASSESSMENT:    1. Abnormal EKG   2. Chest pain, unspecified type   3. Pre-procedure lab exam  PLAN:    In order of problems listed above:  Chest tightness/abnormal EKG/family history of CAD -Given her family history, abnormal EKG with T wave inversion, I would like to go ahead and pursue coronary CT scan for evaluation of her coronary anatomy.  She can take 50 of Lopressor prior to study.  If study is abnormal, we will pursue cardiac catheterization.  She used to work in short stay at [employer].  We will also check an echocardiogram to ensure proper structure and function.  Her brother has had valve surgery in the past.  Mild persistent asthma -Symbicort.  Non-smoker.  Developed asthma later in her life.  Hyperlipidemia -Pravachol  Medication Adjustments/Labs and Tests Ordered: Current medicines are reviewed at length with the patient today.  Concerns regarding medicines are outlined above.  Orders Placed This Encounter  Procedures  . CT CORONARY MORPH W/CTA COR W/SCORE W/CA W/CM &/OR WO/CM  . CT CORONARY FRACTIONAL FLOW RESERVE DATA PREP  . CT CORONARY FRACTIONAL FLOW RESERVE FLUID ANALYSIS  . Basic metabolic panel  . ECHOCARDIOGRAM COMPLETE   Meds ordered this encounter  Medications  . metoprolol tartrate (LOPRESSOR) 50 MG tablet    Sig: Take 1 tablet (50 mg total) by mouth once for 1 dose. Take 1 tablet the morning of your CT scan    Dispense:  1 tablet    Refill:  0    Signed, Candee Furbish, MD  05/22/2017 5:42 PM    [facility]

## 2017-05-22 NOTE — Patient Instructions (Addendum)
Medication Instructions:  You will need to take Lopressor 50 mg the morning of your CT scan.  This has been sent into your pharmacy. Continue all other medications as listed.  Labwork: Please have blood work to check your kidney function prior to your CT scan.  Testing/Procedures: Your physician has requested that you have an echocardiogram. Echocardiography is a painless test that uses sound waves to create images of your heart. It provides your doctor with information about the size and shape of your heart and how well your heart's chambers and valves are working. This procedure takes approximately one hour. There are no restrictions for this procedure.  Your physician has requested that you have cardiac CT. Cardiac computed tomography (CT) is a painless test that uses an x-ray machine to take clear, detailed pictures of your heart. For further information please visit [email or web address].   Follow-Up: Follow up as needed after the above testing.  If you need a refill on your cardiac medications before your next appointment, please call your pharmacy.  Thank you for choosing [facility]!!     Please arrive at the [facility] main entrance of [facility] at TBA AM (30-45 minutes prior to test start time)  [facility] [address] [place], [place] [address] [phone]  Proceed to the [facility] Radiology Department (First Floor).  Please follow these instructions carefully (unless otherwise directed):  Hold all erectile dysfunction medications at least 48 hours prior to test.  On the Night Before the Test: . Drink plenty of water. . Do not consume any caffeinated/decaffeinated beverages or chocolate 12 hours prior to your test. . Do not take any antihistamines 12 hours prior to your test. . If you take Metformin do not take 24 hours prior to test.   On the Day of the Test: . Drink plenty of water. Do not drink any water within one hour of  the test. . Do not eat any food 4 hours prior to the test. . You may take your regular medications prior to the test. . IF NOT ON A BETA BLOCKER - Take 50 mg of lopressor (metoprolol) one hour before the test. . HOLD Furosemide morning of the test.  After the Test: . Drink plenty of water. . After receiving IV contrast, you may experience a mild flushed feeling. This is normal. . On occasion, you may experience a mild rash up to 24 hours after the test. This is not dangerous. If this occurs, you can take Benadryl 25 mg and increase your fluid intake. . If you experience trouble breathing, this can be serious. If it is severe call 911 IMMEDIATELY. If it is mild, please call our office. . If you take any of these medications: Glipizide/Metformin, Avandament, Glucavance, please do not take 48 hours after completing test.

## 2017-06-04 ENCOUNTER — Encounter: Payer: Self-pay | Admitting: Pulmonary Disease

## 2017-06-04 ENCOUNTER — Ambulatory Visit (INDEPENDENT_AMBULATORY_CARE_PROVIDER_SITE_OTHER): Admitting: Pulmonary Disease

## 2017-06-04 VITALS — BP 124/84 | HR 78 | Ht 61.0 in | Wt 203.4 lb

## 2017-06-04 DIAGNOSIS — J4540 Moderate persistent asthma, uncomplicated: Secondary | ICD-10-CM | POA: Diagnosis not present

## 2017-06-04 NOTE — Progress Notes (Signed)
[facility]  Chief Complaint  Patient presents with  . Follow-up    Pt doing well in last 4 weeks with asthma    Vital signs: BP 124/84 (BP Location: Left Arm, Cuff Size: Normal)   Pulse 78   Ht 5\' 1"  (1.549 m)   Wt 203 lb 6.4 oz (92.3 kg)   SpO2 97%   BMI 38.43 kg/m   History of Present Illness: Dominique Mooney is a 67 y.o. female allergic asthma.  She has been doing better since recent exacerbation.  Using symbicort bid.  Not using singulair or flonase.  Still using zyrtec.  Not having cough, wheeze, sputum, chest pain, fever, skin rash.  She has Echo and cardiac MRI set up.  Physical Exam:  General - pleasant Eyes - pupils reactive ENT - no sinus tenderness, no oral exudate, no LAN Cardiac - regular, no murmur Chest - no wheeze, rales Abd - soft, non tender Ext - no edema Skin - no rashes Neuro - normal strength Psych - normal mood   Assessment/Plan:  Allergic asthma. - continue symbicort and prn albuterol - she can try changing zyrtec to prn  Atypical chest pain. - she has Echo and cardiac MRI set up   Patient Instructions  Follow up in 6 months    Chesley Mires, MD [place] 06/04/2017, 9:53 AM Pager:  [phone]  Flow Sheet  Pulmonary tests: PFT 01/05/08>>FEV1 2.36 (106%), TLC 4.16 (92%), DLCO 89%, no BD response RAST 08/02/08>>negative, IgE 63.9 PFT 07/23/11>>FEV1 2.28(107%), FEV1% 79, TLC 4.61(102%), DLCO 82%, no BD. CT chest 02/01/13 >> normal RAST 11/26/16 >> IgE 174, cockroach, dust mites  Past Medical History: She  has a past medical history of Allergic rhinitis, Asthma, Chest pressure, GERD (gastroesophageal reflux disease), Hyperlipidemia, Hypertension, Hypothyroidism, Irritable bowel syndrome, and Pancreatitis.  Past Surgical History: She  has a past surgical history that includes Cholecystectomy; Hernia repair; Total abdominal hysterectomy; Salpingoophorectomy; BRAVO ph study  (05/02/2011); Esophagogastroduodenoscopy (05/02/2011); and Meniscus repair (Left, 03/18/2016).  Family History: Her family history includes Allergies in her brother, sister, and son; Heart attack in her brother and brother; Stroke in her father and mother.  Social History: She  reports that  has never smoked. she has never used smokeless tobacco. She reports that she does not drink alcohol or use drugs.  Medications: Allergies as of 06/04/2017      Reactions   Penicillins Rash      Medication List        Accurate as of 06/04/17  9:53 AM. Always use your most recent med list.          acetaminophen 500 MG tablet Commonly known as:  TYLENOL Take 500 mg by mouth every 6 (six) hours as needed for mild pain.   aspirin 81 MG tablet Take 81 mg by mouth daily.   budesonide-formoterol 80-4.5 MCG/ACT inhaler Commonly known as:  SYMBICORT Inhale 2 puffs into the lungs 2 (two) times daily.   esomeprazole 40 MG capsule Commonly known as:  NEXIUM Take 1 capsule 30 min before breakfast.   fish oil-omega-3 fatty acids 1000 MG capsule Take 1 g by mouth daily. Held since 1 week prior to surgery.   fluticasone 50 MCG/ACT nasal spray Commonly known as:  FLONASE Place 2 sprays into both nostrils daily.   indomethacin 25 MG capsule Commonly known as:  INDOCIN Take 25 mg by mouth as needed.   levothyroxine 100 MCG tablet Commonly known as:  SYNTHROID,  LEVOTHROID Take 100 mcg by mouth daily before breakfast.   metoprolol tartrate 50 MG tablet Commonly known as:  LOPRESSOR Take 1 tablet (50 mg total) by mouth once for 1 dose. Take 1 tablet the morning of your CT scan   multivitamin with minerals Tabs tablet Take 1 tablet by mouth daily. Held since 1 week prior to surgery.   pravastatin 80 MG tablet Commonly known as:  PRAVACHOL Take 80 mg by mouth daily.   PROVENTIL HFA 108 (90 Base) MCG/ACT inhaler Generic drug:  albuterol INHALE 2 PUFFS INTO THE LUNGS EVERY 6 (SIX) HOURS AS  NEEDED.   ranitidine 150 MG capsule Commonly known as:  ZANTAC Take 1 capsule (150 mg total) by mouth every evening.

## 2017-06-04 NOTE — Patient Instructions (Signed)
Follow up in 6 months 

## 2017-06-05 ENCOUNTER — Other Ambulatory Visit: Payer: Self-pay

## 2017-06-05 ENCOUNTER — Ambulatory Visit (HOSPITAL_COMMUNITY): Attending: Cardiovascular Disease

## 2017-06-05 ENCOUNTER — Other Ambulatory Visit: Admitting: *Deleted

## 2017-06-05 DIAGNOSIS — I10 Essential (primary) hypertension: Secondary | ICD-10-CM | POA: Insufficient documentation

## 2017-06-05 DIAGNOSIS — Z01812 Encounter for preprocedural laboratory examination: Secondary | ICD-10-CM

## 2017-06-05 DIAGNOSIS — E785 Hyperlipidemia, unspecified: Secondary | ICD-10-CM | POA: Insufficient documentation

## 2017-06-05 DIAGNOSIS — R079 Chest pain, unspecified: Secondary | ICD-10-CM | POA: Diagnosis not present

## 2017-06-05 DIAGNOSIS — J45909 Unspecified asthma, uncomplicated: Secondary | ICD-10-CM | POA: Diagnosis not present

## 2017-06-05 DIAGNOSIS — R9431 Abnormal electrocardiogram [ECG] [EKG]: Secondary | ICD-10-CM | POA: Diagnosis not present

## 2017-06-05 LAB — BASIC METABOLIC PANEL
BUN/Creatinine Ratio: 13 (ref 12–28)
BUN: 13 mg/dL (ref 8–27)
CALCIUM: 9.4 mg/dL (ref 8.7–10.3)
CO2: 22 mmol/L (ref 20–29)
Chloride: 102 mmol/L (ref 96–106)
Creatinine, Ser: 0.97 mg/dL (ref 0.57–1.00)
GFR, EST AFRICAN AMERICAN: 71 mL/min/{1.73_m2} (ref >59)
GFR, EST NON AFRICAN AMERICAN: 62 mL/min/{1.73_m2} (ref >59)
Glucose: 91 mg/dL (ref 65–99)
Potassium: 4.3 mmol/L (ref 3.5–5.2)
Sodium: 141 mmol/L (ref 134–144)

## 2017-06-17 DIAGNOSIS — R0789 Other chest pain: Secondary | ICD-10-CM | POA: Diagnosis not present

## 2017-06-18 ENCOUNTER — Ambulatory Visit (HOSPITAL_COMMUNITY)
Admission: RE | Admit: 2017-06-18 | Discharge: 2017-06-18 | Disposition: A | Discharge status: Home / Self Care | Source: Ambulatory Visit | Attending: Cardiology | Admitting: Cardiology

## 2017-06-18 DIAGNOSIS — R079 Chest pain, unspecified: Secondary | ICD-10-CM

## 2017-06-18 DIAGNOSIS — I25119 Atherosclerotic heart disease of native coronary artery with unspecified angina pectoris: Secondary | ICD-10-CM | POA: Diagnosis not present

## 2017-06-18 DIAGNOSIS — R9431 Abnormal electrocardiogram [ECG] [EKG]: Secondary | ICD-10-CM

## 2017-06-18 MED ORDER — IOPAMIDOL (ISOVUE-370) INJECTION 76%
INTRAVENOUS | Status: AC
  Administered 2017-06-18: 100 mL
  Filled 2017-06-18: qty 100

## 2017-06-18 MED ORDER — NITROGLYCERIN 0.4 MG SL SUBL
SUBLINGUAL_TABLET | SUBLINGUAL | Status: AC
  Filled 2017-06-18: qty 2

## 2017-06-18 MED ORDER — NITROGLYCERIN 0.4 MG SL SUBL
0.8000 mg | SUBLINGUAL_TABLET | Freq: Once | SUBLINGUAL | Status: AC
  Administered 2017-06-18: 0.8 mg via SUBLINGUAL
  Filled 2017-06-18: qty 25

## 2017-06-19 ENCOUNTER — Encounter: Payer: Self-pay | Admitting: Cardiology

## 2017-06-20 ENCOUNTER — Encounter: Payer: Self-pay | Admitting: Cardiology

## 2017-06-20 ENCOUNTER — Encounter: Payer: Self-pay | Admitting: *Deleted

## 2017-06-20 ENCOUNTER — Ambulatory Visit (INDEPENDENT_AMBULATORY_CARE_PROVIDER_SITE_OTHER): Admitting: Cardiology

## 2017-06-20 VITALS — BP 130/80 | HR 95 | Ht 61.0 in | Wt 202.8 lb

## 2017-06-20 DIAGNOSIS — I2510 Atherosclerotic heart disease of native coronary artery without angina pectoris: Secondary | ICD-10-CM | POA: Diagnosis not present

## 2017-06-20 DIAGNOSIS — R0789 Other chest pain: Secondary | ICD-10-CM

## 2017-06-20 DIAGNOSIS — R931 Abnormal findings on diagnostic imaging of heart and coronary circulation: Secondary | ICD-10-CM | POA: Diagnosis not present

## 2017-06-20 DIAGNOSIS — I10 Essential (primary) hypertension: Secondary | ICD-10-CM | POA: Diagnosis not present

## 2017-06-20 DIAGNOSIS — Z01812 Encounter for preprocedural laboratory examination: Secondary | ICD-10-CM | POA: Diagnosis not present

## 2017-06-20 MED ORDER — METOPROLOL SUCCINATE ER 25 MG PO TB24: 25.0000 mg | ORAL_TABLET | Freq: Every day | ORAL | 3 refills | Status: DC

## 2017-06-20 MED ORDER — ATORVASTATIN CALCIUM 40 MG PO TABS: 40.0000 mg | ORAL_TABLET | Freq: Every day | ORAL | 3 refills | Status: DC

## 2017-06-20 NOTE — Patient Instructions (Addendum)
Medication Instructions:  Please stop Pravastatin and start Atorvastatin. Continue all other medications as listed.  Labwork: Please have blood work today (CBC, CMP and INR)  Testing/Procedures: Your physician has requested that you have a cardiac catheterization. Cardiac catheterization is used to diagnose and/or treat various heart conditions. Doctors may recommend this procedure for a number of different reasons. The most common reason is to evaluate chest pain. Chest pain can be a symptom of coronary artery disease (CAD), and cardiac catheterization can show whether plaque is narrowing or blocking your heart's arteries. This procedure is also used to evaluate the valves, as well as measure the blood flow and oxygen levels in different parts of your heart. For further information please visit [email or web address].[email or web address]. Please follow instruction sheet, as given.  Follow-Up: Follow up in 1 month with Dr Marlou Porch.  If you need a refill on your cardiac medications before your next appointment, please call your pharmacy.  Thank you for choosing [facility]!!

## 2017-06-20 NOTE — H&P (View-Only) (Signed)
Cardiology Office Note:    Date:  06/20/2017   ID:  Dominique Mooney, DOB Nov 10, 1950, MRN [id number]  PCP:  Fanny Skates, MD (Inactive)  Cardiologist:  Candee Furbish, MD    Referring MD: No ref. provider found     History of Present Illness:    Dominique Mooney is a 67 y.o. female here for the evaluation of chest pain at the request of Dr. Halford Chessman.  She has a history of asthma.  During her last visit and pulmonary on 05/06/17 she described chest tightness in the morning hour described as a tightness/heaviness that resolved after a few minutes.  It was pressure-like.  No radiation, no nausea.  She thought it may have been her asthma.  Unsure.  She does have a very strong family history of coronary artery disease.  Back in 2013 she underwent a cardiac stress test which was negative.  EKG was performed at that office visit, personally reviewed, which showed T wave inversions in 3, F, V4 and V5. Lost 2 brothers to MI. Palpitations.   Past Medical History:  Diagnosis Date  . Allergic rhinitis       . Asthma       . Chest pressure    a. Normal Stress Echo 05/2010  . GERD (gastroesophageal reflux disease)   . Hyperlipidemia   . Hypertension   . Hypothyroidism   . Irritable bowel syndrome   . Pancreatitis    after ERCP    Past Surgical History:  Procedure Laterality Date  . BRAVO [place] STUDY  05/02/2011   Procedure: BRAVO [place] STUDY;  Surgeon: Owens Loffler, MD;  Location: [facility];  Service: Endoscopy;  Laterality: N/A;  48 hour wirless pH test (Bravo Test)  . CHOLECYSTECTOMY    . ESOPHAGOGASTRODUODENOSCOPY  05/02/2011   Procedure: ESOPHAGOGASTRODUODENOSCOPY (EGD);  Surgeon: Owens Loffler, MD;  Location: [facility];  Service: Endoscopy;  Laterality: N/A;  . HERNIA REPAIR    . MENISCUS REPAIR Left 03/18/2016   and repair of cracked bone  . SALPINGOOPHORECTOMY    . TOTAL ABDOMINAL HYSTERECTOMY      Current Medications: Current Meds  Medication Sig  . acetaminophen (TYLENOL) 500  MG tablet Take 500 mg by mouth every 6 (six) hours as needed for mild pain.  Marland Kitchen aspirin 81 MG tablet Take 81 mg by mouth daily.  . budesonide-formoterol (SYMBICORT) 80-4.5 MCG/ACT inhaler Inhale 2 puffs into the lungs 2 (two) times daily.  Marland Kitchen esomeprazole (NEXIUM) 40 MG capsule Take 1 capsule 30 min before breakfast.  . fish oil-omega-3 fatty acids 1000 MG capsule Take 1 g by mouth daily. Held since 1 week prior to surgery.  . fluticasone (FLONASE) 50 MCG/ACT nasal spray Place 2 sprays into both nostrils daily. (Patient taking differently: Place 1 spray into both nostrils daily as needed. )  . indomethacin (INDOCIN) 25 MG capsule Take 25 mg by mouth as needed.  Marland Kitchen levothyroxine (SYNTHROID, LEVOTHROID) 100 MCG tablet Take 100 mcg by mouth daily before breakfast.  . Multiple Vitamin (MULITIVITAMIN WITH MINERALS) TABS Take 1 tablet by mouth daily. Held since 1 week prior to surgery.  Marland Kitchen PROVENTIL HFA 108 (90 BASE) MCG/ACT inhaler INHALE 2 PUFFS INTO THE LUNGS EVERY 6 (SIX) HOURS AS NEEDED. (Patient taking differently: INHALE 2 PUFFS INTO THE LUNGS EVERY 6 (SIX) HOURS AS NEEDED FOR SHORTNESS OF BREATH)  . ranitidine (ZANTAC) 150 MG capsule Take 1 capsule (150 mg total) by mouth every evening.     Allergies:   Penicillins  Social History   Socioeconomic History  . Marital status: Married    Spouse name: Not on file  . Number of children: 2  . Years of education: Not on file  . Highest education level: Not on file  Social Needs  . Financial resource strain: Not on file  . Food insecurity - worry: Not on file  . Food insecurity - inability: Not on file  . Transportation needs - medical: Not on file  . Transportation needs - non-medical: Not on file  Occupational History  . Occupation: retired    Fish farm manager: [employer]  Tobacco Use  . Smoking status: Never Smoker  . Smokeless tobacco: Never Used  Substance and Sexual Activity  . Alcohol use: No  . Drug use: No  . Sexual  activity: Not on file  Other Topics Concern  . 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   Currently retired as of 07/2012      [facility] (11/26/16):   Originally from [facility]. Has always lived in [place]. She retired from [employer]. 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 No pets currently. No bird exposure. No mold or hot tub exposure. Does have carpet in her bedroom. No feather bedding. She has blinds & draperies. No indoor plants. Enjoys helping out in her church and with nonprofit work. Enjoys going to the beach.      Family History: The patient's family history includes Allergies in her brother, sister, and son; Heart attack in her brother and brother; Stroke in her father and mother. There is no history of Colon cancer, Stomach cancer, Esophageal cancer, Lung disease, or Cancer.   ROS:   Please see the history of present illness.     All other systems reviewed and are negative.  EKGs/Labs/Other Studies Reviewed:    The following studies were reviewed today: Prior office notes, EKG, lab work  EKG:  As above  Recent Labs: 02/21/2017: ALT 7; Hemoglobin 13.4; Platelets 304.0 06/05/2017: BUN 13; Creatinine, Ser 0.97; Potassium 4.3; Sodium 141  Recent Lipid Panel No results found for: CHOL, TRIG, HDL, CHOLHDL, VLDL, LDLCALC, LDLDIRECT  Physical Exam:    VS:  BP 130/80 (BP Location: Left Arm, Patient Position: Sitting, Cuff Size: Large)   Pulse 95   Ht 5\' 1"  (1.549 m)   Wt 202 lb 12.8 oz (92 kg)   SpO2 100% Comment: at rest  BMI 38.32 kg/m     Wt Readings from Last 3 Encounters:  06/20/17 202 lb 12.8 oz (92 kg)  06/04/17 203 lb 6.4 oz (92.3 kg)  05/22/17 202 lb 1.9 oz (91.7 kg)     GEN:  Well nourished, well developed in no acute distress HEENT: Normal NECK: No JVD; No carotid bruits LYMPHATICS: No lymphadenopathy CARDIAC: RRR, no murmurs, rubs, gallops RESPIRATORY:  Clear to  auscultation without rales, wheezing or rhonchi  ABDOMEN: Soft, non-tender, non-distended MUSCULOSKELETAL:  No edema; No deformity  SKIN: Warm and dry NEUROLOGIC:  Alert and oriented x 3 PSYCHIATRIC:  Normal affect   ASSESSMENT:    1. Coronary artery disease involving native coronary artery of native heart without angina pectoris   2. Essential hypertension, benign   3. Atypical chest pain   4. Abnormal findings diagnostic imaging of heart and coronary circulation   5. Pre-procedure lab exam    PLAN:    In order of problems listed above:  Coronary  artery disease -Coronary CT scan results reviewed.  FFR demonstrated 0.74 in the distal LAD distribution.  The proximal and mid region did not show any evidence of flow limitation.  We will go ahead and proceed with angiogram.  Risks and benefits including stroke, heart attack, death, renal impairment, bleeding have been discussed.  She is willing to proceed.  She is quite anxious given her family history of CAD.  We will also start her on metoprolol 25 mg once a day extended release and change her statin to atorvastatin.  I spent a lengthy amount of time discussing prevention.  Her EF is normal.  She was a bit tearful at times about this diagnosis given her family history.   Her brother has had valve surgery in the past.  Mild persistent asthma -Symbicort.  Non-smoker.  Developed asthma later in her life.  Hyperlipidemia -We will go ahead and transition her from pravastatin over to atorvastatin 40 mg, high intensity statin given her coronary artery disease.  Medication Adjustments/Labs and Tests Ordered: Current medicines are reviewed at length with the patient today.  Concerns regarding medicines are outlined above.  Orders Placed This Encounter  Procedures  . Basic metabolic panel  . Protime-INR  . CBC   Meds ordered this encounter  Medications  . metoprolol succinate (TOPROL-XL) 25 MG 24 hr tablet    Sig: Take 1 tablet (25 mg  total) by mouth at bedtime.    Dispense:  90 tablet    Refill:  3  . atorvastatin (LIPITOR) 40 MG tablet    Sig: Take 1 tablet (40 mg total) by mouth daily.    Dispense:  90 tablet    Refill:  3    Signed, Candee Furbish, MD  06/20/2017 11:04 AM    [facility]

## 2017-06-20 NOTE — Progress Notes (Signed)
Cardiology Office Note:    Date:  06/20/2017   ID:  Dominique Mooney, DOB 1950/10/25, MRN [id number]  PCP:  Fanny Skates, MD (Inactive)  Cardiologist:  Candee Furbish, MD    Referring MD: No ref. provider found     History of Present Illness:    Dominique Mooney is a 67 y.o. female here for the evaluation of chest pain at the request of Dr. Halford Chessman.  She has a history of asthma.  During her last visit and pulmonary on 05/06/17 she described chest tightness in the morning hour described as a tightness/heaviness that resolved after a few minutes.  It was pressure-like.  No radiation, no nausea.  She thought it may have been her asthma.  Unsure.  She does have a very strong family history of coronary artery disease.  Back in 2013 she underwent a cardiac stress test which was negative.  EKG was performed at that office visit, personally reviewed, which showed T wave inversions in 3, F, V4 and V5. Lost 2 brothers to MI. Palpitations.   Past Medical History:  Diagnosis Date  . Allergic rhinitis       . Asthma       . Chest pressure    a. Normal Stress Echo 05/2010  . GERD (gastroesophageal reflux disease)   . Hyperlipidemia   . Hypertension   . Hypothyroidism   . Irritable bowel syndrome   . Pancreatitis    after ERCP    Past Surgical History:  Procedure Laterality Date  . BRAVO [place] STUDY  05/02/2011   Procedure: BRAVO [place] STUDY;  Surgeon: Owens Loffler, MD;  Location: [facility];  Service: Endoscopy;  Laterality: N/A;  48 hour wirless pH test (Bravo Test)  . CHOLECYSTECTOMY    . ESOPHAGOGASTRODUODENOSCOPY  05/02/2011   Procedure: ESOPHAGOGASTRODUODENOSCOPY (EGD);  Surgeon: Owens Loffler, MD;  Location: [facility];  Service: Endoscopy;  Laterality: N/A;  . HERNIA REPAIR    . MENISCUS REPAIR Left 03/18/2016   and repair of cracked bone  . SALPINGOOPHORECTOMY    . TOTAL ABDOMINAL HYSTERECTOMY      Current Medications: Current Meds  Medication Sig  . acetaminophen (TYLENOL) 500  MG tablet Take 500 mg by mouth every 6 (six) hours as needed for mild pain.  Marland Kitchen aspirin 81 MG tablet Take 81 mg by mouth daily.  . budesonide-formoterol (SYMBICORT) 80-4.5 MCG/ACT inhaler Inhale 2 puffs into the lungs 2 (two) times daily.  Marland Kitchen esomeprazole (NEXIUM) 40 MG capsule Take 1 capsule 30 min before breakfast.  . fish oil-omega-3 fatty acids 1000 MG capsule Take 1 g by mouth daily. Held since 1 week prior to surgery.  . fluticasone (FLONASE) 50 MCG/ACT nasal spray Place 2 sprays into both nostrils daily. (Patient taking differently: Place 1 spray into both nostrils daily as needed. )  . indomethacin (INDOCIN) 25 MG capsule Take 25 mg by mouth as needed.  Marland Kitchen levothyroxine (SYNTHROID, LEVOTHROID) 100 MCG tablet Take 100 mcg by mouth daily before breakfast.  . Multiple Vitamin (MULITIVITAMIN WITH MINERALS) TABS Take 1 tablet by mouth daily. Held since 1 week prior to surgery.  Marland Kitchen PROVENTIL HFA 108 (90 BASE) MCG/ACT inhaler INHALE 2 PUFFS INTO THE LUNGS EVERY 6 (SIX) HOURS AS NEEDED. (Patient taking differently: INHALE 2 PUFFS INTO THE LUNGS EVERY 6 (SIX) HOURS AS NEEDED FOR SHORTNESS OF BREATH)  . ranitidine (ZANTAC) 150 MG capsule Take 1 capsule (150 mg total) by mouth every evening.     Allergies:   Penicillins  Social History   Socioeconomic History  . Marital status: Married    Spouse name: Not on file  . Number of children: 2  . Years of education: Not on file  . Highest education level: Not on file  Social Needs  . Financial resource strain: Not on file  . Food insecurity - worry: Not on file  . Food insecurity - inability: Not on file  . Transportation needs - medical: Not on file  . Transportation needs - non-medical: Not on file  Occupational History  . Occupation: retired    Fish farm manager: [employer]  Tobacco Use  . Smoking status: Never Smoker  . Smokeless tobacco: Never Used  Substance and Sexual Activity  . Alcohol use: No  . Drug use: No  . Sexual  activity: Not on file  Other Topics Concern  . 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   Currently retired as of 07/2012      [facility] [facility] (11/26/16):   Originally from [place] [facility] Has always lived in [place]. She retired from [employer]. 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 No pets currently. No bird exposure. No mold or hot tub exposure. Does have carpet in her bedroom. No feather bedding. She has blinds & draperies. No indoor plants. Enjoys helping out in her church and with nonprofit work. Enjoys going to the beach.      Family History: The patient's family history includes Allergies in her brother, sister, and son; Heart attack in her brother and brother; Stroke in her father and mother. There is no history of Colon cancer, Stomach cancer, Esophageal cancer, Lung disease, or Cancer.   ROS:   Please see the history of present illness.     All other systems reviewed and are negative.  EKGs/Labs/Other Studies Reviewed:    The following studies were reviewed today: Prior office notes, EKG, lab work  EKG:  As above  Recent Labs: 02/21/2017: ALT 7; Hemoglobin 13.4; Platelets 304.0 06/05/2017: BUN 13; Creatinine, Ser 0.97; Potassium 4.3; Sodium 141  Recent Lipid Panel No results found for: CHOL, TRIG, HDL, CHOLHDL, VLDL, LDLCALC, LDLDIRECT  Physical Exam:    VS:  BP 130/80 (BP Location: Left Arm, Patient Position: Sitting, Cuff Size: Large)   Pulse 95   Ht 5\' 1"  (1.549 m)   Wt 202 lb 12.8 oz (92 kg)   SpO2 100% Comment: at rest  BMI 38.32 kg/m     Wt Readings from Last 3 Encounters:  06/20/17 202 lb 12.8 oz (92 kg)  06/04/17 203 lb 6.4 oz (92.3 kg)  05/22/17 202 lb 1.9 oz (91.7 kg)     GEN:  Well nourished, well developed in no acute distress HEENT: Normal NECK: No JVD; No carotid bruits LYMPHATICS: No lymphadenopathy CARDIAC: RRR, no murmurs, rubs, gallops RESPIRATORY:  Clear to  auscultation without rales, wheezing or rhonchi  ABDOMEN: Soft, non-tender, non-distended MUSCULOSKELETAL:  No edema; No deformity  SKIN: Warm and dry NEUROLOGIC:  Alert and oriented x 3 PSYCHIATRIC:  Normal affect   ASSESSMENT:    1. Coronary artery disease involving native coronary artery of native heart without angina pectoris   2. Essential hypertension, benign   3. Atypical chest pain   4. Abnormal findings diagnostic imaging of heart and coronary circulation   5. Pre-procedure lab exam    PLAN:    In order of problems listed above:  Coronary  artery disease -Coronary CT scan results reviewed.  FFR demonstrated 0.74 in the distal LAD distribution.  The proximal and mid region did not show any evidence of flow limitation.  We will go ahead and proceed with angiogram.  Risks and benefits including stroke, heart attack, death, renal impairment, bleeding have been discussed.  She is willing to proceed.  She is quite anxious given her family history of CAD.  We will also start her on metoprolol 25 mg once a day extended release and change her statin to atorvastatin.  I spent a lengthy amount of time discussing prevention.  Her EF is normal.  She was a bit tearful at times about this diagnosis given her family history.   Her brother has had valve surgery in the past.  Mild persistent asthma -Symbicort.  Non-smoker.  Developed asthma later in her life.  Hyperlipidemia -We will go ahead and transition her from pravastatin over to atorvastatin 40 mg, high intensity statin given her coronary artery disease.  Medication Adjustments/Labs and Tests Ordered: Current medicines are reviewed at length with the patient today.  Concerns regarding medicines are outlined above.  Orders Placed This Encounter  Procedures  . Basic metabolic panel  . Protime-INR  . CBC   Meds ordered this encounter  Medications  . metoprolol succinate (TOPROL-XL) 25 MG 24 hr tablet    Sig: Take 1 tablet (25 mg  total) by mouth at bedtime.    Dispense:  90 tablet    Refill:  3  . atorvastatin (LIPITOR) 40 MG tablet    Sig: Take 1 tablet (40 mg total) by mouth daily.    Dispense:  90 tablet    Refill:  3    Signed, Candee Furbish, MD  06/20/2017 11:04 AM     [facility]

## 2017-06-20 NOTE — Addendum Note (Signed)
Addended by: Candee Furbish C on: 06/20/2017 01:33 PM   Modules accepted: Orders, SmartSet

## 2017-06-21 LAB — BASIC METABOLIC PANEL
BUN/Creatinine Ratio: 13 (ref 12–28)
BUN: 12 mg/dL (ref 8–27)
CALCIUM: 9.8 mg/dL (ref 8.7–10.3)
CHLORIDE: 100 mmol/L (ref 96–106)
CO2: 22 mmol/L (ref 20–29)
Creatinine, Ser: 0.91 mg/dL (ref 0.57–1.00)
GFR calc non Af Amer: 67 mL/min/{1.73_m2} (ref >59)
GFR, EST AFRICAN AMERICAN: 77 mL/min/{1.73_m2} (ref >59)
Glucose: 94 mg/dL (ref 65–99)
Potassium: 4.3 mmol/L (ref 3.5–5.2)
Sodium: 140 mmol/L (ref 134–144)

## 2017-06-21 LAB — CBC
Hematocrit: 39.7 % (ref 34.0–46.6)
Hemoglobin: 14 g/dL (ref 11.1–15.9)
MCH: 30.2 pg (ref 26.6–33.0)
MCHC: 35.3 g/dL (ref 31.5–35.7)
MCV: 86 fL (ref 79–97)
PLATELETS: 356 10*3/uL (ref 150–379)
RBC: 4.64 x10E6/uL (ref 3.77–5.28)
RDW: 13.1 % (ref 12.3–15.4)
WBC: 7.7 10*3/uL (ref 3.4–10.8)

## 2017-06-21 LAB — PROTIME-INR
INR: 1 (ref 0.8–1.2)
PROTHROMBIN TIME: 10.5 s (ref 9.1–12.0)

## 2017-06-24 ENCOUNTER — Telehealth: Payer: Self-pay | Admitting: *Deleted

## 2017-06-24 NOTE — Telephone Encounter (Signed)
Left detailed message with cath instructions for pt at home number [phone] (DPR).

## 2017-06-24 NOTE — Telephone Encounter (Signed)
I spoke with patient, confirmed and discussed cath instructions with patient, she verbalized understanding, thanked me for the call.

## 2017-06-24 NOTE — Telephone Encounter (Signed)
LMTCB to discuss cardiac cath instructions:  Cardiac Cath scheduled for: Thursday February 7,2019 10:30 AM [facility] Main Entrance A/[facility] at: 8 AM   HOLD medications: none  AM meds can be  taken pre-cath with sip of water including: ASA 81 mg am of procedure   Nothing to eat or drink after midnight. Patient has responsible person to drive home post procedure and observe patient for 24 hours:

## 2017-06-26 ENCOUNTER — Ambulatory Visit (HOSPITAL_COMMUNITY)
Admission: RE | Admit: 2017-06-26 | Discharge: 2017-06-26 | Disposition: A | Discharge status: Home / Self Care | Source: Ambulatory Visit | Attending: Interventional Cardiology | Admitting: Interventional Cardiology

## 2017-06-26 ENCOUNTER — Encounter (HOSPITAL_COMMUNITY)
Admission: RE | Disposition: A | Discharge status: Home / Self Care | Payer: Self-pay | Source: Ambulatory Visit | Attending: Interventional Cardiology

## 2017-06-26 DIAGNOSIS — Z7982 Long term (current) use of aspirin: Secondary | ICD-10-CM | POA: Diagnosis not present

## 2017-06-26 DIAGNOSIS — Z8249 Family history of ischemic heart disease and other diseases of the circulatory system: Secondary | ICD-10-CM | POA: Diagnosis not present

## 2017-06-26 DIAGNOSIS — R0789 Other chest pain: Secondary | ICD-10-CM

## 2017-06-26 DIAGNOSIS — I10 Essential (primary) hypertension: Secondary | ICD-10-CM | POA: Insufficient documentation

## 2017-06-26 DIAGNOSIS — K589 Irritable bowel syndrome without diarrhea: Secondary | ICD-10-CM | POA: Insufficient documentation

## 2017-06-26 DIAGNOSIS — E785 Hyperlipidemia, unspecified: Secondary | ICD-10-CM | POA: Diagnosis not present

## 2017-06-26 DIAGNOSIS — K219 Gastro-esophageal reflux disease without esophagitis: Secondary | ICD-10-CM | POA: Insufficient documentation

## 2017-06-26 DIAGNOSIS — I209 Angina pectoris, unspecified: Secondary | ICD-10-CM | POA: Diagnosis present

## 2017-06-26 DIAGNOSIS — E039 Hypothyroidism, unspecified: Secondary | ICD-10-CM | POA: Diagnosis not present

## 2017-06-26 DIAGNOSIS — J4530 Mild persistent asthma, uncomplicated: Secondary | ICD-10-CM | POA: Insufficient documentation

## 2017-06-26 DIAGNOSIS — I2510 Atherosclerotic heart disease of native coronary artery without angina pectoris: Secondary | ICD-10-CM | POA: Diagnosis not present

## 2017-06-26 DIAGNOSIS — Z880 Allergy status to penicillin: Secondary | ICD-10-CM | POA: Diagnosis not present

## 2017-06-26 DIAGNOSIS — Z7951 Long term (current) use of inhaled steroids: Secondary | ICD-10-CM | POA: Insufficient documentation

## 2017-06-26 DIAGNOSIS — R079 Chest pain, unspecified: Secondary | ICD-10-CM

## 2017-06-26 HISTORY — PX: LEFT HEART CATH AND CORONARY ANGIOGRAPHY: CATH118249

## 2017-06-26 SURGERY — LEFT HEART CATH AND CORONARY ANGIOGRAPHY
Anesthesia: LOCAL

## 2017-06-26 MED ORDER — MIDAZOLAM HCL 2 MG/2ML IJ SOLN
INTRAMUSCULAR | Status: DC | PRN
  Administered 2017-06-26 (×2): 1 mg via INTRAVENOUS

## 2017-06-26 MED ORDER — HEPARIN SODIUM (PORCINE) 1000 UNIT/ML IJ SOLN
INTRAMUSCULAR | Status: DC | PRN
  Administered 2017-06-26: 5000 [IU] via INTRAVENOUS

## 2017-06-26 MED ORDER — SODIUM CHLORIDE 0.9% FLUSH: 3.0000 mL | INTRAVENOUS | Status: DC | PRN

## 2017-06-26 MED ORDER — SODIUM CHLORIDE 0.9 % IV SOLN: 250.0000 mL | INTRAVENOUS | Status: DC | PRN

## 2017-06-26 MED ORDER — SODIUM CHLORIDE 0.9 % WEIGHT BASED INFUSION: 1.0000 mL/kg/h | INTRAVENOUS | Status: DC

## 2017-06-26 MED ORDER — SODIUM CHLORIDE 0.9% FLUSH: 3.0000 mL | Freq: Two times a day (BID) | INTRAVENOUS | Status: DC

## 2017-06-26 MED ORDER — IOHEXOL 350 MG/ML SOLN
INTRAVENOUS | Status: DC | PRN
  Administered 2017-06-26: 75 mL via INTRAVENOUS

## 2017-06-26 MED ORDER — VERAPAMIL HCL 2.5 MG/ML IV SOLN
INTRAVENOUS | Status: AC
  Filled 2017-06-26: qty 2

## 2017-06-26 MED ORDER — ASPIRIN 81 MG PO CHEW: 81.0000 mg | CHEWABLE_TABLET | ORAL | Status: DC

## 2017-06-26 MED ORDER — HEPARIN (PORCINE) IN NACL 2-0.9 UNIT/ML-% IJ SOLN
INTRAMUSCULAR | Status: AC | PRN
  Administered 2017-06-26: 1000 mL via INTRA_ARTERIAL

## 2017-06-26 MED ORDER — MIDAZOLAM HCL 2 MG/2ML IJ SOLN
INTRAMUSCULAR | Status: AC
  Filled 2017-06-26: qty 2

## 2017-06-26 MED ORDER — SODIUM CHLORIDE 0.9 % WEIGHT BASED INFUSION
3.0000 mL/kg/h | INTRAVENOUS | Status: AC
  Administered 2017-06-26: 3 mL/kg/h via INTRAVENOUS

## 2017-06-26 MED ORDER — LIDOCAINE HCL 1 % IJ SOLN
INTRAMUSCULAR | Status: AC
  Filled 2017-06-26: qty 20

## 2017-06-26 MED ORDER — SODIUM CHLORIDE 0.9 % IV SOLN: INTRAVENOUS | Status: DC

## 2017-06-26 MED ORDER — ACETAMINOPHEN 325 MG PO TABS: 650.0000 mg | ORAL_TABLET | ORAL | Status: DC | PRN

## 2017-06-26 MED ORDER — VERAPAMIL HCL 2.5 MG/ML IV SOLN
INTRAVENOUS | Status: DC | PRN
  Administered 2017-06-26: 10 mL via INTRA_ARTERIAL

## 2017-06-26 MED ORDER — ONDANSETRON HCL 4 MG/2ML IJ SOLN: 4.0000 mg | Freq: Four times a day (QID) | INTRAMUSCULAR | Status: DC | PRN

## 2017-06-26 MED ORDER — FENTANYL CITRATE (PF) 100 MCG/2ML IJ SOLN
INTRAMUSCULAR | Status: AC
  Filled 2017-06-26: qty 2

## 2017-06-26 MED ORDER — FENTANYL CITRATE (PF) 100 MCG/2ML IJ SOLN
INTRAMUSCULAR | Status: DC | PRN
  Administered 2017-06-26: 50 ug via INTRAVENOUS

## 2017-06-26 MED ORDER — LIDOCAINE HCL (PF) 1 % IJ SOLN
INTRAMUSCULAR | Status: DC | PRN
  Administered 2017-06-26: 2 mL via INTRADERMAL

## 2017-06-26 MED ORDER — HEPARIN (PORCINE) IN NACL 2-0.9 UNIT/ML-% IJ SOLN
INTRAMUSCULAR | Status: AC
  Filled 2017-06-26: qty 1000

## 2017-06-26 SURGICAL SUPPLY — 12 items
CATH 5FR JL3.5 JR4 ANG PIG MP (CATHETERS) ×1 IMPLANT
COVER PRB 48X5XTLSCP FOLD TPE (BAG) IMPLANT
COVER PROBE 5X48 (BAG) ×2
DEVICE RAD COMP TR BAND LRG (VASCULAR PRODUCTS) ×1 IMPLANT
GLIDESHEATH SLEND A-KIT 6F 22G (SHEATH) ×1 IMPLANT
GLIDESHEATH SLEND SS 6F .021 (SHEATH) IMPLANT
GUIDEWIRE INQWIRE 1.5J.035X260 (WIRE) IMPLANT
INQWIRE 1.5J .035X260CM (WIRE) ×2
KIT HEART LEFT (KITS) ×2 IMPLANT
PACK CARDIAC CATHETERIZATION (CUSTOM PROCEDURE TRAY) ×2 IMPLANT
TRANSDUCER W/STOPCOCK (MISCELLANEOUS) ×2 IMPLANT
TUBING CIL FLEX 10 FLL-RA (TUBING) ×2 IMPLANT

## 2017-06-26 NOTE — Interval H&P Note (Signed)
Cath Lab Visit (complete for each Cath Lab visit)  Clinical Evaluation Leading to the Procedure:   ACS: No.  Non-ACS:    Anginal Classification: CCS II  Anti-ischemic medical therapy: No Therapy  Non-Invasive Test Results: Intermediate-risk stress test findings: cardiac mortality 1-3%/year  Prior CABG: No previous CABG      History and Physical Interval Note:  06/26/2017 11:40 AM  Dominique Mooney  has presented today for surgery, with the diagnosis of abn cardiac ct  The various methods of treatment have been discussed with the patient and family. After consideration of risks, benefits and other options for treatment, the patient has consented to  Procedure(s): LEFT HEART CATH AND CORONARY ANGIOGRAPHY (N/A) as a surgical intervention .  The patient's history has been reviewed, patient examined, no change in status, stable for surgery.  I have reviewed the patient's chart and labs.  Questions were answered to the patient's satisfaction.     Belva Crome III

## 2017-06-26 NOTE — Research (Signed)
CAD FEM Informed Consent   Subject Name: Dominique Mooney  Subject met inclusion and exclusion criteria.  The informed consent form, study requirements and expectations were reviewed with the subject and questions and concerns were addressed prior to the signing of the consent form.  The subject verbalized understanding of the trail requirements.  The subject agreed to participate in the CAD FEM trial and signed the informed consent.  The informed consent was obtained prior to performance of any protocol-specific procedures for the subject.  A copy of the signed informed consent was given to the subject and a copy was placed in the subject's medical record.  Hedrick,Alleyne Lac W 06/26/2017, [id number]

## 2017-06-26 NOTE — Discharge Instructions (Signed)

## 2017-06-27 ENCOUNTER — Encounter (HOSPITAL_COMMUNITY): Payer: Self-pay | Admitting: Interventional Cardiology

## 2017-06-27 MED FILL — Lidocaine HCl Local Inj 1%: INTRAMUSCULAR | Qty: 20 | Status: AC

## 2017-06-27 MED FILL — Heparin Sodium (Porcine) 2 Unit/ML in Sodium Chloride 0.9%: INTRAMUSCULAR | Qty: 1000 | Status: AC

## 2017-07-18 ENCOUNTER — Ambulatory Visit (INDEPENDENT_AMBULATORY_CARE_PROVIDER_SITE_OTHER): Admitting: Cardiology

## 2017-07-18 ENCOUNTER — Encounter: Payer: Self-pay | Admitting: Cardiology

## 2017-07-18 VITALS — BP 132/86 | HR 72 | Ht 61.0 in | Wt 202.4 lb

## 2017-07-18 DIAGNOSIS — E785 Hyperlipidemia, unspecified: Secondary | ICD-10-CM

## 2017-07-18 DIAGNOSIS — I2510 Atherosclerotic heart disease of native coronary artery without angina pectoris: Secondary | ICD-10-CM

## 2017-07-18 NOTE — Progress Notes (Signed)
Cardiology Office Note:    Date:  07/18/2017   ID:  Dominique Mooney, DOB 1950/09/17, MRN [id number]  PCP:  Fanny Skates, MD (Inactive)  Cardiologist:  Candee Furbish, MD    Referring MD: No ref. provider found     History of Present Illness:    Dominique Mooney is a 67 y.o. female here for the evaluation of chest pain at the request of Dr. Halford Chessman.  She has a history of asthma.  During her last visit and pulmonary on 05/06/17 she described chest tightness in the morning hour described as a tightness/heaviness that resolved after a few minutes.  It was pressure-like.  No radiation, no nausea.  She thought it may have been her asthma.  Unsure.  She does have a very strong family history of coronary artery disease.  Back in 2013 she underwent a cardiac stress test which was negative.  EKG was performed at that office visit, personally reviewed, which showed T wave inversions in 3, F, V4 and V5. Lost 2 brothers to MI. Palpitations.   07/18/17-underwent cardiac catheterization, reassuring, no flow-limiting CAD.  No need for percutaneous intervention.  Excellent hemoglobin A1c of 5.7, LDL 105, we have increased her statin therapy to high intensity statin atorvastatin 40 mg, LDL was 105.  Our goal is going to be 70.  She did feel some gas-like discomfort, she drank a Coke and was able to belch and it went away.  No fevers chills nausea vomiting syncope bleeding.  Past Medical History:  Diagnosis Date  . Allergic rhinitis       . Asthma       . Chest pressure    a. Normal Stress Echo 05/2010  . GERD (gastroesophageal reflux disease)   . Hyperlipidemia   . Hypertension   . Hypothyroidism   . Irritable bowel syndrome   . Pancreatitis    after ERCP    Past Surgical History:  Procedure Laterality Date  . BRAVO [place] STUDY  05/02/2011   Procedure: BRAVO [place] STUDY;  Surgeon: Owens Loffler, MD;  Location: [facility];  Service: Endoscopy;  Laterality: N/A;  48 hour wirless pH test (Bravo Test)  .  CHOLECYSTECTOMY    . ESOPHAGOGASTRODUODENOSCOPY  05/02/2011   Procedure: ESOPHAGOGASTRODUODENOSCOPY (EGD);  Surgeon: Owens Loffler, MD;  Location: [facility];  Service: Endoscopy;  Laterality: N/A;  . HERNIA REPAIR    . LEFT HEART CATH AND CORONARY ANGIOGRAPHY N/A 06/26/2017   Procedure: LEFT HEART CATH AND CORONARY ANGIOGRAPHY;  Surgeon: Belva Crome, MD;  Location: [facility];  Service: Cardiovascular;  Laterality: N/A;  . MENISCUS REPAIR Left 03/18/2016   and repair of cracked bone  . SALPINGOOPHORECTOMY    . TOTAL ABDOMINAL HYSTERECTOMY      Current Medications: Current Meds  Medication Sig  . acetaminophen (TYLENOL) 500 MG tablet Take 500 mg by mouth every 6 (six) hours as needed for mild pain.  Marland Kitchen aspirin 81 MG tablet Take 81 mg by mouth daily.  Marland Kitchen atorvastatin (LIPITOR) 40 MG tablet Take 1 tablet (40 mg total) by mouth daily.  . budesonide-formoterol (SYMBICORT) 80-4.5 MCG/ACT inhaler Inhale 2 puffs into the lungs 2 (two) times daily.  Marland Kitchen esomeprazole (NEXIUM) 40 MG capsule Take 1 capsule 30 min before breakfast.  . fish oil-omega-3 fatty acids 1000 MG capsule Take 1 g by mouth daily.   . fluticasone (FLONASE) 50 MCG/ACT nasal spray Place 2 sprays into both nostrils daily. (Patient taking differently: Place 1 spray into both nostrils  daily as needed for allergies. )  . indomethacin (INDOCIN) 25 MG capsule Take 25 mg by mouth daily as needed (headaches).   Marland Kitchen levothyroxine (SYNTHROID, LEVOTHROID) 100 MCG tablet Take 100 mcg by mouth daily before breakfast.  . metoprolol succinate (TOPROL-XL) 25 MG 24 hr tablet Take 1 tablet (25 mg total) by mouth at bedtime.  . Multiple Vitamin (MULITIVITAMIN WITH MINERALS) TABS Take 1 tablet by mouth daily.   Marland Kitchen PROVENTIL HFA 108 (90 BASE) MCG/ACT inhaler INHALE 2 PUFFS INTO THE LUNGS EVERY 6 (SIX) HOURS AS NEEDED. (Patient taking differently: INHALE 2 PUFFS INTO THE LUNGS EVERY 6 (SIX) HOURS AS NEEDED FOR SHORTNESS OF BREATH)  . ranitidine  (ZANTAC) 150 MG capsule Take 1 capsule (150 mg total) by mouth every evening.     Allergies:   Penicillins   Social History   Socioeconomic History  . Marital status: Married    Spouse name: None  . Number of children: 2  . Years of education: None  . Highest education level: None  Social Needs  . Financial resource strain: None  . Food insecurity - worry: None  . Food insecurity - inability: None  . Transportation needs - medical: None  . Transportation needs - non-medical: None  Occupational History  . Occupation: retired    Fish farm manager: [employer]  Tobacco Use  . Smoking status: Never Smoker  . Smokeless tobacco: Never Used  Substance and Sexual Activity  . Alcohol use: No  . Drug use: No  . Sexual activity: None  Other Topics Concern  . 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   Currently retired as of 07/2012      [place] [facility] (11/26/16):   Originally from [place]. Has always lived in [place]. She retired from [employer]. 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 No pets currently. No bird exposure. No mold or hot tub exposure. Does have carpet in her bedroom. No feather bedding. She has blinds & draperies. No indoor plants. Enjoys helping out in her church and with nonprofit work. Enjoys going to the beach.      Family History: The patient's family history includes Allergies in her brother, sister, and son; Heart attack in her brother and brother; Stroke in her father and mother. There is no history of Colon cancer, Stomach cancer, Esophageal cancer, Lung disease, or Cancer.   ROS:   Please see the history of present illness.    All other review of systems negative  EKGs/Labs/Other Studies Reviewed:    The following studies were reviewed today: Prior office notes, EKG, lab work.  Cardiac catheterization reviewed.  EKG:  As above  Recent Labs: 02/21/2017: ALT 7 06/20/2017: BUN 12;  Creatinine, Ser 0.91; Hemoglobin 14.0; Platelets 356; Potassium 4.3; Sodium 140  Recent Lipid Panel No results found for: CHOL, TRIG, HDL, CHOLHDL, VLDL, LDLCALC, LDLDIRECT  Physical Exam:    VS:  BP 132/86   Pulse 72   Ht 5\' 1"  (1.549 m)   Wt 202 lb 6.4 oz (91.8 kg)   SpO2 98%   BMI 38.24 kg/m     Wt Readings from Last 3 Encounters:  07/18/17 202 lb 6.4 oz (91.8 kg)  06/26/17 202 lb (91.6 kg)  06/20/17 202 lb 12.8 oz (92 kg)     GEN: Well nourished, well developed, in no acute distress obese HEENT: normal  Neck: no JVD, carotid bruits, or masses  Cardiac: RRR; no murmurs, rubs, or gallops,no edema  Respiratory:  clear to auscultation bilaterally, normal work of breathing GI: soft, nontender, nondistended, + BS MS: no deformity or atrophy  Skin: warm and dry, no rash Neuro:  Alert and Oriented x 3, Strength and sensation are intact Psych: euthymic mood, full affect   ASSESSMENT:    1. Hyperlipidemia, unspecified hyperlipidemia type   2. Coronary artery disease involving native coronary artery of native heart without angina pectoris   3. Morbid obesity ([place])    PLAN:    In order of problems listed above:  Coronary artery disease -Coronary CT scan results reviewed.  FFR demonstrated 0.74 in the distal LAD distribution.  The proximal and mid region did not show any evidence of flow limitation.  We will go ahead and proceed with angiogram.  Risks and benefits including stroke, heart attack, death, renal impairment, bleeding have been discussed.  She is willing to proceed.  She is quite anxious given her family history of CAD.  We will also start her on metoprolol 25 mg once a day extended release and change her statin to atorvastatin.  I spent a lengthy amount of time discussing prevention.  Her EF is normal.  She was a bit tearful at times about this diagnosis given her family history.  Cath 06/26/17:  First diagonal, 40% obstruction in the ostium and mid vessel.  LAD wraps  around the left ventricular apex and contains diffuse 30% narrowing in the distal segment.  Circumflex is normal.  Right dominant anatomy with normal RCA  Normal left main  Normal left ventricular function and hemodynamics with EF 65% and EDP 9 mmHg  RECOMMENDATIONS:   No significant obstructive disease is noted.  Suggest aggressive risk factor modification to improve endothelial function and decrease the risk of progression.  Guideline directed targets would be LDL less than 70, A1c less than 7, tight blood pressure control, aerobic activity, and weight loss.  Would also be appropriate to determine if there is clinical suspicion of sleep apnea.   Her brother has had valve surgery in the past.  No changes. Mild persistent asthma -Symbicort.  Non-smoker.  Developed asthma later in her life.  Hyperlipidemia -We will go ahead and transition her from pravastatin over to atorvastatin 40 mg, high intensity statin given her coronary artery disease.  I will repeat lipid profile in 2 months.  Recheck ALT at that time as well.  Her goal LDL will be 70.  Morbid obesity-continue work on weight loss.  Continue with aggressive prevention efforts, weight loss.  Cardiac diet has been given to her.  She has an elliptical machine.  We will see her back in a little over a year.  Medication Adjustments/Labs and Tests Ordered: Current medicines are reviewed at length with the patient today.  Concerns regarding medicines are outlined above.  Orders Placed This Encounter  Procedures  . Lipid panel  . Hepatic function panel   No orders of the defined types were placed in this encounter.   Signed, Candee Furbish, MD  07/18/2017 9:06 AM    [facility]

## 2017-07-18 NOTE — Patient Instructions (Addendum)
Medication Instructions:  Your physician recommends that you continue on your current medications as directed. Please refer to the Current Medication list given to you today.   Labwork: Your physician recommends that you return for a FASTING lipid profile and liver function panel on 09/17/17  Testing/Procedures: None ordered  Follow-Up: Your physician wants you to follow-up in: 1 1/2 years with Dr. Dawna Part will receive a reminder letter in the mail two months in advance. If you don't receive a letter, please call our office to schedule the follow-up appointment.   Any Other Special Instructions Will Be Listed Below (If Applicable).   Heart-Healthy Eating Plan Heart-healthy meal planning includes:  Limiting unhealthy fats.  Increasing healthy fats.  Making other small dietary changes.  You may need to talk with your doctor or a diet specialist (dietitian) to create an eating plan that is right for you. What types of fat should I choose?  Choose healthy fats. These include olive oil and canola oil, flaxseeds, walnuts, almonds, and seeds.  Eat more omega-3 fats. These include salmon, mackerel, sardines, tuna, flaxseed oil, and ground flaxseeds. Try to eat fish at least twice each week.  Limit saturated fats. ? Saturated fats are often found in animal products, such as meats, butter, and cream. ? Plant sources of saturated fats include palm oil, palm kernel oil, and coconut oil.  Avoid foods with partially hydrogenated oils in them. These include stick margarine, some tub margarines, cookies, crackers, and other baked goods. These contain trans fats. What general guidelines do I need to follow?  Check food labels carefully. Identify foods with trans fats or high amounts of saturated fat.  Fill one half of your plate with vegetables and green salads. Eat 4-5 servings of vegetables per day. A serving of vegetables is: ? 1 cup of raw leafy vegetables. ?  cup of raw or cooked  cut-up vegetables. ?  cup of vegetable juice.  Fill one fourth of your plate with whole grains. Look for the word "whole" as the first word in the ingredient list.  Fill one fourth of your plate with lean protein foods.  Eat 4-5 servings of fruit per day. A serving of fruit is: ? One medium whole fruit. ?  cup of dried fruit. ?  cup of fresh, frozen, or canned fruit. ?  cup of 100% fruit juice.  Eat more foods that contain soluble fiber. These include apples, broccoli, carrots, beans, peas, and barley. Try to get 20-30 g of fiber per day.  Eat more home-cooked food. Eat less restaurant, buffet, and fast food.  Limit or avoid alcohol.  Limit foods high in starch and sugar.  Avoid fried foods.  Avoid frying your food. Try baking, boiling, grilling, or broiling it instead. You can also reduce fat by: ? Removing the skin from poultry. ? Removing all visible fats from meats. ? Skimming the fat off of stews, soups, and gravies before serving them. ? Steaming vegetables in water or broth.  Lose weight if you are overweight.  Eat 4-5 servings of nuts, legumes, and seeds per week: ? One serving of dried beans or legumes equals  cup after being cooked. ? One serving of nuts equals 1 ounces. ? One serving of seeds equals  ounce or one tablespoon.  You may need to keep track of how much salt or sodium you eat. This is especially true if you have high blood pressure. Talk with your doctor or dietitian to get more information. What foods  can I eat? Grains Breads, including [place], [place], pita, wheat, raisin, rye, oatmeal, and [place]. Tortillas that are neither fried nor made with lard or trans fat. Low-fat rolls, including hotdog and hamburger buns and English muffins. Biscuits. Muffins. Waffles. Pancakes. Light popcorn. Whole-grain cereals. Flatbread. Melba toast. Pretzels. Breadsticks. Rusks. Low-fat snacks. Low-fat crackers, including oyster, saltine, matzo, graham, animal, and  rye. Rice and pasta, including brown rice and pastas that are made with whole wheat. Vegetables All vegetables. Fruits All fruits, but limit coconut. Meats and Other Protein Sources Lean, well-trimmed beef, veal, pork, and lamb. Chicken and [place] without skin. All fish and shellfish. Wild duck, rabbit, pheasant, and venison. Egg whites or low-cholesterol egg substitutes. Dried beans, peas, lentils, and tofu. Seeds and most nuts. Dairy Low-fat or nonfat cheeses, including ricotta, string, and mozzarella. Skim or 1% milk that is liquid, powdered, or evaporated. Buttermilk that is made with low-fat milk. Nonfat or low-fat yogurt. Beverages Mineral water. Diet carbonated beverages. Sweets and Desserts Sherbets and fruit ices. Honey, jam, marmalade, jelly, and syrups. Meringues and gelatins. Pure sugar candy, such as hard candy, jelly beans, gumdrops, mints, marshmallows, and small amounts of dark chocolate. [employer] Eat all sweets and desserts in moderation. Fats and Oils Nonhydrogenated (trans-free) margarines. Vegetable oils, including soybean, sesame, sunflower, olive, peanut, safflower, corn, canola, and cottonseed. Salad dressings or mayonnaise made with a vegetable oil. Limit added fats and oils that you use for cooking, baking, salads, and as spreads. Other Cocoa powder. Coffee and tea. All seasonings and condiments. The items listed above may not be a complete list of recommended foods or beverages. Contact your dietitian for more options. What foods are not recommended? Grains Breads that are made with saturated or trans fats, oils, or whole milk. Croissants. Butter rolls. Cheese breads. Sweet rolls. Donuts. Buttered popcorn. Chow mein noodles. High-fat crackers, such as cheese or butter crackers. Meats and Other Protein Sources Fatty meats, such as hotdogs, short ribs, sausage, spareribs, bacon, rib eye roast or steak, and mutton. High-fat deli meats, such as salami and bologna.  Caviar. Domestic duck and goose. Organ meats, such as kidney, liver, sweetbreads, and heart. Dairy Cream, sour cream, cream cheese, and creamed cottage cheese. Whole-milk cheeses, including blue (bleu), Monterey Jack, [place], [place], American, [place], Swiss, cheddar, [place], and [place]. Whole or 2% milk that is liquid, evaporated, or condensed. Whole buttermilk. Cream sauce or high-fat cheese sauce. Yogurt that is made from whole milk. Beverages Regular sodas and juice drinks with added sugar. Sweets and Desserts Frosting. Pudding. Cookies. Cakes other than angel food cake. Candy that has milk chocolate or [place] chocolate, hydrogenated fat, butter, coconut, or unknown ingredients. Buttered syrups. Full-fat ice cream or ice cream drinks. Fats and Oils Gravy that has suet, meat fat, or shortening. Cocoa butter, hydrogenated oils, palm oil, coconut oil, palm kernel oil. These can often be found in baked products, candy, fried foods, nondairy creamers, and whipped toppings. Solid fats and shortenings, including bacon fat, salt pork, lard, and butter. Nondairy cream substitutes, such as coffee creamers and sour cream substitutes. Salad dressings that are made of unknown oils, cheese, or sour cream. The items listed above may not be a complete list of foods and beverages to avoid. Contact your dietitian for more information. This information is not intended to replace advice given to you by your health care provider. Make sure you discuss any questions you have with your health care provider. Document Released: 11/05/2011 Document Revised: 10/12/2015 Document Reviewed: 10/28/2013 Elsevier  Interactive Patient Education  [employer].    If you need a refill on your cardiac medications before your next appointment, please call your pharmacy.

## 2017-08-27 ENCOUNTER — Encounter: Payer: Self-pay | Admitting: Adult Health

## 2017-08-27 ENCOUNTER — Ambulatory Visit (INDEPENDENT_AMBULATORY_CARE_PROVIDER_SITE_OTHER): Admitting: Pulmonary Disease

## 2017-08-27 ENCOUNTER — Ambulatory Visit (INDEPENDENT_AMBULATORY_CARE_PROVIDER_SITE_OTHER): Admitting: Adult Health

## 2017-08-27 DIAGNOSIS — J309 Allergic rhinitis, unspecified: Secondary | ICD-10-CM

## 2017-08-27 DIAGNOSIS — J4530 Mild persistent asthma, uncomplicated: Secondary | ICD-10-CM

## 2017-08-27 LAB — PULMONARY FUNCTION TEST
DL/VA % pred: 113 %
DL/VA: 5.22 ml/min/mmHg/L
DLCO UNC % PRED: 98 %
DLCO UNC: 21.92 ml/min/mmHg
DLCO cor % pred: 96 %
DLCO cor: 21.54 ml/min/mmHg
FEF 25-75 PRE: 2.74 L/s
FEF 25-75 Post: 3.1 L/sec
FEF2575-%Change-Post: 12 %
FEF2575-%PRED-POST: 172 %
FEF2575-%Pred-Pre: 153 %
FEV1-%CHANGE-POST: 1 %
FEV1-%PRED-POST: 122 %
FEV1-%PRED-PRE: 121 %
FEV1-PRE: 2.23 L
FEV1-Post: 2.26 L
FEV1FVC-%Change-Post: 2 %
FEV1FVC-%PRED-PRE: 108 %
FEV6-%Change-Post: -1 %
FEV6-%PRED-PRE: 115 %
FEV6-%Pred-Post: 114 %
FEV6-POST: 2.59 L
FEV6-Pre: 2.63 L
FEV6FVC-%PRED-POST: 104 %
FEV6FVC-%PRED-PRE: 104 %
FVC-%CHANGE-POST: -1 %
FVC-%PRED-PRE: 110 %
FVC-%Pred-Post: 109 %
FVC-POST: 2.59 L
FVC-PRE: 2.63 L
POST FEV6/FVC RATIO: 100 %
PRE FEV6/FVC RATIO: 100 %
Post FEV1/FVC ratio: 87 %
Pre FEV1/FVC ratio: 85 %
RV % PRED: 109 %
RV: 2.19 L
TLC % PRED: 106 %
TLC: 5.14 L

## 2017-08-27 NOTE — Assessment & Plan Note (Signed)
Doing very well on current regimen  PFT with normal lung fxn   Plan  Patient Instructions  Continue on Symbicort 2 puffs Twice daily  , rinse after use.  Continue on Flonase 2 puff daily .  Continue on Zyrtec 10mg  At bedtime   Follow up with Dr. Halford Chessman  In 6 months and As needed

## 2017-08-27 NOTE — Assessment & Plan Note (Signed)
Controlled on current regimen.   

## 2017-08-27 NOTE — Progress Notes (Signed)
Reviewed and agree with assessment/plan.   Serin Thornell, MD [facility] 05/15/2016, 12:24 PM Pager:  [phone]  

## 2017-08-27 NOTE — Patient Instructions (Addendum)
Continue on Symbicort 2 puffs Twice daily  , rinse after use.  Continue on Flonase 2 puff daily .  Continue on Zyrtec 10mg  At bedtime   Follow up with Dr. Halford Chessman  In 6 months and As needed

## 2017-08-27 NOTE — Progress Notes (Signed)
@Patient  ID: Dominique Mooney, female    DOB: 27-Mar-1951, 67 y.o.   MRN: [id number]  Chief Complaint  Patient presents with  . Follow-up    Asthma     Referring provider: No ref. provider found  HPI: 67 yo female followed for mild persistent asthma, chronic allergic rhinitis and GERD  TEST  07/23/11: FVC 2.80 L (97%) FEV1 2.20 L (103%) FEV1/FVC 0.79 FEF 25-75 2.26 L (89%) negative bronchodilator response TLC 4.61 L (102%) RV 109% ERV 53% DLCO uncorrected 82%  CTA CHEST 02/01/13 (previouslyreviewed by me):No parenchymal nodule or opacity appreciated. Dependent lower lobe atelectasis noted. No pleural effusion or thickening. No pericardial effusion. No pathologic mediastinal adenopathy. No pulmonary embolism or aortic dissection.  LABS 11/26/16 IgE: 174 RAST panel: Cockroach 0.14, D pternoyssinus 0.33,&D farinae 0.28  08/27/2017 Follow up: Asthma  Patient returns for a 37-month follow-up.. Says overall her asthma is doing okay.  She denies any increased flare of cough or wheezing.  Does notice that she has more allergies symptoms with the high pollen outside.  With nasal drainage.  Patient underwent PFT today that shows normal lung function with FEV1 at 122%, ratio 87, FVC 109%, DLCO 98%.  No significant bronchodilator response. (took Symbicort last night ) .  She remains on Symbicort 80 twice daily. Taking Flonase and Zyrtec which helps with nasal sx.    Is exercising , eating healthier . Losing weight. No wheezing or increased SABA use.    Allergies  Allergen Reactions  . Penicillins Rash    Immunization History  Administered Date(s) Administered  . Influenza Split 01/19/2011, 07/21/2013, 02/27/2016  . Influenza Whole 01/18/2010, 01/19/2012  . Influenza,inj,Quad PF,6+ Mos 02/26/2017  . PPD Test 10/15/2014  . Pneumococcal Polysaccharide-23 03/03/2012    Past Medical History:  Diagnosis Date  . Allergic rhinitis       . Asthma       . Chest pressure    a. Normal  Stress Echo 05/2010  . GERD (gastroesophageal reflux disease)   . Hyperlipidemia   . Hypertension   . Hypothyroidism   . Irritable bowel syndrome   . Pancreatitis    after ERCP    Tobacco History: Social History   Tobacco Use  Smoking Status Never Smoker  Smokeless Tobacco Never Used   Counseling given: Not Answered   Outpatient Encounter Medications as of 08/27/2017  Medication Sig  . acetaminophen (TYLENOL) 500 MG tablet Take 500 mg by mouth every 6 (six) hours as needed for mild pain.  Marland Kitchen aspirin 81 MG tablet Take 81 mg by mouth daily.  Marland Kitchen atorvastatin (LIPITOR) 40 MG tablet Take 1 tablet (40 mg total) by mouth daily.  . budesonide-formoterol (SYMBICORT) 80-4.5 MCG/ACT inhaler Inhale 2 puffs into the lungs 2 (two) times daily.  . cetirizine (ZYRTEC) 10 MG tablet Take 10 mg by mouth daily.  Marland Kitchen esomeprazole (NEXIUM) 40 MG capsule Take 1 capsule 30 min before breakfast.  . fish oil-omega-3 fatty acids 1000 MG capsule Take 1 g by mouth daily.   . fluticasone (FLONASE) 50 MCG/ACT nasal spray Place 2 sprays into both nostrils daily. (Patient taking differently: Place 1 spray into both nostrils daily as needed for allergies. )  . indomethacin (INDOCIN) 25 MG capsule Take 25 mg by mouth daily as needed (headaches).   Marland Kitchen levothyroxine (SYNTHROID, LEVOTHROID) 100 MCG tablet Take 100 mcg by mouth daily before breakfast.  . metoprolol succinate (TOPROL-XL) 25 MG 24 hr tablet Take 1 tablet (25 mg total) by mouth  at bedtime.  . Multiple Vitamin (MULITIVITAMIN WITH MINERALS) TABS Take 1 tablet by mouth daily.   Marland Kitchen PROVENTIL HFA 108 (90 BASE) MCG/ACT inhaler INHALE 2 PUFFS INTO THE LUNGS EVERY 6 (SIX) HOURS AS NEEDED. (Patient taking differently: INHALE 2 PUFFS INTO THE LUNGS EVERY 6 (SIX) HOURS AS NEEDED FOR SHORTNESS OF BREATH)  . ranitidine (ZANTAC) 150 MG capsule Take 1 capsule (150 mg total) by mouth every evening.   No facility-administered encounter medications on file as of 08/27/2017.       Review of Systems  Constitutional:   No  weight loss, night sweats,  Fevers, chills, fatigue, or  lassitude.  HEENT:   No headaches,  Difficulty swallowing,  Tooth/dental problems, or  Sore throat,                No sneezing, itching, ear ache,  +nasal congestion, post nasal drip,   CV:  No chest pain,  Orthopnea, PND, swelling in lower extremities, anasarca, dizziness, palpitations, syncope.   GI  No heartburn, indigestion, abdominal pain, nausea, vomiting, diarrhea, change in bowel habits, loss of appetite, bloody stools.   Resp: No shortness of breath with exertion or at rest.  No excess mucus, no productive cough,  No non-productive cough,  No coughing up of blood.  No change in color of mucus.  No wheezing.  No chest wall deformity  Skin: no rash or lesions.  GU: no dysuria, change in color of urine, no urgency or frequency.  No flank pain, no hematuria   MS:  No joint pain or swelling.  No decreased range of motion.  No back pain.    Physical Exam  BP 118/80 (BP Location: Left Arm, Cuff Size: Normal)   Pulse 67   Ht 5' 2.5" (1.588 m)   Wt 195 lb (88.5 kg)   SpO2 97%   BMI 35.10 kg/m   GEN: A/Ox3; pleasant , NAD, well nourished    HEENT:  [place]/AT,  EACs-clear, TMs-wnl, NOSE-clear, THROAT-clear, no lesions, no postnasal drip or exudate noted.   NECK:  Supple w/ fair ROM; no JVD; normal carotid impulses w/o bruits; no thyromegaly or nodules palpated; no lymphadenopathy.    RESP  Clear  P & A; w/o, wheezes/ rales/ or rhonchi. no accessory muscle use, no dullness to percussion  CARD:  RRR, no m/r/g, no peripheral edema, pulses intact, no cyanosis or clubbing.  GI:   Soft & nt; nml bowel sounds; no organomegaly or masses detected.   Musco: Warm bil, no deformities or joint swelling noted.   Neuro: alert, no focal deficits noted.    Skin: Warm, no lesions or rashes    Lab Results:  CBC  BMET  Imaging: No results found.   Assessment & Plan:   Mild  persistent asthma Doing very well on current regimen  PFT with normal lung fxn   Plan  Patient Instructions  Continue on Symbicort 2 puffs Twice daily  , rinse after use.  Continue on Flonase 2 puff daily .  Continue on Zyrtec 10mg  At bedtime   Follow up with Dr. Halford Chessman  In 6 months and As needed         Allergic rhinitis Controlled on current regimen      Rexene Edison, NP 08/27/2017

## 2017-08-27 NOTE — Progress Notes (Signed)
PFT done today. 

## 2017-09-17 ENCOUNTER — Other Ambulatory Visit

## 2017-09-25 ENCOUNTER — Telehealth: Payer: Self-pay | Admitting: Adult Health

## 2017-09-25 MED ORDER — AZITHROMYCIN 250 MG PO TABS: ORAL_TABLET | ORAL | 0 refills | Status: AC

## 2017-09-25 NOTE — Telephone Encounter (Signed)
Pt is aware of below recommendations and voiced her understanding.  Rx for Zpak has been sent to preferred pharmacy. Nothing further is needed.

## 2017-09-25 NOTE — Telephone Encounter (Signed)
Zpack#1 take as directed.  Mucinex Twice daily  As needed   Saline nasal rinses As needed   Please contact office for sooner follow up if symptoms do not improve or worsen or seek emergency care

## 2017-09-25 NOTE — Telephone Encounter (Signed)
Spoke with pt. States that she is not feeling well. Reports sinus pressure, PND and jaw pain. Feels like she has a sinus infection. Denies chest tightness, wheezing, SOB, coughing or fever. Symptoms started 1 week ago. Pt would like to have something called in.  Tammy - please advise. Thanks.

## 2017-11-25 ENCOUNTER — Other Ambulatory Visit: Payer: Self-pay | Admitting: Obstetrics and Gynecology

## 2017-11-25 DIAGNOSIS — Z1231 Encounter for screening mammogram for malignant neoplasm of breast: Secondary | ICD-10-CM

## 2017-12-01 ENCOUNTER — Encounter (INDEPENDENT_AMBULATORY_CARE_PROVIDER_SITE_OTHER): Payer: Self-pay

## 2017-12-01 ENCOUNTER — Other Ambulatory Visit: Admitting: *Deleted

## 2017-12-01 DIAGNOSIS — E785 Hyperlipidemia, unspecified: Secondary | ICD-10-CM

## 2017-12-01 LAB — LIPID PANEL
Chol/HDL Ratio: 2.8 ratio (ref 0.0–4.4)
Cholesterol, Total: 129 mg/dL (ref 100–199)
HDL: 46 mg/dL (ref >39)
LDL CALC: 66 mg/dL (ref 0–99)
Triglycerides: 87 mg/dL (ref 0–149)
VLDL CHOLESTEROL CAL: 17 mg/dL (ref 5–40)

## 2017-12-01 LAB — HEPATIC FUNCTION PANEL
ALBUMIN: 3.9 g/dL (ref 3.6–4.8)
ALT: 6 IU/L (ref 0–32)
AST: 15 IU/L (ref 0–40)
Alkaline Phosphatase: 83 IU/L (ref 39–117)
BILIRUBIN, DIRECT: 0.18 mg/dL (ref 0.00–0.40)
Bilirubin Total: 0.6 mg/dL (ref 0.0–1.2)
TOTAL PROTEIN: 6.7 g/dL (ref 6.0–8.5)

## 2017-12-19 ENCOUNTER — Ambulatory Visit
Admission: RE | Admit: 2017-12-19 | Discharge: 2017-12-19 | Disposition: A | Discharge status: Home / Self Care | Source: Ambulatory Visit | Attending: Obstetrics and Gynecology | Admitting: Obstetrics and Gynecology

## 2017-12-19 DIAGNOSIS — Z1231 Encounter for screening mammogram for malignant neoplasm of breast: Secondary | ICD-10-CM

## 2018-01-29 DIAGNOSIS — M170 Bilateral primary osteoarthritis of knee: Secondary | ICD-10-CM | POA: Diagnosis not present

## 2018-01-29 DIAGNOSIS — M25562 Pain in left knee: Secondary | ICD-10-CM | POA: Diagnosis not present

## 2018-02-10 DIAGNOSIS — E119 Type 2 diabetes mellitus without complications: Secondary | ICD-10-CM | POA: Diagnosis not present

## 2018-02-10 DIAGNOSIS — E785 Hyperlipidemia, unspecified: Secondary | ICD-10-CM | POA: Diagnosis not present

## 2018-02-13 DIAGNOSIS — E785 Hyperlipidemia, unspecified: Secondary | ICD-10-CM | POA: Diagnosis not present

## 2018-02-13 DIAGNOSIS — E039 Hypothyroidism, unspecified: Secondary | ICD-10-CM | POA: Diagnosis not present

## 2018-02-13 DIAGNOSIS — I10 Essential (primary) hypertension: Secondary | ICD-10-CM | POA: Diagnosis not present

## 2018-02-13 DIAGNOSIS — Z23 Encounter for immunization: Secondary | ICD-10-CM | POA: Diagnosis not present

## 2018-02-13 DIAGNOSIS — J4520 Mild intermittent asthma, uncomplicated: Secondary | ICD-10-CM | POA: Diagnosis not present

## 2018-02-13 DIAGNOSIS — E559 Vitamin D deficiency, unspecified: Secondary | ICD-10-CM | POA: Diagnosis not present

## 2018-02-20 DIAGNOSIS — E039 Hypothyroidism, unspecified: Secondary | ICD-10-CM | POA: Diagnosis not present

## 2018-02-25 DIAGNOSIS — M170 Bilateral primary osteoarthritis of knee: Secondary | ICD-10-CM | POA: Diagnosis not present

## 2018-02-26 DIAGNOSIS — E039 Hypothyroidism, unspecified: Secondary | ICD-10-CM | POA: Diagnosis not present

## 2018-03-04 DIAGNOSIS — M170 Bilateral primary osteoarthritis of knee: Secondary | ICD-10-CM | POA: Diagnosis not present

## 2018-03-11 DIAGNOSIS — M170 Bilateral primary osteoarthritis of knee: Secondary | ICD-10-CM | POA: Diagnosis not present

## 2018-05-28 DIAGNOSIS — E039 Hypothyroidism, unspecified: Secondary | ICD-10-CM | POA: Diagnosis not present

## 2018-07-17 DIAGNOSIS — M1712 Unilateral primary osteoarthritis, left knee: Secondary | ICD-10-CM | POA: Diagnosis not present

## 2018-07-17 DIAGNOSIS — M1711 Unilateral primary osteoarthritis, right knee: Secondary | ICD-10-CM | POA: Diagnosis not present

## 2018-07-23 DIAGNOSIS — H26493 Other secondary cataract, bilateral: Secondary | ICD-10-CM | POA: Diagnosis not present

## 2018-07-23 DIAGNOSIS — H401411 Capsular glaucoma with pseudoexfoliation of lens, right eye, mild stage: Secondary | ICD-10-CM | POA: Diagnosis not present

## 2018-07-23 DIAGNOSIS — H3509 Other intraretinal microvascular abnormalities: Secondary | ICD-10-CM | POA: Diagnosis not present

## 2018-07-23 DIAGNOSIS — H40013 Open angle with borderline findings, low risk, bilateral: Secondary | ICD-10-CM | POA: Diagnosis not present

## 2018-08-17 DIAGNOSIS — Z131 Encounter for screening for diabetes mellitus: Secondary | ICD-10-CM | POA: Diagnosis not present

## 2018-08-17 DIAGNOSIS — Z0000 Encounter for general adult medical examination without abnormal findings: Secondary | ICD-10-CM | POA: Diagnosis not present

## 2018-08-17 DIAGNOSIS — E039 Hypothyroidism, unspecified: Secondary | ICD-10-CM | POA: Diagnosis not present

## 2018-08-17 DIAGNOSIS — E785 Hyperlipidemia, unspecified: Secondary | ICD-10-CM | POA: Diagnosis not present

## 2018-09-01 ENCOUNTER — Other Ambulatory Visit: Payer: Self-pay | Admitting: Cardiology

## 2018-09-01 MED ORDER — ATORVASTATIN CALCIUM 40 MG PO TABS: 40.0000 mg | ORAL_TABLET | Freq: Every day | ORAL | 1 refills | Status: DC

## 2018-09-01 NOTE — Telephone Encounter (Signed)
Pt's medication was sent to pt's pharmacy as requested. Confirmation received.  °

## 2018-09-10 DIAGNOSIS — E119 Type 2 diabetes mellitus without complications: Secondary | ICD-10-CM | POA: Diagnosis not present

## 2018-09-10 DIAGNOSIS — E785 Hyperlipidemia, unspecified: Secondary | ICD-10-CM | POA: Diagnosis not present

## 2018-09-10 DIAGNOSIS — E039 Hypothyroidism, unspecified: Secondary | ICD-10-CM | POA: Diagnosis not present

## 2018-09-10 DIAGNOSIS — Z0000 Encounter for general adult medical examination without abnormal findings: Secondary | ICD-10-CM | POA: Diagnosis not present

## 2018-10-20 ENCOUNTER — Encounter: Payer: Self-pay | Admitting: Adult Health

## 2018-10-20 ENCOUNTER — Other Ambulatory Visit: Payer: Self-pay

## 2018-10-20 ENCOUNTER — Ambulatory Visit (INDEPENDENT_AMBULATORY_CARE_PROVIDER_SITE_OTHER): Payer: Medicare Other | Admitting: Adult Health

## 2018-10-20 DIAGNOSIS — J309 Allergic rhinitis, unspecified: Secondary | ICD-10-CM | POA: Diagnosis not present

## 2018-10-20 DIAGNOSIS — J4530 Mild persistent asthma, uncomplicated: Secondary | ICD-10-CM

## 2018-10-20 MED ORDER — ALBUTEROL SULFATE HFA 108 (90 BASE) MCG/ACT IN AERS: INHALATION_SPRAY | RESPIRATORY_TRACT | 3 refills | Status: AC

## 2018-10-20 NOTE — Patient Instructions (Addendum)
Continue on Symbicort 2 puffs Twice daily  , rinse after use.  Continue on Flonase 2 puff daily .  Continue on Zyrtec 10mg  At bedtime   Saline nasal spray As needed   Follow up with Dr. Halford Chessman  In 1 year and As needed

## 2018-10-20 NOTE — Assessment & Plan Note (Signed)
Controlled on regimen  Add saline nasal spray   Plan  Patient Instructions  Continue on Symbicort 2 puffs Twice daily  , rinse after use.  Continue on Flonase 2 puff daily .  Continue on Zyrtec 10mg  At bedtime   Saline nasal spray As needed   Follow up with Dr. Halford Chessman  In 1 year and As needed

## 2018-10-20 NOTE — Progress Notes (Signed)
@Patient  ID: Dominique Mooney, female    DOB: 1951/03/15, 68 y.o.   MRN: [id number]  Chief Complaint  Patient presents with  . Follow-up    Asthma     Referring provider: No ref. provider found  HPI: 55 yofemale never smoker followed for mild persistent asthma, chronic allergic rhinitis and GERD   TEST/EVENTS :  07/23/11: FVC 2.80 L (97%) FEV1 2.20 L (103%) FEV1/FVC 0.79 FEF 25-75 2.26 L (89%) negative bronchodilator response TLC 4.61 L (102%) RV 109% ERV 53% DLCO uncorrected 82%  CTA CHEST 02/01/13 (previouslyreviewed by me):No parenchymal nodule or opacity appreciated. Dependent lower lobe atelectasis noted. No pleural effusion or thickening. No pericardial effusion. No pathologic mediastinal adenopathy. No pulmonary embolism or aortic dissection.  LABS 11/26/16 IgE: 174 RAST panel: Cockroach 0.14, D pternoyssinus 0.33,&D farinae 0.28  PFT 2019  that shows normal lung function with FEV1 at 122%, ratio 87, FVC 109%, DLCO 98%.  No significant bronchodilator response.  10/20/2018 Follow up : Asthma  Patient presents for a one-year follow-up.  She has underlying mild persistent asthma, chronic allergic rhinitis.  Pulmonary function testing last year showed normal lung function with no airflow obstruction or restriction.  She is on a maintenance regimen of Symbicort, Flonase, Zyrtec. Patient says overall breathing has been doing well with no flare of cough or wheezing . Feels asthma is under control with Rare albuterol use.  Staying active at home .  Does have post nasal drainage, zyrtec is helping .  No chest pain, orthopnea or edema.   Allergies  Allergen Reactions  . Penicillins Rash    Immunization History  Administered Date(s) Administered  . Influenza Split 01/19/2011, 07/21/2013, 02/27/2016  . Influenza Whole 01/18/2010, 01/19/2012  . Influenza,inj,Quad PF,6+ Mos 02/26/2017  . PPD Test 10/15/2014  . Pneumococcal Polysaccharide-23 03/03/2012    Past Medical  History:  Diagnosis Date  . Allergic rhinitis       . Asthma       . Chest pressure    a. Normal Stress Echo 05/2010  . GERD (gastroesophageal reflux disease)   . Hyperlipidemia   . Hypertension   . Hypothyroidism   . Irritable bowel syndrome   . Pancreatitis    after ERCP    Tobacco History: Social History   Tobacco Use  Smoking Status Never Smoker  Smokeless Tobacco Never Used   Counseling given: Not Answered   Outpatient Medications Prior to Visit  Medication Sig Dispense Refill  . acetaminophen (TYLENOL) 500 MG tablet Take 500 mg by mouth every 6 (six) hours as needed for mild pain.    Marland Kitchen aspirin 81 MG tablet Take 81 mg by mouth daily.    Marland Kitchen atorvastatin (LIPITOR) 40 MG tablet Take 1 tablet (40 mg total) by mouth daily. Please keep upcoming appt in August with Dr. Marlou Porch for future refills. Thank yu 90 tablet 1  . budesonide-formoterol (SYMBICORT) 80-4.5 MCG/ACT inhaler Inhale 2 puffs into the lungs 2 (two) times daily. 1 Inhaler 3  . cetirizine (ZYRTEC) 10 MG tablet Take 10 mg by mouth daily.    . fish oil-omega-3 fatty acids 1000 MG capsule Take 1 g by mouth daily.     . fluticasone (FLONASE) 50 MCG/ACT nasal spray Place 2 sprays into both nostrils daily. (Patient taking differently: Place 1 spray into both nostrils daily as needed for allergies. ) 16 g 2  . levothyroxine (SYNTHROID, LEVOTHROID) 100 MCG tablet Take 100 mcg by mouth daily before breakfast.    . Multiple  Vitamin (MULITIVITAMIN WITH MINERALS) TABS Take 1 tablet by mouth daily.     Marland Kitchen PROVENTIL HFA 108 (90 BASE) MCG/ACT inhaler INHALE 2 PUFFS INTO THE LUNGS EVERY 6 (SIX) HOURS AS NEEDED. (Patient taking differently: INHALE 2 PUFFS INTO THE LUNGS EVERY 6 (SIX) HOURS AS NEEDED FOR SHORTNESS OF BREATH) 1 Inhaler 3  . ranitidine (ZANTAC) 150 MG capsule Take 1 capsule (150 mg total) by mouth every evening. 30 capsule 11  . esomeprazole (NEXIUM) 40 MG capsule Take 1 capsule 30 min before breakfast. (Patient not  taking: Reported on 10/20/2018) 30 capsule 11  . indomethacin (INDOCIN) 25 MG capsule Take 25 mg by mouth daily as needed (headaches).     . metoprolol succinate (TOPROL-XL) 25 MG 24 hr tablet Take 1 tablet (25 mg total) by mouth at bedtime. (Patient not taking: Reported on 10/20/2018) 90 tablet 3   No facility-administered medications prior to visit.      Review of Systems:   Constitutional:   No  weight loss, night sweats,  Fevers, chills, fatigue, or  lassitude.  HEENT:   No headaches,  Difficulty swallowing,  Tooth/dental problems, or  Sore throat,                No sneezing, itching, ear ache,  +nasal congestion, post nasal drip,   CV:  No chest pain,  Orthopnea, PND, swelling in lower extremities, anasarca, dizziness, palpitations, syncope.   GI  No heartburn, indigestion, abdominal pain, nausea, vomiting, diarrhea, change in bowel habits, loss of appetite, bloody stools.   Resp: No shortness of breath with exertion or at rest.  No excess mucus, no productive cough,  No non-productive cough,  No coughing up of blood.  No change in color of mucus.  No wheezing.  No chest wall deformity  Skin: no rash or lesions.  GU: no dysuria, change in color of urine, no urgency or frequency.  No flank pain, no hematuria   MS:  No joint pain or swelling.  No decreased range of motion.  No back pain.    Physical Exam  BP 128/70 (BP Location: Left Arm)   Pulse 91   Temp 98.3 F (36.8 C) (Oral)   Ht 5\' 1"  (1.549 m)   Wt 178 lb 9.6 oz (81 kg)   SpO2 99%   BMI 33.75 kg/m   GEN: A/Ox3; pleasant , NAD, well nourished    HEENT:  [place]/AT,  EACs-clear, TMs-wnl, NOSE-clear, THROAT-clear, no lesions, no postnasal drip or exudate noted.   NECK:  Supple w/ fair ROM; no JVD; normal carotid impulses w/o bruits; no thyromegaly or nodules palpated; no lymphadenopathy.    RESP  Clear  P & A; w/o, wheezes/ rales/ or rhonchi. no accessory muscle use, no dullness to percussion  CARD:  RRR, no m/r/g, no  peripheral edema, pulses intact, no cyanosis or clubbing.  GI:   Soft & nt; nml bowel sounds; no organomegaly or masses detected.   Musco: Warm bil, no deformities or joint swelling noted.   Neuro: alert, no focal deficits noted.    Skin: Warm, no lesions or rashes    Lab Results:  CBC    Component Value Date/Time   WBC 7.7 06/20/2017 1026   WBC 8.1 02/21/2017 1034   RBC 4.64 06/20/2017 1026   RBC 4.39 02/21/2017 1034   HGB 14.0 06/20/2017 1026   HCT 39.7 06/20/2017 1026   PLT 356 06/20/2017 1026   MCV 86 06/20/2017 1026   MCH 30.2 06/20/2017 1026  MCH 30.2 01/27/2016 1150   MCH 29.7 02/01/2013 0936   MCHC 35.3 06/20/2017 1026   MCHC 33.3 02/21/2017 1034   RDW 13.1 06/20/2017 1026   LYMPHSABS 2.2 02/21/2017 1034   MONOABS 0.7 02/21/2017 1034   EOSABS 0.1 02/21/2017 1034   BASOSABS 0.1 02/21/2017 1034    BMET    Component Value Date/Time   NA 140 06/20/2017 1026   K 4.3 06/20/2017 1026   CL 100 06/20/2017 1026   CO2 22 06/20/2017 1026   GLUCOSE 94 06/20/2017 1026   GLUCOSE 101 (H) 02/21/2017 1034   BUN 12 06/20/2017 1026   CREATININE 0.91 06/20/2017 1026   CALCIUM 9.8 06/20/2017 1026   GFRNONAA 67 06/20/2017 1026   GFRAA 77 06/20/2017 1026    BNP No results found for: BNP  ProBNP    Component Value Date/Time   PROBNP 66.4 08/24/2011 1322    Imaging: No results found.    PFT Results Latest Ref Rng & Units 08/27/2017  FVC-Pre L 2.63  FVC-Predicted Pre % 110  FVC-Post L 2.59  FVC-Predicted Post % 109  Pre FEV1/FVC % % 85  Post FEV1/FCV % % 87  FEV1-Pre L 2.23  FEV1-Predicted Pre % 121  FEV1-Post L 2.26  DLCO UNC% % 98  DLCO COR %Predicted % 113  TLC L 5.14  TLC % Predicted % 106  RV % Predicted % 109    Lab Results  Component Value Date   NITRICOXIDE 17 05/06/2017        Assessment & Plan:   Mild persistent asthma Well controlled on current regimen   Plan  Patient Instructions  Continue on Symbicort 2 puffs Twice daily  ,  rinse after use.  Continue on Flonase 2 puff daily .  Continue on Zyrtec 10mg  At bedtime   Saline nasal spray As needed   Follow up with Dr. Halford Chessman  In 1 year and As needed         Allergic rhinitis Controlled on regimen  Add saline nasal spray   Plan  Patient Instructions  Continue on Symbicort 2 puffs Twice daily  , rinse after use.  Continue on Flonase 2 puff daily .  Continue on Zyrtec 10mg  At bedtime   Saline nasal spray As needed   Follow up with Dr. Halford Chessman  In 1 year and As needed            Rexene Edison, NP 10/20/2018

## 2018-10-20 NOTE — Assessment & Plan Note (Signed)
Well controlled on current regimen   Plan  Patient Instructions  Continue on Symbicort 2 puffs Twice daily  , rinse after use.  Continue on Flonase 2 puff daily .  Continue on Zyrtec 10mg  At bedtime   Saline nasal spray As needed   Follow up with Dr. Halford Chessman  In 1 year and As needed

## 2018-10-26 ENCOUNTER — Telehealth: Payer: Self-pay | Admitting: Cardiology

## 2018-10-26 NOTE — Telephone Encounter (Signed)
  Pt c/o BP issue: STAT if pt c/o blurred vision, one-sided weakness or slurred speech  1. What are your last 5 BP readings?  130/90, 128/60, 135/56, 132/60  2. Are you having any other symptoms (ex. Dizziness, headache, blurred vision, passed out)? Little headache yesterday  3. What is your BP issue? Patient is concerned about her blood pressure because she says it never runs this high.

## 2018-10-26 NOTE — Telephone Encounter (Signed)
Agree with plan. No changes Candee Furbish, MD

## 2018-10-26 NOTE — Telephone Encounter (Signed)
Called patient back about her message. Patient is concerned about her BP being elevated over the weekend. Patient stated her SBP got as high as 150. Patient stated she also had a headache at the time, that went away with tylenol. Informed patient that her previous BP listed in her message were not bad. Informed patient that we like to see patient's BP stay below 140/90. Patient's HR has been in the 50's and 60's and informed patient that this is fine since she is on Metoprolol 25 mg daily. Patient was wondering if she needed to take an extra metoprolol, informed patient that she should not due to her HR. Patient stated her BP was fine today, that she was concerned about it spiking over the weekend with the headache. Encouraged patient to keep a low salt diet, which she has been. Informed patient that her BP going up could be caused by stress and encouraged patient to try some relaxing techniques to help with stress. Will forward to Dr. Marlou Porch for further advisement.  Patient has an appointment with Dr. Marlou Porch in August.

## 2018-11-10 ENCOUNTER — Other Ambulatory Visit: Payer: Self-pay | Admitting: Obstetrics and Gynecology

## 2018-11-10 DIAGNOSIS — Z1231 Encounter for screening mammogram for malignant neoplasm of breast: Secondary | ICD-10-CM

## 2018-12-18 ENCOUNTER — Encounter: Payer: Self-pay | Admitting: Gastroenterology

## 2018-12-18 ENCOUNTER — Ambulatory Visit (INDEPENDENT_AMBULATORY_CARE_PROVIDER_SITE_OTHER): Payer: Medicare Other | Admitting: Gastroenterology

## 2018-12-18 ENCOUNTER — Encounter

## 2018-12-18 VITALS — Ht 61.0 in | Wt 170.0 lb

## 2018-12-18 DIAGNOSIS — R1310 Dysphagia, unspecified: Secondary | ICD-10-CM

## 2018-12-18 DIAGNOSIS — R194 Change in bowel habit: Secondary | ICD-10-CM | POA: Diagnosis not present

## 2018-12-18 MED ORDER — PEG 3350-KCL-NA BICARB-NACL 420 G PO SOLR: 4000.0000 mL | ORAL | 0 refills | Status: DC

## 2018-12-18 NOTE — Patient Instructions (Addendum)
We will arrange for a colonoscopy for change in bowel habits, lower abdominal pain and same time upper endoscopy for dysphasia, odynophagia.  Thank you for entrusting me with your care and choosing [facility].  Dr Ardis Hughs

## 2018-12-18 NOTE — Progress Notes (Signed)
Review of gastrointestinal problems:  1. Dysphasia, nausea 2009.EGD may 2009 was normal. Symptoms thought to be GERD related. Symptoms improved with addition of H2 blocker (March 2011). Barium esophagram 2011 was normal.  2. routine risk for colon cancer,colonoscopy may 2009: hemorrhoids only. Next colonoscopy may 2019; Intermittent IBS-like discomforts that are helped with antispasmodics usually. Repeated colonoscopy 07/2013 for some bowel habit changes, it was completely normal. Recommended recall colonoscopy for cancer screening 07/2023. 3. post ERCP pancreatitis, 2000  4. laparoscopic cholecystectomy,2000  5. GERD: EGD 12/12 with [employer] pH test 48 hours, done 05/02/11 while still on BID PPI (omeprazole) and bedtime H2 blocker was + for continued pathologic acid exposure. 2/13 felt much better on bid nexium than other PPI and nightly H2 blocker.   This service was provided via virtual visit.  Both audio and visual were used.  The patient was located at home.  I was located in my office.  The patient did consent to this virtual visit and is aware of possible charges through their insurance for this visit.  The patient is an established patient.  My certified medical assistant, Grace Bushy, contributed to this visit by contacting the patient by phone 1 or 2 business days prior to the appointment and also followed up on the recommendations I made after the visit.  Time spent on virtual visit: 6 m inutes   HPI: This is a very pleasant 68 year old woman  I last saw her about 2 years ago, October 2018.  This was for rather abrupt bowel habit changes and some right sided abdominal pains..  Lab testing October 2018 including CBC and complete metabolic profile were all normal.  CT scan abdomen pelvis with IV and oral contrast was unrevealing.  She had felt better after a MiraLAX purge and then starting once daily MiraLAX.  Her bowels have improved and she was not interested in any further  testing at that point.  Chief complaint is change in bowel habits, lower abdominal pains, odynophagia  Substernal chest discomfort while swallowing, odynophagia. Even with liquids.  Not every swallow.  Since April.  She has gurgling in her stomach, lower abdomen.  This is bothersome to her. She has soreness in both right and left sides of her abdomen.  This has been going on since aApril. She has scibbolous stools.  No bleeding.   She feels her weight has been overall stable.  She was on a z pack 2 months.  Her upper and lower GI symptoms seem to have started before these antibiotics  ROS: complete GI ROS as described in HPI, all other review negative.  Constitutional:  No unintentional weight loss   Past Medical History:  Diagnosis Date  . Allergic rhinitis       . Asthma       . Chest pressure    a. Normal Stress Echo 05/2010  . GERD (gastroesophageal reflux disease)   . Hyperlipidemia   . Hypertension   . Hypothyroidism   . Irritable bowel syndrome   . Pancreatitis    after ERCP    Past Surgical History:  Procedure Laterality Date  . BRAVO [place] STUDY  05/02/2011   Procedure: BRAVO [place] STUDY;  Surgeon: Owens Loffler, MD;  Location: [facility];  Service: Endoscopy;  Laterality: N/A;  48 hour wirless pH test ([employer] Test)  . CHOLECYSTECTOMY    . ESOPHAGOGASTRODUODENOSCOPY  05/02/2011   Procedure: ESOPHAGOGASTRODUODENOSCOPY (EGD);  Surgeon: Owens Loffler, MD;  Location: [facility];  Service: Endoscopy;  Laterality: N/A;  .  HERNIA REPAIR    . LEFT HEART CATH AND CORONARY ANGIOGRAPHY N/A 06/26/2017   Procedure: LEFT HEART CATH AND CORONARY ANGIOGRAPHY;  Surgeon: Belva Crome, MD;  Location: [facility];  Service: Cardiovascular;  Laterality: N/A;  . MENISCUS REPAIR Left 03/18/2016   and repair of cracked bone  . SALPINGOOPHORECTOMY    . TOTAL ABDOMINAL HYSTERECTOMY      Current Outpatient Medications  Medication Sig Dispense Refill  . acetaminophen (TYLENOL) 500  MG tablet Take 500 mg by mouth every 6 (six) hours as needed for mild pain.    Marland Kitchen albuterol (PROVENTIL HFA) 108 (90 Base) MCG/ACT inhaler INHALE 2 PUFFS INTO THE LUNGS EVERY 6 (SIX) HOURS AS NEEDED. 1 Inhaler 3  . aspirin 81 MG tablet Take 81 mg by mouth daily.    Marland Kitchen atorvastatin (LIPITOR) 40 MG tablet Take 1 tablet (40 mg total) by mouth daily. Please keep upcoming appt in August with Dr. Marlou Porch for future refills. Thank yu 90 tablet 1  . budesonide-formoterol (SYMBICORT) 80-4.5 MCG/ACT inhaler Inhale 2 puffs into the lungs 2 (two) times daily. 1 Inhaler 3  . cetirizine (ZYRTEC) 10 MG tablet Take 10 mg by mouth daily.    Marland Kitchen esomeprazole (NEXIUM) 40 MG capsule Take 1 capsule 30 min before breakfast. 30 capsule 11  . fish oil-omega-3 fatty acids 1000 MG capsule Take 1 g by mouth daily.     . fluticasone (FLONASE) 50 MCG/ACT nasal spray Place 2 sprays into both nostrils daily. (Patient taking differently: Place 1 spray into both nostrils daily as needed for allergies. ) 16 g 2  . indomethacin (INDOCIN) 25 MG capsule Take 25 mg by mouth daily as needed (headaches).     Marland Kitchen levothyroxine (SYNTHROID, LEVOTHROID) 100 MCG tablet Take 100 mcg by mouth daily before breakfast.    . metoprolol succinate (TOPROL-XL) 25 MG 24 hr tablet Take 25 mg by mouth daily.    . Multiple Vitamin (MULITIVITAMIN WITH MINERALS) TABS Take 1 tablet by mouth daily.     . ranitidine (ZANTAC) 150 MG capsule Take 1 capsule (150 mg total) by mouth every evening. 30 capsule 11   No current facility-administered medications for this visit.     Allergies as of 12/18/2018 - Review Complete 12/18/2018  Allergen Reaction Noted  . Penicillins Rash 11/24/2014    Family History  Problem Relation Age of Onset  . Stroke Mother   . Stroke Father   . Allergies Brother   . Allergies Sister   . Heart attack Brother   . Heart attack Brother   . Allergies Son   . Colon cancer Neg Hx   . Stomach cancer Neg Hx   . Esophageal cancer Neg Hx    . Lung disease Neg Hx   . Cancer Neg Hx     Social History   Socioeconomic History  . Marital status: Married    Spouse name: Not on file  . Number of children: 2  . Years of education: Not on file  . Highest education level: Not on file  Occupational History  . Occupation: retired    Fish farm manager: [employer]  . Financial resource strain: Not on file  . Food insecurity    Worry: Not on file    Inability: Not on file  . Transportation needs    Medical: Not on file    Non-medical: Not on file  Tobacco Use  . Smoking status: Never Smoker  . Smokeless tobacco:  Never Used  Substance and Sexual Activity  . Alcohol use: No  . Drug use: No  . Sexual activity: Not on file  Lifestyle  . Physical activity    Days per week: Not on file    Minutes per session: Not on file  . Stress: Not on file  Relationships  . Social Herbalist on phone: Not on file    Gets together: Not on file    Attends religious service: Not on file    Active member of club or organization: Not on file    Attends meetings of clubs or organizations: Not on file    Relationship status: Not on file  . Intimate partner violence    Fear of current or ex partner: Not on file    Emotionally abused: Not on file    Physically abused: Not on file    Forced sexual activity: Not on file  Other Topics Concern  . 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   Currently retired as of 07/2012      [facility] (11/26/16):   Originally from [facility]. Has always lived in [place]. She retired from [employer]. 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 No pets currently. No bird exposure. No mold or hot tub exposure. Does have carpet in her bedroom. No feather bedding. She has blinds & draperies. No indoor plants. Enjoys helping out in her church and with nonprofit work. Enjoys going to the beach.      Physical  Exam: Unable to perform because this was a "telemed visit" due to current Covid-19 pandemic  Assessment and plan: 68 y.o. female with upper and lower GI symptoms  She has again noticed a change in her bowel habits and that is also associated with some lower abdominal discomfort.  I recommended a colonoscopy since it is been about 5-1/2 years since her last one.  She had similar complaints about 2 years ago worked up by lab tests and normal CT scan.  At the same time as the colonoscopy will proceed with an upper endoscopy for her odynophagia.  I see no reason for any further blood tests or imaging studies prior to then.  Please see the "Patient Instructions" section for addition details about the plan.  Owens Loffler, MD [facility] 12/18/2018, 8:28 AM

## 2018-12-23 ENCOUNTER — Telehealth: Payer: Self-pay | Admitting: Cardiology

## 2018-12-23 DIAGNOSIS — I10 Essential (primary) hypertension: Secondary | ICD-10-CM

## 2018-12-23 DIAGNOSIS — Z79899 Other long term (current) drug therapy: Secondary | ICD-10-CM

## 2018-12-23 DIAGNOSIS — E785 Hyperlipidemia, unspecified: Secondary | ICD-10-CM

## 2018-12-23 NOTE — Telephone Encounter (Signed)
New Message:   Pt wants to know if she needs lab work before her yearly appt on 01-18-19?

## 2018-12-23 NOTE — Telephone Encounter (Signed)
Will forward to Dr Marlou Porch to see if there is any lab work he wants prior to pt's visit.

## 2018-12-25 ENCOUNTER — Ambulatory Visit: Payer: Medicare Other

## 2018-12-25 DIAGNOSIS — M170 Bilateral primary osteoarthritis of knee: Secondary | ICD-10-CM | POA: Diagnosis not present

## 2018-12-28 NOTE — Telephone Encounter (Signed)
CBC, CMET, Lipid panel. Thanks Candee Furbish, MD

## 2019-01-04 NOTE — Telephone Encounter (Signed)
Pt aware of orders - orders placed and lab scheduled with pt for 8/27.

## 2019-01-05 NOTE — Progress Notes (Signed)
Reviewed and agree with assessment/plan.   Syndi Pua, MD [facility] 05/15/2016, 12:24 PM Pager:  [phone]  

## 2019-01-08 ENCOUNTER — Telehealth: Payer: Self-pay | Admitting: Gastroenterology

## 2019-01-08 NOTE — Telephone Encounter (Signed)

## 2019-01-11 ENCOUNTER — Other Ambulatory Visit: Payer: Self-pay

## 2019-01-11 ENCOUNTER — Ambulatory Visit (AMBULATORY_SURGERY_CENTER): Payer: Medicare Other | Admitting: Gastroenterology

## 2019-01-11 ENCOUNTER — Encounter: Payer: Self-pay | Admitting: Gastroenterology

## 2019-01-11 VITALS — BP 139/79 | HR 84 | Temp 97.7°F | Resp 15 | Ht 61.0 in | Wt 170.0 lb

## 2019-01-11 DIAGNOSIS — K5730 Diverticulosis of large intestine without perforation or abscess without bleeding: Secondary | ICD-10-CM

## 2019-01-11 DIAGNOSIS — R1310 Dysphagia, unspecified: Secondary | ICD-10-CM | POA: Diagnosis not present

## 2019-01-11 DIAGNOSIS — R194 Change in bowel habit: Secondary | ICD-10-CM

## 2019-01-11 DIAGNOSIS — K2970 Gastritis, unspecified, without bleeding: Secondary | ICD-10-CM | POA: Diagnosis not present

## 2019-01-11 DIAGNOSIS — K3189 Other diseases of stomach and duodenum: Secondary | ICD-10-CM | POA: Diagnosis not present

## 2019-01-11 DIAGNOSIS — D124 Benign neoplasm of descending colon: Secondary | ICD-10-CM

## 2019-01-11 DIAGNOSIS — K635 Polyp of colon: Secondary | ICD-10-CM | POA: Diagnosis not present

## 2019-01-11 MED ORDER — SODIUM CHLORIDE 0.9 % IV SOLN: 500.0000 mL | Freq: Once | INTRAVENOUS | Status: DC

## 2019-01-11 NOTE — Op Note (Signed)
[facility] Patient Name: Dominique Mooney Procedure Date: 01/11/2019 1:51 PM MRN: [id number] Endoscopist: Milus Banister , MD Age: 68 Referring MD:  Date of Birth: 01/03/1951 Gender: Female Account #: [id number] Procedure:                Colonoscopy Indications:              Change in bowel habits Medicines:                Monitored Anesthesia Care Procedure:                Pre-Anesthesia Assessment:                           - Prior to the procedure, a History and Physical                            was performed, and patient medications and                            allergies were reviewed. The patient's tolerance of                            previous anesthesia was also reviewed. The risks                            and benefits of the procedure and the sedation                            options and risks were discussed with the patient.                            All questions were answered, and informed consent                            was obtained. Prior Anticoagulants: The patient has                            taken no previous anticoagulant or antiplatelet                            agents. ASA Grade Assessment: III After reviewing                            the risks and benefits, the patient was deemed in                            satisfactory condition to undergo the procedure.                           After obtaining informed consent, the colonoscope                            was passed under direct vision. Throughout the  procedure, the patient's blood pressure, pulse, and                            oxygen saturations were monitored continuously. The                            Colonoscope was introduced through the anus and                            advanced to the the cecum, identified by                            appendiceal orifice and ileocecal valve. The                            colonoscopy was performed without  difficulty. The                            patient tolerated the procedure well. The quality                            of the bowel preparation was good. The ileocecal                            valve, appendiceal orifice, and rectum were                            photographed. Scope In: 1:54:00 PM Scope Out: 2:09:54 PM Scope Withdrawal Time: 0 hours 9 minutes 4 seconds  Total Procedure Duration: 0 hours 15 minutes 54 seconds  Findings:                 A 4 mm polyp was found in the descending colon. The                            polyp was sessile. The polyp was removed with a                            cold snare. Resection and retrieval were complete.                           Multiple small and large-mouthed diverticula were                            found in the left colon.                           The exam was otherwise without abnormality on                            direct and retroflexion views. Complications:            No immediate complications. Estimated blood loss:  None. Estimated Blood Loss:     Estimated blood loss: none. Impression:               - One 4 mm polyp in the descending colon, removed                            with a cold snare. Resected and retrieved.                           - Diverticulosis in the left colon.                           - The examination was otherwise normal on direct                            and retroflexion views. Recommendation:           - EGD now.                           - Repeat colonoscopy is recommended. The                            colonoscopy date will be determined after pathology                            results from today's exam become available for                            review. Milus Banister, MD 01/11/2019 2:12:17 PM This report has been signed electronically.

## 2019-01-11 NOTE — Op Note (Signed)
Dominique Mooney Procedure Date: 01/11/2019 1:50 PM MRN: [id number] Endoscopist: Milus Banister , MD Age: 68 Referring MD:  Date of Birth: 1950/11/28 Gender: Female Account #: [id number] Procedure:                Upper GI endoscopy Indications:              Odynophagia Medicines:                Monitored Anesthesia Care Procedure:                Pre-Anesthesia Assessment:                           - Prior to the procedure, a History and Physical                            was performed, and patient medications and                            allergies were reviewed. The patient's tolerance of                            previous anesthesia was also reviewed. The risks                            and benefits of the procedure and the sedation                            options and risks were discussed with the patient.                            All questions were answered, and informed consent                            was obtained. Prior Anticoagulants: The patient has                            taken no previous anticoagulant or antiplatelet                            agents. ASA Grade Assessment: III - A patient with                            severe systemic disease. After reviewing the risks                            and benefits, the patient was deemed in                            satisfactory condition to undergo the procedure.                           After obtaining informed consent, the endoscope was  passed under direct vision. Throughout the                            procedure, the patient's blood pressure, pulse, and                            oxygen saturations were monitored continuously. The                            Endoscope was introduced through the mouth, and                            advanced to the second part of duodenum. The upper                            GI endoscopy was accomplished without  difficulty.                            The patient tolerated the procedure well. Scope In: Scope Out: Findings:                 Mild inflammation characterized by friability and                            granularity was found in the gastric antrum.                            Biopsies were taken with a cold forceps for                            histology.                           The exam was otherwise without abnormality. Complications:            No immediate complications. Estimated blood loss:                            None. Estimated Blood Loss:     Estimated blood loss: none. Impression:               - Gastritis. Biopsied to check for H. pylori.                           - The examination was otherwise normal. Recommendation:           - Patient has a contact number available for                            emergencies. The signs and symptoms of potential                            delayed complications were discussed with the                            patient. Return to normal activities tomorrow.  Written discharge instructions were provided to the                            patient.                           - Resume previous diet.                           - Continue present medications.                           - Await pathology results. Milus Banister, MD 01/11/2019 2:22:31 PM This report has been signed electronically.

## 2019-01-11 NOTE — Patient Instructions (Signed)
Handouts given : Polyps,Diverticulosis and Gastritis  YOU HAD AN ENDOSCOPIC PROCEDURE TODAY AT [facility]:   Refer to the procedure report that was given to you for any specific questions about what was found during the examination.  If the procedure report does not answer your questions, please call your gastroenterologist to clarify.  If you requested that your care partner not be given the details of your procedure findings, then the procedure report has been included in a sealed envelope for you to review at your convenience later.  YOU SHOULD EXPECT: Some feelings of bloating in the abdomen. Passage of more gas than usual.  Walking can help get rid of the air that was put into your GI tract during the procedure and reduce the bloating. If you had a lower endoscopy (such as a colonoscopy or flexible sigmoidoscopy) you may notice spotting of blood in your stool or on the toilet paper. If you underwent a bowel prep for your procedure, you may not have a normal bowel movement for a few days.  Please Note:  You might notice some irritation and congestion in your nose or some drainage.  This is from the oxygen used during your procedure.  There is no need for concern and it should clear up in a day or so.  SYMPTOMS TO REPORT IMMEDIATELY:   Following lower endoscopy (colonoscopy or flexible sigmoidoscopy):  Excessive amounts of blood in the stool  Significant tenderness or worsening of abdominal pains  Swelling of the abdomen that is new, acute  Fever of 100F or higher   Following upper endoscopy (EGD)  Vomiting of blood or coffee ground material  New chest pain or pain under the shoulder blades  Painful or persistently difficult swallowing  New shortness of breath  Fever of 100F or higher  Black, tarry-looking stools  For urgent or emergent issues, a gastroenterologist can be reached at any hour by calling [phone].   DIET:  We do recommend a small meal at  first, but then you may proceed to your regular diet.  Drink plenty of fluids but you should avoid alcoholic beverages for 24 hours.  ACTIVITY:  You should plan to take it easy for the rest of today and you should NOT DRIVE or use heavy machinery until tomorrow (because of the sedation medicines used during the test).    FOLLOW UP: Our staff will call the number listed on your records 48-72 hours following your procedure to check on you and address any questions or concerns that you may have regarding the information given to you following your procedure. If we do not reach you, we will leave a message.  We will attempt to reach you two times.  During this call, we will ask if you have developed any symptoms of COVID 19. If you develop any symptoms (ie: fever, flu-like symptoms, shortness of breath, cough etc.) before then, please call [phone].  If you test positive for Covid 19 in the 2 weeks post procedure, please call and report this information to [place].    If any biopsies were taken you will be contacted by phone or by letter within the next 1-3 weeks.  Please call us at [phone] if you have not heard about the biopsies in 3 weeks.    SIGNATURES/CONFIDENTIALITY: You and/or your care partner have signed paperwork which will be entered into your electronic medical record.  These signatures attest to the fact that that the information above on your After  Visit Summary has been reviewed and is understood.  Full responsibility of the confidentiality of this discharge information lies with you and/or your care-partner. 

## 2019-01-11 NOTE — Progress Notes (Signed)
Report to PACU, RN, vss, BBS= Clear.  

## 2019-01-11 NOTE — Progress Notes (Signed)
I have reviewed the patient's medical history in detail and updated the computerized patient record.  No egg or soy allergy  JB temp CW vitals

## 2019-01-11 NOTE — Progress Notes (Signed)
Called to room to assist during endoscopic procedure.  Patient ID and intended procedure confirmed with present staff. Received instructions for my participation in the procedure from the performing physician.  

## 2019-01-13 ENCOUNTER — Telehealth: Payer: Self-pay

## 2019-01-13 ENCOUNTER — Telehealth: Payer: Self-pay | Admitting: *Deleted

## 2019-01-13 NOTE — Telephone Encounter (Signed)
No answer, left message to call back later today, B.Yanci Bachtell RN. 

## 2019-01-13 NOTE — Telephone Encounter (Signed)
  Follow up Call-  Call back number 01/11/2019  Post procedure Call Back phone  # [phone]  Permission to leave phone message Yes  Some recent data might be hidden     Patient questions:  Do you have a fever, pain , or abdominal swelling? No. Pain Score  0 *  Have you tolerated food without any problems? Yes.    Have you been able to return to your normal activities? Yes.    Do you have any questions about your discharge instructions: Diet   No. Medications  No. Follow up visit  No.  Do you have questions or concerns about your Care? No.  Actions: * If pain score is 4 or above: No action needed, pain <4.   1. Have you developed a fever since your procedure? no  2.   Have you had an respiratory symptoms (SOB or cough) since your procedure? no  3.   Have you tested positive for COVID 19 since your procedure no  4.   Have you had any family members/close contacts diagnosed with the COVID 19 since your procedure?  no   If yes to any of these questions please route to Joylene John, RN and Alphonsa Gin, Therapist, sports.

## 2019-01-14 ENCOUNTER — Other Ambulatory Visit: Payer: Self-pay

## 2019-01-14 ENCOUNTER — Other Ambulatory Visit: Payer: Medicare Other | Admitting: *Deleted

## 2019-01-14 DIAGNOSIS — I10 Essential (primary) hypertension: Secondary | ICD-10-CM | POA: Diagnosis not present

## 2019-01-14 DIAGNOSIS — E785 Hyperlipidemia, unspecified: Secondary | ICD-10-CM

## 2019-01-14 DIAGNOSIS — Z79899 Other long term (current) drug therapy: Secondary | ICD-10-CM | POA: Diagnosis not present

## 2019-01-14 LAB — COMPREHENSIVE METABOLIC PANEL
ALT: 6 IU/L (ref 0–32)
AST: 18 IU/L (ref 0–40)
Albumin/Globulin Ratio: 1.8 (ref 1.2–2.2)
Albumin: 4.2 g/dL (ref 3.8–4.8)
Alkaline Phosphatase: 97 IU/L (ref 39–117)
BUN/Creatinine Ratio: 9 — ABNORMAL LOW (ref 12–28)
BUN: 9 mg/dL (ref 8–27)
Bilirubin Total: 0.6 mg/dL (ref 0.0–1.2)
CO2: 25 mmol/L (ref 20–29)
Calcium: 9.3 mg/dL (ref 8.7–10.3)
Chloride: 103 mmol/L (ref 96–106)
Creatinine, Ser: 1 mg/dL (ref 0.57–1.00)
GFR calc Af Amer: 68 mL/min/{1.73_m2} (ref >59)
GFR calc non Af Amer: 59 mL/min/{1.73_m2} — ABNORMAL LOW (ref >59)
Globulin, Total: 2.4 g/dL (ref 1.5–4.5)
Glucose: 90 mg/dL (ref 65–99)
Potassium: 4 mmol/L (ref 3.5–5.2)
Sodium: 142 mmol/L (ref 134–144)
Total Protein: 6.6 g/dL (ref 6.0–8.5)

## 2019-01-14 LAB — LIPID PANEL
Chol/HDL Ratio: 2.4 ratio (ref 0.0–4.4)
Cholesterol, Total: 166 mg/dL (ref 100–199)
HDL: 68 mg/dL (ref >39)
LDL Calculated: 82 mg/dL (ref 0–99)
Triglycerides: 80 mg/dL (ref 0–149)
VLDL Cholesterol Cal: 16 mg/dL (ref 5–40)

## 2019-01-14 LAB — CBC
Hematocrit: 36.8 % (ref 34.0–46.6)
Hemoglobin: 12.2 g/dL (ref 11.1–15.9)
MCH: 30.5 pg (ref 26.6–33.0)
MCHC: 33.2 g/dL (ref 31.5–35.7)
MCV: 92 fL (ref 79–97)
Platelets: 279 10*3/uL (ref 150–450)
RBC: 4 x10E6/uL (ref 3.77–5.28)
RDW: 12.7 % (ref 11.7–15.4)
WBC: 6.4 10*3/uL (ref 3.4–10.8)

## 2019-01-15 ENCOUNTER — Encounter: Payer: Self-pay | Admitting: Gastroenterology

## 2019-01-17 NOTE — Progress Notes (Signed)
Cardiology Office Note:    Date:  01/18/2019   ID:  [name], DOB 1951-05-20, MRN [id number]  PCP:  Dominique Skates, MD (Inactive)  Cardiologist:  Candee Furbish, MD    Referring MD: No ref. provider found     History of Present Illness:    Dominique Mooney is a 68 y.o. female here for the evaluation of chest pain at the request of Dr. Halford Mooney.  She has a history of asthma.  During her last visit and pulmonary on 05/06/17 she described chest tightness in the morning hour described as a tightness/heaviness that resolved after a few minutes.  It was pressure-like.  No radiation, no nausea.  She thought it may have been her asthma.  Unsure.  She does have a very strong family history of coronary artery disease.  Back in 2013 she underwent a cardiac stress test which was negative.  EKG was performed at that office visit, personally reviewed, which showed T wave inversions in 3, F, V4 and V5. Lost 2 brothers to MI. Palpitations.   07/18/17-underwent cardiac catheterization, reassuring, no flow-limiting CAD.  No need for percutaneous intervention.  Excellent hemoglobin A1c of 5.7, LDL 105, we have increased her statin therapy to high intensity statin atorvastatin 40 mg, LDL was 105.  Our goal is going to be 70.  She did feel some gas-like discomfort, she drank a Coke and was able to belch and it went away.  No fevers chills nausea vomiting syncope bleeding.  12/26/2018-here for follow-up of coronary artery disease.  Non-flow-limiting disease noted in catheterization in 2019.  Atorvastatin was started at 40 mg.  Her LDL is still at 82 above goal.  No myalgias.  We are going to change to 80 mg once a day.  Overall continue with weight loss.  Exercise.  Denies any fevers chills nausea vomiting syncope bleeding.  COVID - stir crazy. 6am 1.5 miles track every moring.   Past Medical History:  Diagnosis Date  . Allergic rhinitis       . Asthma       . Chest pressure    a. Normal Stress Echo 05/2010  .  GERD (gastroesophageal reflux disease)   . Hyperlipidemia   . Hypertension   . Hypothyroidism   . Irritable bowel syndrome   . Pancreatitis    after ERCP    Past Surgical History:  Procedure Laterality Date  . BRAVO [place] STUDY  05/02/2011   Procedure: BRAVO [place] STUDY;  Surgeon: Owens Loffler, MD;  Location: [facility];  Service: Endoscopy;  Laterality: N/A;  48 hour wirless pH test (Bravo Test)  . CHOLECYSTECTOMY    . ESOPHAGOGASTRODUODENOSCOPY  05/02/2011   Procedure: ESOPHAGOGASTRODUODENOSCOPY (EGD);  Surgeon: Owens Loffler, MD;  Location: [facility];  Service: Endoscopy;  Laterality: N/A;  . HERNIA REPAIR    . LEFT HEART CATH AND CORONARY ANGIOGRAPHY N/A 06/26/2017   Procedure: LEFT HEART CATH AND CORONARY ANGIOGRAPHY;  Surgeon: Belva Crome, MD;  Location: [facility];  Service: Cardiovascular;  Laterality: N/A;  . MENISCUS REPAIR Left 03/18/2016   and repair of cracked bone  . SALPINGOOPHORECTOMY    . TOTAL ABDOMINAL HYSTERECTOMY      Current Medications: Current Meds  Medication Sig  . acetaminophen (TYLENOL) 500 MG tablet Take 500 mg by mouth every 6 (six) hours as needed for mild pain.  Marland Kitchen albuterol (PROVENTIL HFA) 108 (90 Base) MCG/ACT inhaler INHALE 2 PUFFS INTO THE LUNGS EVERY 6 (SIX) HOURS AS NEEDED.  Marland Kitchen  aspirin 81 MG tablet Take 81 mg by mouth daily.  . budesonide-formoterol (SYMBICORT) 80-4.5 MCG/ACT inhaler Inhale 2 puffs into the lungs 2 (two) times daily.  . cetirizine (ZYRTEC) 10 MG tablet Take 10 mg by mouth daily.  . clonazePAM (KLONOPIN) 0.5 MG tablet Take 0.5 mg by mouth at bedtime as needed.  Marland Kitchen esomeprazole (NEXIUM) 40 MG capsule Take 1 capsule 30 min before breakfast.  . fish oil-omega-3 fatty acids 1000 MG capsule Take 1 g by mouth daily.   . fluticasone (FLONASE) 50 MCG/ACT nasal spray Place 2 sprays into both nostrils daily.  . indomethacin (INDOCIN) 25 MG capsule Take 1 capsule (25 mg total) by mouth daily as needed (headaches).  . metoprolol  succinate (TOPROL-XL) 25 MG 24 hr tablet Take 1 tablet (25 mg total) by mouth daily.  . Multiple Vitamin (MULITIVITAMIN WITH MINERALS) TABS Take 1 tablet by mouth daily.   . ranitidine (ZANTAC) 150 MG capsule Take 1 capsule (150 mg total) by mouth every evening.  Marland Kitchen SYNTHROID 88 MCG tablet Take 88 mcg by mouth daily.   . [DISCONTINUED] atorvastatin (LIPITOR) 40 MG tablet Take 1 tablet (40 mg total) by mouth daily. Please keep upcoming appt in August with Dr. Marlou Porch for future refills. Thank yu  . [DISCONTINUED] indomethacin (INDOCIN) 25 MG capsule Take 25 mg by mouth daily as needed (headaches).  . [DISCONTINUED] metoprolol succinate (TOPROL-XL) 25 MG 24 hr tablet Take 25 mg by mouth daily.  . [DISCONTINUED] metoprolol succinate (TOPROL-XL) 25 MG 24 hr tablet Take 1 tablet (25 mg total) by mouth daily.     Allergies:   Penicillins   Social History   Socioeconomic History  . Marital status: Married    Spouse name: Not on file  . Number of children: 2  . Years of education: Not on file  . Highest education level: Not on file  Occupational History  . Occupation: retired    Fish farm manager: [employer]  . Financial resource strain: Not on file  . Food insecurity    Worry: Not on file    Inability: Not on file  . Transportation needs    Medical: Not on file    Non-medical: Not on file  Tobacco Use  . Smoking status: Never Smoker  . Smokeless tobacco: Never Used  Substance and Sexual Activity  . Alcohol use: No  . Drug use: No  . Sexual activity: Not on file  Lifestyle  . Physical activity    Days per week: Not on file    Minutes per session: Not on file  . Stress: Not on file  Relationships  . Social Herbalist on phone: Not on file    Gets together: Not on file    Attends religious service: Not on file    Active member of club or organization: Not on file    Attends meetings of clubs or organizations: Not on file    Relationship status: Not  on file  Other Topics Concern  . 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   Currently retired as of 07/2012      [facility] (11/26/16):   Originally from [facility] [place] [facility] [place]. Has always lived in [place]. She retired from [employer]. 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 No pets currently. No bird exposure. No mold or hot tub exposure. Does have carpet in her bedroom.  No feather bedding. She has blinds & draperies. No indoor plants. Enjoys helping out in her church and with nonprofit work. Enjoys going to the beach.      Family History: The patient's family history includes Allergies in her brother, sister, and son; Colon polyps in her sister; Heart attack in her brother and brother; Stroke in her father and mother. There is no history of Colon cancer, Stomach cancer, Esophageal cancer, Lung disease, Cancer, or Rectal cancer.   ROS:   Please see the history of present illness.    All other review of systems negative  EKGs/Labs/Other Studies Reviewed:    The following studies were reviewed today: Prior office notes, EKG, lab work.  Cardiac catheterization reviewed.  EKG: EKG today 01/18/2019-sinus bradycardia 56 with no other abnormalities personally reviewed.  Recent Labs: 01/14/2019: ALT 6; BUN 9; Creatinine, Ser 1.00; Hemoglobin 12.2; Platelets 279; Potassium 4.0; Sodium 142  Recent Lipid Panel    Component Value Date/Time   CHOL 166 01/14/2019 0807   TRIG 80 01/14/2019 0807   HDL 68 01/14/2019 0807   CHOLHDL 2.4 01/14/2019 0807   LDLCALC 82 01/14/2019 0807    Physical Exam:    VS:  BP 126/70   Pulse (!) 56   Ht 5\' 1"  (1.549 m)   Wt 189 lb (85.7 kg)   SpO2 98%   BMI 35.71 kg/m     Wt Readings from Last 3 Encounters:  01/18/19 189 lb (85.7 kg)  01/11/19 170 lb (77.1 kg)  12/18/18 170 lb (77.1 kg)    GEN: Well nourished, well developed, in no acute distress  HEENT: normal  Neck: no JVD, carotid  bruits, or masses Cardiac: RRR; no murmurs, rubs, or gallops,no edema  Respiratory:  clear to auscultation bilaterally, normal work of breathing GI: soft, nontender, nondistended, + BS MS: no deformity or atrophy  Skin: warm and dry, no rash Neuro:  Alert and Oriented x 3, Strength and sensation are intact Psych: euthymic mood, full affect   ASSESSMENT:    1. Coronary artery disease involving native coronary artery of native heart without angina pectoris   2. Essential hypertension, benign   3. Hyperlipidemia, unspecified hyperlipidemia type    PLAN:    In order of problems listed above:  Coronary artery disease -Coronary CT scan results reviewed.  FFR demonstrated 0.74 in the distal LAD distribution, however there was nonobstructive coronary artery disease on catheterization in 2019 as described below..    She is quite anxious given her family history of CAD.   I spent a lengthy amount of time discussing prevention.  Her EF is normal.  She was a bit tearful at times about this diagnosis given her family history.  Catheterization reviewed once again from February 2019 as below.  Cath 06/26/17:  First diagonal, 40% obstruction in the ostium and mid vessel.  LAD wraps around the left ventricular apex and contains diffuse 30% narrowing in the distal segment.  Circumflex is normal.  Right dominant anatomy with normal RCA  Normal left main  Normal left ventricular function and hemodynamics with EF 65% and EDP 9 mmHg  RECOMMENDATIONS:   No significant obstructive disease is noted.  Suggest aggressive risk factor modification to improve endothelial function and decrease the risk of progression.  Guideline directed targets would be LDL less than 70, A1c less than 7, tight blood pressure control, aerobic activity, and weight loss.  Would also be appropriate to determine if there is clinical suspicion of sleep apnea.  Her brother has had valve surgery in the past.  No changes.  Mild persistent asthma -Symbicort.  Non-smoker.  Developed asthma later in her life.  Hyperlipidemia -Now on atorvastatin 40 mg, high intensity statin given her coronary artery disease. LDL 82 on 12/2018.  Her goal LDL will be 70. Let increased to 80 mg once a day.  Recheck lipid panel in 3 months. 25month supply [facility].   Continue with aggressive prevention efforts, weight loss.  Cardiac diet has been given to her.  She has an elliptical machine.  She has been walking the track.  We will follow-up with lipid panel and if successfully below 70, LDL.   Medication Adjustments/Labs and Tests Ordered: Current medicines are reviewed at length with the patient today.  Concerns regarding medicines are outlined above.  Orders Placed This Encounter  Procedures  . Lipid Profile  . ALT  . EKG 12-Lead   Meds ordered this encounter  Medications  . atorvastatin (LIPITOR) 80 MG tablet    Sig: Take 1 tablet (80 mg total) by mouth daily.    Dispense:  90 tablet    Refill:  3  . DISCONTD: metoprolol succinate (TOPROL-XL) 25 MG 24 hr tablet    Sig: Take 1 tablet (25 mg total) by mouth daily.    Dispense:  90 tablet    Refill:  3  . metoprolol succinate (TOPROL-XL) 25 MG 24 hr tablet    Sig: Take 1 tablet (25 mg total) by mouth daily.    Dispense:  90 tablet    Refill:  3  . indomethacin (INDOCIN) 25 MG capsule    Sig: Take 1 capsule (25 mg total) by mouth daily as needed (headaches).    Dispense:  90 capsule    Refill:  3    Signed, Candee Furbish, MD  01/18/2019 10:05 AM    [place]

## 2019-01-18 ENCOUNTER — Ambulatory Visit (INDEPENDENT_AMBULATORY_CARE_PROVIDER_SITE_OTHER): Payer: Medicare Other | Admitting: Cardiology

## 2019-01-18 ENCOUNTER — Encounter: Payer: Self-pay | Admitting: Cardiology

## 2019-01-18 ENCOUNTER — Other Ambulatory Visit: Payer: Self-pay

## 2019-01-18 VITALS — BP 126/70 | HR 56 | Ht 61.0 in | Wt 189.0 lb

## 2019-01-18 DIAGNOSIS — I2510 Atherosclerotic heart disease of native coronary artery without angina pectoris: Secondary | ICD-10-CM

## 2019-01-18 DIAGNOSIS — I10 Essential (primary) hypertension: Secondary | ICD-10-CM

## 2019-01-18 DIAGNOSIS — E785 Hyperlipidemia, unspecified: Secondary | ICD-10-CM | POA: Diagnosis not present

## 2019-01-18 MED ORDER — INDOMETHACIN 25 MG PO CAPS: 25.0000 mg | ORAL_CAPSULE | Freq: Every day | ORAL | 3 refills | Status: DC | PRN

## 2019-01-18 MED ORDER — METOPROLOL SUCCINATE ER 25 MG PO TB24: 25.0000 mg | ORAL_TABLET | Freq: Every day | ORAL | 3 refills | Status: DC

## 2019-01-18 MED ORDER — ATORVASTATIN CALCIUM 80 MG PO TABS: 80.0000 mg | ORAL_TABLET | Freq: Every day | ORAL | 3 refills | Status: DC

## 2019-01-18 NOTE — Patient Instructions (Signed)
  Medication Instructions:  Your physician has recommended you make the following change in your medication: INCREASE ATORVASTATIN TO 80 MG DAILY  If you need a refill on your cardiac medications before your next appointment, please call your pharmacy.   Lab work: Your physician recommends that you return for lab work in: 3 MONTHS : LIPIDS, ALT  If you have labs (blood work) drawn today and your tests are completely normal, you will receive your results only by: Marland Kitchen MyChart Message (if you have MyChart) OR . A paper copy in the mail If you have any lab test that is abnormal or we need to change your treatment, we will call you to review the results.  Testing/Procedures: NONE ORDERED  Follow-Up: At [facility], you and your health needs are our priority.  As part of our continuing mission to provide you with exceptional heart care, we have created designated Provider Care Teams.  These Care Teams include your primary Cardiologist (physician) and Advanced Practice Providers (APPs -  Physician Assistants and Nurse Practitioners) who all work together to provide you with the care you need, when you need it. You will need a follow up appointment in 1 years.  Please call our office 2 months in advance to schedule this appointment.  You may see Candee Furbish, MD or one of the following Advanced Practice Providers on your designated Care Team:   Truitt Merle, NP Cecilie Kicks, NP . Kathyrn Drown, NP  Any Other Special Instructions Will Be Listed Below (If Applicable). Follow Low Carb Diet

## 2019-01-19 DIAGNOSIS — G4485 Primary stabbing headache: Secondary | ICD-10-CM | POA: Diagnosis not present

## 2019-01-27 DIAGNOSIS — M170 Bilateral primary osteoarthritis of knee: Secondary | ICD-10-CM | POA: Diagnosis not present

## 2019-02-01 DIAGNOSIS — M170 Bilateral primary osteoarthritis of knee: Secondary | ICD-10-CM | POA: Diagnosis not present

## 2019-02-08 DIAGNOSIS — M170 Bilateral primary osteoarthritis of knee: Secondary | ICD-10-CM | POA: Diagnosis not present

## 2019-02-22 DIAGNOSIS — E785 Hyperlipidemia, unspecified: Secondary | ICD-10-CM | POA: Diagnosis not present

## 2019-02-22 DIAGNOSIS — E119 Type 2 diabetes mellitus without complications: Secondary | ICD-10-CM | POA: Diagnosis not present

## 2019-02-23 ENCOUNTER — Other Ambulatory Visit: Payer: Self-pay | Admitting: Cardiology

## 2019-02-23 MED ORDER — ATORVASTATIN CALCIUM 80 MG PO TABS: 80.0000 mg | ORAL_TABLET | Freq: Every day | ORAL | 3 refills | Status: DC

## 2019-03-01 DIAGNOSIS — E1122 Type 2 diabetes mellitus with diabetic chronic kidney disease: Secondary | ICD-10-CM | POA: Diagnosis not present

## 2019-03-01 DIAGNOSIS — I129 Hypertensive chronic kidney disease with stage 1 through stage 4 chronic kidney disease, or unspecified chronic kidney disease: Secondary | ICD-10-CM | POA: Diagnosis not present

## 2019-03-01 DIAGNOSIS — E785 Hyperlipidemia, unspecified: Secondary | ICD-10-CM | POA: Diagnosis not present

## 2019-03-01 DIAGNOSIS — E039 Hypothyroidism, unspecified: Secondary | ICD-10-CM | POA: Diagnosis not present

## 2019-03-04 DIAGNOSIS — H40012 Open angle with borderline findings, low risk, left eye: Secondary | ICD-10-CM | POA: Diagnosis not present

## 2019-03-04 DIAGNOSIS — H401411 Capsular glaucoma with pseudoexfoliation of lens, right eye, mild stage: Secondary | ICD-10-CM | POA: Diagnosis not present

## 2019-03-04 DIAGNOSIS — H04123 Dry eye syndrome of bilateral lacrimal glands: Secondary | ICD-10-CM | POA: Diagnosis not present

## 2019-03-08 DIAGNOSIS — M1712 Unilateral primary osteoarthritis, left knee: Secondary | ICD-10-CM | POA: Diagnosis not present

## 2019-03-11 ENCOUNTER — Telehealth: Payer: Self-pay | Admitting: *Deleted

## 2019-03-11 DIAGNOSIS — Z01818 Encounter for other preprocedural examination: Secondary | ICD-10-CM | POA: Diagnosis not present

## 2019-03-11 NOTE — Telephone Encounter (Signed)
   Primary Cardiologist: Candee Furbish, MD   [name] 68 year old female with past medical history of essential hypertension, CAD, allergic rhinitis, mild persistent asthma, GERD, irritable bowel syndrome, pancreatitis, hypothyroidism, hyperlipidemia, constipation, mastodynia, and CP.  Chart reviewed as part of pre-operative protocol coverage. Given past medical history and time since last visit, based on ACC/AHA guidelines, Dominique Mooney would be at acceptable risk for the planned procedure without further cardiovascular testing.   She has an RCRI class I risk, 0.4% risk of major cardiac event.  She may hold her aspirin for 5 days prior to her left total knee arthroplasty and resume as soon as hemostasis has been achieved.  I will route this recommendation to the requesting party via Epic fax function and remove from pre-op pool.  Please call with questions.  [name]. [facility] [address] Office [phone] Fax [phone]

## 2019-03-11 NOTE — Telephone Encounter (Signed)
She may hold ASA for 5 days prior to left total knee arthroplasty. Dominique Furbish, MD

## 2019-03-11 NOTE — Telephone Encounter (Signed)
   [facility] Pre-operative Risk Assessment    Request for surgical clearance:  1. What type of surgery is being performed? LEFT TOTAL KNEE ARTHROPLASTY   2. When is this surgery scheduled? TBD   3. What type of clearance is required (medical clearance vs. Pharmacy clearance to hold med vs. Both)? MEDICAL  4. Are there any medications that need to be held prior to surgery and how long? ASA   5. Practice name and name of physician performing surgery? [facility]; [place]   6. What is your office phone number [phone]    7.   What is your office fax number [phone]  8.   Anesthesia type (None, local, MAC, general) ? SPINAL WITH ADDUCTOR   Julaine Hua 03/11/2019, 11:31 AM  _________________________________________________________________   (provider comments below)

## 2019-03-25 ENCOUNTER — Ambulatory Visit: Payer: Medicare Other

## 2019-03-29 ENCOUNTER — Other Ambulatory Visit: Payer: Self-pay

## 2019-03-29 ENCOUNTER — Ambulatory Visit
Admission: RE | Admit: 2019-03-29 | Discharge: 2019-03-29 | Disposition: A | Discharge status: Home / Self Care | Payer: Medicare Other | Source: Ambulatory Visit | Attending: Obstetrics and Gynecology | Admitting: Obstetrics and Gynecology

## 2019-03-29 DIAGNOSIS — Z1231 Encounter for screening mammogram for malignant neoplasm of breast: Secondary | ICD-10-CM | POA: Diagnosis not present

## 2019-04-20 ENCOUNTER — Other Ambulatory Visit: Payer: Medicare Other

## 2019-04-21 ENCOUNTER — Other Ambulatory Visit: Payer: Self-pay

## 2019-04-21 ENCOUNTER — Other Ambulatory Visit: Payer: Medicare Other | Admitting: *Deleted

## 2019-04-21 DIAGNOSIS — E785 Hyperlipidemia, unspecified: Secondary | ICD-10-CM

## 2019-04-21 LAB — ALT: ALT: 6 IU/L (ref 0–32)

## 2019-04-21 LAB — LIPID PANEL
Chol/HDL Ratio: 2.3 ratio (ref 0.0–4.4)
Cholesterol, Total: 136 mg/dL (ref 100–199)
HDL: 60 mg/dL (ref >39)
LDL Chol Calc (NIH): 60 mg/dL (ref 0–99)
Triglycerides: 86 mg/dL (ref 0–149)
VLDL Cholesterol Cal: 16 mg/dL (ref 5–40)

## 2019-04-25 ENCOUNTER — Encounter: Payer: Self-pay | Admitting: Emergency Medicine

## 2019-04-25 ENCOUNTER — Other Ambulatory Visit: Payer: Self-pay

## 2019-04-25 ENCOUNTER — Ambulatory Visit
Admission: EM | Admit: 2019-04-25 | Discharge: 2019-04-25 | Disposition: A | Discharge status: Home / Self Care | Payer: Medicare Other | Attending: Emergency Medicine | Admitting: Emergency Medicine

## 2019-04-25 DIAGNOSIS — T22212A Burn of second degree of left forearm, initial encounter: Secondary | ICD-10-CM

## 2019-04-25 MED ORDER — SILVER SULFADIAZINE 1 % EX CREA
TOPICAL_CREAM | Freq: Every day | CUTANEOUS | Status: DC
  Administered 2019-04-25: 15:00:00 via TOPICAL
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
## 2019-04-25 NOTE — ED Notes (Signed)
Patient able to ambulate independently  
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
## 2019-04-26 ENCOUNTER — Encounter: Payer: Self-pay | Admitting: *Deleted

## 2019-04-26 ENCOUNTER — Telehealth: Payer: Self-pay | Admitting: Cardiology

## 2019-04-26 NOTE — Telephone Encounter (Signed)
New Message   Patient would like a call to go over lab results. Please call.

## 2019-04-26 NOTE — Telephone Encounter (Signed)
Reviewed lipid/alt results and Dr Marlou Porch recommendations to continue current therapy.  Pt states understanding and thanked me for the call and information

## 2019-04-29 DIAGNOSIS — Z23 Encounter for immunization: Secondary | ICD-10-CM | POA: Diagnosis not present

## 2019-04-29 DIAGNOSIS — T2220XA Burn of second degree of shoulder and upper limb, except wrist and hand, unspecified site, initial encounter: Secondary | ICD-10-CM | POA: Diagnosis not present

## 2019-06-18 ENCOUNTER — Ambulatory Visit: Admit: 2019-06-18 | Payer: Medicare Other | Admitting: Specialist

## 2019-06-18 SURGERY — ARTHROPLASTY, KNEE, TOTAL
Anesthesia: Spinal | Site: Knee | Laterality: Left

## 2019-07-08 NOTE — Patient Instructions (Addendum)
DUE TO COVID-19 ONLY ONE VISITOR IS ALLOWED TO COME WITH YOU AND STAY IN THE WAITING ROOM ONLY DURING PRE OP AND PROCEDURE DAY OF SURGERY. THE 1 VISITOR MAY VISIT WITH YOU AFTER SURGERY IN YOUR PRIVATE ROOM DURING VISITING HOURS ONLY!  YOU NEED TO HAVE A COVID 19 TEST ON 07/13/19  @   2:30 pm     , THIS TEST MUST BE DONE BEFORE SURGERY, COME  [address], [place] , [address].  ([place]) ONCE YOUR COVID TEST IS COMPLETED, PLEASE BEGIN THE QUARANTINE INSTRUCTIONS AS OUTLINED IN YOUR HANDOUT.                ALANY SHIMABUKURO     Your procedure is scheduled on: 07/16/19   Report to [facility] Main  Entrance   Report to SHORT STAY at: 5:30 AM     Call this number if you have problems the morning of surgery [phone]    Remember: NO SOLID FOOD AFTER MIDNIGHT THE NIGHT PRIOR TO SURGERY. NOTHING BY MOUTH EXCEPT CLEAR LIQUIDS UNTIL: 4:30 am . PLEASE FINISH ENSURE DRINK PER SURGEON ORDER  WHICH NEEDS TO BE COMPLETED AT : 4:30 am   CLEAR LIQUID DIET   Foods Allowed                                                                     Foods Excluded  Coffee and tea, regular and decaf                             liquids that you cannot  Plain Jell-O any favor except red or purple                                           see through such as: Fruit ices (not with fruit pulp)                                     milk, soups, orange juice  Iced Popsicles                                    All solid food Carbonated beverages, regular and diet                                    Cranberry, grape and apple juices Sports drinks like Gatorade Lightly seasoned clear broth or consume(fat free) Sugar, honey syrup  Sample Menu Breakfast                                Lunch                                     Supper Cranberry  juice                    Beef broth                            Chicken broth Jell-O                                     Grape juice                            Apple juice Coffee or tea                        Jell-O                                      Popsicle                                                Coffee or tea                        Coffee or tea  _____________________________________________________________________     BRUSH YOUR TEETH MORNING OF SURGERY AND RINSE YOUR MOUTH OUT, NO CHEWING GUM CANDY OR MINTS.     Take these medicines the morning of surgery with A SIP OF WATER: CETIRIZINE,METOPROLOL,SYNTHROID.USE INHALERS.                                 You may not have any metal on your body including hair pins and              piercings  Do not wear jewelry, make-up, lotions, powders or perfumes, deodorant             Do not wear nail polish on your fingernails.  Do not shave  48 hours prior to surgery.                 Do not bring valuables to the hospital. [place].  Contacts, dentures or bridgework may not be worn into surgery.  Leave suitcase in the car. After surgery it may be brought to your room.     Patients discharged the day of surgery will not be allowed to drive home. IF YOU ARE HAVING SURGERY AND GOING HOME THE SAME DAY, YOU MUST HAVE AN ADULT TO DRIVE YOU HOME AND BE WITH YOU FOR 24 HOURS. YOU MAY GO HOME BY TAXI OR UBER OR ORTHERWISE, BUT AN ADULT MUST ACCOMPANY YOU HOME AND STAY WITH YOU FOR 24 HOURS.  Name and phone number of your driver:  Special Instructions: N/A              Please read over the following fact sheets you were given: _____________________________________________________________________  [facility] - Preparing for Surgery Before surgery, you can play an important role.  Because skin is not sterile, your skin needs to  be as free of germs as possible.  You can reduce the number of germs on your skin by washing with CHG (chlorahexidine gluconate) soap before surgery.  CHG is an antiseptic cleaner which kills germs and bonds with the  skin to continue killing germs even after washing. Please DO NOT use if you have an allergy to CHG or antibacterial soaps.  If your skin becomes reddened/irritated stop using the CHG and inform your nurse when you arrive at Short Stay. Do not shave (including legs and underarms) for at least 48 hours prior to the first CHG shower.  You may shave your face/neck. Please follow these instructions carefully:  1.  Shower with CHG Soap the night before surgery and the  morning of Surgery.  2.  If you choose to wash your hair, wash your hair first as usual with your  normal  shampoo.  3.  After you shampoo, rinse your hair and body thoroughly to remove the  shampoo.                           4.  Use CHG as you would any other liquid soap.  You can apply chg directly  to the skin and wash                       Gently with a scrungie or clean washcloth.  5.  Apply the CHG Soap to your body ONLY FROM THE NECK DOWN.   Do not use on face/ open                           Wound or open sores. Avoid contact with eyes, ears mouth and genitals (private parts).                       Wash face,  Genitals (private parts) with your normal soap.             6.  Wash thoroughly, paying special attention to the area where your surgery  will be performed.  7.  Thoroughly rinse your body with warm water from the neck down.  8.  DO NOT shower/wash with your normal soap after using and rinsing off  the CHG Soap.                9.  Pat yourself dry with a clean towel.            10.  Wear clean pajamas.            11.  Place clean sheets on your bed the night of your first shower and do not  sleep with pets. Day of Surgery : Do not apply any lotions/deodorants the morning of surgery.  Please wear clean clothes to the hospital/surgery center.  FAILURE TO FOLLOW THESE INSTRUCTIONS MAY RESULT IN THE CANCELLATION OF YOUR SURGERY PATIENT SIGNATURE_________________________________  NURSE  SIGNATURE__________________________________  ________________________________________________________________________   Adam Phenix  An incentive spirometer is a tool that can help keep your lungs clear and active. This tool measures how well you are filling your lungs with each breath. Taking long deep breaths may help reverse or decrease the chance of developing breathing (pulmonary) problems (especially infection) following:  A long period of time when you are unable to move or be active. BEFORE THE PROCEDURE   If the spirometer includes an indicator to show your  best effort, your nurse or respiratory therapist will set it to a desired goal.  If possible, sit up straight or lean slightly forward. Try not to slouch.  Hold the incentive spirometer in an upright position. INSTRUCTIONS FOR USE  1. Sit on the edge of your bed if possible, or sit up as far as you can in bed or on a chair. 2. Hold the incentive spirometer in an upright position. 3. Breathe out normally. 4. Place the mouthpiece in your mouth and seal your lips tightly around it. 5. Breathe in slowly and as deeply as possible, raising the piston or the ball toward the top of the column. 6. Hold your breath for 3-5 seconds or for as long as possible. Allow the piston or ball to fall to the bottom of the column. 7. Remove the mouthpiece from your mouth and breathe out normally. 8. Rest for a few seconds and repeat Steps 1 through 7 at least 10 times every 1-2 hours when you are awake. Take your time and take a few normal breaths between deep breaths. 9. The spirometer may include an indicator to show your best effort. Use the indicator as a goal to work toward during each repetition. 10. After each set of 10 deep breaths, practice coughing to be sure your lungs are clear. If you have an incision (the cut made at the time of surgery), support your incision when coughing by placing a pillow or rolled up towels firmly  against it. Once you are able to get out of bed, walk around indoors and cough well. You may stop using the incentive spirometer when instructed by your caregiver.  RISKS AND COMPLICATIONS  Take your time so you do not get dizzy or light-headed.  If you are in pain, you may need to take or ask for pain medication before doing incentive spirometry. It is harder to take a deep breath if you are having pain. AFTER USE  Rest and breathe slowly and easily.  It can be helpful to keep track of a log of your progress. Your caregiver can provide you with a simple table to help with this. If you are using the spirometer at home, follow these instructions: [place] IF:   You are having difficultly using the spirometer.  You have trouble using the spirometer as often as instructed.  Your pain medication is not giving enough relief while using the spirometer.  You develop fever of 100.5 F (38.1 C) or higher. SEEK IMMEDIATE MEDICAL CARE IF:   You cough up bloody sputum that had not been present before.  You develop fever of 102 F (38.9 C) or greater.  You develop worsening pain at or near the incision site. MAKE SURE YOU:   Understand these instructions.  Will watch your condition.  Will get help right away if you are not doing well or get worse. Document Released: 09/16/2006 Document Revised: 07/29/2011 Document Reviewed: 11/17/2006 [facility] Patient Information 2014 [place], [place].   ________________________________________________________________________

## 2019-07-08 NOTE — Progress Notes (Signed)
Can you please place orders for the upcoming surgery.Pt. has a PST phone interview on 07/12/19. Thank you.

## 2019-07-09 DIAGNOSIS — M1712 Unilateral primary osteoarthritis, left knee: Secondary | ICD-10-CM | POA: Diagnosis not present

## 2019-07-09 NOTE — H&P (Signed)
TOTAL KNEE ADMISSION H&P  Patient is being admitted for left total knee arthroplasty.  Subjective:  Chief Complaint:left knee pain.  HPI: Dominique Mooney, 69 y.o. female, has a history of pain and functional disability in the left knee due to arthritis and has failed non-surgical conservative treatments for greater than 12 weeks to includeNSAID's and/or analgesics, corticosteriod injections, viscosupplementation injections, flexibility and strengthening excercises, supervised PT with diminished ADL's post treatment, weight reduction as appropriate and activity modification.  Onset of symptoms was gradual, starting 3 years ago with gradually worsening course since that time. The patient noted prior procedures on the knee to include  arthroscopy and menisectomy on the left knee(s).  Patient currently rates pain in the left knee(s) at 8 out of 10 with activity. Patient has night pain, worsening of pain with activity and weight bearing, pain that interferes with activities of daily living, pain with passive range of motion, crepitus and joint swelling.  Patient has evidence of subchondral sclerosis, periarticular osteophytes and joint space narrowing by imaging studies. This patient has had no previous injury. There is no active infection.  Patient Active Problem List   Diagnosis Date Noted  . Coronary artery calcification seen on CT scan 06/26/2017  . CAD in native artery 06/26/2017  . Cough 11/26/2016  . Chest pain 11/26/2016  . Abnormal mammogram 10/19/2012  . Mastodynia 06/01/2012  . ESSENTIAL HYPERTENSION, BENIGN 05/28/2010  . Mild persistent asthma 08/02/2008  . Allergic rhinitis 01/05/2008  . GERD 10/09/2007  . CONSTIPATION 10/09/2007  . IRRITABLE BOWEL SYNDROME 10/09/2007  . HYPOTHYROIDISM 10/07/2007  . HYPERLIPIDEMIA 10/07/2007  . PANCREATITIS 10/07/2007   Past Medical History:  Diagnosis Date  . Allergic rhinitis       . Asthma       . Chest pressure    a. Normal Stress Echo  05/2010  . GERD (gastroesophageal reflux disease)   . Hyperlipidemia   . Hypertension   . Hypothyroidism   . Irritable bowel syndrome   . Pancreatitis    after ERCP    Past Surgical History:  Procedure Laterality Date  . BRAVO [place] STUDY  05/02/2011   Procedure: BRAVO [place] STUDY;  Surgeon: Owens Loffler, MD;  [facility];  Service: Endoscopy;  Laterality: N/A;  48 hour wirless pH test (Bravo Test)  . CHOLECYSTECTOMY    . ESOPHAGOGASTRODUODENOSCOPY  05/02/2011   Procedure: ESOPHAGOGASTRODUODENOSCOPY (EGD);  Surgeon: Owens Loffler, MD;  Location: [facility];  Service: Endoscopy;  Laterality: N/A;  . HERNIA REPAIR    . LEFT HEART CATH AND CORONARY ANGIOGRAPHY N/A 06/26/2017   Procedure: LEFT HEART CATH AND CORONARY ANGIOGRAPHY;  Surgeon: Belva Crome, MD;  Location: [place] [facility];  Service: Cardiovascular;  Laterality: N/A;  . MENISCUS REPAIR Left 03/18/2016   and repair of cracked bone  . SALPINGOOPHORECTOMY    . TOTAL ABDOMINAL HYSTERECTOMY      No current facility-administered medications for this encounter.   Current Outpatient Medications  Medication Sig Dispense Refill Last Dose  . acetaminophen (TYLENOL) 500 MG tablet Take 1,000 mg by mouth every 6 (six) hours as needed for mild pain.      Marland Kitchen albuterol (PROVENTIL HFA) 108 (90 Base) MCG/ACT inhaler INHALE 2 PUFFS INTO THE LUNGS EVERY 6 (SIX) HOURS AS NEEDED. (Patient taking differently: Inhale 2 puffs into the lungs every 6 (six) hours as needed for wheezing or shortness of breath. INHALE 2 PUFFS INTO THE LUNGS EVERY 6 (SIX) HOURS AS NEEDED.) 1 Inhaler 3   .  aspirin 81 MG tablet Take 81 mg by mouth daily.      Marland Kitchen atorvastatin (LIPITOR) 80 MG tablet Take 1 tablet (80 mg total) by mouth daily. 90 tablet 3   . budesonide-formoterol (SYMBICORT) 80-4.5 MCG/ACT inhaler Inhale 2 puffs into the lungs 2 (two) times daily. 1 Inhaler 3   . cetirizine (ZYRTEC) 10 MG tablet Take 10 mg by mouth daily as needed.      . clonazePAM  (KLONOPIN) 0.5 MG tablet Take 0.5 mg by mouth at bedtime as needed for anxiety.      . fish oil-omega-3 fatty acids 1000 MG capsule Take 1 g by mouth daily.      . fluticasone (FLONASE) 50 MCG/ACT nasal spray Place 2 sprays into both nostrils daily. 16 g 2   . indomethacin (INDOCIN) 25 MG capsule Take 1 capsule (25 mg total) by mouth daily as needed (headaches). 90 capsule 3   . metoprolol succinate (TOPROL-XL) 25 MG 24 hr tablet Take 1 tablet (25 mg total) by mouth daily. 90 tablet 3   . Multiple Vitamin (MULITIVITAMIN WITH MINERALS) TABS Take 1 tablet by mouth daily.      Marland Kitchen SYNTHROID 88 MCG tablet Take 88 mcg by mouth daily.      Marland Kitchen esomeprazole (NEXIUM) 40 MG capsule Take 1 capsule 30 min before breakfast. (Patient not taking: Reported on 07/05/2019) 30 capsule 11 Not Taking at Unknown time  . ranitidine (ZANTAC) 150 MG capsule Take 1 capsule (150 mg total) by mouth every evening. (Patient not taking: Reported on 07/05/2019) 30 capsule 11 Not Taking at Unknown time   Allergies  Allergen Reactions  . Penicillins Rash    Social History   Tobacco Use  . Smoking status: Never Smoker  . Smokeless tobacco: Never Used  Substance Use Topics  . Alcohol use: No    Family History  Problem Relation Age of Onset  . Stroke Mother   . Stroke Father   . Allergies Brother   . Allergies Sister   . Colon polyps Sister   . Heart attack Brother   . Heart attack Brother   . Allergies Son   . Colon cancer Neg Hx   . Stomach cancer Neg Hx   . Esophageal cancer Neg Hx   . Lung disease Neg Hx   . Cancer Neg Hx   . Rectal cancer Neg Hx      Review of Systems  Constitutional: Negative.   HENT: Negative.   Eyes: Negative.   Respiratory: Negative.   Cardiovascular: Negative.   Gastrointestinal: Negative.   Endocrine: Negative.   Genitourinary: Negative.   Musculoskeletal: Positive for arthralgias, gait problem and joint swelling.  Skin: Negative.   Allergic/Immunologic: Negative.    Psychiatric/Behavioral: Negative.     Objective:  Physical Exam  Constitutional: She is oriented to person, place, and time. She appears well-developed and well-nourished.  HENT:  Head: Normocephalic and atraumatic.  Neck: No JVD present. No thyromegaly present.  Cardiovascular: Normal rate, regular rhythm, normal heart sounds and intact distal pulses. Exam reveals no gallop and no friction rub.  No murmur heard. Respiratory: Effort normal and breath sounds normal. No respiratory distress. She has no wheezes. She has no rales. She exhibits no tenderness.  GI: Soft. She exhibits no distension and no mass. There is no abdominal tenderness. There is no rebound and no guarding.  Musculoskeletal:     Cervical back: Neck supple.     Comments: Tenderness with palpation over medial and lateal joint  line Crepitus with ROM Pain with ROM NVI in left lower extremity  Neurological: She is alert and oriented to person, place, and time.  Skin: Skin is warm and dry. No rash noted. No erythema. No pallor.  Psychiatric: She has a normal mood and affect. Her behavior is normal. Judgment and thought content normal.    Vital signs in last 24 hours:    Labs:   Estimated body mass index is 35.71 kg/m as calculated from the following:   Height as of 01/18/19: 5\' 1"  (1.549 m).   Weight as of 01/18/19: 85.7 kg.   Imaging Review Plain radiographs demonstrate severe degenerative joint disease of the left knee(s). The overall alignment ismild varus. The bone quality appears to be good for age and reported activity level.      Assessment/Plan:  End stage arthritis, left knee   The patient history, physical examination, clinical judgment of the provider and imaging studies are consistent with end stage degenerative joint disease of the left knee(s) and total knee arthroplasty is deemed medically necessary. The treatment options including medical management, injection therapy arthroscopy and  arthroplasty were discussed at length. The risks and benefits of total knee arthroplasty were presented and reviewed. The risks due to aseptic loosening, infection, stiffness, patella tracking problems, thromboembolic complications and other imponderables were discussed. The patient acknowledged the explanation, agreed to proceed with the plan and consent was signed. Patient is being admitted for inpatient treatment for surgery, pain control, PT, OT, prophylactic antibiotics, VTE prophylaxis, progressive ambulation and ADL's and discharge planning. The patient is planning to be discharged home with home health services Patient would like to do Home health PT that will need to be set up.     Patient's anticipated LOS is less than 2 midnights, meeting these requirements: - Younger than 82 - Lives within 1 hour of care - Has a competent adult at home to recover with post-op recover - NO history of  - Chronic pain requiring opiods  - Diabetes  - Coronary Artery Disease  - Heart failure  - Heart attack  - Stroke  - DVT/VTE  - Cardiac arrhythmia  - Respiratory Failure/COPD  - Renal failure  - Anemia  - Advanced Liver disease

## 2019-07-12 ENCOUNTER — Other Ambulatory Visit: Payer: Self-pay

## 2019-07-12 ENCOUNTER — Encounter (HOSPITAL_COMMUNITY): Payer: Self-pay

## 2019-07-12 ENCOUNTER — Encounter (HOSPITAL_COMMUNITY)
Admission: RE | Admit: 2019-07-12 | Discharge: 2019-07-12 | Disposition: A | Discharge status: Home / Self Care | Payer: Medicare Other | Source: Ambulatory Visit | Attending: Specialist | Admitting: Specialist

## 2019-07-12 DIAGNOSIS — J4530 Mild persistent asthma, uncomplicated: Secondary | ICD-10-CM | POA: Diagnosis not present

## 2019-07-12 DIAGNOSIS — Z7951 Long term (current) use of inhaled steroids: Secondary | ICD-10-CM | POA: Diagnosis not present

## 2019-07-12 DIAGNOSIS — K219 Gastro-esophageal reflux disease without esophagitis: Secondary | ICD-10-CM | POA: Insufficient documentation

## 2019-07-12 DIAGNOSIS — E785 Hyperlipidemia, unspecified: Secondary | ICD-10-CM | POA: Insufficient documentation

## 2019-07-12 DIAGNOSIS — K589 Irritable bowel syndrome without diarrhea: Secondary | ICD-10-CM | POA: Diagnosis not present

## 2019-07-12 DIAGNOSIS — Z01818 Encounter for other preprocedural examination: Secondary | ICD-10-CM | POA: Diagnosis not present

## 2019-07-12 DIAGNOSIS — M1712 Unilateral primary osteoarthritis, left knee: Secondary | ICD-10-CM | POA: Diagnosis not present

## 2019-07-12 DIAGNOSIS — I10 Essential (primary) hypertension: Secondary | ICD-10-CM | POA: Diagnosis not present

## 2019-07-12 DIAGNOSIS — E039 Hypothyroidism, unspecified: Secondary | ICD-10-CM | POA: Diagnosis not present

## 2019-07-12 DIAGNOSIS — Z7982 Long term (current) use of aspirin: Secondary | ICD-10-CM | POA: Insufficient documentation

## 2019-07-12 DIAGNOSIS — Z90710 Acquired absence of both cervix and uterus: Secondary | ICD-10-CM | POA: Insufficient documentation

## 2019-07-12 DIAGNOSIS — Z79899 Other long term (current) drug therapy: Secondary | ICD-10-CM | POA: Diagnosis not present

## 2019-07-12 DIAGNOSIS — Z01812 Encounter for preprocedural laboratory examination: Secondary | ICD-10-CM | POA: Insufficient documentation

## 2019-07-12 DIAGNOSIS — Z79890 Hormone replacement therapy (postmenopausal): Secondary | ICD-10-CM | POA: Diagnosis not present

## 2019-07-12 DIAGNOSIS — Z90721 Acquired absence of ovaries, unilateral: Secondary | ICD-10-CM | POA: Diagnosis not present

## 2019-07-12 DIAGNOSIS — I2510 Atherosclerotic heart disease of native coronary artery without angina pectoris: Secondary | ICD-10-CM | POA: Diagnosis not present

## 2019-07-12 HISTORY — DX: Other complications of anesthesia, initial encounter: T88.59XA

## 2019-07-12 HISTORY — DX: Nausea with vomiting, unspecified: R11.2

## 2019-07-12 HISTORY — DX: Pneumonia, unspecified organism: J18.9

## 2019-07-12 HISTORY — DX: Nausea with vomiting, unspecified: Z98.890

## 2019-07-12 HISTORY — DX: Unspecified osteoarthritis, unspecified site: M19.90

## 2019-07-12 LAB — COMPREHENSIVE METABOLIC PANEL
ALT: 12 U/L (ref 0–44)
AST: 23 U/L (ref 15–41)
Albumin: 3.8 g/dL (ref 3.5–5.0)
Alkaline Phosphatase: 106 U/L (ref 38–126)
Anion gap: 7 (ref 5–15)
BUN: 12 mg/dL (ref 8–23)
CO2: 27 mmol/L (ref 22–32)
Calcium: 9.1 mg/dL (ref 8.9–10.3)
Chloride: 108 mmol/L (ref 98–111)
Creatinine, Ser: 0.92 mg/dL (ref 0.44–1.00)
GFR calc Af Amer: >60 mL/min (ref >60)
GFR calc non Af Amer: >60 mL/min (ref >60)
Glucose, Bld: 102 mg/dL — ABNORMAL HIGH (ref 70–99)
Potassium: 4.1 mmol/L (ref 3.5–5.1)
Sodium: 142 mmol/L (ref 135–145)
Total Bilirubin: 1 mg/dL (ref 0.3–1.2)
Total Protein: 7.5 g/dL (ref 6.5–8.1)

## 2019-07-12 LAB — CBC
HCT: 38.9 % (ref 36.0–46.0)
Hemoglobin: 12.6 g/dL (ref 12.0–15.0)
MCH: 29.9 pg (ref 26.0–34.0)
MCHC: 32.4 g/dL (ref 30.0–36.0)
MCV: 92.2 fL (ref 80.0–100.0)
Platelets: 291 10*3/uL (ref 150–400)
RBC: 4.22 MIL/uL (ref 3.87–5.11)
RDW: 12.3 % (ref 11.5–15.5)
WBC: 6.4 10*3/uL (ref 4.0–10.5)
nRBC: 0 % (ref 0.0–0.2)

## 2019-07-12 LAB — PROTIME-INR
INR: 1 (ref 0.8–1.2)
Prothrombin Time: 13.1 seconds (ref 11.4–15.2)

## 2019-07-12 LAB — APTT: aPTT: 26 seconds (ref 24–36)

## 2019-07-12 LAB — SURGICAL PCR SCREEN
MRSA, PCR: NEGATIVE
Staphylococcus aureus: NEGATIVE

## 2019-07-12 NOTE — Progress Notes (Addendum)
PCP - Dr. Alyson Ingles Cardiologist - Dr. Marlou Porch LOV: 12/18/18. EPIC  Chest x-ray -  EKG - 01/18/19. Epic. Stress Test -  ECHO -  Cardiac Cath -   Sleep Study -  CPAP -   Fasting Blood Sugar -  Checks Blood Sugar _____ times a day  Blood Thinner Instructions: Aspirin Instructions:06/18/19 Last Dose:  Anesthesia review:   Patient denies shortness of breath, fever, cough and chest pain at PAT appointment   Patient verbalized understanding of instructions that were given to them at the PAT appointment. Patient was also instructed that they will need to review over the PAT instructions again at home before surgery.

## 2019-07-13 ENCOUNTER — Other Ambulatory Visit (HOSPITAL_COMMUNITY)
Admission: RE | Admit: 2019-07-13 | Discharge: 2019-07-13 | Disposition: A | Discharge status: Home / Self Care | Payer: Medicare Other | Source: Ambulatory Visit | Attending: Specialist | Admitting: Specialist

## 2019-07-13 DIAGNOSIS — Z20822 Contact with and (suspected) exposure to covid-19: Secondary | ICD-10-CM | POA: Diagnosis not present

## 2019-07-13 DIAGNOSIS — Z01812 Encounter for preprocedural laboratory examination: Secondary | ICD-10-CM | POA: Insufficient documentation

## 2019-07-13 LAB — SARS CORONAVIRUS 2 (TAT 6-24 HRS): SARS Coronavirus 2: NEGATIVE

## 2019-07-13 LAB — ABO/RH: ABO/RH(D): A POS

## 2019-07-14 NOTE — H&P (View-Only) (Signed)
Anesthesia Chart Review   Case: [id number] Date/Time: 07/16/19 0715   Procedure: TOTAL KNEE ARTHROPLASTY (Left Knee)   Anesthesia type: Spinal   Pre-op diagnosis: Left knee osteoarthritis   [facility]   Surgeons: Sydnee Cabal, MD      DISCUSSION:69 y.o. never smoker with h/o PONV, hypothyroidism, GERD, HTN, HLD, left knee OA scheduled for above procedure 07/16/19 with Dr. Sydnee Cabal.   Pt cleared by cardiology 03/11/2019.  Per Coletta Memos, NP, "Dominique Mooney 69 year old female with past medical history of essential hypertension, CAD, allergic rhinitis, mild persistent asthma, GERD, irritable bowel syndrome, pancreatitis, hypothyroidism, hyperlipidemia, constipation, mastodynia, and CP. Chart reviewed as part of pre-operative protocol coverage. Given past medical history and time since last visit, based on ACC/AHA guidelines, Dominique Mooney would be at acceptable risk for the planned procedure without further cardiovascular testing.  She has an RCRI class I risk, 0.4% risk of major cardiac event. She may hold her aspirin for 5 days prior to her left total knee arthroplasty and resume as soon as hemostasis has been achieved."  Anticipate pt can proceed with planned procedure barring acute status change.   VS: BP 140/84   Pulse 69   Temp 37.2 C (Oral)   Resp 16   Ht 5\' 1"  (1.549 m)   Wt 85.7 kg   SpO2 99%   BMI 35.71 kg/m   PROVIDERS: Fanny Skates, MD (Inactive)  Candee Furbish, MD is Cardiologist  LABS: Labs reviewed: Acceptable for surgery. (all labs ordered are listed, but only abnormal results are displayed)  Labs Reviewed  COMPREHENSIVE METABOLIC PANEL - Abnormal; Notable for the following components:      Result Value   Glucose, Bld 102 (*)    All other components within normal limits  SURGICAL PCR SCREEN  APTT  CBC  PROTIME-INR  TYPE AND SCREEN     IMAGES:   EKG: 01/18/2019 Rate 56 bpm Sinus bradycardia   CV: 06/26/2017  Cardiac Cath   First diagonal, 40% obstruction in the ostium and mid vessel.  LAD wraps around the left ventricular apex and contains diffuse 30% narrowing in the distal segment.  Circumflex is normal.  Right dominant anatomy with normal RCA  Normal left main  Normal left ventricular function and hemodynamics with EF 65% and EDP 9 mmHg  RECOMMENDATIONS:   No significant obstructive disease is noted.  Suggest aggressive risk factor modification to improve endothelial function and decrease the risk of progression.  Guideline directed targets would be LDL less than 70, A1c less than 7, tight blood pressure control, aerobic activity, and weight loss.  Would also be appropriate to determine if there is clinical suspicion of sleep apnea.  Echo 06/05/2017 Study Conclusions   - Left ventricle: The cavity size was normal. Wall thickness was  normal. Systolic function was normal. The estimated ejection  fraction was in the range of 60% to 65%. Wall motion was normal;  there were no regional wall motion abnormalities. Features are  consistent with a pseudonormal left ventricular filling pattern,  with concomitant abnormal relaxation and increased filling  pressure (grade 2 diastolic dysfunction).  Past Medical History:  Diagnosis Date  . Allergic rhinitis       . Arthritis   . Asthma       . Chest pressure    a. Normal Stress Echo 05/2010  . Complication of anesthesia   . GERD (gastroesophageal reflux disease)   . Hyperlipidemia   . Hypertension   .  Hypothyroidism   . Irritable bowel syndrome   . Pancreatitis    after ERCP  . Pneumonia   . PONV (postoperative nausea and vomiting)     Past Surgical History:  Procedure Laterality Date  . BRAVO [place] STUDY  05/02/2011   Procedure: BRAVO [place] STUDY;  Surgeon: Owens Loffler, MD;  [facility];  Service: Endoscopy;  Laterality: N/A;  48 hour wirless pH test (Bravo Test)  . CHOLECYSTECTOMY    .  ESOPHAGOGASTRODUODENOSCOPY  05/02/2011   Procedure: ESOPHAGOGASTRODUODENOSCOPY (EGD);  Surgeon: Owens Loffler, MD;  Location: [facility];  Service: Endoscopy;  Laterality: N/A;  . HERNIA REPAIR    . LEFT HEART CATH AND CORONARY ANGIOGRAPHY N/A 06/26/2017   Procedure: LEFT HEART CATH AND CORONARY ANGIOGRAPHY;  Surgeon: Belva Crome, MD;  Location: [place] [facility];  Service: Cardiovascular;  Laterality: N/A;  . MENISCUS REPAIR Left 03/18/2016   and repair of cracked bone  . SALPINGOOPHORECTOMY    . TOTAL ABDOMINAL HYSTERECTOMY      MEDICATIONS: . acetaminophen (TYLENOL) 500 MG tablet  . albuterol (PROVENTIL HFA) 108 (90 Base) MCG/ACT inhaler  . aspirin 81 MG tablet  . atorvastatin (LIPITOR) 80 MG tablet  . budesonide-formoterol (SYMBICORT) 80-4.5 MCG/ACT inhaler  . cetirizine (ZYRTEC) 10 MG tablet  . clonazePAM (KLONOPIN) 0.5 MG tablet  . esomeprazole (NEXIUM) 40 MG capsule  . fish oil-omega-3 fatty acids 1000 MG capsule  . fluticasone (FLONASE) 50 MCG/ACT nasal spray  . indomethacin (INDOCIN) 25 MG capsule  . metoprolol succinate (TOPROL-XL) 25 MG 24 hr tablet  . Multiple Vitamin (MULITIVITAMIN WITH MINERALS) TABS  . ranitidine (ZANTAC) 150 MG capsule  . SYNTHROID 88 MCG tablet   No current facility-administered medications for this encounter.    Maia Plan [facility] Pre-Surgical Testing [phone] 07/14/19  12:55 PM

## 2019-07-14 NOTE — Anesthesia Preprocedure Evaluation (Addendum)
Anesthesia Evaluation  Patient identified by MRN, date of birth, ID band Patient awake    Reviewed: Allergy & Precautions, Patient's Chart, lab work & pertinent test results, reviewed documented beta blocker date and time   History of Anesthesia Complications (+) PONV and history of anesthetic complications  Airway Mallampati: II  TM Distance: >3 FB Neck ROM: Full    Dental  (+) Missing,    Pulmonary asthma ,    Pulmonary exam normal        Cardiovascular hypertension, Pt. on medications and Pt. on home beta blockers + CAD  Normal cardiovascular exam     Neuro/Psych Anxiety negative neurological ROS     GI/Hepatic Neg liver ROS, GERD  Medicated and Controlled,  Endo/Other  Hypothyroidism   Renal/GU negative Renal ROS  negative genitourinary   Musculoskeletal  (+) Arthritis ,   Abdominal   Peds  Hematology negative hematology ROS (+)   Anesthesia Other Findings Day of surgery medications reviewed with patient.  Reproductive/Obstetrics negative OB ROS                           Anesthesia Physical Anesthesia Plan  ASA: II  Anesthesia Plan: Spinal   Post-op Pain Management:  Regional for Post-op pain   Induction:   PONV Risk Score and Plan: 4 or greater and Treatment may vary due to age or medical condition, Ondansetron, Propofol infusion, Dexamethasone and Midazolam  Airway Management Planned: Natural Airway and Simple Face Mask  Additional Equipment: None  Intra-op Plan:   Post-operative Plan:   Informed Consent: I have reviewed the patients History and Physical, chart, labs and discussed the procedure including the risks, benefits and alternatives for the proposed anesthesia with the patient or authorized representative who has indicated his/her understanding and acceptance.       Plan Discussed with: CRNA  Anesthesia Plan Comments: (See PAT note 07/12/2019, Konrad Felix, PA-C)      Anesthesia Quick Evaluation

## 2019-07-14 NOTE — Progress Notes (Signed)
Anesthesia Chart Review   Case: [id number] Date/Time: 07/16/19 0715   Procedure: TOTAL KNEE ARTHROPLASTY (Left Knee)   Anesthesia type: Spinal   Pre-op diagnosis: Left knee osteoarthritis   [facility]   Surgeons: Sydnee Cabal, MD      DISCUSSION:69 y.o. never smoker with h/o PONV, hypothyroidism, GERD, HTN, HLD, left knee OA scheduled for above procedure 07/16/19 with Dr. Sydnee Cabal.   Pt cleared by cardiology 03/11/2019.  Per Coletta Memos, NP, "[name] 69 year old female with past medical history of essential hypertension, CAD, allergic rhinitis, mild persistent asthma, GERD, irritable bowel syndrome, pancreatitis, hypothyroidism, hyperlipidemia, constipation, mastodynia, and CP. Chart reviewed as part of pre-operative protocol coverage. Given past medical history and time since last visit, based on ACC/AHA guidelines, HAYDEN VOZZELLA would be at acceptable risk for the planned procedure without further cardiovascular testing.  She has an RCRI class I risk, 0.4% risk of major cardiac event. She may hold her aspirin for 5 days prior to her left total knee arthroplasty and resume as soon as hemostasis has been achieved."  Anticipate pt can proceed with planned procedure barring acute status change.   VS: BP 140/84   Pulse 69   Temp 37.2 C (Oral)   Resp 16   Ht 5\' 1"  (1.549 m)   Wt 85.7 kg   SpO2 99%   BMI 35.71 kg/m   PROVIDERS: Fanny Skates, MD (Inactive)  Candee Furbish, MD is Cardiologist  LABS: Labs reviewed: Acceptable for surgery. (all labs ordered are listed, but only abnormal results are displayed)  Labs Reviewed  COMPREHENSIVE METABOLIC PANEL - Abnormal; Notable for the following components:      Result Value   Glucose, Bld 102 (*)    All other components within normal limits  SURGICAL PCR SCREEN  APTT  CBC  PROTIME-INR  TYPE AND SCREEN     IMAGES:   EKG: 01/18/2019 Rate 56 bpm Sinus bradycardia   CV: 06/26/2017  Cardiac Cath   First diagonal, 40% obstruction in the ostium and mid vessel.  LAD wraps around the left ventricular apex and contains diffuse 30% narrowing in the distal segment.  Circumflex is normal.  Right dominant anatomy with normal RCA  Normal left main  Normal left ventricular function and hemodynamics with EF 65% and EDP 9 mmHg  RECOMMENDATIONS:   No significant obstructive disease is noted.  Suggest aggressive risk factor modification to improve endothelial function and decrease the risk of progression.  Guideline directed targets would be LDL less than 70, A1c less than 7, tight blood pressure control, aerobic activity, and weight loss.  Would also be appropriate to determine if there is clinical suspicion of sleep apnea.  Echo 06/05/2017 Study Conclusions   - Left ventricle: The cavity size was normal. Wall thickness was  normal. Systolic function was normal. The estimated ejection  fraction was in the range of 60% to 65%. Wall motion was normal;  there were no regional wall motion abnormalities. Features are  consistent with a pseudonormal left ventricular filling pattern,  with concomitant abnormal relaxation and increased filling  pressure (grade 2 diastolic dysfunction).  Past Medical History:  Diagnosis Date  . Allergic rhinitis       . Arthritis   . Asthma       . Chest pressure    a. Normal Stress Echo 05/2010  . Complication of anesthesia   . GERD (gastroesophageal reflux disease)   . Hyperlipidemia   . Hypertension   .  Hypothyroidism   . Irritable bowel syndrome   . Pancreatitis    after ERCP  . Pneumonia   . PONV (postoperative nausea and vomiting)     Past Surgical History:  Procedure Laterality Date  . BRAVO [place] STUDY  05/02/2011   Procedure: BRAVO [place] STUDY;  Surgeon: Owens Loffler, MD;  [facility];  Service: Endoscopy;  Laterality: N/A;  48 hour wirless pH test (Bravo Test)  . CHOLECYSTECTOMY    .  ESOPHAGOGASTRODUODENOSCOPY  05/02/2011   Procedure: ESOPHAGOGASTRODUODENOSCOPY (EGD);  Surgeon: Owens Loffler, MD;  Location: [facility];  Service: Endoscopy;  Laterality: N/A;  . HERNIA REPAIR    . LEFT HEART CATH AND CORONARY ANGIOGRAPHY N/A 06/26/2017   Procedure: LEFT HEART CATH AND CORONARY ANGIOGRAPHY;  Surgeon: Belva Crome, MD;  Location: [place] [facility];  Service: Cardiovascular;  Laterality: N/A;  . MENISCUS REPAIR Left 03/18/2016   and repair of cracked bone  . SALPINGOOPHORECTOMY    . TOTAL ABDOMINAL HYSTERECTOMY      MEDICATIONS: . acetaminophen (TYLENOL) 500 MG tablet  . albuterol (PROVENTIL HFA) 108 (90 Base) MCG/ACT inhaler  . aspirin 81 MG tablet  . atorvastatin (LIPITOR) 80 MG tablet  . budesonide-formoterol (SYMBICORT) 80-4.5 MCG/ACT inhaler  . cetirizine (ZYRTEC) 10 MG tablet  . clonazePAM (KLONOPIN) 0.5 MG tablet  . esomeprazole (NEXIUM) 40 MG capsule  . fish oil-omega-3 fatty acids 1000 MG capsule  . fluticasone (FLONASE) 50 MCG/ACT nasal spray  . indomethacin (INDOCIN) 25 MG capsule  . metoprolol succinate (TOPROL-XL) 25 MG 24 hr tablet  . Multiple Vitamin (MULITIVITAMIN WITH MINERALS) TABS  . ranitidine (ZANTAC) 150 MG capsule  . SYNTHROID 88 MCG tablet   No current facility-administered medications for this encounter.    Maia Plan [facility] Pre-Surgical Testing [phone] 07/14/19  12:55 PM

## 2019-07-15 MED ORDER — BUPIVACAINE LIPOSOME 1.3 % IJ SUSP
20.0000 mL | INTRAMUSCULAR | Status: DC
  Filled 2019-07-15: qty 20

## 2019-07-16 ENCOUNTER — Ambulatory Visit (HOSPITAL_COMMUNITY): Payer: Medicare Other | Admitting: Anesthesiology

## 2019-07-16 ENCOUNTER — Encounter (HOSPITAL_COMMUNITY): Payer: Self-pay | Admitting: Specialist

## 2019-07-16 ENCOUNTER — Ambulatory Visit (HOSPITAL_COMMUNITY): Payer: Medicare Other | Admitting: Physician Assistant

## 2019-07-16 ENCOUNTER — Encounter (HOSPITAL_COMMUNITY)
Admission: RE | Disposition: A | Discharge status: Home Health | Payer: Self-pay | Source: Home / Self Care | Attending: Specialist

## 2019-07-16 ENCOUNTER — Inpatient Hospital Stay (HOSPITAL_COMMUNITY)
Admission: RE | Admit: 2019-07-16 | Discharge: 2019-07-19 | DRG: 470 | Disposition: A | Discharge status: Home Health | Payer: Medicare Other | Attending: Specialist | Admitting: Specialist

## 2019-07-16 DIAGNOSIS — Z7951 Long term (current) use of inhaled steroids: Secondary | ICD-10-CM

## 2019-07-16 DIAGNOSIS — Z8249 Family history of ischemic heart disease and other diseases of the circulatory system: Secondary | ICD-10-CM | POA: Diagnosis not present

## 2019-07-16 DIAGNOSIS — J4530 Mild persistent asthma, uncomplicated: Secondary | ICD-10-CM | POA: Diagnosis not present

## 2019-07-16 DIAGNOSIS — Z79899 Other long term (current) drug therapy: Secondary | ICD-10-CM | POA: Diagnosis not present

## 2019-07-16 DIAGNOSIS — M1712 Unilateral primary osteoarthritis, left knee: Secondary | ICD-10-CM | POA: Diagnosis not present

## 2019-07-16 DIAGNOSIS — Z823 Family history of stroke: Secondary | ICD-10-CM

## 2019-07-16 DIAGNOSIS — Z20822 Contact with and (suspected) exposure to covid-19: Secondary | ICD-10-CM | POA: Diagnosis not present

## 2019-07-16 DIAGNOSIS — I2510 Atherosclerotic heart disease of native coronary artery without angina pectoris: Secondary | ICD-10-CM | POA: Diagnosis not present

## 2019-07-16 DIAGNOSIS — I10 Essential (primary) hypertension: Secondary | ICD-10-CM | POA: Diagnosis present

## 2019-07-16 DIAGNOSIS — G8918 Other acute postprocedural pain: Secondary | ICD-10-CM | POA: Diagnosis not present

## 2019-07-16 DIAGNOSIS — E785 Hyperlipidemia, unspecified: Secondary | ICD-10-CM | POA: Diagnosis present

## 2019-07-16 DIAGNOSIS — Z8371 Family history of colonic polyps: Secondary | ICD-10-CM | POA: Diagnosis not present

## 2019-07-16 DIAGNOSIS — Z7982 Long term (current) use of aspirin: Secondary | ICD-10-CM | POA: Diagnosis not present

## 2019-07-16 DIAGNOSIS — Z90710 Acquired absence of both cervix and uterus: Secondary | ICD-10-CM

## 2019-07-16 DIAGNOSIS — K219 Gastro-esophageal reflux disease without esophagitis: Secondary | ICD-10-CM | POA: Diagnosis not present

## 2019-07-16 DIAGNOSIS — Z79890 Hormone replacement therapy (postmenopausal): Secondary | ICD-10-CM

## 2019-07-16 DIAGNOSIS — K589 Irritable bowel syndrome without diarrhea: Secondary | ICD-10-CM | POA: Diagnosis not present

## 2019-07-16 DIAGNOSIS — E039 Hypothyroidism, unspecified: Secondary | ICD-10-CM | POA: Diagnosis not present

## 2019-07-16 HISTORY — PX: TOTAL KNEE ARTHROPLASTY: SHX125

## 2019-07-16 LAB — CBC
HCT: 37 % (ref 36.0–46.0)
Hemoglobin: 11.8 g/dL — ABNORMAL LOW (ref 12.0–15.0)
MCH: 30 pg (ref 26.0–34.0)
MCHC: 31.9 g/dL (ref 30.0–36.0)
MCV: 94.1 fL (ref 80.0–100.0)
Platelets: 281 10*3/uL (ref 150–400)
RBC: 3.93 MIL/uL (ref 3.87–5.11)
RDW: 12.4 % (ref 11.5–15.5)
WBC: 10.3 10*3/uL (ref 4.0–10.5)
nRBC: 0 % (ref 0.0–0.2)

## 2019-07-16 LAB — TYPE AND SCREEN
ABO/RH(D): A POS
Antibody Screen: NEGATIVE

## 2019-07-16 LAB — CREATININE, SERUM
Creatinine, Ser: 0.92 mg/dL (ref 0.44–1.00)
GFR calc Af Amer: >60 mL/min (ref >60)
GFR calc non Af Amer: >60 mL/min (ref >60)

## 2019-07-16 LAB — HIV ANTIBODY (ROUTINE TESTING W REFLEX): HIV Screen 4th Generation wRfx: NONREACTIVE

## 2019-07-16 SURGERY — ARTHROPLASTY, KNEE, TOTAL
Anesthesia: Spinal | Site: Knee | Laterality: Left

## 2019-07-16 MED ORDER — FENTANYL CITRATE (PF) 100 MCG/2ML IJ SOLN
INTRAMUSCULAR | Status: AC
  Filled 2019-07-16: qty 2

## 2019-07-16 MED ORDER — ACETAMINOPHEN 500 MG PO TABS
1000.0000 mg | ORAL_TABLET | Freq: Four times a day (QID) | ORAL | Status: AC
  Administered 2019-07-16 – 2019-07-17 (×4): 1000 mg via ORAL
  Filled 2019-07-16 (×4): qty 2

## 2019-07-16 MED ORDER — LACTATED RINGERS IV SOLN: INTRAVENOUS | Status: DC

## 2019-07-16 MED ORDER — SODIUM CHLORIDE 0.9 % IV SOLN
INTRAVENOUS | Status: DC | PRN
  Administered 2019-07-16: 20 mL

## 2019-07-16 MED ORDER — PROPOFOL 500 MG/50ML IV EMUL
INTRAVENOUS | Status: AC
  Filled 2019-07-16: qty 50

## 2019-07-16 MED ORDER — HYDROMORPHONE HCL 1 MG/ML IJ SOLN
0.5000 mg | INTRAMUSCULAR | Status: DC | PRN
  Administered 2019-07-16: 1 mg via INTRAVENOUS
  Filled 2019-07-16: qty 1

## 2019-07-16 MED ORDER — METHOCARBAMOL 500 MG IVPB - SIMPLE MED
500.0000 mg | Freq: Four times a day (QID) | INTRAVENOUS | Status: DC | PRN
  Filled 2019-07-16: qty 50

## 2019-07-16 MED ORDER — METOPROLOL SUCCINATE ER 25 MG PO TB24
25.0000 mg | ORAL_TABLET | Freq: Every day | ORAL | Status: DC
  Administered 2019-07-17 – 2019-07-19 (×3): 25 mg via ORAL
  Filled 2019-07-16 (×3): qty 1

## 2019-07-16 MED ORDER — OXYCODONE HCL 5 MG PO TABS: 5.0000 mg | ORAL_TABLET | ORAL | 0 refills | Status: DC | PRN

## 2019-07-16 MED ORDER — OXYCODONE HCL 5 MG PO TABS
5.0000 mg | ORAL_TABLET | ORAL | Status: DC | PRN
  Administered 2019-07-17 – 2019-07-18 (×2): 10 mg via ORAL
  Filled 2019-07-16 (×4): qty 2

## 2019-07-16 MED ORDER — SODIUM CHLORIDE 0.9 % IV SOLN: INTRAVENOUS | Status: DC

## 2019-07-16 MED ORDER — ACETAMINOPHEN 325 MG PO TABS: 325.0000 mg | ORAL_TABLET | Freq: Four times a day (QID) | ORAL | Status: DC | PRN

## 2019-07-16 MED ORDER — PHENYLEPHRINE 40 MCG/ML (10ML) SYRINGE FOR IV PUSH (FOR BLOOD PRESSURE SUPPORT)
PREFILLED_SYRINGE | INTRAVENOUS | Status: DC | PRN
  Administered 2019-07-16 (×2): 80 ug via INTRAVENOUS

## 2019-07-16 MED ORDER — OXYCODONE HCL 5 MG/5ML PO SOLN: 5.0000 mg | Freq: Once | ORAL | Status: DC | PRN

## 2019-07-16 MED ORDER — ONDANSETRON HCL 4 MG/2ML IJ SOLN
4.0000 mg | Freq: Four times a day (QID) | INTRAMUSCULAR | Status: DC | PRN
  Administered 2019-07-16: 17:00:00 4 mg via INTRAVENOUS
  Filled 2019-07-16: qty 2

## 2019-07-16 MED ORDER — POVIDONE-IODINE 10 % EX SWAB
2.0000 "application " | Freq: Once | CUTANEOUS | Status: AC
  Administered 2019-07-16: 2 via TOPICAL

## 2019-07-16 MED ORDER — DIPHENHYDRAMINE HCL 12.5 MG/5ML PO ELIX: 12.5000 mg | ORAL_SOLUTION | ORAL | Status: DC | PRN

## 2019-07-16 MED ORDER — LEVOTHYROXINE SODIUM 88 MCG PO TABS
88.0000 ug | ORAL_TABLET | Freq: Every day | ORAL | Status: DC
  Administered 2019-07-17 – 2019-07-19 (×3): 88 ug via ORAL
  Filled 2019-07-16 (×4): qty 1

## 2019-07-16 MED ORDER — SENNOSIDES-DOCUSATE SODIUM 8.6-50 MG PO TABS: 1.0000 | ORAL_TABLET | Freq: Every evening | ORAL | Status: DC | PRN

## 2019-07-16 MED ORDER — PROPOFOL 500 MG/50ML IV EMUL
INTRAVENOUS | Status: DC | PRN
  Administered 2019-07-16: 40 ug/kg/min via INTRAVENOUS

## 2019-07-16 MED ORDER — BUPIVACAINE-EPINEPHRINE (PF) 0.5% -1:200000 IJ SOLN
INTRAMUSCULAR | Status: DC | PRN
  Administered 2019-07-16: 15 mL via PERINEURAL

## 2019-07-16 MED ORDER — MIDAZOLAM HCL 2 MG/2ML IJ SOLN
INTRAMUSCULAR | Status: AC
  Filled 2019-07-16: qty 2

## 2019-07-16 MED ORDER — 0.9 % SODIUM CHLORIDE (POUR BTL) OPTIME
TOPICAL | Status: DC | PRN
  Administered 2019-07-16: 1000 mL

## 2019-07-16 MED ORDER — CEFAZOLIN SODIUM-DEXTROSE 2-4 GM/100ML-% IV SOLN
2.0000 g | INTRAVENOUS | Status: AC
  Administered 2019-07-16: 2 g via INTRAVENOUS
  Filled 2019-07-16: qty 100

## 2019-07-16 MED ORDER — BISACODYL 5 MG PO TBEC: 5.0000 mg | DELAYED_RELEASE_TABLET | Freq: Every day | ORAL | Status: DC | PRN

## 2019-07-16 MED ORDER — CEFAZOLIN SODIUM-DEXTROSE 1-4 GM/50ML-% IV SOLN
1.0000 g | Freq: Three times a day (TID) | INTRAVENOUS | Status: AC
  Administered 2019-07-16 – 2019-07-17 (×3): 1 g via INTRAVENOUS
  Filled 2019-07-16 (×3): qty 50

## 2019-07-16 MED ORDER — DEXAMETHASONE SODIUM PHOSPHATE 10 MG/ML IJ SOLN
INTRAMUSCULAR | Status: AC
  Filled 2019-07-16: qty 1

## 2019-07-16 MED ORDER — EPHEDRINE SULFATE-NACL 50-0.9 MG/10ML-% IV SOSY
PREFILLED_SYRINGE | INTRAVENOUS | Status: DC | PRN
  Administered 2019-07-16 (×2): 5 mg via INTRAVENOUS

## 2019-07-16 MED ORDER — ONDANSETRON HCL 4 MG PO TABS: 4.0000 mg | ORAL_TABLET | Freq: Every day | ORAL | 1 refills | Status: DC | PRN

## 2019-07-16 MED ORDER — METHOCARBAMOL 500 MG PO TABS
500.0000 mg | ORAL_TABLET | Freq: Four times a day (QID) | ORAL | Status: DC | PRN
  Administered 2019-07-17 – 2019-07-19 (×2): 500 mg via ORAL
  Filled 2019-07-16 (×2): qty 1

## 2019-07-16 MED ORDER — CHLORHEXIDINE GLUCONATE 4 % EX LIQD: 60.0000 mL | Freq: Once | CUTANEOUS | Status: DC

## 2019-07-16 MED ORDER — FENTANYL CITRATE (PF) 100 MCG/2ML IJ SOLN
25.0000 ug | INTRAMUSCULAR | Status: DC | PRN
  Administered 2019-07-16: 50 ug via INTRAVENOUS

## 2019-07-16 MED ORDER — SODIUM CHLORIDE (PF) 0.9 % IJ SOLN
INTRAMUSCULAR | Status: DC | PRN
  Administered 2019-07-16: 60 mL

## 2019-07-16 MED ORDER — ONDANSETRON HCL 4 MG/2ML IJ SOLN
INTRAMUSCULAR | Status: AC
  Filled 2019-07-16: qty 2

## 2019-07-16 MED ORDER — METHOCARBAMOL 500 MG PO TABS: 500.0000 mg | ORAL_TABLET | Freq: Four times a day (QID) | ORAL | 0 refills | Status: DC

## 2019-07-16 MED ORDER — PROMETHAZINE HCL 25 MG/ML IJ SOLN: 6.2500 mg | INTRAMUSCULAR | Status: DC | PRN

## 2019-07-16 MED ORDER — ACETAMINOPHEN 500 MG PO TABS
1000.0000 mg | ORAL_TABLET | Freq: Once | ORAL | Status: AC
  Administered 2019-07-16: 06:00:00 1000 mg via ORAL
  Filled 2019-07-16: qty 2

## 2019-07-16 MED ORDER — MAGNESIUM CITRATE PO SOLN: 1.0000 | Freq: Once | ORAL | Status: DC | PRN

## 2019-07-16 MED ORDER — FENTANYL CITRATE (PF) 100 MCG/2ML IJ SOLN
INTRAMUSCULAR | Status: DC | PRN
  Administered 2019-07-16: 25 ug via INTRAVENOUS
  Administered 2019-07-16: 50 ug via INTRAVENOUS
  Administered 2019-07-16: 25 ug via INTRAVENOUS

## 2019-07-16 MED ORDER — METHOCARBAMOL 500 MG IVPB - SIMPLE MED
INTRAVENOUS | Status: AC
  Administered 2019-07-16: 11:00:00 500 mg via INTRAVENOUS
  Filled 2019-07-16: qty 50

## 2019-07-16 MED ORDER — CLONAZEPAM 0.5 MG PO TABS: 0.5000 mg | ORAL_TABLET | Freq: Every evening | ORAL | Status: DC | PRN

## 2019-07-16 MED ORDER — EPHEDRINE 5 MG/ML INJ
INTRAVENOUS | Status: AC
  Filled 2019-07-16: qty 10

## 2019-07-16 MED ORDER — PROPOFOL 10 MG/ML IV BOLUS
INTRAVENOUS | Status: AC
  Filled 2019-07-16: qty 20

## 2019-07-16 MED ORDER — ATORVASTATIN CALCIUM 40 MG PO TABS
80.0000 mg | ORAL_TABLET | Freq: Every day | ORAL | Status: DC
  Administered 2019-07-17 – 2019-07-19 (×3): 80 mg via ORAL
  Filled 2019-07-16 (×3): qty 2

## 2019-07-16 MED ORDER — PHENYLEPHRINE 40 MCG/ML (10ML) SYRINGE FOR IV PUSH (FOR BLOOD PRESSURE SUPPORT)
PREFILLED_SYRINGE | INTRAVENOUS | Status: AC
  Filled 2019-07-16: qty 10

## 2019-07-16 MED ORDER — IRRISEPT - 450ML BOTTLE WITH 0.05% CHG IN STERILE WATER, USP 99.95% OPTIME
TOPICAL | Status: DC | PRN
  Administered 2019-07-16: 225 mL

## 2019-07-16 MED ORDER — STERILE WATER FOR IRRIGATION IR SOLN
Status: DC | PRN
  Administered 2019-07-16: 2000 mL

## 2019-07-16 MED ORDER — PANTOPRAZOLE SODIUM 40 MG PO TBEC: 40.0000 mg | DELAYED_RELEASE_TABLET | Freq: Every day | ORAL | Status: DC

## 2019-07-16 MED ORDER — PROPOFOL 10 MG/ML IV BOLUS
INTRAVENOUS | Status: DC | PRN
  Administered 2019-07-16: 30 mg via INTRAVENOUS

## 2019-07-16 MED ORDER — ENOXAPARIN SODIUM 40 MG/0.4ML ~~LOC~~ SOLN
40.0000 mg | Freq: Two times a day (BID) | SUBCUTANEOUS | Status: DC
  Administered 2019-07-16 – 2019-07-19 (×6): 40 mg via SUBCUTANEOUS
  Filled 2019-07-16 (×6): qty 0.4

## 2019-07-16 MED ORDER — DEXAMETHASONE SODIUM PHOSPHATE 10 MG/ML IJ SOLN
8.0000 mg | Freq: Once | INTRAMUSCULAR | Status: AC
  Administered 2019-07-16: 08:00:00 8 mg via INTRAVENOUS

## 2019-07-16 MED ORDER — SODIUM CHLORIDE (PF) 0.9 % IJ SOLN
INTRAMUSCULAR | Status: AC
  Filled 2019-07-16: qty 10

## 2019-07-16 MED ORDER — TRANEXAMIC ACID-NACL 1000-0.7 MG/100ML-% IV SOLN
1000.0000 mg | INTRAVENOUS | Status: AC
  Administered 2019-07-16: 1000 mg via INTRAVENOUS
  Filled 2019-07-16: qty 100

## 2019-07-16 MED ORDER — MIDAZOLAM HCL 5 MG/5ML IJ SOLN
INTRAMUSCULAR | Status: DC | PRN
  Administered 2019-07-16: 2 mg via INTRAVENOUS

## 2019-07-16 MED ORDER — TRAMADOL HCL 50 MG PO TABS
50.0000 mg | ORAL_TABLET | Freq: Four times a day (QID) | ORAL | Status: DC
  Administered 2019-07-16 – 2019-07-19 (×12): 50 mg via ORAL
  Filled 2019-07-16 (×13): qty 1

## 2019-07-16 MED ORDER — GABAPENTIN 300 MG PO CAPS
300.0000 mg | ORAL_CAPSULE | Freq: Three times a day (TID) | ORAL | Status: DC
  Administered 2019-07-16 – 2019-07-19 (×9): 300 mg via ORAL
  Filled 2019-07-16 (×10): qty 1

## 2019-07-16 MED ORDER — BUPIVACAINE IN DEXTROSE 0.75-8.25 % IT SOLN
INTRATHECAL | Status: DC | PRN
  Administered 2019-07-16: 1.6 mL via INTRATHECAL

## 2019-07-16 MED ORDER — OXYCODONE HCL 5 MG PO TABS
10.0000 mg | ORAL_TABLET | ORAL | Status: DC | PRN
  Administered 2019-07-16 – 2019-07-17 (×3): 10 mg via ORAL
  Filled 2019-07-16: qty 2

## 2019-07-16 MED ORDER — SODIUM CHLORIDE 0.9 % IR SOLN
Status: DC | PRN
  Administered 2019-07-16: 1000 mL

## 2019-07-16 MED ORDER — OXYCODONE HCL 5 MG PO TABS: 5.0000 mg | ORAL_TABLET | Freq: Once | ORAL | Status: DC | PRN

## 2019-07-16 MED ORDER — CEFAZOLIN SODIUM-DEXTROSE 2-4 GM/100ML-% IV SOLN
INTRAVENOUS | Status: AC
  Filled 2019-07-16: qty 100

## 2019-07-16 MED ORDER — ONDANSETRON HCL 4 MG PO TABS
4.0000 mg | ORAL_TABLET | Freq: Four times a day (QID) | ORAL | Status: DC | PRN
  Administered 2019-07-17: 12:00:00 4 mg via ORAL
  Filled 2019-07-16: qty 1

## 2019-07-16 MED ORDER — SODIUM CHLORIDE (PF) 0.9 % IJ SOLN
INTRAMUSCULAR | Status: AC
  Filled 2019-07-16: qty 50

## 2019-07-16 MED ORDER — ONDANSETRON HCL 4 MG/2ML IJ SOLN
INTRAMUSCULAR | Status: DC | PRN
  Administered 2019-07-16: 4 mg via INTRAVENOUS

## 2019-07-16 SURGICAL SUPPLY — 75 items
ADH SKN CLS APL DERMABOND .7 (GAUZE/BANDAGES/DRESSINGS) ×1
ATTUNE MED DOME PAT 32 KNEE (Knees) ×1 IMPLANT
ATTUNE MED DOME PAT 32MM KNEE (Knees) ×1 IMPLANT
ATTUNE PSFEM LTSZ5 NARCEM KNEE (Femur) ×2 IMPLANT
ATTUNE PSRP INSR SZ 5 10M KNEE (Insert) ×2 IMPLANT
BAG DECANTER FOR FLEXI CONT (MISCELLANEOUS) IMPLANT
BAG SPEC THK2 15X12 ZIP CLS (MISCELLANEOUS) ×2
BAG ZIPLOCK 12X15 (MISCELLANEOUS) ×6 IMPLANT
BASE TIBIAL ROT PLAT SZ 5 KNEE (Knees) IMPLANT
BLADE SAG 18X100X1.27 (BLADE) ×3 IMPLANT
BLADE SAW SGTL 11.0X1.19X90.0M (BLADE) ×3 IMPLANT
BNDG CMPR MED 10X6 ELC LF (GAUZE/BANDAGES/DRESSINGS) ×1
BNDG ELASTIC 4X5.8 VLCR STR LF (GAUZE/BANDAGES/DRESSINGS) ×3 IMPLANT
BNDG ELASTIC 6X10 VLCR STRL LF (GAUZE/BANDAGES/DRESSINGS) ×2 IMPLANT
BNDG ELASTIC 6X5.8 VLCR STR LF (GAUZE/BANDAGES/DRESSINGS) ×3 IMPLANT
BOWL SMART MIX CTS (DISPOSABLE) ×3 IMPLANT
BSPLAT TIB 5 CMNT ROT PLAT STR (Knees) ×1 IMPLANT
CEMENT HV SMART SET (Cement) ×4 IMPLANT
COVER SURGICAL LIGHT HANDLE (MISCELLANEOUS) ×3 IMPLANT
COVER WAND RF STERILE (DRAPES) ×2 IMPLANT
CUFF TOURN SGL QUICK 34 (TOURNIQUET CUFF) ×3
CUFF TRNQT CYL 34X4.125X (TOURNIQUET CUFF) ×1 IMPLANT
DECANTER SPIKE VIAL GLASS SM (MISCELLANEOUS) ×1 IMPLANT
DERMABOND ADVANCED (GAUZE/BANDAGES/DRESSINGS) ×2
DERMABOND ADVANCED .7 DNX12 (GAUZE/BANDAGES/DRESSINGS) ×1 IMPLANT
DRAPE U-SHAPE 47X51 STRL (DRAPES) ×3 IMPLANT
DRESSING AQUACEL AG SP 3.5X10 (GAUZE/BANDAGES/DRESSINGS) IMPLANT
DRSG AQUACEL AG ADV 3.5X10 (GAUZE/BANDAGES/DRESSINGS) ×3 IMPLANT
DRSG AQUACEL AG SP 3.5X10 (GAUZE/BANDAGES/DRESSINGS) ×3
DRSG TEGADERM 4X4.75 (GAUZE/BANDAGES/DRESSINGS) ×3 IMPLANT
DURAPREP 26ML APPLICATOR (WOUND CARE) ×6 IMPLANT
ELECT REM PT RETURN 15FT ADLT (MISCELLANEOUS) ×3 IMPLANT
EVACUATOR 1/8 PVC DRAIN (DRAIN) ×3 IMPLANT
GAUZE SPONGE 2X2 8PLY STRL LF (GAUZE/BANDAGES/DRESSINGS) ×1 IMPLANT
GLOVE BIOGEL PI IND STRL 7.5 (GLOVE) ×1 IMPLANT
GLOVE BIOGEL PI IND STRL 8 (GLOVE) ×1 IMPLANT
GLOVE BIOGEL PI INDICATOR 7.5 (GLOVE) ×2
GLOVE BIOGEL PI INDICATOR 8 (GLOVE) ×2
GLOVE ECLIPSE 8.0 STRL XLNG CF (GLOVE) ×3 IMPLANT
GLOVE SURG ORTHO 9.0 STRL STRW (GLOVE) ×3 IMPLANT
GLOVE SURG SS PI 7.0 STRL IVOR (GLOVE) ×3 IMPLANT
GOWN STRL REUS W/TWL XL LVL3 (GOWN DISPOSABLE) ×6 IMPLANT
HANDPIECE INTERPULSE COAX TIP (DISPOSABLE) ×3
HOLDER FOLEY CATH W/STRAP (MISCELLANEOUS) IMPLANT
JET LAVAGE IRRISEPT WOUND (IRRIGATION / IRRIGATOR) ×3
KIT TURNOVER KIT A (KITS) IMPLANT
LAVAGE JET IRRISEPT WOUND (IRRIGATION / IRRIGATOR) ×1 IMPLANT
NS IRRIG 1000ML POUR BTL (IV SOLUTION) ×3 IMPLANT
PACK TOTAL KNEE CUSTOM (KITS) ×3 IMPLANT
PENCIL SMOKE EVACUATOR (MISCELLANEOUS) ×2 IMPLANT
PIN DRILL FIX HALF THREAD (BIT) ×2 IMPLANT
PIN STEINMAN FIXATION KNEE (PIN) ×2 IMPLANT
PROTECTOR NERVE ULNAR (MISCELLANEOUS) ×3 IMPLANT
SET HNDPC FAN SPRY TIP SCT (DISPOSABLE) ×1 IMPLANT
SET PAD KNEE POSITIONER (MISCELLANEOUS) ×3 IMPLANT
SPONGE GAUZE 2X2 STER 10/PKG (GAUZE/BANDAGES/DRESSINGS) ×2
SPONGE LAP 18X18 RF (DISPOSABLE) IMPLANT
SPONGE SURGIFOAM ABS GEL 100 (HEMOSTASIS) ×3 IMPLANT
STOCKINETTE 6  STRL (DRAPES) ×2
STOCKINETTE 6 STRL (DRAPES) ×1 IMPLANT
SUT BONE WAX W31G (SUTURE) IMPLANT
SUT MNCRL AB 3-0 PS2 18 (SUTURE) ×3 IMPLANT
SUT VIC AB 0 CT1 36 (SUTURE) ×2 IMPLANT
SUT VIC AB 1 CT1 27 (SUTURE) ×9
SUT VIC AB 1 CT1 27XBRD ANTBC (SUTURE) ×3 IMPLANT
SUT VIC AB 2-0 CT1 27 (SUTURE) ×6
SUT VIC AB 2-0 CT1 TAPERPNT 27 (SUTURE) ×2 IMPLANT
SUT VLOC 180 0 24IN GS25 (SUTURE) ×3 IMPLANT
SYR 50ML LL SCALE MARK (SYRINGE) IMPLANT
TAPE STRIPS DRAPE STRL (GAUZE/BANDAGES/DRESSINGS) ×3 IMPLANT
TIBIAL BASE ROT PLAT SZ 5 KNEE (Knees) ×3 IMPLANT
TRAY FOLEY MTR SLVR 16FR STAT (SET/KITS/TRAYS/PACK) ×3 IMPLANT
WATER STERILE IRR 1000ML POUR (IV SOLUTION) ×6 IMPLANT
WRAP KNEE MAXI GEL POST OP (GAUZE/BANDAGES/DRESSINGS) ×3 IMPLANT
YANKAUER SUCT BULB TIP 10FT TU (MISCELLANEOUS) ×3 IMPLANT

## 2019-07-16 NOTE — Op Note (Signed)
DATE OF SURGERY:  07/16/2019  TIME: 10:07 AM  PATIENT NAME:  Dominique Mooney    AGE: 69 y.o.   PRE-OPERATIVE DIAGNOSIS:  Left knee osteoarthritis  POST-OPERATIVE DIAGNOSIS:  Left knee osteoarthritis  PROCEDURE:  Procedure(s): TOTAL KNEE ARTHROPLASTY  SURGEON:  Acey Woodfield ANDREW  ASSISTANT:  Leeanne Haus, PA-C, present and scrubbed throughout the case, critical for assistance with exposure, retraction, instrumentation, and closure.  OPERATIVE IMPLANTS: Depuy PFC Attune Rotating Platform.  Femur size 5, Tibia size 5, Patella size 32 3-peg oval button, with a 10 mm polyethylene insert.   PREOPERATIVE INDICATIONS:   Dominique Mooney is a 69 y.o. year old female with end stage bone on bone arthritis of the knee who failed conservative treatment and elected for Total Knee Arthroplasty.   The risks, benefits, and alternatives were discussed at length including but not limited to the risks of infection, bleeding, nerve injury, stiffness, blood clots, the need for revision surgery, cardiopulmonary complications, among others, and they were willing to proceed.  OPERATIVE DESCRIPTION:  The patient was brought to the operative room and placed in a supine position.  Spinal anesthesia was administered.  IV antibiotics were given.  The lower extremity was prepped and draped in the usual sterile fashion.  Time out was performed.  The leg was elevated and exsanguinated and the tourniquet was inflated.  Anterior quadriceps tendon splitting approach was performed.  The patella was retracted and osteophytes were removed.  The anterior horn of the medial and lateral meniscus was removed and cruciate ligaments resected.   The distal femur was opened with the drill and the intramedullary distal femoral cutting jig was utilized, set at 5 degrees resecting 10 mm off the distal femur.  Care was taken to protect the collateral ligaments.  The distal femoral sizing jig was applied, taking care to avoid  notching.  Then the 4-in-1 cutting jig was applied and the anterior and posterior femur was cut, along with the chamfer cuts.    Then the extramedullary tibial cutting jig was utilized making the appropriate cut using the anterior tibial crest as a reference building in appropriate posterior slope.  Care was taken during the cut to protect the medial and collateral ligaments.  The proximal tibia was removed along with the posterior horns of the menisci.   The posterior medial femoral osteophytes and posterior lateral femoral osteophytes were removed.    The flexion gap was then measured and was symmetric with the extension gap, measured at 10.  I completed the distal femoral preparation using the appropriate jig to prepare the box.  The patella was then measured, and cut with the saw.    The proximal tibia sized and prepared accordingly with the reamer and the punch, and then all components were trialed with the trial insert.  The knee was found to have excellent balance and full motion.    The above named components were then cemented into place and all excess cement was removed.  The trial polyethylene component was in place during cementation, and then was exchanged for the real polyethylene component.    The knee was easily taken through a range of motion and the patella tracked well and the knee irrigated copiously and the parapatellar and subcutaneous tissue closed with vicryl, and monocryl with steri strips for the skin.  The arthrotomy was closed at 90 of flexion. The wounds were dressed with sterile gauze and the tourniquet released and the patient was awakened and returned to the PACU in  stable and satisfactory condition.  There were no complications.  Total tourniquet time was 90 minutes.

## 2019-07-16 NOTE — Interval H&P Note (Signed)
History and Physical Interval Note:  07/16/2019 7:35 AM  Dominique Mooney  has presented today for surgery, with the diagnosis of Left knee osteoarthritis.  The various methods of treatment have been discussed with the patient and family. After consideration of risks, benefits and other options for treatment, the patient has consented to  Procedure(s): TOTAL KNEE ARTHROPLASTY (Left) as a surgical intervention.  The patient's history has been reviewed, patient examined, no change in status, stable for surgery.  I have reviewed the patient's chart and labs.  Questions were answered to the patient's satisfaction.     Dominique Mooney ANDREW

## 2019-07-16 NOTE — Anesthesia Procedure Notes (Signed)
Anesthesia Regional Block: Adductor canal block   Pre-Anesthetic Checklist: ,, timeout performed, Correct Patient, Correct Site, Correct Laterality, Correct Procedure, Correct Position, site marked, Risks and benefits discussed, pre-op evaluation,  At surgeon's request and post-op pain management  Laterality: Left  Prep: Maximum Sterile Barrier Precautions used, chloraprep       Needles:  Injection technique: Single-shot  Needle Type: Echogenic Stimulator Needle     Needle Length: 9cm  Needle Gauge: 22     Additional Needles:   Procedures:,,,, ultrasound used (permanent image in chart),,,,  Narrative:  Start time: 07/16/2019 7:07 AM End time: 07/16/2019 7:10 AM Injection made incrementally with aspirations every 5 mL.  Performed by: Personally  Anesthesiologist: Brennan Bailey, MD  Additional Notes: Risks, benefits, and alternative discussed. Patient gave consent for procedure. Patient prepped and draped in sterile fashion. Sedation administered, patient remains easily responsive to voice. Relevant anatomy identified with ultrasound guidance. Local anesthetic given in 5cc increments with no signs or symptoms of intravascular injection. No pain or paraesthesias with injection. Patient monitored throughout procedure with signs of LAST or immediate complications. Tolerated well. Ultrasound image placed in chart.  Tawny Asal, MD

## 2019-07-16 NOTE — Evaluation (Signed)
Physical Therapy Evaluation Patient Details Name: Dominique Mooney MRN: [id number] DOB: 1951-03-19 Today's Date: 07/16/2019   History of Present Illness  Patient is 69 y.o. female s/p Lt TKA on 07/16/19 with PMH significant for IBS, hypothyroidism, HTN, HLD, GERD, Asthma, OA, pancreatitis, Lt heart cath in 2019.    Clinical Impression  Dominique Mooney is a 69 y.o. female POD 0 s/p Lt TKA. Patient reports independence with mobility at baseline. Patient is now limited by functional impairments (see PT problem list below) and requires min-mod assist for bed mobility. Patient was limited during evaluation by c/o nausea and overheated sensation in sitting EOB; unable to progress to transfers and gait. Patient will benefit from continued skilled PT interventions to address impairments and progress towards PLOF. Acute PT will follow to progress mobility and stair training in preparation for safe discharge home.     Follow Up Recommendations Follow surgeon's recommendation for DC plan and follow-up therapies    Equipment Recommendations  Rolling walker with 5" wheels;3in1 (PT)(youth RW)    Recommendations for Other Services       Precautions / Restrictions Precautions Precautions: Fall Restrictions Weight Bearing Restrictions: No      Mobility  Bed Mobility Overal bed mobility: Needs Assistance Bed Mobility: Supine to Sit;Sit to Supine     Supine to sit: HOB elevated;Min assist Sit to supine: HOB elevated;Mod assist;+2 for safety/equipment;+2 for physical assistance   General bed mobility comments: min assist for Lt LE mobility and cues to use bed rail to sit up. pt c/o nausea after sitting up to EOB, BP was 143/92 and HR 66. Patient required mod assist +2 to return to supine.  Transfers        General transfer comment: deferred due to nausea  Ambulation/Gait       Stairs     Wheelchair Mobility    Modified Rankin (Stroke Patients Only)       Balance Overall balance  assessment: Needs assistance Sitting-balance support: Feet supported Sitting balance-Leahy Scale: Good            Pertinent Vitals/Pain Pain Assessment: 0-10 Pain Score: 6  Pain Location: Lt knee Pain Descriptors / Indicators: Aching Pain Intervention(s): Limited activity within patient's tolerance;Monitored during session;Repositioned;Ice applied    Home Living Family/patient expects to be discharged to:: Private residence Living Arrangements: Spouse/significant other Available Help at Discharge: Family;Available 24 hours/day Type of Home: House Home Access: Stairs to enter   [employer] of Steps: 2 Home Layout: One level Home Equipment: Grab bars - tub/shower;Cane - single point      Prior Function Level of Independence: Independent          Hand Dominance   Dominant Hand: Right    Extremity/Trunk Assessment   Upper Extremity Assessment Upper Extremity Assessment: Overall WFL for tasks assessed    Lower Extremity Assessment Lower Extremity Assessment: LLE deficits/detail LLE Deficits / Details: pt with poor quad activation 3-/5, unable to complete SLR or hold LAQ to light resistance LLE: Unable to fully assess due to pain LLE Sensation: WNL LLE Coordination: WNL    Cervical / Trunk Assessment Cervical / Trunk Assessment: Normal  Communication   Communication: No difficulties  Cognition Arousal/Alertness: Awake/alert Behavior During Therapy: WFL for tasks assessed/performed Overall Cognitive Status: Within Functional Limits for tasks assessed                    Assessment/Plan    PT Assessment Patient needs continued PT services  PT Problem  List Decreased range of motion;Decreased strength;Decreased balance;Decreased mobility;Decreased activity tolerance;Decreased knowledge of use of DME       PT Treatment Interventions DME instruction;Functional mobility training;Balance training;Patient/family education;Gait training;Therapeutic  activities;Therapeutic exercise;Stair training    PT Goals (Current goals can be found in the Care Plan section)  Acute Rehab PT Goals Patient Stated Goal: none stated, pt c/o nausea PT Goal Formulation: With patient Time For Goal Achievement: 07/23/19 Potential to Achieve Goals: Good    Frequency 7X/week    AM-PAC PT "6 Clicks" Mobility  Outcome Measure Help needed turning from your back to your side while in a flat bed without using bedrails?: A Little Help needed moving from lying on your back to sitting on the side of a flat bed without using bedrails?: A Little Help needed moving to and from a bed to a chair (including a wheelchair)?: A Lot Help needed standing up from a chair using your arms (e.g., wheelchair or bedside chair)?: A Lot Help needed to walk in hospital room?: A Lot Help needed climbing 3-5 steps with a railing? : A Lot 6 Click Score: 14    End of Session Equipment Utilized During Treatment: Gait belt Activity Tolerance: Patient tolerated treatment well Patient left: with call bell/phone within reach;in bed;with bed alarm set;with family/visitor present Nurse Communication: Mobility status PT Visit Diagnosis: Muscle weakness (generalized) (M62.81);Difficulty in walking, not elsewhere classified (R26.2)    Time: [id number] PT Time Calculation (min) (ACUTE ONLY): 15 min   Charges:   PT Evaluation $PT Eval Low Complexity: 1 Low         Verner Mould, DPT Physical Therapist with [facility] [phone]  07/16/2019 5:21 PM

## 2019-07-16 NOTE — Anesthesia Procedure Notes (Signed)
Spinal  Start time: 07/16/2019 7:44 AM End time: 07/16/2019 7:47 AM Staffing Resident/CRNA: Victoriano Lain, CRNA Preanesthetic Checklist Completed: patient identified, IV checked, site marked, risks and benefits discussed, surgical consent, monitors and equipment checked, pre-op evaluation and timeout performed Spinal Block Patient position: sitting Prep: DuraPrep and site prepped and draped Patient monitoring: continuous pulse ox, blood pressure and heart rate Approach: midline Location: L3-4 Injection technique: single-shot Needle Needle type: Pencan  Needle gauge: 24 G Needle length: 10 cm Assessment Sensory level: T4 Additional Notes Pt placed in sitting position. Spinal kit expiration date checked and verified. One attempt by CRNA. + CSF, - heme. Pt tolerated well.

## 2019-07-16 NOTE — Interval H&P Note (Signed)
History and Physical Interval Note:  07/16/2019 7:35 AM  Dominique Mooney  has presented today for surgery, with the diagnosis of Left knee osteoarthritis.  The various methods of treatment have been discussed with the patient and family. After consideration of risks, benefits and other options for treatment, the patient has consented to  Procedure(s): TOTAL KNEE ARTHROPLASTY (Left) as a surgical intervention.  The patient's history has been reviewed, patient examined, no change in status, stable for surgery.  I have reviewed the patient's chart and labs.  Questions were answered to the patient's satisfaction.     Devan Danzer ANDREW

## 2019-07-16 NOTE — Transfer of Care (Signed)
Immediate Anesthesia Transfer of Care Note  Patient: FALON HUESCA  Procedure(s) Performed: TOTAL KNEE ARTHROPLASTY (Left Knee)  Patient Location: PACU  Anesthesia Type:MAC and Spinal  Level of Consciousness: awake, alert , oriented and patient cooperative  Airway & Oxygen Therapy: Patient Spontanous Breathing and Patient connected to face mask oxygen  Post-op Assessment: Report given to RN and Post -op Vital signs reviewed and stable  Post vital signs: Reviewed and stable  Last Vitals:  Vitals Value Taken Time  BP 115/65 07/16/19 1021  Temp    Pulse    Resp 11 07/16/19 1022  SpO2    Vitals shown include unvalidated device data.  Last Pain:  Vitals:   07/16/19 0620  TempSrc:   PainSc: 0-No pain         Complications: No apparent anesthesia complications

## 2019-07-16 NOTE — Anesthesia Postprocedure Evaluation (Signed)
Anesthesia Post Note  Patient: Dominique Mooney  Procedure(s) Performed: TOTAL KNEE ARTHROPLASTY (Left Knee)     Patient location during evaluation: PACU Anesthesia Type: Spinal Level of consciousness: awake and alert and oriented Pain management: pain level controlled Vital Signs Assessment: post-procedure vital signs reviewed and stable Respiratory status: spontaneous breathing, nonlabored ventilation and respiratory function stable Cardiovascular status: blood pressure returned to baseline Postop Assessment: no apparent nausea or vomiting and spinal receding Anesthetic complications: no              Brennan Bailey

## 2019-07-17 ENCOUNTER — Other Ambulatory Visit: Payer: Self-pay

## 2019-07-17 NOTE — TOC Progression Note (Signed)
Transition of Care [facility]) - Progression Note    Patient Details  Name: Dominique Mooney MRN: [id number] Date of Birth: 05-04-1951  [facility] CM/SW Contact  Joaquin Courts, RN Phone Number: 07/17/2019, 2:28 PM  Clinical Narrative:    CM received notification that patient is reporting she does not have rolling walker or 3in1 as initially reported.  Cm spoke with patient and confirmed this. CM delivered equipment to bedside.    Expected Discharge Plan: [place] Barriers to Discharge: No Barriers Identified  Expected Discharge Plan and Services Expected Discharge Plan: [place]   Discharge Planning Services: CM Consult Post Acute Care Choice: [place] arrangements for the past 2 months: Single Family Home Expected Discharge Date: 07/17/19               DME Arranged: Gilford Rile rolling, 3-N-1 DME Agency: [employer] Date DME Agency Contacted: 07/17/19 Time DME Agency Contacted: [phone] Representative spoke with at DME Agency: Cm delivered equipment and left referral info for rep HH Arranged: PT [place]: [facility] [employer] Date [place]: 07/17/19 Time [place]: [place] Representative spoke with at [place]: [place] ([place]) Interventions    Readmission Risk Interventions No flowsheet data found.

## 2019-07-17 NOTE — Plan of Care (Signed)
Patient gets weak after therapy.  Also nauseated at times.  Medicated as ordered.

## 2019-07-17 NOTE — Progress Notes (Signed)
Physical Therapy Treatment Patient Details Name: Dominique Mooney MRN: [id number] DOB: 28-Dec-1950 Today's Date: 07/17/2019    History of Present Illness 69 y.o. female s/p Lt TKA on 07/16/19 with PMH significant for IBS, hypothyroidism, HTN, HLD, GERD, Asthma, OA, pancreatitis, Lt heart cath in 2019.    PT Comments    Mobility remains limited by nausea/vomiting. Pt only able to tolerate taking a few ambulatory steps this p.m. before becoming nauseous and vomiting. Bladder incontinence occurred during episode. Assisted pt back to bed.  Will continue to progress activity as pt is able to safely tolerate.    Follow Up Recommendations  Follow surgeon's recommendation for DC plan and follow-up therapies     Equipment Recommendations  Rolling walker with 5" wheels;3in1; (youth height RW)    Recommendations for Other Services       Precautions / Restrictions Precautions Precautions: Fall Precaution Comments: nausea Restrictions Weight Bearing Restrictions: No Other Position/Activity Restrictions: WBAT    Mobility  Bed Mobility Overal bed mobility: Needs Assistance Bed Mobility: Sit to Supine     Supine to sit: Min guard;HOB elevated Sit to supine: Min assist   General bed mobility comments: Assist for L LE.  Transfers Overall transfer level: Needs assistance Equipment used: Rolling walker (2 wheeled) Transfers: Sit to/from Stand Sit to Stand: Min assist Stand pivot transfers: Min assist       General transfer comment: Assist to rise, stabilize, control descent. VCs safety, technique, hand/LE placement. Increased time.  Ambulation/Gait Ambulation/Gait assistance: Min assist; +2 safety (chair follow) Gait Distance (Feet): 5 Feet Assistive device: Rolling walker (2 wheeled)       General Gait Details: VCs safety, technique, sequence. Pt took a few steps towards the door then stated "I have to sit down." Pt vomited shortly after. Deferred further ambulation.   Stairs              Wheelchair Mobility    Modified Rankin (Stroke Patients Only)       Balance Overall balance assessment: Needs assistance         Standing balance support: Bilateral upper extremity supported Standing balance-Leahy Scale: Poor                              Cognition Arousal/Alertness: Awake/alert Behavior During Therapy: WFL for tasks assessed/performed Overall Cognitive Status: Within Functional Limits for tasks assessed                                        Exercises     General Comments        Pertinent Vitals/Pain Pain Assessment: 0-10 Pain Score: 7  Pain Location: L knee Pain Descriptors / Indicators: Aching;Sore;Discomfort Pain Intervention(s): Monitored during session;Repositioned    Home Living                      Prior Function            PT Goals (current goals can now be found in the care plan section) Progress towards PT goals: Progressing toward goals    Frequency    7X/week      PT Plan Current plan remains appropriate    Co-evaluation              AM-PAC PT "6 Clicks" Mobility   Outcome Measure  Help needed turning from  your back to your side while in a flat bed without using bedrails?: A Little Help needed moving from lying on your back to sitting on the side of a flat bed without using bedrails?: A Little Help needed moving to and from a bed to a chair (including a wheelchair)?: A Little Help needed standing up from a chair using your arms (e.g., wheelchair or bedside chair)?: A Little Help needed to walk in hospital room?: A Little Help needed climbing 3-5 steps with a railing? : A Lot 6 Click Score: 17    End of Session Equipment Utilized During Treatment: Gait belt Activity Tolerance: (limited by nausea) Patient left: in bed;with call bell/phone within reach   PT Visit Diagnosis: Other abnormalities of gait and mobility (R26.89);Pain Pain - Right/Left:  Left Pain - part of body: Knee     Time: 1355-1415 PT Time Calculation (min) (ACUTE ONLY): 20 min  Charges:  $Gait Training: 8-22 mins $Therapeutic Exercise: 8-22 mins                         Doreatha Massed, PT Acute Rehabilitation

## 2019-07-17 NOTE — Plan of Care (Signed)
  Problem: Coping: Goal: Level of anxiety will decrease Outcome: Progressing   Problem: Activity: Goal: Risk for activity intolerance will decrease Outcome: Progressing   Problem: Nutrition: Goal: Adequate nutrition will be maintained Outcome: Progressing   Problem: Skin Integrity: Goal: Risk for impaired skin integrity will decrease Outcome: Progressing   Problem: Safety: Goal: Ability to remain free from injury will improve Outcome: Progressing

## 2019-07-17 NOTE — Plan of Care (Signed)

## 2019-07-17 NOTE — Progress Notes (Signed)
   Subjective:  Patient reports pain as mild to moderate.  Also complaining of some pain along the dorsum of the left foot.  This is not changed with weightbearing.  She was unable to get up with therapy yesterday due to pain and feeling lightheaded.  She continues with nasal cannula this morning.  No nausea or vomiting.  No chest pain.  Objective:   VITALS:   Vitals:   07/16/19 1405 07/16/19 2142 07/17/19 0118 07/17/19 0619  BP: 135/87 136/86 123/78 115/64  Pulse: 63 (!) 57 (!) 55 (!) 56  Resp: 16 18 16 16   Temp: 97.7 F (36.5 C) 98 F (36.7 C) 97.8 F (36.6 C) 97.7 F (36.5 C)  TempSrc: Oral Oral Oral Oral  SpO2: 100% 100% 100% 100%  Weight:      Height:        Neurologically intact Neurovascular intact Sensation intact distally Intact pulses distally Dorsiflexion/Plantar flexion intact Incision: dressing C/D/I, no drainage and Hemovac drain pulled Compartment soft   Lab Results  Component Value Date   WBC 10.3 07/16/2019   HGB 11.8 (L) 07/16/2019   HCT 37.0 07/16/2019   MCV 94.1 07/16/2019   PLT 281 07/16/2019   BMET    Component Value Date/Time   NA 142 07/12/2019 1224   NA 142 01/14/2019 0807   K 4.1 07/12/2019 1224   CL 108 07/12/2019 1224   CO2 27 07/12/2019 1224   GLUCOSE 102 (H) 07/12/2019 1224   BUN 12 07/12/2019 1224   BUN 9 01/14/2019 0807   CREATININE 0.92 07/16/2019 1048   CALCIUM 9.1 07/12/2019 1224   GFRNONAA >60 07/16/2019 1048   GFRAA >60 07/16/2019 1048     Assessment/Plan: 1 Day Post-Op   Active Problems:   Osteoarthritis of left knee   Advance diet Up with therapy  -Weightbearing as tolerated to left lower extremity.  -Lovenox and SCDs while inpatient for DVT prophylaxis.  -Anticipate left foot pain is due to the foot holder at time of surgery.  If she has persistent pain or increased pain with weightbearing we will get x-rays.  - possible dc today, likely tomorrow given she has not been out of bed yet   Nicholes Stairs 07/17/2019, 8:59 AM   Geralynn Rile, MD [phone]

## 2019-07-17 NOTE — Progress Notes (Signed)
Physical Therapy Treatment Patient Details Name: Dominique Mooney MRN: [id number] DOB: 1950-05-27 Today's Date: 07/17/2019    History of Present Illness 69 y.o. female s/p Lt TKA on 07/16/19 with PMH significant for IBS, hypothyroidism, HTN, HLD, GERD, Asthma, OA, pancreatitis, Lt heart cath in 2019.    PT Comments    Mobility limited by nausea. Pt was only able to tolerate getting to recliner this a.m. Will continue to follow and progress activity as tolerated. Pt will likely not d/c home today.    Follow Up Recommendations  Follow surgeon's recommendation for DC plan and follow-up therapies     Equipment Recommendations  Rolling walker with 5" wheels;3in1 (PT)(youth height)    Recommendations for Other Services       Precautions / Restrictions Precautions Precautions: Fall Precaution Comments: nausea Restrictions Weight Bearing Restrictions: No    Mobility  Bed Mobility Overal bed mobility: Needs Assistance Bed Mobility: Supine to Sit     Supine to sit: Min guard;HOB elevated     General bed mobility comments: no physical assist given. pt used bedrail. increased time. Once EOB, pt co nausea and feeling hot. She denied dizziness/lightheadedness.  Transfers Overall transfer level: Needs assistance Equipment used: Rolling walker (2 wheeled) Transfers: Sit to/from [facility] Sit to Stand: Min assist Stand pivot transfers: Min assist       General transfer comment: Assist to rise, stabilize, control descent. VCs safety, technique, hand/LE placement. Increased time. Stand pivot, bed to recliner, with RW. Again, pt c/o nausea and feeling hot.  Ambulation/Gait             General Gait Details: NT-pt unable to attempt 2* nausea   Stairs             Wheelchair Mobility    Modified Rankin (Stroke Patients Only)       Balance Overall balance assessment: Needs assistance         Standing balance support: Bilateral upper extremity  supported Standing balance-Leahy Scale: Poor                              Cognition Arousal/Alertness: Awake/alert Behavior During Therapy: WFL for tasks assessed/performed Overall Cognitive Status: Within Functional Limits for tasks assessed                                        Exercises Total Joint Exercises Ankle Circles/Pumps: AROM;Both;10 reps Quad Sets: AROM;Both;10 reps Heel Slides: AAROM;Left;10 reps Hip ABduction/ADduction: AAROM;Left;10 reps Straight Leg Raises: AROM;AAROM;Left;10 reps Goniometric ROM: ~10-60 degrees    General Comments        Pertinent Vitals/Pain Pain Assessment: 0-10 Pain Score: 7  Pain Location: L knee Pain Descriptors / Indicators: Aching;Sore;Discomfort Pain Intervention(s): Monitored during session;Ice applied;Repositioned    Home Living                      Prior Function            PT Goals (current goals can now be found in the care plan section) Progress towards PT goals: Progressing toward goals    Frequency    7X/week      PT Plan Current plan remains appropriate    Co-evaluation              AM-PAC PT "6 Clicks" Mobility   Outcome Measure  Help needed turning from your back to your side while in a flat bed without using bedrails?: A Little Help needed moving from lying on your back to sitting on the side of a flat bed without using bedrails?: A Little Help needed moving to and from a bed to a chair (including a wheelchair)?: A Little Help needed standing up from a chair using your arms (e.g., wheelchair or bedside chair)?: A Little Help needed to walk in hospital room?: A Little Help needed climbing 3-5 steps with a railing? : A Lot 6 Click Score: 17    End of Session Equipment Utilized During Treatment: Gait belt Activity Tolerance: (limited by nausea) Patient left: in chair;with call bell/phone within reach;with chair alarm set   PT Visit Diagnosis: Other  abnormalities of gait and mobility (R26.89);Pain Pain - Right/Left: Left Pain - part of body: Knee     Time: 1140-1209 PT Time Calculation (min) (ACUTE ONLY): 29 min  Charges:  $Gait Training: 8-22 mins $Therapeutic Exercise: 8-22 mins                        Doreatha Massed, PT Acute Rehabilitation

## 2019-07-17 NOTE — TOC Progression Note (Signed)
Transition of Care [facility]) - Progression Note    Patient Details  Name: Dominique Mooney MRN: [id number] Date of Birth: 1950/10/17  [facility]) CM/SW Contact  Joaquin Courts, RN Phone Number: 07/17/2019, 11:54 AM  Clinical Narrative:    [facility] for [place]. Patient reports has RW and 3in1 at home.     Expected Discharge Plan: [place] Barriers to Discharge: No Barriers Identified  Expected Discharge Plan and Services Expected Discharge Plan: [place]   Discharge Planning Services: CM Consult Post Acute Care Choice: [place] arrangements for the past 2 months: Single Family Home Expected Discharge Date: 07/17/19               DME Arranged: N/A DME Agency: NA       HH Arranged: PT [place] Agency: [facility] [employer] Date [place]: 07/17/19 Time [place]: [place] Representative spoke with at [place]: [place] ([place]) Interventions    Readmission Risk Interventions No flowsheet data found.

## 2019-07-18 DIAGNOSIS — Z823 Family history of stroke: Secondary | ICD-10-CM | POA: Diagnosis not present

## 2019-07-18 DIAGNOSIS — E785 Hyperlipidemia, unspecified: Secondary | ICD-10-CM | POA: Diagnosis not present

## 2019-07-18 DIAGNOSIS — Z8249 Family history of ischemic heart disease and other diseases of the circulatory system: Secondary | ICD-10-CM | POA: Diagnosis not present

## 2019-07-18 DIAGNOSIS — Z7982 Long term (current) use of aspirin: Secondary | ICD-10-CM | POA: Diagnosis not present

## 2019-07-18 DIAGNOSIS — Z20822 Contact with and (suspected) exposure to covid-19: Secondary | ICD-10-CM | POA: Diagnosis not present

## 2019-07-18 DIAGNOSIS — E039 Hypothyroidism, unspecified: Secondary | ICD-10-CM | POA: Diagnosis not present

## 2019-07-18 DIAGNOSIS — Z79890 Hormone replacement therapy (postmenopausal): Secondary | ICD-10-CM | POA: Diagnosis not present

## 2019-07-18 DIAGNOSIS — K589 Irritable bowel syndrome without diarrhea: Secondary | ICD-10-CM | POA: Diagnosis not present

## 2019-07-18 DIAGNOSIS — Z8371 Family history of colonic polyps: Secondary | ICD-10-CM | POA: Diagnosis not present

## 2019-07-18 DIAGNOSIS — M1712 Unilateral primary osteoarthritis, left knee: Secondary | ICD-10-CM | POA: Diagnosis not present

## 2019-07-18 DIAGNOSIS — I10 Essential (primary) hypertension: Secondary | ICD-10-CM | POA: Diagnosis not present

## 2019-07-18 DIAGNOSIS — Z90710 Acquired absence of both cervix and uterus: Secondary | ICD-10-CM | POA: Diagnosis not present

## 2019-07-18 DIAGNOSIS — Z7951 Long term (current) use of inhaled steroids: Secondary | ICD-10-CM | POA: Diagnosis not present

## 2019-07-18 DIAGNOSIS — K219 Gastro-esophageal reflux disease without esophagitis: Secondary | ICD-10-CM | POA: Diagnosis not present

## 2019-07-18 DIAGNOSIS — J4530 Mild persistent asthma, uncomplicated: Secondary | ICD-10-CM | POA: Diagnosis not present

## 2019-07-18 DIAGNOSIS — Z79899 Other long term (current) drug therapy: Secondary | ICD-10-CM | POA: Diagnosis not present

## 2019-07-18 MED ORDER — HYDROCODONE-ACETAMINOPHEN 7.5-325 MG PO TABS
1.0000 | ORAL_TABLET | ORAL | Status: DC | PRN
  Administered 2019-07-18 – 2019-07-19 (×2): 2 via ORAL
  Filled 2019-07-18 (×2): qty 2

## 2019-07-18 NOTE — Progress Notes (Signed)
     Subjective: 2 Days Post-Op Procedure(s) (LRB): TOTAL KNEE ARTHROPLASTY (Left)   Patient reports pain as mild, which is currently controlled with medication.  She does state however that the medication is making her with nausea and vomiting as well as knocking her out.  Both of these are causing delays in her progress with physical therapy.  We have discussed various options and she is good with decreasing the pain medicine to see if this has less effect on her system.  I will change her from oxycodone to hydrocodone.   Objective:   VITALS:   Vitals:   07/17/19 2120 07/18/19 0531  BP: 129/76 132/72  Pulse: 67 86  Resp: 16 16  Temp: 97.6 F (36.4 C) 98.8 F (37.1 C)  SpO2: 96% 93%    Dorsiflexion/Plantar flexion intact Incision: scant drainage No cellulitis present Compartment soft  LABS Recent Labs    07/16/19 1048  HGB 11.8*  HCT 37.0  WBC 10.3  PLT 281    Recent Labs    07/16/19 1048  CREATININE 0.92     Assessment/Plan: 2 Days Post-Op Procedure(s) (LRB): TOTAL KNEE ARTHROPLASTY (Left)   Up with therapy Discharge home when safe and able Changing Oxy to Norco WBAT LLE ACE bandages removed Continue to anticipate left foot pain is due to the foot holder at time of surgery. Probable d/c home tomorrow as she needs to be able to function better and hopefully a change in medication will help     Danae Orleans PA-C  [facility]  [place] [address], [address], [place], [place] [address] Phone: [phone] [email or web address] Facebook  [employer]

## 2019-07-18 NOTE — Plan of Care (Signed)
  Problem: Education: Goal: Knowledge of General Education information will improve Description: Including pain rating scale, medication(s)/side effects and non-pharmacologic comfort measures 07/18/2019 2055 by Ladoris Gene, RN Outcome: Progressing 07/18/2019 2055 by Ladoris Gene, RN Outcome: Progressing   Problem: Health Behavior/Discharge Planning: Goal: Ability to manage health-related needs will improve 07/18/2019 2055 by Ladoris Gene, RN Outcome: Progressing 07/18/2019 2055 by Ladoris Gene, RN Outcome: Progressing   Problem: Clinical Measurements: Goal: Ability to maintain clinical measurements within normal limits will improve 07/18/2019 2055 by Ladoris Gene, RN Outcome: Progressing 07/18/2019 2055 by Ladoris Gene, RN Outcome: Progressing Goal: Will remain free from infection 07/18/2019 2055 by Ladoris Gene, RN Outcome: Progressing 07/18/2019 2055 by Ladoris Gene, RN Outcome: Progressing Goal: Diagnostic test results will improve 07/18/2019 2055 by Ladoris Gene, RN Outcome: Progressing 07/18/2019 2055 by Ladoris Gene, RN Outcome: Progressing Goal: Respiratory complications will improve 07/18/2019 2055 by Ladoris Gene, RN Outcome: Progressing 07/18/2019 2055 by Ladoris Gene, RN Outcome: Progressing Goal: Cardiovascular complication will be avoided 07/18/2019 2055 by Ladoris Gene, RN Outcome: Progressing 07/18/2019 2055 by Ladoris Gene, RN Outcome: Progressing   Problem: Activity: Goal: Risk for activity intolerance will decrease 07/18/2019 2055 by Ladoris Gene, RN Outcome: Progressing 07/18/2019 2055 by Ladoris Gene, RN Outcome: Progressing   Problem: Nutrition: Goal: Adequate nutrition will be maintained 07/18/2019 2055 by Ladoris Gene, RN Outcome: Progressing 07/18/2019 2055 by Ladoris Gene, RN Outcome: Progressing   Problem: Coping: Goal: Level of anxiety will decrease 07/18/2019 2055 by Ladoris Gene, RN Outcome: Progressing 07/18/2019 2055 by Ladoris Gene, RN Outcome: Progressing   Problem: Elimination: Goal: Will not experience complications related to bowel motility 07/18/2019 2055 by Ladoris Gene, RN Outcome: Progressing 07/18/2019 2055 by Ladoris Gene, RN Outcome: Progressing Goal: Will not experience complications related to urinary retention 07/18/2019 2055 by Ladoris Gene, RN Outcome: Progressing 07/18/2019 2055 by Ladoris Gene, RN Outcome: Progressing   Problem: Pain Managment: Goal: General experience of comfort will improve 07/18/2019 2055 by Ladoris Gene, RN Outcome: Progressing 07/18/2019 2055 by Ladoris Gene, RN Outcome: Progressing   Problem: Safety: Goal: Ability to remain free from injury will improve 07/18/2019 2055 by Ladoris Gene, RN Outcome: Progressing 07/18/2019 2055 by Ladoris Gene, RN Outcome: Progressing   Problem: Skin Integrity: Goal: Risk for impaired skin integrity will decrease 07/18/2019 2055 by Ladoris Gene, RN Outcome: Progressing 07/18/2019 2055 by Ladoris Gene, RN Outcome: Progressing

## 2019-07-18 NOTE — Progress Notes (Signed)
Physical Therapy Treatment Patient Details Name: Dominique Mooney MRN: [id number] DOB: 08-07-1950 Today's Date: 07/18/2019    History of Present Illness 69 y.o. female s/p Lt TKA on 07/16/19 with PMH significant for IBS, hypothyroidism, HTN, HLD, GERD, Asthma, OA, pancreatitis, Lt heart cath in 2019.    PT Comments    Progressing slowly with mobility. Pt c/o fatigue on today. Reports little to no nausea on today.    Follow Up Recommendations  Follow surgeon's recommendation for DC plan and follow-up therapies     Equipment Recommendations  Rolling walker with 5" wheels;3in1 (PT)(youth height)    Recommendations for Other Services       Precautions / Restrictions Precautions Precautions: Fall Precaution Comments: nausea Restrictions Weight Bearing Restrictions: No Other Position/Activity Restrictions: WBAT    Mobility  Bed Mobility Overal bed mobility: Needs Assistance Bed Mobility: Supine to Sit     Supine to sit: Min assist;HOB elevated     General bed mobility comments: Assist for L LE.  Transfers Overall transfer level: Needs assistance Equipment used: Rolling walker (2 wheeled) Transfers: Sit to/from Stand Sit to Stand: Min assist         General transfer comment: Assist to rise, stabilize, control descent. VCs safety, technique, hand/LE placement. Increased time.  Ambulation/Gait Ambulation/Gait assistance: Min assist Gait Distance (Feet): 25 Feet(x2) Assistive device: Rolling walker (2 wheeled) Gait Pattern/deviations: Step-to pattern;Step-through pattern;Decreased stride length     General Gait Details: VCs safety, technique, sequence, proper use of RW. Slow gait speed. Pt fatiguees easily. Seated rest break after 25 feet needed/taken. Transported back to room in [facility]    Modified Rankin (Stroke Patients Only)       Balance Overall balance assessment: Needs assistance         Standing  balance support: Bilateral upper extremity supported Standing balance-Leahy Scale: Poor                              Cognition Arousal/Alertness: Awake/alert Behavior During Therapy: WFL for tasks assessed/performed Overall Cognitive Status: Within Functional Limits for tasks assessed                                        Exercises Total Joint Exercises Ankle Circles/Pumps: AROM;Both;10 reps Quad Sets: AROM;Both;10 reps Heel Slides: AAROM;Left;10 reps Hip ABduction/ADduction: AAROM;Left;10 reps Straight Leg Raises: AAROM;Left;10 reps Goniometric ROM: ~10-50 degrees    General Comments        Pertinent Vitals/Pain Pain Assessment: 0-10 Pain Score: 6  Pain Location: L knee Pain Descriptors / Indicators: Aching;Sore;Discomfort Pain Intervention(s): Monitored during session;Repositioned    Home Living                      Prior Function            PT Goals (current goals can now be found in the care plan section) Progress towards PT goals: Progressing toward goals    Frequency    7X/week      PT Plan Current plan remains appropriate    Co-evaluation              AM-PAC PT "6 Clicks" Mobility   Outcome Measure  Help needed turning from your back to your side while in  a flat bed without using bedrails?: A Little Help needed moving from lying on your back to sitting on the side of a flat bed without using bedrails?: A Little Help needed moving to and from a bed to a chair (including a wheelchair)?: A Little Help needed standing up from a chair using your arms (e.g., wheelchair or bedside chair)?: A Little Help needed to walk in hospital room?: A Little Help needed climbing 3-5 steps with a railing? : A Lot 6 Click Score: 17    End of Session Equipment Utilized During Treatment: Gait belt Activity Tolerance: Patient tolerated treatment well Patient left: in chair;with call bell/phone within reach   PT Visit  Diagnosis: Other abnormalities of gait and mobility (R26.89);Pain Pain - Right/Left: Left Pain - part of body: Knee     Time: 1139-1201 PT Time Calculation (min) (ACUTE ONLY): 22 min  Charges:  $Gait Training: 8-22 mins                        Doreatha Massed, PT Acute Rehabilitation

## 2019-07-18 NOTE — TOC Progression Note (Signed)
Transition of Care [facility]) - Progression Note    Patient Details  Name: Dominique Mooney MRN: [id number] Date of Birth: Oct 20, 1950  Transition of Care [facility]) CM/SW Contact  Joaquin Courts, RN Phone Number: 07/18/2019, 4:48 PM  Clinical Narrative:    CM notified patient needs RW not rollator. CM delivered RW to bedside.     Expected Discharge Plan: [place] Barriers to Discharge: No Barriers Identified  Expected Discharge Plan and Services Expected Discharge Plan: [place]   Discharge Planning Services: CM Consult Post Acute Care Choice: [place] arrangements for the past 2 months: Single Family Home Expected Discharge Date: 07/18/19               DME Arranged: [place] Rile rolling, 3-N-1 DME Agency: [employer] Date DME Agency Contacted: 07/17/19 Time DME Agency Contacted: [phone] Representative spoke with at DME Agency: Cm delivered equipment and left referral info for rep HH Arranged: PT [place]: [facility] [employer] Date [place]: 07/17/19 Time [place]: [place] Representative spoke with at [place]: [place] ([place]) Interventions    Readmission Risk Interventions No flowsheet data found.

## 2019-07-18 NOTE — Progress Notes (Signed)
Physical Therapy Treatment Patient Details Name: Dominique Mooney MRN: [id number] DOB: December 03, 1950 Today's Date: 07/18/2019    History of Present Illness 69 y.o. female s/p Lt TKA on 07/16/19 with PMH significant for IBS, hypothyroidism, HTN, HLD, GERD, Asthma, OA, pancreatitis, Lt heart cath in 2019.    PT Comments    Progressing slowly with mobility. Pt appears drowsy and continues to get weak and fatigued with short distance ambulation. BP 106/49 after walking this afternoon. She c/o moderate-severe pain this afternoon with activity. Do not feel pt is safe to d/c home on today-she has not met her PT goals. Will continue to follow and progress activity as tolerated.     Follow Up Recommendations  Follow surgeon's recommendation for DC plan and follow-up therapies     Equipment Recommendations  Rolling walker with 5" wheels;3in1 (PT)(pt needs the appropriate standard RW, NOT a rollator)    Recommendations for Other Services       Precautions / Restrictions Precautions Precautions: Fall Precaution Comments: nausea Restrictions Weight Bearing Restrictions: No Other Position/Activity Restrictions: WBAT    Mobility  Bed Mobility Overal bed mobility: Needs Assistance Bed Mobility: Sit to Supine     Supine to sit: Min assist;HOB elevated Sit to supine: Min assist;HOB elevated   General bed mobility comments: Assist for L LE. Increased time.  Transfers Overall transfer level: Needs assistance Equipment used: Rolling walker (2 wheeled) Transfers: Sit to/from Stand Sit to Stand: Min assist         General transfer comment: Assist to rise, stabilize, control descent. VCs safety, technique, hand/LE placement. Increased time.  Ambulation/Gait Ambulation/Gait assistance: Min assist Gait Distance (Feet): 35 Feet(35'x1, 25'x1) Assistive device: Rolling walker (2 wheeled) Gait Pattern/deviations: Step-to pattern;Step-through pattern;Decreased stride length     General Gait  Details: VCs safety, technique, sequence, proper use of RW. Slow gait speed. Pt fatigues easily. Seated rest break after 25 feet needed/taken. Pt was able to walk back to room after seated rest break. She continues to get fatigued/weak with short distance ambulation-BP 106/49   Stairs             Wheelchair Mobility    Modified Rankin (Stroke Patients Only)       Balance Overall balance assessment: Needs assistance         Standing balance support: Bilateral upper extremity supported Standing balance-Leahy Scale: Poor                              Cognition Arousal/Alertness: Awake/alert Behavior During Therapy: WFL for tasks assessed/performed Overall Cognitive Status: Within Functional Limits for tasks assessed                                        Exercises     General Comments        Pertinent Vitals/Pain Pain Assessment: 0-10 Pain Score: 8  Pain Location: L knee Pain Descriptors / Indicators: Aching;Sore;Discomfort;Sharp Pain Intervention(s): Limited activity within patient's tolerance;Monitored during session;Ice applied;Repositioned;Patient requesting pain meds-RN notified    Home Living                      Prior Function            PT Goals (current goals can now be found in the care plan section) Progress towards PT goals: Progressing toward goals  Frequency    7X/week      PT Plan Current plan remains appropriate    Co-evaluation              AM-PAC PT "6 Clicks" Mobility   Outcome Measure  Help needed turning from your back to your side while in a flat bed without using bedrails?: A Little Help needed moving from lying on your back to sitting on the side of a flat bed without using bedrails?: A Little Help needed moving to and from a bed to a chair (including a wheelchair)?: A Little Help needed standing up from a chair using your arms (e.g., wheelchair or bedside chair)?: A  Little Help needed to walk in hospital room?: A Little Help needed climbing 3-5 steps with a railing? : A Lot 6 Click Score: 17    End of Session Equipment Utilized During Treatment: Gait belt Activity Tolerance: Patient limited by fatigue Patient left: in bed;with call bell/phone within reach;with bed alarm set   PT Visit Diagnosis: Other abnormalities of gait and mobility (R26.89);Pain Pain - Right/Left: Left Pain - part of body: Knee     Time: 4235-3614 PT Time Calculation (min) (ACUTE ONLY): 30 min  Charges:  $Gait Training: 23-37 mins                         Doreatha Massed, PT Acute Rehabilitation

## 2019-07-19 ENCOUNTER — Encounter: Payer: Self-pay | Admitting: *Deleted

## 2019-07-19 MED ORDER — HYDROCODONE-ACETAMINOPHEN 7.5-325 MG PO TABS: 1.0000 | ORAL_TABLET | ORAL | 0 refills | Status: DC | PRN

## 2019-07-19 NOTE — Discharge Summary (Signed)
Physician Discharge Summary  Patient ID: Dominique Mooney MRN: [id number] DOB/AGE: [birth year] 69 y.o.  Admit date: 07/16/2019 Discharge date: 07/19/2019  Admission Diagnoses: Left knee Osteoarthritis Discharge Diagnoses:  Active Problems:   Osteoarthritis of left knee   Discharged Condition: stable  Hospital Course: patient was admitted on February 26 for a left total knee arthroplasty due to left knee end-stage osteoarthritis. Patient did well during the operation, was sent to PACU in stable condition and then sent up to the postop floor in stable.  She was evaluated by physical therapy that night. Patient was unable to get up due to nausea.  No events overnight. Postop day 1: Hemovac drain was removed.  She was complaining of pain in the left foot, most likely due to foot holder during surgery. Patient was unable to ambulate with therapy on postop day 1 due to nausea.  She was able to transfer from bed to recliner.  No events overnight. Postop day 2:patient is still complaining of some mild nausea mostly noted with pain medication.  Patient was placed on a new pain medication to see if this helps. Patient worked with physical therapy in the afternoon was able to get up and ambulate was fatigued after short distance ambulation.  Complaining of moderate to after ambulation.  No events overnight. Postop day 3: Patient feeling better in the morning  Denies any nausea or vomiting.  Worked with physical therapy in the morning was cleared to go home today.  Patient was discharged home in stable condition. Was sent home wand nausea medication as well as muscle relaxer.   Consults: Physical Therapy  Significant Diagnostic Studies: None  Treatments: IV hydration, antibiotics: Ancef, analgesia: acetaminophen w/ codeine, anticoagulation: Lovenox, therapies: PT and surgery: Left TKA  Discharge Exam: Blood pressure 140/81, pulse (!) 107, temperature 99.4 F (37.4 C), temperature source Oral, resp. rate  18, height 5\' 1"  (1.549 m), weight 85.7 kg, SpO2 93 %. General appearance: alert, cooperative, appears stated age and no distress Extremities: extremities normal, atraumatic, no cyanosis or edema, Homans sign is negative, no sign of DVT and no edema, redness or tenderness in the calves or thighs Pulses: 2+ and symmetric Skin: Skin color, texture, turgor normal. No rashes or lesions Neurologic: Alert and oriented X 3, normal strength and tone. Normal symmetric reflexes. Normal coordination and gait Incision/Wound: Dressings clean dry and intact  Disposition: Discharge disposition: 01-Home or Self Care       Discharge Instructions    Call MD / Call 911   Complete by: As directed    If you experience chest pain or shortness of breath, CALL 911 and be transported to the hospital emergency room.  If you develope a fever above 101 F, pus (Goar drainage) or increased drainage or redness at the wound, or calf pain, call your surgeon's office.   Call MD / Call 911   Complete by: As directed    If you experience chest pain or shortness of breath, CALL 911 and be transported to the hospital emergency room.  If you develope a fever above 101 F, pus (Buccellato drainage) or increased drainage or redness at the wound, or calf pain, call your surgeon's office.   Constipation Prevention   Complete by: As directed    Drink plenty of fluids.  Prune juice may be helpful.  You may use a stool softener, such as Colace (over the counter) 100 mg twice a day.  Use MiraLax (over the counter) for constipation as needed.  Constipation Prevention   Complete by: As directed    Drink plenty of fluids.  Prune juice may be helpful.  You may use a stool softener, such as Colace (over the counter) 100 mg twice a day.  Use MiraLax (over the counter) for constipation as needed.   DME Bedside commode   Complete by: As directed    Patient needs a bedside commode to treat with the following condition: Surgery, elective   Diet  - low sodium heart healthy   Complete by: As directed    Discharge instructions   Complete by: As directed    Dr. Sydnee Cabal [facility] [address] [place],  [address] [phone]  TOTAL KNEE REPLACEMENT POSTOPERATIVE DIRECTIONS  Knee Rehabilitation, Guidelines Following Surgery  Results after knee surgery are often greatly improved when you follow the exercise, range of motion and muscle strengthening exercises prescribed by your doctor. Safety measures are also important to protect the knee from further injury. Any time any of these exercises cause you to have increased pain or swelling in your knee joint, decrease the amount until you are comfortable again and slowly increase them. If you have problems or questions, call your caregiver or physical therapist for advice.   HOME CARE INSTRUCTIONS  Remove items at home which could result in a fall. This includes throw rugs or furniture in walking pathways.  ICE to the affected knee every three hours for 30 minutes at a time and then as needed for pain and swelling.  Continue to use ice on the knee for pain and swelling from surgery. You may notice swelling that will progress down to the foot and ankle.  This is normal after surgery.  Elevate the leg when you are not up walking on it.   Continue to use the breathing machine which will help keep your temperature down.  It is common for your temperature to cycle up and down following surgery, especially at night when you are not up moving around and exerting yourself.  The breathing machine keeps your lungs expanded and your temperature down. Do not place pillow under knee, focus on keeping the knee straight while resting   DIET You may resume your previous home diet once your are discharged from the hospital.  DRESSING / WOUND CARE / SHOWERING Keep the surgical dressing until follow up.  The dressing is water proof, but you need to put extra covering over it like  plastic wrap.  IF THE DRESSING FALLS OFF or the wound gets wet inside, change the dressing with sterile gauze.  Please use good hand washing techniques before changing the dressing.  Do not use any lotions or creams on the incision until instructed by your surgeon.   You may start showering once you are discharged home but do not submerge the incision under water.  You are sent home with an ACE bandage on over the leg, this can be removed at 3 days from surgery. At this time you can start showering. Please place the Deroy TED stocking on the surgical leg after. This needs to be worn on the surgical leg for 2 weeks after surgery.   ACTIVITY Walk with your walker as instructed. Use walker as long as suggested by your caregivers. Avoid periods of inactivity such as sitting longer than an hour when not asleep. This helps prevent blood clots.  You may resume a sexual relationship in one month or when given the OK by your doctor.  You may  return to work once you are cleared by your doctor.  Do not drive a car for 6 weeks or until released by you surgeon.  Do not drive while taking narcotics.  WEIGHT BEARING Weight bearing as tolerated with assist device (walker, cane, etc) as directed, use it as long as suggested by your surgeon or therapist, typically at least 4-6 weeks.  POSTOPERATIVE CONSTIPATION PROTOCOL Constipation - defined medically as fewer than three stools per week and severe constipation as less than one stool per week.  One of the most common issues patients have following surgery is constipation.  Even if you have a regular bowel pattern at home, your normal regimen is likely to be disrupted due to multiple reasons following surgery.  Combination of anesthesia, postoperative narcotics, change in appetite and fluid intake all can affect your bowels.  In order to avoid complications following surgery, here are some recommendations in order to help you during your recovery period.  Colace  (docusate) - Pick up an over-the-counter form of Colace or another stool softener and take twice a day as long as you are requiring postoperative pain medications.  Take with a full glass of water daily.  If you experience loose stools or diarrhea, hold the colace until you stool forms back up.  If your symptoms do not get better within 1 week or if they get worse, check with your doctor.  Dulcolax (bisacodyl) - Pick up over-the-counter and take as directed by the product packaging as needed to assist with the movement of your bowels.  Take with a full glass of water.  Use this product as needed if not relieved by Colace only.   MiraLax (polyethylene glycol) - Pick up over-the-counter to have on hand.  MiraLax is a solution that will increase the amount of water in your bowels to assist with bowel movements.  Take as directed and can mix with a glass of water, juice, soda, coffee, or tea.  Take if you go more than two days without a movement. Do not use MiraLax more than once per day. Call your doctor if you are still constipated or irregular after using this medication for 7 days in a row.  If you continue to have problems with postoperative constipation, please contact the office for further assistance and recommendations.  If you experience "the worst abdominal pain ever" or develop nausea or vomiting, please contact the office immediatly for further recommendations for treatment.  ITCHING  If you experience itching with your medications, try taking only a single pain pill, or even half a pain pill at a time.  You can also use Benadryl over the counter for itching or also to help with sleep.   TED HOSE STOCKINGS Wear the elastic stockings on both legs for two weeks following surgery during the day but you may remove then at night for sleeping.  Okay to remove ACE in 3 days, put TED on after  MEDICATIONS See your medication summary on the "After Visit Summary" that the nursing staff will review  with you prior to discharge.  You may have some home medications which will be placed on hold until you complete the course of blood thinner medication.  It is important for you to complete the blood thinner medication as prescribed by your surgeon.  Continue your approved medications as instructed at time of discharge. If you were not previously taking any blood thinners prior to surgery please start taking Aspirin 325 mg tabs twice daily for 6 weeks.  If you are unable to take Aspirin please let your doctor know.   PRECAUTIONS If you experience chest pain or shortness of breath - call 911 immediately for transfer to the hospital emergency department.  If you develop a fever greater that 101 F, purulent drainage from wound, increased redness or drainage from wound, foul odor from the wound/dressing, or calf pain - CONTACT YOUR SURGEON.                                                   FOLLOW-UP APPOINTMENTS Make sure you keep all of your appointments after your operation with your surgeon and caregivers. You should call the office at the above phone number and make an appointment for approximately two weeks after the date of your surgery or on the date instructed by your surgeon outlined in the "After Visit Summary".   RANGE OF MOTION AND STRENGTHENING EXERCISES  Rehabilitation of the knee is important following a knee injury or an operation. After just a few days of immobilization, the muscles of the thigh which control the knee become weakened and shrink (atrophy). Knee exercises are designed to build up the tone and strength of the thigh muscles and to improve knee motion. Often times heat used for twenty to thirty minutes before working out will loosen up your tissues and help with improving the range of motion but do not use heat for the first two weeks following surgery. These exercises can be done on a training (exercise) mat, on the floor, on a table or on a bed. Use what ever works the best and  is most comfortable for you Knee exercises include:  Leg Lifts - While your knee is still immobilized in a splint or cast, you can do straight leg raises. Lift the leg to 60 degrees, hold for 3 sec, and slowly lower the leg. Repeat 10-20 times 2-3 times daily. Perform this exercise against resistance later as your knee gets better.  Quad and Hamstring Sets - Tighten up the muscle on the front of the thigh (Quad) and hold for 5-10 sec. Repeat this 10-20 times hourly. Hamstring sets are done by pushing the foot backward against an object and holding for 5-10 sec. Repeat as with quad sets.  Leg Slides: Lying on your back, slowly slide your foot toward your buttocks, bending your knee up off the floor (only go as far as is comfortable). Then slowly slide your foot back down until your leg is flat on the floor again. Angel Wings: Lying on your back spread your legs to the side as far apart as you can without causing discomfort.  A rehabilitation program following serious knee injuries can speed recovery and prevent re-injury in the future due to weakened muscles. Contact your doctor or a physical therapist for more information on knee rehabilitation.   IF YOU ARE TRANSFERRED TO A SKILLED REHAB FACILITY If the patient is transferred to a skilled rehab facility following release from the hospital, a list of the current medications will be sent to the facility for the patient to continue.  When discharged from the skilled rehab facility, please have the facility set up the patient's [place] prior to being released. Also, the skilled facility will be responsible for providing the patient with their medications at time of release from the facility to include  their pain medication, the muscle relaxants, and their blood thinner medication. If the patient is still at the rehab facility at time of the two week follow up appointment, the skilled rehab facility will also need to assist the patient in  arranging follow up appointment in our office and any transportation needs.  MAKE SURE YOU:  Understand these instructions.  Get help right away if you are not doing well or get worse.    Pick up stool softner and laxative for home use following surgery while on pain medications. May shower starting three days after surgery. Please use a clean towel to pat the leg dry following showers. Continue to use ice for pain and swelling after surgery. Do not use any lotions or creams on the incision until instructed by you Start Aspirin immediately following surgery.   Discharge instructions   Complete by: As directed    Dr. Sydnee Cabal [facility] [address], [place],  [address] [phone]  TOTAL KNEE REPLACEMENT POSTOPERATIVE DIRECTIONS  Knee Rehabilitation, Guidelines Following Surgery  Results after knee surgery are often greatly improved when you follow the exercise, range of motion and muscle strengthening exercises prescribed by your doctor. Safety measures are also important to protect the knee from further injury. Any time any of these exercises cause you to have increased pain or swelling in your knee joint, decrease the amount until you are comfortable again and slowly increase them. If you have problems or questions, call your caregiver or physical therapist for advice.   HOME CARE INSTRUCTIONS  Remove items at home which could result in a fall. This includes throw rugs or furniture in walking pathways.  ICE to the affected knee every three hours for 30 minutes at a time and then as needed for pain and swelling.  Continue to use ice on the knee for pain and swelling from surgery. You may notice swelling that will progress down to the foot and ankle.  This is normal after surgery.  Elevate the leg when you are not up walking on it.   Continue to use the breathing machine which will help keep your temperature down.  It is common for your temperature to cycle  up and down following surgery, especially at night when you are not up moving around and exerting yourself.  The breathing machine keeps your lungs expanded and your temperature down. Do not place pillow under knee, focus on keeping the knee straight while resting   DIET You may resume your previous home diet once your are discharged from the hospital.  DRESSING / WOUND CARE / SHOWERING Keep the surgical dressing until follow up.  The dressing is water proof, but you need to put extra covering over it like plastic wrap.  IF THE DRESSING FALLS OFF or the wound gets wet inside, change the dressing with sterile gauze.  Please use good hand washing techniques before changing the dressing.  Do not use any lotions or creams on the incision until instructed by your surgeon.   You may start showering once you are discharged home but do not submerge the incision under water.  You are sent home with an ACE bandage on over the leg, this can be removed at 3 days from surgery. At this time you can start showering. Please place the Cifuentes TED stocking on the surgical leg after. This needs to be worn on the surgical leg for 2 weeks after surgery.   ACTIVITY Walk with your walker as instructed.  Use walker as long as suggested by your caregivers. Avoid periods of inactivity such as sitting longer than an hour when not asleep. This helps prevent blood clots.  You may resume a sexual relationship in one month or when given the OK by your doctor.  You may return to work once you are cleared by your doctor.  Do not drive a car for 6 weeks or until released by you surgeon.  Do not drive while taking narcotics.  WEIGHT BEARING Weight bearing as tolerated with assist device (walker, cane, etc) as directed, use it as long as suggested by your surgeon or therapist, typically at least 4-6 weeks.  POSTOPERATIVE CONSTIPATION PROTOCOL Constipation - defined medically as fewer than three stools per week and severe  constipation as less than one stool per week.  One of the most common issues patients have following surgery is constipation.  Even if you have a regular bowel pattern at home, your normal regimen is likely to be disrupted due to multiple reasons following surgery.  Combination of anesthesia, postoperative narcotics, change in appetite and fluid intake all can affect your bowels.  In order to avoid complications following surgery, here are some recommendations in order to help you during your recovery period.  Colace (docusate) - Pick up an over-the-counter form of Colace or another stool softener and take twice a day as long as you are requiring postoperative pain medications.  Take with a full glass of water daily.  If you experience loose stools or diarrhea, hold the colace until you stool forms back up.  If your symptoms do not get better within 1 week or if they get worse, check with your doctor.  Dulcolax (bisacodyl) - Pick up over-the-counter and take as directed by the product packaging as needed to assist with the movement of your bowels.  Take with a full glass of water.  Use this product as needed if not relieved by Colace only.   MiraLax (polyethylene glycol) - Pick up over-the-counter to have on hand.  MiraLax is a solution that will increase the amount of water in your bowels to assist with bowel movements.  Take as directed and can mix with a glass of water, juice, soda, coffee, or tea.  Take if you go more than two days without a movement. Do not use MiraLax more than once per day. Call your doctor if you are still constipated or irregular after using this medication for 7 days in a row.  If you continue to have problems with postoperative constipation, please contact the office for further assistance and recommendations.  If you experience "the worst abdominal pain ever" or develop nausea or vomiting, please contact the office immediatly for further recommendations for  treatment.  ITCHING  If you experience itching with your medications, try taking only a single pain pill, or even half a pain pill at a time.  You can also use Benadryl over the counter for itching or also to help with sleep.   TED HOSE STOCKINGS Wear the elastic stockings on both legs for two weeks following surgery during the day but you may remove then at night for sleeping.  Okay to remove ACE in 3 days, put TED on after  MEDICATIONS See your medication summary on the "After Visit Summary" that the nursing staff will review with you prior to discharge.  You may have some home medications which will be placed on hold until you complete the course of blood thinner medication.  It is  important for you to complete the blood thinner medication as prescribed by your surgeon.  Continue your approved medications as instructed at time of discharge. If you were not previously taking any blood thinners prior to surgery please start taking Aspirin 325 mg tabs twice daily for 6 weeks. If you are unable to take Aspirin please let your doctor know.   PRECAUTIONS If you experience chest pain or shortness of breath - call 911 immediately for transfer to the hospital emergency department.  If you develop a fever greater that 101 F, purulent drainage from wound, increased redness or drainage from wound, foul odor from the wound/dressing, or calf pain - CONTACT YOUR SURGEON.                                                   FOLLOW-UP APPOINTMENTS Make sure you keep all of your appointments after your operation with your surgeon and caregivers. You should call the office at the above phone number and make an appointment for approximately two weeks after the date of your surgery or on the date instructed by your surgeon outlined in the "After Visit Summary".   RANGE OF MOTION AND STRENGTHENING EXERCISES  Rehabilitation of the knee is important following a knee injury or an operation. After just a few days of  immobilization, the muscles of the thigh which control the knee become weakened and shrink (atrophy). Knee exercises are designed to build up the tone and strength of the thigh muscles and to improve knee motion. Often times heat used for twenty to thirty minutes before working out will loosen up your tissues and help with improving the range of motion but do not use heat for the first two weeks following surgery. These exercises can be done on a training (exercise) mat, on the floor, on a table or on a bed. Use what ever works the best and is most comfortable for you Knee exercises include:  Leg Lifts - While your knee is still immobilized in a splint or cast, you can do straight leg raises. Lift the leg to 60 degrees, hold for 3 sec, and slowly lower the leg. Repeat 10-20 times 2-3 times daily. Perform this exercise against resistance later as your knee gets better.  Quad and Hamstring Sets - Tighten up the muscle on the front of the thigh (Quad) and hold for 5-10 sec. Repeat this 10-20 times hourly. Hamstring sets are done by pushing the foot backward against an object and holding for 5-10 sec. Repeat as with quad sets.  Leg Slides: Lying on your back, slowly slide your foot toward your buttocks, bending your knee up off the floor (only go as far as is comfortable). Then slowly slide your foot back down until your leg is flat on the floor again. Angel Wings: Lying on your back spread your legs to the side as far apart as you can without causing discomfort.  A rehabilitation program following serious knee injuries can speed recovery and prevent re-injury in the future due to weakened muscles. Contact your doctor or a physical therapist for more information on knee rehabilitation.   IF YOU ARE TRANSFERRED TO A SKILLED REHAB FACILITY If the patient is transferred to a skilled rehab facility following release from the hospital, a list of the current medications will be sent to the facility for the patient  to  continue.  When discharged from the skilled rehab facility, please have the facility set up the patient's [place] prior to being released. Also, the skilled facility will be responsible for providing the patient with their medications at time of release from the facility to include their pain medication, the muscle relaxants, and their blood thinner medication. If the patient is still at the rehab facility at time of the two week follow up appointment, the skilled rehab facility will also need to assist the patient in arranging follow up appointment in our office and any transportation needs.  MAKE SURE YOU:  Understand these instructions.  Get help right away if you are not doing well or get worse.    Pick up stool softner and laxative for home use following surgery while on pain medications. May shower starting three days after surgery. Please use a clean towel to pat the leg dry following showers. Continue to use ice for pain and swelling after surgery. Do not use any lotions or creams on the incision until instructed by you Start Aspirin immediately following surgery.   Do not put a pillow under the knee. Place it under the heel.   Complete by: As directed    Do not put a pillow under the knee. Place it under the heel.   Complete by: As directed    Driving restrictions   Complete by: As directed    No driving for two weeks   Driving restrictions   Complete by: As directed    No driving for two weeks   For home use only DME 4 wheeled rolling walker with seat   Complete by: As directed    5" wheel, youth rolling walker   Patient needs a walker to treat with the following condition: S/P TKR (total knee replacement), left   TED hose   Complete by: As directed    Use stockings (TED hose) for three weeks on both leg(s).  You may remove them at night for sleeping.   TED hose   Complete by: As directed    Use stockings (TED hose) for three weeks on both leg(s).  You  may remove them at night for sleeping.   Weight bearing as tolerated   Complete by: As directed    Weight bearing as tolerated   Complete by: As directed      Allergies as of 07/19/2019      Reactions   Penicillins Rash      Medication List    STOP taking these medications   aspirin 81 MG tablet     TAKE these medications   acetaminophen 500 MG tablet Commonly known as: TYLENOL Take 1,000 mg by mouth every 6 (six) hours as needed for mild pain.   albuterol 108 (90 Base) MCG/ACT inhaler Commonly known as: Proventil HFA INHALE 2 PUFFS INTO THE LUNGS EVERY 6 (SIX) HOURS AS NEEDED. What changed:   how much to take  how to take this  when to take this  reasons to take this   atorvastatin 80 MG tablet Commonly known as: LIPITOR Take 1 tablet (80 mg total) by mouth daily.   budesonide-formoterol 80-4.5 MCG/ACT inhaler Commonly known as: Symbicort Inhale 2 puffs into the lungs 2 (two) times daily.   cetirizine 10 MG tablet Commonly known as: ZYRTEC Take 10 mg by mouth daily as needed.   clonazePAM 0.5 MG tablet Commonly known as: KLONOPIN Take 0.5 mg by mouth at bedtime as needed for  anxiety.   esomeprazole 40 MG capsule Commonly known as: NexIUM Take 1 capsule 30 min before breakfast.   fish oil-omega-3 fatty acids 1000 MG capsule Take 1 g by mouth daily.   fluticasone 50 MCG/ACT nasal spray Commonly known as: FLONASE Place 2 sprays into both nostrils daily.   HYDROcodone-acetaminophen 7.5-325 MG tablet Commonly known as: NORCO Take 1-2 tablets by mouth every 4 (four) hours as needed for moderate pain or severe pain (1 pill for pain scale 1-5 // 2 pills for pain scale 6-10).   indomethacin 25 MG capsule Commonly known as: INDOCIN Take 1 capsule (25 mg total) by mouth daily as needed (headaches).   methocarbamol 500 MG tablet Commonly known as: Robaxin Take 1 tablet (500 mg total) by mouth 4 (four) times daily.   metoprolol succinate 25 MG 24 hr  tablet Commonly known as: TOPROL-XL Take 1 tablet (25 mg total) by mouth daily.   multivitamin with minerals Tabs tablet Take 1 tablet by mouth daily.   ondansetron 4 MG tablet Commonly known as: Zofran Take 1 tablet (4 mg total) by mouth daily as needed for nausea or vomiting.   ranitidine 150 MG capsule Commonly known as: ZANTAC Take 1 capsule (150 mg total) by mouth every evening.   Synthroid 88 MCG tablet Generic drug: levothyroxine Take 88 mcg by mouth daily.            Durable Medical Equipment  (From admission, onward)         Start     Ordered   07/18/19 1616  For home use only DME Walker rolling  Once    Question Answer Comment  Walker: With 5 Inch Wheels   Patient needs a walker to treat with the following condition S/P left knee surgery      07/18/19 1615   07/18/19 1507  For home use only DME 3 n 1  Once     07/18/19 1506   07/17/19 0000  DME Bedside commode    Question:  Patient needs a bedside commode to treat with the following condition  Answer:  Surgery, elective   07/17/19 0902   07/17/19 0000  For home use only DME 4 wheeled rolling walker with seat    Comments: 5" wheel, youth rolling walker  Question:  Patient needs a walker to treat with the following condition  Answer:  S/P TKR (total knee replacement), left   07/17/19 0902           Discharge Care Instructions  (From admission, onward)         Start     Ordered   07/19/19 0000  Weight bearing as tolerated     07/19/19 1323   07/16/19 0000  Weight bearing as tolerated     07/16/19 1711         Follow-up Information    [facility] Follow up.   Specialty: Home Health Services Why: agency will provide home health physical therapy. agency will call you to schedule first visit. Contact information: [address] [place] [address] [phone]           Signed: Drue Novel 07/19/2019, 1:38 PM

## 2019-07-19 NOTE — Progress Notes (Signed)
Physical Therapy Treatment Patient Details Name: Dominique Mooney MRN: [id number] DOB: 1951-02-18 Today's Date: 07/19/2019    History of Present Illness 69 y.o. female s/p Lt TKA on 07/16/19 with PMH significant for IBS, hypothyroidism, HTN, HLD, GERD, Asthma, OA, pancreatitis, Lt heart cath in 2019.    PT Comments    Pt is progressing with mobility but she remains drowsy. She had some difficulty keeping hey eyes open during session. Reviewed/practiced exercises, gait training, and stair training. Will plan to have a 2nd session prior to possible d/c later today if all goes well. Recommend 24/7 supervision/assist for safety.   Follow Up Recommendations  Follow surgeon's recommendation for DC plan and follow-up therapies; 24 hour supervision/assist     Equipment Recommendations       Recommendations for Other Services       Precautions / Restrictions Precautions Precautions: Fall Restrictions Weight Bearing Restrictions: No Other Position/Activity Restrictions: WBAT    Mobility  Bed Mobility Overal bed mobility: Needs Assistance Bed Mobility: Supine to Sit     Supine to sit: Min assist;HOB elevated     General bed mobility comments: Assist for L LE. Increased time.  Transfers Overall transfer level: Needs assistance Equipment used: Rolling walker (2 wheeled) Transfers: Sit to/from Stand Sit to Stand: Min guard         General transfer comment: Cues for safety, hand/LE placement. Increased time.  Ambulation/Gait Ambulation/Gait assistance: Min assist Gait Distance (Feet): 50 Feet(50'x1, 40'x1) Assistive device: Rolling walker (2 wheeled) Gait Pattern/deviations: Step-to pattern;Step-through pattern;Decreased stride length     General Gait Details: VCs safety, sequence, proper use/distance from RW. Slow gait speed. Took seated rest break after ~50 feet.   Stairs Stairs: Yes Stairs assistance: Min assist Stair Management: Forwards;Two rails;Step to  pattern Number of Stairs: 2 General stair comments: up and over portable stairs. VCs safety, sequence, technique. Assist to steady.   Wheelchair Mobility    Modified Rankin (Stroke Patients Only)       Balance Overall balance assessment: Needs assistance         Standing balance support: Bilateral upper extremity supported Standing balance-Leahy Scale: Poor                              Cognition Arousal/Alertness: Awake/alert Behavior During Therapy: WFL for tasks assessed/performed Overall Cognitive Status: Within Functional Limits for tasks assessed                                        Exercises Total Joint Exercises Ankle Circles/Pumps: AROM;Both;10 reps Quad Sets: AROM;Both;10 reps Heel Slides: AAROM;Left;10 reps Hip ABduction/ADduction: AAROM;Left;10 reps Straight Leg Raises: AAROM;Left;10 reps Knee Flexion: AAROM;Left;10 reps;Seated Goniometric ROM: ~10-55 degrees    General Comments        Pertinent Vitals/Pain Pain Assessment: 0-10 Pain Score: 5  Pain Location: L knee Pain Descriptors / Indicators: Aching;Sore;Discomfort Pain Intervention(s): Monitored during session;Ice applied;Repositioned    Home Living                      Prior Function            PT Goals (current goals can now be found in the care plan section) Progress towards PT goals: Progressing toward goals    Frequency    7X/week      PT Plan Current plan remains  appropriate    Co-evaluation              AM-PAC PT "6 Clicks" Mobility   Outcome Measure  Help needed turning from your back to your side while in a flat bed without using bedrails?: A Little Help needed moving from lying on your back to sitting on the side of a flat bed without using bedrails?: A Little Help needed moving to and from a bed to a chair (including a wheelchair)?: A Little Help needed standing up from a chair using your arms (e.g., wheelchair or  bedside chair)?: A Little Help needed to walk in hospital room?: A Little Help needed climbing 3-5 steps with a railing? : A Little 6 Click Score: 18    End of Session Equipment Utilized During Treatment: Gait belt Activity Tolerance: Patient limited by fatigue Patient left: in chair;with call bell/phone within reach   PT Visit Diagnosis: Other abnormalities of gait and mobility (R26.89);Pain Pain - Right/Left: Left Pain - part of body: Knee     Time: 0913-0950 PT Time Calculation (min) (ACUTE ONLY): 37 min  Charges:  $Gait Training: 8-22 mins $Therapeutic Exercise: 8-22 mins                         Doreatha Massed, PT Acute Rehabilitation

## 2019-07-19 NOTE — Progress Notes (Signed)
Subjective: 3 Days Post-Op Procedure(s) (LRB): TOTAL KNEE ARTHROPLASTY (Left) Patient reports pain as mild.  Reports stiffness feeling in her leg especially with movement.  Reports Nausea is getting better since she has been switched off the oxycodone. Now taking Hydrocodone for pain Denies any vomiting, CP/ SOB, diarrhea or constipation.  + void and flatus Denies calf pain  Patient does seem to be unsure of the day of the week today. Believes that today is Friday  Objective: Vital signs in last 24 hours: Temp:  [98.7 F (37.1 C)-99.4 F (37.4 C)] 99.4 F (37.4 C) (03/01 0526) Pulse Rate:  [83-107] 107 (03/01 0526) Resp:  [16-18] 18 (03/01 0526) BP: (106-157)/(63-90) 140/81 (03/01 0526) SpO2:  [93 %-99 %] 93 % (03/01 0526)  Intake/Output from previous day: 02/28 0701 - 03/01 0700 In: 470 [P.O.:470] Out: 1100 [Urine:1100] Intake/Output this shift: No intake/output data recorded.  Recent Labs    07/16/19 1048  HGB 11.8*   Recent Labs    07/16/19 1048  WBC 10.3  RBC 3.93  HCT 37.0  PLT 281   Recent Labs    07/16/19 1048  CREATININE 0.92   No results for input(s): LABPT, INR in the last 72 hours.  Alert to Person, place, time after I remind her Neurologically intact ABD soft Neurovascular intact Sensation intact distally Intact pulses distally Dorsiflexion/Plantar flexion intact Incision: dressing C/D/I No cellulitis present Compartment soft   Assessment/Plan: 3 Days Post-Op Procedure(s) (LRB): TOTAL KNEE ARTHROPLASTY (Left) Advance diet Up with therapy Discharge home with home health  As long as patient is able to get up with PT and continues to improve then she is okay to get DC home today.  Continue monitoring patients alertness Place TED on operative leg at this time. I did remove the small bandage that was over drain site.  There is an order in the computer for Home health PT, patient does not remember anyone speaking with her in regards to  setting this up. This needs to be done prior to discharge. I spoke with her nurse about this.    Anticipated LOS equal to or greater than 2 midnights due to - Age 67 and older with one or more of the following:  - Obesity  - Expected need for hospital services (PT, OT, Nursing) required for safe  discharge  - Anticipated need for postoperative skilled nursing care or inpatient rehab  - Active co-morbidities: None OR   - Unanticipated findings during/Post Surgery: Slow post-op progression: GI, pain control, mobility  - Patient is a high risk of re-admission due to: None    [employer], PA-C [facility] 07/19/2019, 7:42 AM

## 2019-07-19 NOTE — Progress Notes (Signed)
Physical Therapy Treatment Patient Details Name: Dominique Mooney MRN: [id number] DOB: 1950/08/02 Today's Date: 07/19/2019    History of Present Illness 69 y.o. female s/p Lt TKA on 07/16/19 with PMH significant for IBS, hypothyroidism, HTN, HLD, GERD, Asthma, OA, pancreatitis, Lt heart cath in 2019.    PT Comments    2nd session to continue gait training. Pt did fairly well. She remains drowsy (? Meds). Attempted to make RN aware but unable to locate/speak with her. Pt has met her PT goals today. Highly recommend 24/7 supervision/assist for safety 2* drowsiness/risk for falls.  All education completed.     Follow Up Recommendations  Follow surgeon's recommendation for DC plan and follow-up therapies; 24 hour supervision/assist     Equipment Recommendations       Recommendations for Other Services       Precautions / Restrictions Precautions Precautions: Fall Restrictions Weight Bearing Restrictions: No Other Position/Activity Restrictions: WBAT    Mobility  Bed Mobility Overal bed mobility: Needs Assistance Bed Mobility: Supine to Sit     Supine to sit: Min assist;HOB elevated     General bed mobility comments: Assist for L LE. Increased time.  Transfers Overall transfer level: Needs assistance Equipment used: Rolling walker (2 wheeled) Transfers: Sit to/from Stand Sit to Stand: Min guard         General transfer comment: Cues for safety, hand/LE placement. Increased time.  Ambulation/Gait Ambulation/Gait assistance: Min assist Gait Distance (Feet): 75 Feet Assistive device: Rolling walker (2 wheeled) Gait Pattern/deviations: Step-to pattern;Step-through pattern;Decreased stride length     General Gait Details: VCs safety, sequence, proper use/distance from RW. Slow gait speed. Assist to stabilize intermittently   Stairs        Wheelchair Mobility    Modified Rankin (Stroke Patients Only)       Balance Overall balance assessment: Needs  assistance         Standing balance support: Bilateral upper extremity supported Standing balance-Leahy Scale: Poor                              Cognition Arousal/Alertness: Awake/alert Behavior During Therapy: WFL for tasks assessed/performed Overall Cognitive Status: Within Functional Limits for tasks assessed                                        Exercises Total Joint Exercises Ankle Circles/Pumps: AROM;Both;10 reps Quad Sets: AROM;Both;10 reps Heel Slides: AAROM;Left;10 reps Hip ABduction/ADduction: AAROM;Left;10 reps Straight Leg Raises: AAROM;Left;10 reps Knee Flexion: AAROM;Left;10 reps;Seated Goniometric ROM: ~10-55 degrees    General Comments        Pertinent Vitals/Pain Pain Assessment: 0-10 Pain Score: 5  Pain Location: L knee Pain Descriptors / Indicators: Aching;Sore;Discomfort Pain Intervention(s): Monitored during session;Repositioned    Home Living                      Prior Function            PT Goals (current goals can now be found in the care plan section) Progress towards PT goals: Progressing toward goals    Frequency    7X/week      PT Plan Current plan remains appropriate    Co-evaluation              AM-PAC PT "6 Clicks" Mobility   Outcome Measure  Help needed  turning from your back to your side while in a flat bed without using bedrails?: A Little Help needed moving from lying on your back to sitting on the side of a flat bed without using bedrails?: A Little Help needed moving to and from a bed to a chair (including a wheelchair)?: A Little Help needed standing up from a chair using your arms (e.g., wheelchair or bedside chair)?: A Little Help needed to walk in hospital room?: A Little Help needed climbing 3-5 steps with a railing? : A Little 6 Click Score: 18    End of Session Equipment Utilized During Treatment: Gait belt Activity Tolerance: Patient tolerated treatment  well Patient left: in chair;with call bell/phone within reach   PT Visit Diagnosis: Other abnormalities of gait and mobility (R26.89) Pain - Right/Left: Left Pain - part of body: Knee     Time: 0375-4360 PT Time Calculation (min) (ACUTE ONLY): 11 min  Charges:  $Gait Training: 8-22 mins $Therapeutic Exercise: 8-22 mins                         Doreatha Massed, PT Acute Rehabilitation

## 2019-07-21 DIAGNOSIS — Z9181 History of falling: Secondary | ICD-10-CM | POA: Diagnosis not present

## 2019-07-21 DIAGNOSIS — J4530 Mild persistent asthma, uncomplicated: Secondary | ICD-10-CM | POA: Diagnosis not present

## 2019-07-21 DIAGNOSIS — Z471 Aftercare following joint replacement surgery: Secondary | ICD-10-CM | POA: Diagnosis not present

## 2019-07-21 DIAGNOSIS — I10 Essential (primary) hypertension: Secondary | ICD-10-CM | POA: Diagnosis not present

## 2019-07-21 DIAGNOSIS — K589 Irritable bowel syndrome without diarrhea: Secondary | ICD-10-CM | POA: Diagnosis not present

## 2019-07-21 DIAGNOSIS — K219 Gastro-esophageal reflux disease without esophagitis: Secondary | ICD-10-CM | POA: Diagnosis not present

## 2019-07-21 DIAGNOSIS — E039 Hypothyroidism, unspecified: Secondary | ICD-10-CM | POA: Diagnosis not present

## 2019-07-21 DIAGNOSIS — Z96652 Presence of left artificial knee joint: Secondary | ICD-10-CM | POA: Diagnosis not present

## 2019-07-21 DIAGNOSIS — Z7951 Long term (current) use of inhaled steroids: Secondary | ICD-10-CM | POA: Diagnosis not present

## 2019-07-21 DIAGNOSIS — I2510 Atherosclerotic heart disease of native coronary artery without angina pectoris: Secondary | ICD-10-CM | POA: Diagnosis not present

## 2019-07-21 DIAGNOSIS — E785 Hyperlipidemia, unspecified: Secondary | ICD-10-CM | POA: Diagnosis not present

## 2019-07-23 DIAGNOSIS — K219 Gastro-esophageal reflux disease without esophagitis: Secondary | ICD-10-CM | POA: Diagnosis not present

## 2019-07-23 DIAGNOSIS — Z471 Aftercare following joint replacement surgery: Secondary | ICD-10-CM | POA: Diagnosis not present

## 2019-07-23 DIAGNOSIS — I10 Essential (primary) hypertension: Secondary | ICD-10-CM | POA: Diagnosis not present

## 2019-07-23 DIAGNOSIS — E785 Hyperlipidemia, unspecified: Secondary | ICD-10-CM | POA: Diagnosis not present

## 2019-07-23 DIAGNOSIS — Z7951 Long term (current) use of inhaled steroids: Secondary | ICD-10-CM | POA: Diagnosis not present

## 2019-07-23 DIAGNOSIS — J4530 Mild persistent asthma, uncomplicated: Secondary | ICD-10-CM | POA: Diagnosis not present

## 2019-07-23 DIAGNOSIS — I2510 Atherosclerotic heart disease of native coronary artery without angina pectoris: Secondary | ICD-10-CM | POA: Diagnosis not present

## 2019-07-23 DIAGNOSIS — E039 Hypothyroidism, unspecified: Secondary | ICD-10-CM | POA: Diagnosis not present

## 2019-07-23 DIAGNOSIS — Z9181 History of falling: Secondary | ICD-10-CM | POA: Diagnosis not present

## 2019-07-23 DIAGNOSIS — Z96652 Presence of left artificial knee joint: Secondary | ICD-10-CM | POA: Diagnosis not present

## 2019-07-23 DIAGNOSIS — K589 Irritable bowel syndrome without diarrhea: Secondary | ICD-10-CM | POA: Diagnosis not present

## 2019-07-26 ENCOUNTER — Other Ambulatory Visit (HOSPITAL_BASED_OUTPATIENT_CLINIC_OR_DEPARTMENT_OTHER): Payer: Self-pay | Admitting: Specialist

## 2019-07-26 ENCOUNTER — Ambulatory Visit (HOSPITAL_BASED_OUTPATIENT_CLINIC_OR_DEPARTMENT_OTHER)
Admission: RE | Admit: 2019-07-26 | Discharge: 2019-07-26 | Disposition: A | Discharge status: Home / Self Care | Payer: Medicare Other | Source: Ambulatory Visit | Attending: Specialist | Admitting: Specialist

## 2019-07-26 ENCOUNTER — Other Ambulatory Visit: Payer: Self-pay

## 2019-07-26 ENCOUNTER — Emergency Department (HOSPITAL_BASED_OUTPATIENT_CLINIC_OR_DEPARTMENT_OTHER)
Admission: EM | Admit: 2019-07-26 | Discharge: 2019-07-26 | Discharge status: Absent without leave | Payer: Medicare Other

## 2019-07-26 DIAGNOSIS — Z96652 Presence of left artificial knee joint: Secondary | ICD-10-CM | POA: Diagnosis not present

## 2019-07-26 DIAGNOSIS — K219 Gastro-esophageal reflux disease without esophagitis: Secondary | ICD-10-CM | POA: Diagnosis not present

## 2019-07-26 DIAGNOSIS — M79662 Pain in left lower leg: Secondary | ICD-10-CM | POA: Diagnosis not present

## 2019-07-26 DIAGNOSIS — E039 Hypothyroidism, unspecified: Secondary | ICD-10-CM | POA: Diagnosis not present

## 2019-07-26 DIAGNOSIS — J4530 Mild persistent asthma, uncomplicated: Secondary | ICD-10-CM | POA: Diagnosis not present

## 2019-07-26 DIAGNOSIS — M79605 Pain in left leg: Secondary | ICD-10-CM | POA: Diagnosis not present

## 2019-07-26 DIAGNOSIS — Z7951 Long term (current) use of inhaled steroids: Secondary | ICD-10-CM | POA: Diagnosis not present

## 2019-07-26 DIAGNOSIS — K589 Irritable bowel syndrome without diarrhea: Secondary | ICD-10-CM | POA: Diagnosis not present

## 2019-07-26 DIAGNOSIS — I10 Essential (primary) hypertension: Secondary | ICD-10-CM | POA: Diagnosis not present

## 2019-07-26 DIAGNOSIS — E785 Hyperlipidemia, unspecified: Secondary | ICD-10-CM | POA: Diagnosis not present

## 2019-07-26 DIAGNOSIS — Z9181 History of falling: Secondary | ICD-10-CM | POA: Diagnosis not present

## 2019-07-26 DIAGNOSIS — I2510 Atherosclerotic heart disease of native coronary artery without angina pectoris: Secondary | ICD-10-CM | POA: Diagnosis not present

## 2019-07-26 DIAGNOSIS — Z471 Aftercare following joint replacement surgery: Secondary | ICD-10-CM | POA: Diagnosis not present

## 2019-07-26 NOTE — Patient Outreach (Signed)
Red Emmi: Reason for alert: lost interest in things- yes  Reviewed Red Emmi, placed call to patient with no answer. Left a message requesting a call back.  PLAN: will mail outreach letter and attempt again on next business day.  Tomasa Rand, RN, BSN, CEN [facility] [employer] [phone]

## 2019-07-27 ENCOUNTER — Other Ambulatory Visit: Payer: Self-pay

## 2019-07-27 NOTE — Patient Outreach (Signed)
Red Emmi:  Placed 2nd call to patient who answered and reports she answered telephone emmi call incorrectly. Reports no depression. Denies any concerns today.  PLAN: case closure, no needs identified.   Tomasa Rand, RN, BSN, CEN [facility] [employer] [phone]

## 2019-07-28 DIAGNOSIS — Z9181 History of falling: Secondary | ICD-10-CM | POA: Diagnosis not present

## 2019-07-28 DIAGNOSIS — E785 Hyperlipidemia, unspecified: Secondary | ICD-10-CM | POA: Diagnosis not present

## 2019-07-28 DIAGNOSIS — I10 Essential (primary) hypertension: Secondary | ICD-10-CM | POA: Diagnosis not present

## 2019-07-28 DIAGNOSIS — Z471 Aftercare following joint replacement surgery: Secondary | ICD-10-CM | POA: Diagnosis not present

## 2019-07-28 DIAGNOSIS — J4530 Mild persistent asthma, uncomplicated: Secondary | ICD-10-CM | POA: Diagnosis not present

## 2019-07-28 DIAGNOSIS — Z96652 Presence of left artificial knee joint: Secondary | ICD-10-CM | POA: Diagnosis not present

## 2019-07-28 DIAGNOSIS — K589 Irritable bowel syndrome without diarrhea: Secondary | ICD-10-CM | POA: Diagnosis not present

## 2019-07-28 DIAGNOSIS — Z7951 Long term (current) use of inhaled steroids: Secondary | ICD-10-CM | POA: Diagnosis not present

## 2019-07-28 DIAGNOSIS — K219 Gastro-esophageal reflux disease without esophagitis: Secondary | ICD-10-CM | POA: Diagnosis not present

## 2019-07-28 DIAGNOSIS — E039 Hypothyroidism, unspecified: Secondary | ICD-10-CM | POA: Diagnosis not present

## 2019-07-28 DIAGNOSIS — I2510 Atherosclerotic heart disease of native coronary artery without angina pectoris: Secondary | ICD-10-CM | POA: Diagnosis not present

## 2019-07-29 DIAGNOSIS — Z96652 Presence of left artificial knee joint: Secondary | ICD-10-CM | POA: Diagnosis not present

## 2019-07-29 DIAGNOSIS — Z471 Aftercare following joint replacement surgery: Secondary | ICD-10-CM | POA: Diagnosis not present

## 2019-07-30 DIAGNOSIS — Z7951 Long term (current) use of inhaled steroids: Secondary | ICD-10-CM | POA: Diagnosis not present

## 2019-07-30 DIAGNOSIS — Z471 Aftercare following joint replacement surgery: Secondary | ICD-10-CM | POA: Diagnosis not present

## 2019-07-30 DIAGNOSIS — E039 Hypothyroidism, unspecified: Secondary | ICD-10-CM | POA: Diagnosis not present

## 2019-07-30 DIAGNOSIS — I10 Essential (primary) hypertension: Secondary | ICD-10-CM | POA: Diagnosis not present

## 2019-07-30 DIAGNOSIS — J4530 Mild persistent asthma, uncomplicated: Secondary | ICD-10-CM | POA: Diagnosis not present

## 2019-07-30 DIAGNOSIS — Z9181 History of falling: Secondary | ICD-10-CM | POA: Diagnosis not present

## 2019-07-30 DIAGNOSIS — I2510 Atherosclerotic heart disease of native coronary artery without angina pectoris: Secondary | ICD-10-CM | POA: Diagnosis not present

## 2019-07-30 DIAGNOSIS — K589 Irritable bowel syndrome without diarrhea: Secondary | ICD-10-CM | POA: Diagnosis not present

## 2019-07-30 DIAGNOSIS — K219 Gastro-esophageal reflux disease without esophagitis: Secondary | ICD-10-CM | POA: Diagnosis not present

## 2019-07-30 DIAGNOSIS — Z96652 Presence of left artificial knee joint: Secondary | ICD-10-CM | POA: Diagnosis not present

## 2019-07-30 DIAGNOSIS — E785 Hyperlipidemia, unspecified: Secondary | ICD-10-CM | POA: Diagnosis not present

## 2019-08-02 DIAGNOSIS — Z471 Aftercare following joint replacement surgery: Secondary | ICD-10-CM | POA: Diagnosis not present

## 2019-08-02 DIAGNOSIS — E039 Hypothyroidism, unspecified: Secondary | ICD-10-CM | POA: Diagnosis not present

## 2019-08-02 DIAGNOSIS — J4530 Mild persistent asthma, uncomplicated: Secondary | ICD-10-CM | POA: Diagnosis not present

## 2019-08-02 DIAGNOSIS — Z9181 History of falling: Secondary | ICD-10-CM | POA: Diagnosis not present

## 2019-08-02 DIAGNOSIS — I2510 Atherosclerotic heart disease of native coronary artery without angina pectoris: Secondary | ICD-10-CM | POA: Diagnosis not present

## 2019-08-02 DIAGNOSIS — K219 Gastro-esophageal reflux disease without esophagitis: Secondary | ICD-10-CM | POA: Diagnosis not present

## 2019-08-02 DIAGNOSIS — Z96652 Presence of left artificial knee joint: Secondary | ICD-10-CM | POA: Diagnosis not present

## 2019-08-02 DIAGNOSIS — I10 Essential (primary) hypertension: Secondary | ICD-10-CM | POA: Diagnosis not present

## 2019-08-02 DIAGNOSIS — Z7951 Long term (current) use of inhaled steroids: Secondary | ICD-10-CM | POA: Diagnosis not present

## 2019-08-02 DIAGNOSIS — E785 Hyperlipidemia, unspecified: Secondary | ICD-10-CM | POA: Diagnosis not present

## 2019-08-02 DIAGNOSIS — K589 Irritable bowel syndrome without diarrhea: Secondary | ICD-10-CM | POA: Diagnosis not present

## 2019-08-04 DIAGNOSIS — Z471 Aftercare following joint replacement surgery: Secondary | ICD-10-CM | POA: Diagnosis not present

## 2019-08-04 DIAGNOSIS — Z96652 Presence of left artificial knee joint: Secondary | ICD-10-CM | POA: Diagnosis not present

## 2019-08-04 DIAGNOSIS — E039 Hypothyroidism, unspecified: Secondary | ICD-10-CM | POA: Diagnosis not present

## 2019-08-04 DIAGNOSIS — J4530 Mild persistent asthma, uncomplicated: Secondary | ICD-10-CM | POA: Diagnosis not present

## 2019-08-04 DIAGNOSIS — K589 Irritable bowel syndrome without diarrhea: Secondary | ICD-10-CM | POA: Diagnosis not present

## 2019-08-04 DIAGNOSIS — E785 Hyperlipidemia, unspecified: Secondary | ICD-10-CM | POA: Diagnosis not present

## 2019-08-04 DIAGNOSIS — Z9181 History of falling: Secondary | ICD-10-CM | POA: Diagnosis not present

## 2019-08-04 DIAGNOSIS — Z7951 Long term (current) use of inhaled steroids: Secondary | ICD-10-CM | POA: Diagnosis not present

## 2019-08-04 DIAGNOSIS — I2510 Atherosclerotic heart disease of native coronary artery without angina pectoris: Secondary | ICD-10-CM | POA: Diagnosis not present

## 2019-08-04 DIAGNOSIS — I10 Essential (primary) hypertension: Secondary | ICD-10-CM | POA: Diagnosis not present

## 2019-08-04 DIAGNOSIS — K219 Gastro-esophageal reflux disease without esophagitis: Secondary | ICD-10-CM | POA: Diagnosis not present

## 2019-08-05 DIAGNOSIS — K219 Gastro-esophageal reflux disease without esophagitis: Secondary | ICD-10-CM | POA: Diagnosis not present

## 2019-08-05 DIAGNOSIS — Z7951 Long term (current) use of inhaled steroids: Secondary | ICD-10-CM | POA: Diagnosis not present

## 2019-08-05 DIAGNOSIS — Z96652 Presence of left artificial knee joint: Secondary | ICD-10-CM | POA: Diagnosis not present

## 2019-08-05 DIAGNOSIS — I10 Essential (primary) hypertension: Secondary | ICD-10-CM | POA: Diagnosis not present

## 2019-08-05 DIAGNOSIS — I2510 Atherosclerotic heart disease of native coronary artery without angina pectoris: Secondary | ICD-10-CM | POA: Diagnosis not present

## 2019-08-05 DIAGNOSIS — Z471 Aftercare following joint replacement surgery: Secondary | ICD-10-CM | POA: Diagnosis not present

## 2019-08-05 DIAGNOSIS — J4530 Mild persistent asthma, uncomplicated: Secondary | ICD-10-CM | POA: Diagnosis not present

## 2019-08-05 DIAGNOSIS — E785 Hyperlipidemia, unspecified: Secondary | ICD-10-CM | POA: Diagnosis not present

## 2019-08-05 DIAGNOSIS — K589 Irritable bowel syndrome without diarrhea: Secondary | ICD-10-CM | POA: Diagnosis not present

## 2019-08-05 DIAGNOSIS — Z9181 History of falling: Secondary | ICD-10-CM | POA: Diagnosis not present

## 2019-08-05 DIAGNOSIS — E039 Hypothyroidism, unspecified: Secondary | ICD-10-CM | POA: Diagnosis not present

## 2019-08-06 DIAGNOSIS — M25612 Stiffness of left shoulder, not elsewhere classified: Secondary | ICD-10-CM | POA: Diagnosis not present

## 2019-08-10 DIAGNOSIS — M25612 Stiffness of left shoulder, not elsewhere classified: Secondary | ICD-10-CM | POA: Diagnosis not present

## 2019-08-12 DIAGNOSIS — M25662 Stiffness of left knee, not elsewhere classified: Secondary | ICD-10-CM | POA: Diagnosis not present

## 2019-08-13 DIAGNOSIS — M25662 Stiffness of left knee, not elsewhere classified: Secondary | ICD-10-CM | POA: Diagnosis not present

## 2019-08-16 DIAGNOSIS — M25662 Stiffness of left knee, not elsewhere classified: Secondary | ICD-10-CM | POA: Diagnosis not present

## 2019-08-18 DIAGNOSIS — M25662 Stiffness of left knee, not elsewhere classified: Secondary | ICD-10-CM | POA: Diagnosis not present

## 2019-08-20 DIAGNOSIS — M25662 Stiffness of left knee, not elsewhere classified: Secondary | ICD-10-CM | POA: Diagnosis not present

## 2019-08-24 DIAGNOSIS — M25662 Stiffness of left knee, not elsewhere classified: Secondary | ICD-10-CM | POA: Diagnosis not present

## 2019-08-26 DIAGNOSIS — M25662 Stiffness of left knee, not elsewhere classified: Secondary | ICD-10-CM | POA: Diagnosis not present

## 2019-08-31 DIAGNOSIS — M25662 Stiffness of left knee, not elsewhere classified: Secondary | ICD-10-CM | POA: Diagnosis not present

## 2019-09-02 DIAGNOSIS — M25662 Stiffness of left knee, not elsewhere classified: Secondary | ICD-10-CM | POA: Diagnosis not present

## 2019-09-06 DIAGNOSIS — H401411 Capsular glaucoma with pseudoexfoliation of lens, right eye, mild stage: Secondary | ICD-10-CM | POA: Diagnosis not present

## 2019-09-06 DIAGNOSIS — H43813 Vitreous degeneration, bilateral: Secondary | ICD-10-CM | POA: Diagnosis not present

## 2019-09-06 DIAGNOSIS — H40012 Open angle with borderline findings, low risk, left eye: Secondary | ICD-10-CM | POA: Diagnosis not present

## 2019-09-06 DIAGNOSIS — H35013 Changes in retinal vascular appearance, bilateral: Secondary | ICD-10-CM | POA: Diagnosis not present

## 2019-09-06 DIAGNOSIS — H26492 Other secondary cataract, left eye: Secondary | ICD-10-CM | POA: Diagnosis not present

## 2019-09-07 DIAGNOSIS — M25662 Stiffness of left knee, not elsewhere classified: Secondary | ICD-10-CM | POA: Diagnosis not present

## 2019-09-09 DIAGNOSIS — M25662 Stiffness of left knee, not elsewhere classified: Secondary | ICD-10-CM | POA: Diagnosis not present

## 2019-09-13 DIAGNOSIS — E559 Vitamin D deficiency, unspecified: Secondary | ICD-10-CM | POA: Diagnosis not present

## 2019-09-13 DIAGNOSIS — I129 Hypertensive chronic kidney disease with stage 1 through stage 4 chronic kidney disease, or unspecified chronic kidney disease: Secondary | ICD-10-CM | POA: Diagnosis not present

## 2019-09-13 DIAGNOSIS — E785 Hyperlipidemia, unspecified: Secondary | ICD-10-CM | POA: Diagnosis not present

## 2019-09-13 DIAGNOSIS — E039 Hypothyroidism, unspecified: Secondary | ICD-10-CM | POA: Diagnosis not present

## 2019-09-13 DIAGNOSIS — E1122 Type 2 diabetes mellitus with diabetic chronic kidney disease: Secondary | ICD-10-CM | POA: Diagnosis not present

## 2019-09-13 DIAGNOSIS — R748 Abnormal levels of other serum enzymes: Secondary | ICD-10-CM | POA: Diagnosis not present

## 2019-09-14 DIAGNOSIS — M25662 Stiffness of left knee, not elsewhere classified: Secondary | ICD-10-CM | POA: Diagnosis not present

## 2019-09-16 DIAGNOSIS — E039 Hypothyroidism, unspecified: Secondary | ICD-10-CM | POA: Diagnosis not present

## 2019-09-16 DIAGNOSIS — E785 Hyperlipidemia, unspecified: Secondary | ICD-10-CM | POA: Diagnosis not present

## 2019-09-16 DIAGNOSIS — Z0000 Encounter for general adult medical examination without abnormal findings: Secondary | ICD-10-CM | POA: Diagnosis not present

## 2019-09-17 DIAGNOSIS — M25662 Stiffness of left knee, not elsewhere classified: Secondary | ICD-10-CM | POA: Diagnosis not present

## 2019-09-21 DIAGNOSIS — M25662 Stiffness of left knee, not elsewhere classified: Secondary | ICD-10-CM | POA: Diagnosis not present

## 2019-09-23 DIAGNOSIS — M25662 Stiffness of left knee, not elsewhere classified: Secondary | ICD-10-CM | POA: Diagnosis not present

## 2019-09-28 DIAGNOSIS — M25662 Stiffness of left knee, not elsewhere classified: Secondary | ICD-10-CM | POA: Diagnosis not present

## 2019-09-29 DIAGNOSIS — Z96652 Presence of left artificial knee joint: Secondary | ICD-10-CM | POA: Diagnosis not present

## 2019-09-29 DIAGNOSIS — Z471 Aftercare following joint replacement surgery: Secondary | ICD-10-CM | POA: Diagnosis not present

## 2019-09-30 DIAGNOSIS — G8918 Other acute postprocedural pain: Secondary | ICD-10-CM | POA: Diagnosis not present

## 2019-09-30 DIAGNOSIS — M24662 Ankylosis, left knee: Secondary | ICD-10-CM | POA: Diagnosis not present

## 2019-09-30 DIAGNOSIS — Z96652 Presence of left artificial knee joint: Secondary | ICD-10-CM | POA: Diagnosis not present

## 2019-09-30 DIAGNOSIS — T8482XA Fibrosis due to internal orthopedic prosthetic devices, implants and grafts, initial encounter: Secondary | ICD-10-CM | POA: Diagnosis not present

## 2019-10-05 DIAGNOSIS — M25662 Stiffness of left knee, not elsewhere classified: Secondary | ICD-10-CM | POA: Diagnosis not present

## 2019-10-08 DIAGNOSIS — M25662 Stiffness of left knee, not elsewhere classified: Secondary | ICD-10-CM | POA: Diagnosis not present

## 2019-10-12 DIAGNOSIS — M25662 Stiffness of left knee, not elsewhere classified: Secondary | ICD-10-CM | POA: Diagnosis not present

## 2019-10-13 DIAGNOSIS — Z96652 Presence of left artificial knee joint: Secondary | ICD-10-CM | POA: Diagnosis not present

## 2019-10-19 DIAGNOSIS — M25662 Stiffness of left knee, not elsewhere classified: Secondary | ICD-10-CM | POA: Diagnosis not present

## 2019-10-21 DIAGNOSIS — M25662 Stiffness of left knee, not elsewhere classified: Secondary | ICD-10-CM | POA: Diagnosis not present

## 2019-10-25 DIAGNOSIS — M25662 Stiffness of left knee, not elsewhere classified: Secondary | ICD-10-CM | POA: Diagnosis not present

## 2019-10-29 DIAGNOSIS — M25662 Stiffness of left knee, not elsewhere classified: Secondary | ICD-10-CM | POA: Diagnosis not present

## 2019-11-01 DIAGNOSIS — M25562 Pain in left knee: Secondary | ICD-10-CM | POA: Diagnosis not present

## 2019-11-10 DIAGNOSIS — M25662 Stiffness of left knee, not elsewhere classified: Secondary | ICD-10-CM | POA: Diagnosis not present

## 2019-12-03 ENCOUNTER — Emergency Department (HOSPITAL_COMMUNITY): Payer: Medicare Other

## 2019-12-03 ENCOUNTER — Emergency Department (HOSPITAL_COMMUNITY)
Admission: EM | Admit: 2019-12-03 | Discharge: 2019-12-04 | Disposition: A | Discharge status: Home / Self Care | Payer: Medicare Other | Attending: Emergency Medicine | Admitting: Emergency Medicine

## 2019-12-03 ENCOUNTER — Encounter (HOSPITAL_COMMUNITY): Payer: Self-pay | Admitting: Emergency Medicine

## 2019-12-03 ENCOUNTER — Other Ambulatory Visit: Payer: Self-pay

## 2019-12-03 ENCOUNTER — Ambulatory Visit (INDEPENDENT_AMBULATORY_CARE_PROVIDER_SITE_OTHER)
Admission: EM | Admit: 2019-12-03 | Discharge: 2019-12-03 | Disposition: A | Discharge status: Home / Self Care | Payer: Medicare Other | Source: Home / Self Care

## 2019-12-03 ENCOUNTER — Telehealth: Payer: Self-pay | Admitting: Gastroenterology

## 2019-12-03 DIAGNOSIS — I2510 Atherosclerotic heart disease of native coronary artery without angina pectoris: Secondary | ICD-10-CM | POA: Insufficient documentation

## 2019-12-03 DIAGNOSIS — R140 Abdominal distension (gaseous): Secondary | ICD-10-CM

## 2019-12-03 DIAGNOSIS — J45909 Unspecified asthma, uncomplicated: Secondary | ICD-10-CM | POA: Insufficient documentation

## 2019-12-03 DIAGNOSIS — R110 Nausea: Secondary | ICD-10-CM

## 2019-12-03 DIAGNOSIS — R519 Headache, unspecified: Secondary | ICD-10-CM | POA: Diagnosis not present

## 2019-12-03 DIAGNOSIS — R1011 Right upper quadrant pain: Secondary | ICD-10-CM

## 2019-12-03 DIAGNOSIS — R945 Abnormal results of liver function studies: Secondary | ICD-10-CM | POA: Diagnosis not present

## 2019-12-03 DIAGNOSIS — Z96652 Presence of left artificial knee joint: Secondary | ICD-10-CM | POA: Insufficient documentation

## 2019-12-03 DIAGNOSIS — K219 Gastro-esophageal reflux disease without esophagitis: Secondary | ICD-10-CM | POA: Insufficient documentation

## 2019-12-03 DIAGNOSIS — R10811 Right upper quadrant abdominal tenderness: Secondary | ICD-10-CM | POA: Diagnosis not present

## 2019-12-03 DIAGNOSIS — Z9889 Other specified postprocedural states: Secondary | ICD-10-CM

## 2019-12-03 DIAGNOSIS — K5900 Constipation, unspecified: Secondary | ICD-10-CM

## 2019-12-03 DIAGNOSIS — E039 Hypothyroidism, unspecified: Secondary | ICD-10-CM | POA: Diagnosis not present

## 2019-12-03 DIAGNOSIS — R10816 Epigastric abdominal tenderness: Secondary | ICD-10-CM | POA: Insufficient documentation

## 2019-12-03 DIAGNOSIS — I10 Essential (primary) hypertension: Secondary | ICD-10-CM | POA: Diagnosis not present

## 2019-12-03 DIAGNOSIS — R1084 Generalized abdominal pain: Secondary | ICD-10-CM | POA: Diagnosis not present

## 2019-12-03 DIAGNOSIS — Z79899 Other long term (current) drug therapy: Secondary | ICD-10-CM | POA: Insufficient documentation

## 2019-12-03 DIAGNOSIS — Z7951 Long term (current) use of inhaled steroids: Secondary | ICD-10-CM | POA: Insufficient documentation

## 2019-12-03 DIAGNOSIS — R748 Abnormal levels of other serum enzymes: Secondary | ICD-10-CM | POA: Diagnosis not present

## 2019-12-03 DIAGNOSIS — R5381 Other malaise: Secondary | ICD-10-CM

## 2019-12-03 DIAGNOSIS — K759 Inflammatory liver disease, unspecified: Secondary | ICD-10-CM | POA: Diagnosis not present

## 2019-12-03 DIAGNOSIS — R109 Unspecified abdominal pain: Secondary | ICD-10-CM | POA: Diagnosis present

## 2019-12-03 DIAGNOSIS — K760 Fatty (change of) liver, not elsewhere classified: Secondary | ICD-10-CM | POA: Diagnosis not present

## 2019-12-03 LAB — COMPREHENSIVE METABOLIC PANEL WITH GFR
ALT: 379 U/L — ABNORMAL HIGH (ref 0–44)
AST: 964 U/L — ABNORMAL HIGH (ref 15–41)
Albumin: 3.5 g/dL (ref 3.5–5.0)
Alkaline Phosphatase: 290 U/L — ABNORMAL HIGH (ref 38–126)
Anion gap: 10 (ref 5–15)
BUN: 9 mg/dL (ref 8–23)
CO2: 26 mmol/L (ref 22–32)
Calcium: 9.3 mg/dL (ref 8.9–10.3)
Chloride: 104 mmol/L (ref 98–111)
Creatinine, Ser: 0.91 mg/dL (ref 0.44–1.00)
GFR calc Af Amer: >60 mL/min
GFR calc non Af Amer: >60 mL/min
Glucose, Bld: 123 mg/dL — ABNORMAL HIGH (ref 70–99)
Potassium: 3.4 mmol/L — ABNORMAL LOW (ref 3.5–5.1)
Sodium: 140 mmol/L (ref 135–145)
Total Bilirubin: 2.3 mg/dL — ABNORMAL HIGH (ref 0.3–1.2)
Total Protein: 8.2 g/dL — ABNORMAL HIGH (ref 6.5–8.1)

## 2019-12-03 LAB — CBC
HCT: 39.9 % (ref 36.0–46.0)
Hemoglobin: 12.9 g/dL (ref 12.0–15.0)
MCH: 29.6 pg (ref 26.0–34.0)
MCHC: 32.3 g/dL (ref 30.0–36.0)
MCV: 91.5 fL (ref 80.0–100.0)
Platelets: 272 K/uL (ref 150–400)
RBC: 4.36 MIL/uL (ref 3.87–5.11)
RDW: 16.4 % — ABNORMAL HIGH (ref 11.5–15.5)
WBC: 7.9 K/uL (ref 4.0–10.5)
nRBC: 0 % (ref 0.0–0.2)

## 2019-12-03 LAB — LIPASE, BLOOD: Lipase: 54 U/L — ABNORMAL HIGH (ref 11–51)

## 2019-12-03 NOTE — ED Triage Notes (Signed)
Pt c/o abdominal pain, constipation and bloating x 1 month  Pt has taken dulcolax and mag citrate over the last month without relief

## 2019-12-03 NOTE — ED Provider Notes (Signed)
[facility] DEPT Provider Note   CSN: [id number] Arrival date & time: 12/03/19  1818     History Chief Complaint  Patient presents with  . Abdominal Pain    Dominique Mooney is a 69 y.o. female.   Abdominal Pain Pain location:  R flank and RUQ Pain quality: aching and bloating   Pain radiates to:  Does not radiate Pain severity:  Moderate Onset quality:  Gradual Timing:  Constant Progression:  Worsening Chronicity:  New Relieved by:  Nothing Worsened by:  Nothing Ineffective treatments:  None tried Associated symptoms: anorexia, constipation and nausea   Associated symptoms: no chest pain, no chills, no cough, no diarrhea, no dysuria, no fever, no shortness of breath and no vomiting        Past Medical History:  Diagnosis Date  . Allergic rhinitis       . Arthritis   . Asthma       . Chest pressure    a. Normal Stress Echo 05/2010  . Complication of anesthesia   . GERD (gastroesophageal reflux disease)   . Hyperlipidemia   . Hypertension   . Hypothyroidism   . Irritable bowel syndrome   . Pancreatitis    after ERCP  . Pneumonia   . PONV (postoperative nausea and vomiting)     Patient Active Problem List   Diagnosis Date Noted  . Osteoarthritis of left knee 07/16/2019  . Coronary artery calcification seen on CT scan 06/26/2017  . CAD in native artery 06/26/2017  . Cough 11/26/2016  . Chest pain 11/26/2016  . Abnormal mammogram 10/19/2012  . Mastodynia 06/01/2012  . ESSENTIAL HYPERTENSION, BENIGN 05/28/2010  . Mild persistent asthma 08/02/2008  . Allergic rhinitis 01/05/2008  . GERD 10/09/2007  . CONSTIPATION 10/09/2007  . IRRITABLE BOWEL SYNDROME 10/09/2007  . HYPOTHYROIDISM 10/07/2007  . HYPERLIPIDEMIA 10/07/2007  . PANCREATITIS 10/07/2007    Past Surgical History:  Procedure Laterality Date  . BRAVO [place] STUDY  05/02/2011   Procedure: BRAVO [place] STUDY;  Surgeon: Owens Loffler, MD;  Location: [facility];  Service:  Endoscopy;  Laterality: N/A;  48 hour wirless pH test (Bravo Test)  . CHOLECYSTECTOMY    . ESOPHAGOGASTRODUODENOSCOPY  05/02/2011   Procedure: ESOPHAGOGASTRODUODENOSCOPY (EGD);  Surgeon: Owens Loffler, MD;  Location: [facility];  Service: Endoscopy;  Laterality: N/A;  . HERNIA REPAIR    . LEFT HEART CATH AND CORONARY ANGIOGRAPHY N/A 06/26/2017   Procedure: LEFT HEART CATH AND CORONARY ANGIOGRAPHY;  Surgeon: Belva Crome, MD;  Location: [facility];  Service: Cardiovascular;  Laterality: N/A;  . MENISCUS REPAIR Left 03/18/2016   and repair of cracked bone  . SALPINGOOPHORECTOMY    . TOTAL ABDOMINAL HYSTERECTOMY    . TOTAL KNEE ARTHROPLASTY Left 07/16/2019   Procedure: TOTAL KNEE ARTHROPLASTY;  Surgeon: Sydnee Cabal, MD;  Location: [facility];  Service: Orthopedics;  Laterality: Left;     OB History   No obstetric history on file.     Family History  Problem Relation Age of Onset  . Stroke Mother   . Stroke Father   . Allergies Brother   . Allergies Sister   . Colon polyps Sister   . Heart attack Brother   . Heart attack Brother   . Allergies Son   . Colon cancer Neg Hx   . Stomach cancer Neg Hx   . Esophageal cancer Neg Hx   . Lung disease Neg Hx   . Cancer Neg Hx   .  Rectal cancer Neg Hx     Social History   Tobacco Use  . Smoking status: Never Smoker  . Smokeless tobacco: Never Used  Vaping Use  . Vaping Use: Never used  Substance Use Topics  . Alcohol use: No  . Drug use: No    Home Medications Prior to Admission medications   Medication Sig Start Date End Date Taking? Authorizing Provider  acetaminophen (TYLENOL) 500 MG tablet Take 1,000 mg by mouth every 6 (six) hours as needed for mild pain.     [provider]  albuterol (PROVENTIL HFA) 108 (90 Base) MCG/ACT inhaler INHALE 2 PUFFS INTO THE LUNGS EVERY 6 (SIX) HOURS AS NEEDED. Patient taking differently: Inhale 2 puffs into the lungs every 6 (six) hours as needed for wheezing or shortness of  breath. INHALE 2 PUFFS INTO THE LUNGS EVERY 6 (SIX) HOURS AS NEEDED. 10/20/18   Chesley Mires, MD  atorvastatin (LIPITOR) 80 MG tablet Take 1 tablet (80 mg total) by mouth daily. 02/23/19   Jerline Pain, MD  budesonide-formoterol (SYMBICORT) 80-4.5 MCG/ACT inhaler Inhale 2 puffs into the lungs 2 (two) times daily. 05/07/17   Parrett, Fonnie Mu, NP  cetirizine (ZYRTEC) 10 MG tablet Take 10 mg by mouth daily as needed.     [provider]  clonazePAM (KLONOPIN) 0.5 MG tablet Take 0.5 mg by mouth at bedtime as needed for anxiety.  11/03/18   [provider]  esomeprazole (NEXIUM) 40 MG capsule Take 1 capsule 30 min before breakfast. Patient not taking: Reported on 07/05/2019 04/19/16   Esterwood, Amy S, PA-C  fish oil-omega-3 fatty acids 1000 MG capsule Take 1 g by mouth daily.     [provider]  fluticasone (FLONASE) 50 MCG/ACT nasal spray Place 2 sprays into both nostrils daily. 09/20/13   Chesley Mires, MD  indomethacin (INDOCIN) 25 MG capsule Take 1 capsule (25 mg total) by mouth daily as needed (headaches). 01/18/19   Jerline Pain, MD  metoprolol succinate (TOPROL-XL) 25 MG 24 hr tablet Take 1 tablet (25 mg total) by mouth daily. 01/18/19   Jerline Pain, MD  Multiple Vitamin (MULITIVITAMIN WITH MINERALS) TABS Take 1 tablet by mouth daily.     [provider]  ondansetron (ZOFRAN) 4 MG tablet Take 1 tablet (4 mg total) by mouth daily as needed for nausea or vomiting. 07/16/19 07/15/20  Drue Novel, PA  ranitidine (ZANTAC) 150 MG capsule Take 1 capsule (150 mg total) by mouth every evening. Patient not taking: Reported on 07/05/2019 04/19/16   Milus Banister, MD  SYNTHROID 88 MCG tablet Take 88 mcg by mouth daily.  10/15/18   [provider]    Allergies    Penicillins  Review of Systems   Review of Systems  Constitutional: Negative for chills and fever.  HENT: Negative for congestion and rhinorrhea.   Respiratory: Negative for cough and shortness of  breath.   Cardiovascular: Negative for chest pain and palpitations.  Gastrointestinal: Positive for abdominal pain, anorexia, constipation and nausea. Negative for diarrhea and vomiting.  Genitourinary: Negative for difficulty urinating and dysuria.  Musculoskeletal: Negative for arthralgias and back pain.  Skin: Negative for rash and wound.  Neurological: Negative for light-headedness and headaches.    Physical Exam Updated Vital Signs BP 140/81   Pulse 79   Temp 99.4 F (37.4 C) (Oral)   Resp 18   SpO2 100%   Physical Exam Vitals and nursing note reviewed. Exam conducted with a chaperone present.  Constitutional:      General: She is not in acute distress.    Appearance: Normal appearance.  HENT:     Head: Normocephalic and atraumatic.     Nose: No rhinorrhea.  Eyes:     General:        Right eye: No discharge.        Left eye: No discharge.     Conjunctiva/sclera: Conjunctivae normal.  Cardiovascular:     Rate and Rhythm: Normal rate. Rhythm irregular.  Pulmonary:     Effort: Pulmonary effort is normal. No respiratory distress.     Breath sounds: No stridor.  Abdominal:     General: Abdomen is flat. There is no distension.     Palpations: Abdomen is soft.     Tenderness: There is abdominal tenderness in the right upper quadrant and epigastric area. There is no guarding or rebound. Negative signs include Murphy's sign.  Musculoskeletal:        General: No tenderness or signs of injury.  Skin:    General: Skin is warm and dry.  Neurological:     General: No focal deficit present.     Mental Status: She is alert. Mental status is at baseline.     Motor: No weakness.  Psychiatric:        Mood and Affect: Mood normal.        Behavior: Behavior normal.     ED Results / Procedures / Treatments   Labs (all labs ordered are listed, but only abnormal results are displayed) Labs Reviewed  LIPASE, BLOOD - Abnormal; Notable for the following components:      Result  Value   Lipase 54 (*)    All other components within normal limits  COMPREHENSIVE METABOLIC PANEL - Abnormal; Notable for the following components:   Potassium 3.4 (*)    Glucose, Bld 123 (*)    Total Protein 8.2 (*)    AST 964 (*)    ALT 379 (*)    Alkaline Phosphatase 290 (*)    Total Bilirubin 2.3 (*)    All other components within normal limits  CBC - Abnormal; Notable for the following components:   RDW 16.4 (*)    All other components within normal limits  URINALYSIS, ROUTINE W REFLEX MICROSCOPIC  HEPATITIS PANEL, ACUTE    EKG None  Radiology US Abdomen Limited RUQ  Result Date: 12/03/2019 CLINICAL DATA:  Right upper quadrant EXAM: ULTRASOUND ABDOMEN LIMITED RIGHT UPPER QUADRANT COMPARISON:  None. FINDINGS: Gallbladder: Status post cholecystectomy Common bile duct: Diameter: 4.5 mm Liver: Increased echotexture seen throughout. No focal abnormality or biliary ductal dilatation. Portal vein is patent on color Doppler imaging with normal direction of blood flow towards the liver. Other: None. IMPRESSION: Hepatic steatosis. Status post cholecystectomy Electronically Signed   By: Prudencio Pair M.D.   On: 12/03/2019 22:43    Procedures Procedures (including critical care time)  Medications Ordered in ED Medications - No data to display  ED Course  I have reviewed the triage vital signs and the nursing notes.  Pertinent labs & imaging results that were available during my care of the patient were reviewed by me and considered in my medical decision making (see chart for details).    MDM Rules/Calculators/A&P                          69 year old female with right upper quadrant pain status post cholecystectomy, has elevated liver enzymes, has elevated  T bili, ultrasound is performed and interpreted by radiology showing hepatic steatosis.  Hepatitis panel sent.  I spoke with the on-call gastroenterologist for low our GI.  They recommend outpatient follow-up.  Patient is  comfortable and able to tolerate p.o.  Her pain is controlled.  No signs of biliary duct obstruction, she is having bowel movements though she is chronically constipated.  No other concerning infectious signs or symptoms at this time.  She is recommended outpatient follow-up and agrees to this plan.  Final Clinical Impression(s) / ED Diagnoses Final diagnoses:  Hepatitis  Elevated liver enzymes    Rx / DC Orders ED Discharge Orders    None       Breck Coons, MD 12/03/19 2356

## 2019-12-03 NOTE — Telephone Encounter (Signed)
Patient called states she is having a lot of abdominal pain and bloating please advise

## 2019-12-03 NOTE — Telephone Encounter (Signed)
The pt has new onset abd pain, and bloating.  She has not been seen in a year at our office.  She was in tears over the phone stating she is very uncomfortable. She has an appt with Amy on 8/12. I advised her that she should go to an urgent care of call her PCP for evaluation. The pt agrees to go to urgent care due to the abd pain and she will keep her appt as scheduled.

## 2019-12-03 NOTE — ED Triage Notes (Signed)
Per pt, states she was at [facility] for abdominal pain-states they told her to come to ED for labs and a CT scan

## 2019-12-03 NOTE — ED Provider Notes (Signed)
[name]    CSN: [id number] Arrival date & time: 12/03/19  1406      History   Chief Complaint Chief Complaint  Patient presents with  . Abdominal Pain  . Constipation    HPI Dominique Mooney is a 69 y.o. female with history of IBS, pancreatitis, hypertension, obesity, asthma, GERD presenting for generalized abdominal pain.  Patient states she has been dealing with this since end of April, though has been getting worse over the last month.  Patient states that she called her GI provider prior to coming here: Has an appointment on 8/12, though cannot wait.  Last bowel movement was last week: Denies encopresis, change in urination.  States "if only I could throw up I think I feel better ".  Denies nausea, fever, change in diet, lifestyle, medications.  States she is taken Dulcolax and magnesium citrate without relief.   Past Medical History:  Diagnosis Date  . Allergic rhinitis       . Arthritis   . Asthma       . Chest pressure    a. Normal Stress Echo 05/2010  . Complication of anesthesia   . GERD (gastroesophageal reflux disease)   . Hyperlipidemia   . Hypertension   . Hypothyroidism   . Irritable bowel syndrome   . Pancreatitis    after ERCP  . Pneumonia   . PONV (postoperative nausea and vomiting)     Patient Active Problem List   Diagnosis Date Noted  . Osteoarthritis of left knee 07/16/2019  . Coronary artery calcification seen on CT scan 06/26/2017  . CAD in native artery 06/26/2017  . Cough 11/26/2016  . Chest pain 11/26/2016  . Abnormal mammogram 10/19/2012  . Mastodynia 06/01/2012  . ESSENTIAL HYPERTENSION, BENIGN 05/28/2010  . Mild persistent asthma 08/02/2008  . Allergic rhinitis 01/05/2008  . GERD 10/09/2007  . CONSTIPATION 10/09/2007  . IRRITABLE BOWEL SYNDROME 10/09/2007  . HYPOTHYROIDISM 10/07/2007  . HYPERLIPIDEMIA 10/07/2007  . PANCREATITIS 10/07/2007    Past Surgical History:  Procedure Laterality Date  . BRAVO [place] STUDY   05/02/2011   Procedure: BRAVO [place] STUDY;  Surgeon: Owens Loffler, MD;  Location: [facility];  Service: Endoscopy;  Laterality: N/A;  48 hour wirless pH test (Bravo Test)  . CHOLECYSTECTOMY    . ESOPHAGOGASTRODUODENOSCOPY  05/02/2011   Procedure: ESOPHAGOGASTRODUODENOSCOPY (EGD);  Surgeon: Owens Loffler, MD;  Location: [facility];  Service: Endoscopy;  Laterality: N/A;  . HERNIA REPAIR    . LEFT HEART CATH AND CORONARY ANGIOGRAPHY N/A 06/26/2017   Procedure: LEFT HEART CATH AND CORONARY ANGIOGRAPHY;  Surgeon: Belva Crome, MD;  Location: [facility];  Service: Cardiovascular;  Laterality: N/A;  . MENISCUS REPAIR Left 03/18/2016   and repair of cracked bone  . SALPINGOOPHORECTOMY    . TOTAL ABDOMINAL HYSTERECTOMY    . TOTAL KNEE ARTHROPLASTY Left 07/16/2019   Procedure: TOTAL KNEE ARTHROPLASTY;  Surgeon: Sydnee Cabal, MD;  Location: [facility];  Service: Orthopedics;  Laterality: Left;    OB History   No obstetric history on file.      Home Medications    Prior to Admission medications   Medication Sig Start Date End Date Taking? Authorizing Provider  acetaminophen (TYLENOL) 500 MG tablet Take 1,000 mg by mouth every 6 (six) hours as needed for mild pain.     [provider]  albuterol (PROVENTIL HFA) 108 (90 Base) MCG/ACT inhaler INHALE 2 PUFFS INTO THE LUNGS EVERY 6 (SIX) HOURS AS NEEDED.  Patient taking differently: Inhale 2 puffs into the lungs every 6 (six) hours as needed for wheezing or shortness of breath. INHALE 2 PUFFS INTO THE LUNGS EVERY 6 (SIX) HOURS AS NEEDED. 10/20/18   Chesley Mires, MD  atorvastatin (LIPITOR) 80 MG tablet Take 1 tablet (80 mg total) by mouth daily. 02/23/19   Jerline Pain, MD  budesonide-formoterol (SYMBICORT) 80-4.5 MCG/ACT inhaler Inhale 2 puffs into the lungs 2 (two) times daily. 05/07/17   Parrett, Fonnie Mu, NP  cetirizine (ZYRTEC) 10 MG tablet Take 10 mg by mouth daily as needed.     [provider]  clonazePAM (KLONOPIN) 0.5  MG tablet Take 0.5 mg by mouth at bedtime as needed for anxiety.  11/03/18   [provider]  esomeprazole (NEXIUM) 40 MG capsule Take 1 capsule 30 min before breakfast. Patient not taking: Reported on 07/05/2019 04/19/16   Esterwood, Amy S, PA-C  fish oil-omega-3 fatty acids 1000 MG capsule Take 1 g by mouth daily.     [provider]  fluticasone (FLONASE) 50 MCG/ACT nasal spray Place 2 sprays into both nostrils daily. 09/20/13   Chesley Mires, MD  indomethacin (INDOCIN) 25 MG capsule Take 1 capsule (25 mg total) by mouth daily as needed (headaches). 01/18/19   Jerline Pain, MD  metoprolol succinate (TOPROL-XL) 25 MG 24 hr tablet Take 1 tablet (25 mg total) by mouth daily. 01/18/19   Jerline Pain, MD  Multiple Vitamin (MULITIVITAMIN WITH MINERALS) TABS Take 1 tablet by mouth daily.     [provider]  ondansetron (ZOFRAN) 4 MG tablet Take 1 tablet (4 mg total) by mouth daily as needed for nausea or vomiting. 07/16/19 07/15/20  Drue Novel, PA  ranitidine (ZANTAC) 150 MG capsule Take 1 capsule (150 mg total) by mouth every evening. Patient not taking: Reported on 07/05/2019 04/19/16   Milus Banister, MD  SYNTHROID 88 MCG tablet Take 88 mcg by mouth daily.  10/15/18   [provider]    Family History Family History  Problem Relation Age of Onset  . Stroke Mother   . Stroke Father   . Allergies Brother   . Allergies Sister   . Colon polyps Sister   . Heart attack Brother   . Heart attack Brother   . Allergies Son   . Colon cancer Neg Hx   . Stomach cancer Neg Hx   . Esophageal cancer Neg Hx   . Lung disease Neg Hx   . Cancer Neg Hx   . Rectal cancer Neg Hx     Social History Social History   Tobacco Use  . Smoking status: Never Smoker  . Smokeless tobacco: Never Used  Vaping Use  . Vaping Use: Never used  Substance Use Topics  . Alcohol use: No  . Drug use: No     Allergies   Penicillins   Review of Systems As per HPI   Physical  Exam Triage Vital Signs ED Triage Vitals [12/03/19 1419]  Enc Vitals Group     BP (!) 144/88     Pulse Rate 95     Resp 20     Temp 98.5 F (36.9 C)     Temp Source Oral     SpO2 98 %     Weight      Height      Head Circumference      Peak Flow      Pain Score      Pain Loc  Pain Edu?      Excl. in [place]?    No data found.  Updated Vital Signs BP (!) 144/88 (BP Location: Left Arm)   Pulse 95   Temp 98.5 F (36.9 C) (Oral)   Resp 20   SpO2 98%   Visual Acuity Right Eye Distance:   Left Eye Distance:   Bilateral Distance:    Right Eye Near:   Left Eye Near:    Bilateral Near:     Physical Exam Constitutional:      General: She is not in acute distress.    Appearance: She is well-developed. She is obese.  HENT:     Head: Normocephalic and atraumatic.  Eyes:     General: No scleral icterus.    Pupils: Pupils are equal, round, and reactive to light.  Cardiovascular:     Rate and Rhythm: Normal rate.  Pulmonary:     Effort: Pulmonary effort is normal.  Abdominal:     General: There is no abdominal bruit.     Palpations: There is no hepatomegaly or splenomegaly.     Tenderness: There is generalized abdominal tenderness. There is no guarding. Positive signs include Murphy's sign. Negative signs include Rovsing's sign and McBurney's sign.     Comments: Mild distention and firmness in RUQ  Skin:    Coloration: Skin is not jaundiced or pale.  Neurological:     Mental Status: She is alert and oriented to person, place, and time.      UC Treatments / Results  Labs (all labs ordered are listed, but only abnormal results are displayed) Labs Reviewed - No data to display  EKG   Radiology No results found.  Procedures Procedures (including critical care time)  Medications Ordered in UC Medications - No data to display  Initial Impression / Assessment and Plan / UC Course  I have reviewed the triage vital signs and the nursing notes.  Pertinent  labs & imaging results that were available during my care of the patient were reviewed by me and considered in my medical decision making (see chart for details).     Patient febrile, nontoxic in office today.  Patient is s/p laparoscopic hysterectomy, laparoscopic cholecystectomy.  Denies history of SBO, though H&P is concerning for this as she reports no BM x1 week s/p multiple laxatives.  Discussed utility of basic lab work, bowel rest: Patient electing to go to ER for further evaluation/management.  Electing to self transport and personal vehicle-discharged in stable condition. Final Clinical Impressions(s) / UC Diagnoses   Final diagnoses:  Right upper quadrant abdominal pain  Constipation, unspecified constipation type  Abdominal distension  Nausea without vomiting  Malaise  History of abdominal surgery   Discharge Instructions   None    ED Prescriptions    None     I have reviewed the PDMP during this encounter.   [place], [place], [place] 12/03/19 1553

## 2019-12-04 LAB — URINALYSIS, ROUTINE W REFLEX MICROSCOPIC
Bilirubin Urine: NEGATIVE
Glucose, UA: NEGATIVE mg/dL
Hgb urine dipstick: NEGATIVE
Ketones, ur: NEGATIVE mg/dL
Leukocytes,Ua: NEGATIVE
Nitrite: NEGATIVE
Protein, ur: 30 mg/dL — AB
Specific Gravity, Urine: 1.015 (ref 1.005–1.030)
pH: 7 (ref 5.0–8.0)

## 2019-12-04 LAB — HEPATITIS PANEL, ACUTE
HCV Ab: NONREACTIVE
Hep A IgM: NONREACTIVE
Hep B C IgM: NONREACTIVE
Hepatitis B Surface Ag: NONREACTIVE

## 2019-12-06 ENCOUNTER — Telehealth: Payer: Self-pay | Admitting: Gastroenterology

## 2019-12-06 ENCOUNTER — Other Ambulatory Visit (HOSPITAL_COMMUNITY): Payer: Self-pay | Admitting: Internal Medicine

## 2019-12-06 ENCOUNTER — Other Ambulatory Visit: Payer: Self-pay | Admitting: Internal Medicine

## 2019-12-06 ENCOUNTER — Ambulatory Visit (HOSPITAL_BASED_OUTPATIENT_CLINIC_OR_DEPARTMENT_OTHER): Payer: Medicare Other

## 2019-12-06 ENCOUNTER — Other Ambulatory Visit (HOSPITAL_COMMUNITY): Payer: Medicare Other

## 2019-12-06 DIAGNOSIS — R1011 Right upper quadrant pain: Secondary | ICD-10-CM

## 2019-12-06 DIAGNOSIS — R110 Nausea: Secondary | ICD-10-CM | POA: Diagnosis not present

## 2019-12-06 DIAGNOSIS — K831 Obstruction of bile duct: Secondary | ICD-10-CM

## 2019-12-06 DIAGNOSIS — K5901 Slow transit constipation: Secondary | ICD-10-CM | POA: Diagnosis not present

## 2019-12-06 NOTE — Telephone Encounter (Signed)
I spoke [place] RN who is asking to work the pt in sooner with Dr Ardis Hughs.  I looked while on the phone and no sooner appts are available.  After giving Dominique Mooney this information and advising her that the referring could call Dr Ardis Hughs to see if he would double book her sooner I did have a cancellation for 7/21 at 1030 am.

## 2019-12-06 NOTE — Telephone Encounter (Signed)
Dominique Mooney from [facility] Is requesting a call back from a nurse to try to work in the pt sooner.

## 2019-12-06 NOTE — Telephone Encounter (Signed)
The pt has been advised that no sooner appts are available.  She was told I will call her if we have any cancellations. She was also encouraged to call and check for appts at her convenience.  She has also been advised to follow the recommendations given to her at the ED.  The pt has been advised of the information and verbalized understanding.

## 2019-12-06 NOTE — Telephone Encounter (Signed)
Patient is calling, states that she was seen in the hospital this past weekend and that the on call doctor told her to call and make an appointment with Dr. Ardis Hughs. Dr. Ardis Hughs does not have any more available follow up appointments and she states was told to get a sooner appointment than the one se has (8/12)

## 2019-12-07 NOTE — Telephone Encounter (Signed)
The pt has been advised and will keep appt for tomorrow

## 2019-12-08 ENCOUNTER — Encounter: Payer: Self-pay | Admitting: Gastroenterology

## 2019-12-08 ENCOUNTER — Other Ambulatory Visit (INDEPENDENT_AMBULATORY_CARE_PROVIDER_SITE_OTHER): Payer: Medicare Other

## 2019-12-08 ENCOUNTER — Ambulatory Visit (INDEPENDENT_AMBULATORY_CARE_PROVIDER_SITE_OTHER): Payer: Medicare Other | Admitting: Gastroenterology

## 2019-12-08 VITALS — BP 128/64 | HR 70 | Ht 61.0 in | Wt 182.0 lb

## 2019-12-08 DIAGNOSIS — R109 Unspecified abdominal pain: Secondary | ICD-10-CM

## 2019-12-08 DIAGNOSIS — K5900 Constipation, unspecified: Secondary | ICD-10-CM

## 2019-12-08 DIAGNOSIS — R7989 Other specified abnormal findings of blood chemistry: Secondary | ICD-10-CM

## 2019-12-08 LAB — CBC
HCT: 36.8 % (ref 36.0–46.0)
Hemoglobin: 12.3 g/dL (ref 12.0–15.0)
MCHC: 33.5 g/dL (ref 30.0–36.0)
MCV: 88.4 fl (ref 78.0–100.0)
Platelets: 264 10*3/uL (ref 150.0–400.0)
RBC: 4.16 Mil/uL (ref 3.87–5.11)
RDW: 16.9 % — ABNORMAL HIGH (ref 11.5–15.5)
WBC: 6.7 10*3/uL (ref 4.0–10.5)

## 2019-12-08 LAB — COMPREHENSIVE METABOLIC PANEL
ALT: 305 U/L — ABNORMAL HIGH (ref 0–35)
AST: 656 U/L — ABNORMAL HIGH (ref 0–37)
Albumin: 3.5 g/dL (ref 3.5–5.2)
Alkaline Phosphatase: 296 U/L — ABNORMAL HIGH (ref 39–117)
BUN: 9 mg/dL (ref 6–23)
CO2: 27 mEq/L (ref 19–32)
Calcium: 9 mg/dL (ref 8.4–10.5)
Chloride: 104 mEq/L (ref 96–112)
Creatinine, Ser: 0.96 mg/dL (ref 0.40–1.20)
GFR: 70.17 mL/min (ref >60.00)
Glucose, Bld: 92 mg/dL (ref 70–99)
Potassium: 4 mEq/L (ref 3.5–5.1)
Sodium: 136 mEq/L (ref 135–145)
Total Bilirubin: 2.6 mg/dL — ABNORMAL HIGH (ref 0.2–1.2)
Total Protein: 7.9 g/dL (ref 6.0–8.3)

## 2019-12-08 LAB — TSH: TSH: 8.2 u[IU]/mL — ABNORMAL HIGH (ref 0.35–4.50)

## 2019-12-08 LAB — PROTIME-INR
INR: 1.1 ratio — ABNORMAL HIGH (ref 0.8–1.0)
Prothrombin Time: 12.4 s (ref 9.6–13.1)

## 2019-12-08 MED ORDER — DIAZEPAM 5 MG PO TABS: ORAL_TABLET | ORAL | 0 refills | Status: DC

## 2019-12-08 NOTE — Patient Instructions (Addendum)
If you are age 67 or older, your body mass index should be between 23-30. Your Body mass index is 34.39 kg/m. If this is out of the aforementioned range listed, please consider follow up with your Primary Care Provider.  If you are age 50 or younger, your body mass index should be between 19-25. Your Body mass index is 34.39 kg/m. If this is out of the aformentioned range listed, please consider follow up with your Primary Care Provider.   Your provider has requested that you go to the basement level for lab work before leaving today. Press "B" on the elevator. The lab is located at the first door on the left as you exit the elevator.  It is recommended  that you complete a bowel purge (to clean out your bowels). Please do the following:  Purchase a bottle of Miralax over the counter as well as a box of 5 mg dulcolax tablets.  Take 4 dulcolax tablets. Wait 1 hour.  You will then drink 6-8 capfuls of Miralax mixed in an adequate amount of water/juice/gatorade (you may choose which of these liquids to drink) over the next 2-3 hours.  You should expect results within 1 to 6 hours after completing the bowel purge.  You have been scheduled for an MRI at [facility] on 12/15/19. Your appointment time is 7:00am. Please arrive 15 minutes prior to your appointment time for registration purposes. Please make certain not to have anything to eat or drink 4 hours prior to your test. In addition, if you have any metal in your body, have a pacemaker or defibrillator, please be sure to let your ordering physician know. This test typically takes 45 minutes to 1 hour to complete. Should you need to reschedule, please call [phone] to do so.  Due to recent changes in healthcare laws, you may see the results of your imaging and laboratory studies on MyChart before your provider has had a chance to review them.  We understand that in some cases there may be results that are confusing or concerning to you. Not all  laboratory results come back in the same time frame and the provider may be waiting for multiple results in order to interpret others.  Please give [place] 48 hours in order for your provider to thoroughly review all the results before contacting the office for clarification of your results.   Please discontinue Lipitor for the time being.   Thank you for entrusting me with your care and choosing [facility].  Dr Ardis Hughs

## 2019-12-08 NOTE — Addendum Note (Signed)
Addended by: Stevan Born on: 12/08/2019 11:28 AM   Modules accepted: Orders

## 2019-12-08 NOTE — Progress Notes (Signed)
Review of gastrointestinal problems:  1. Dysphasia, nausea 2009.EGD may 2009 was normal. Symptoms thought to be GERD related. Symptoms improved with addition of H2 blocker (March 2011). Barium esophagram 2011 was normal.   EGD August 2020 for odynophagia showed mild nonspecific gastritis, pathology showed no H. pylori. 2. routine risk for colon cancer,colonoscopy may 2009: hemorrhoids only. Next colonoscopy may 2019; Intermittent IBS-like discomforts that are helped with antispasmodics usually. Repeated colonoscopy 07/2013 for some bowel habit changes, it was completely normal. Recommended recall colonoscopy for cancer screening 07/2023.  Repeat colonoscopy August 2020 due to change in bowel habits showed left-sided diverticulosis, a single subcentimeter hyperplastic polyp was removed. 3. post ERCP pancreatitis, 2000  4. laparoscopic cholecystectomy,2000  5. GERD: EGD 12/12 with [employer] pH test 48 hours, done 05/02/11 while still on BID PPI (omeprazole) and bedtime H2 blocker was + for continued pathologic acid exposure. 2/13 felt much better on bid nexium than other PPI and nightly H2 blocker   HPI: This is a very pleasant 69 year old woman whom I last saw about a year ago.  She was in the emergency room last week with significant abdominal pains.  An abdominal ultrasound was performed and it showed hepatic steatosis only.  Her bile duct was 4.5 mm.  Status post cholecystectomy.  Her labs were significantly abnormal however with AST 964, ALT 379, alk phos 290, total bilirubin 2.3.  Her lipase was slightly elevated at 54.  An acute viral hepatitis panel was negative.  She tells me she has had a lot of problems with constipation since knee surgery 3 or 4 months ago.  She has tried 1 dose daily of MiraLAX, she has tried prunes.  None of this seems to help.  She goes about once every 5 days with significant amount of pushing and straining only.  She does not see blood.  Something change in the  past week or 2.  She started having more pain in the right upper quadrant.  She thought this was constipation.  She also notes darkening of her urine.  Her weight has been overall stable.  She has had no nausea or vomiting but has a lot of gas and belching.   ROS: complete GI ROS as described in HPI, all other review negative.  Constitutional:  No unintentional weight loss   Past Medical History:  Diagnosis Date  . Allergic rhinitis       . Arthritis   . Asthma       . Chest pressure    a. Normal Stress Echo 05/2010  . Complication of anesthesia   . GERD (gastroesophageal reflux disease)   . Hyperlipidemia   . Hypertension   . Hypothyroidism   . Irritable bowel syndrome   . Pancreatitis    after ERCP  . Pneumonia   . PONV (postoperative nausea and vomiting)     Past Surgical History:  Procedure Laterality Date  . BRAVO [place] STUDY  05/02/2011   Procedure: BRAVO [place] STUDY;  Surgeon: Owens Loffler, MD;  Location: [facility];  Service: Endoscopy;  Laterality: N/A;  48 hour wirless pH test (Bravo Test)  . CHOLECYSTECTOMY    . ESOPHAGOGASTRODUODENOSCOPY  05/02/2011   Procedure: ESOPHAGOGASTRODUODENOSCOPY (EGD);  Surgeon: Owens Loffler, MD;  Location: [facility];  Service: Endoscopy;  Laterality: N/A;  . HERNIA REPAIR    . LEFT HEART CATH AND CORONARY ANGIOGRAPHY N/A 06/26/2017   Procedure: LEFT HEART CATH AND CORONARY ANGIOGRAPHY;  Surgeon: Belva Crome, MD;  Location: [facility];  Service: Cardiovascular;  Laterality: N/A;  . MENISCUS REPAIR Left 03/18/2016   and repair of cracked bone  . SALPINGOOPHORECTOMY    . TOTAL ABDOMINAL HYSTERECTOMY    . TOTAL KNEE ARTHROPLASTY Left 07/16/2019   Procedure: TOTAL KNEE ARTHROPLASTY;  Surgeon: Sydnee Cabal, MD;  Location: [facility];  Service: Orthopedics;  Laterality: Left;    Current Outpatient Medications  Medication Sig Dispense Refill  . acetaminophen (TYLENOL) 500 MG tablet Take 1,000 mg by mouth every 6 (six) hours as  needed for mild pain.     Marland Kitchen albuterol (PROVENTIL HFA) 108 (90 Base) MCG/ACT inhaler INHALE 2 PUFFS INTO THE LUNGS EVERY 6 (SIX) HOURS AS NEEDED. (Patient taking differently: Inhale 2 puffs into the lungs every 6 (six) hours as needed for wheezing or shortness of breath. INHALE 2 PUFFS INTO THE LUNGS EVERY 6 (SIX) HOURS AS NEEDED.) 1 Inhaler 3  . atorvastatin (LIPITOR) 80 MG tablet Take 1 tablet (80 mg total) by mouth daily. 90 tablet 3  . budesonide-formoterol (SYMBICORT) 80-4.5 MCG/ACT inhaler Inhale 2 puffs into the lungs 2 (two) times daily. 1 Inhaler 3  . cetirizine (ZYRTEC) 10 MG tablet Take 10 mg by mouth daily as needed.     Marland Kitchen esomeprazole (NEXIUM) 40 MG capsule Take 1 capsule 30 min before breakfast. 30 capsule 11  . fish oil-omega-3 fatty acids 1000 MG capsule Take 1 g by mouth daily.     . fluticasone (FLONASE) 50 MCG/ACT nasal spray Place 2 sprays into both nostrils daily. 16 g 2  . indomethacin (INDOCIN) 25 MG capsule Take 1 capsule (25 mg total) by mouth daily as needed (headaches). 90 capsule 3  . metoprolol succinate (TOPROL-XL) 25 MG 24 hr tablet Take 1 tablet (25 mg total) by mouth daily. 90 tablet 3  . Multiple Vitamin (MULITIVITAMIN WITH MINERALS) TABS Take 1 tablet by mouth daily.     . ranitidine (ZANTAC) 150 MG capsule Take 1 capsule (150 mg total) by mouth every evening. 30 capsule 11  . SYNTHROID 88 MCG tablet Take 88 mcg by mouth daily.      No current facility-administered medications for this visit.    Allergies as of 12/08/2019 - Review Complete 12/08/2019  Allergen Reaction Noted  . Penicillins Rash 11/24/2014    Family History  Problem Relation Age of Onset  . Stroke Mother   . Stroke Father   . Allergies Brother   . Allergies Sister   . Colon polyps Sister   . Heart attack Brother   . Heart attack Brother   . Allergies Son   . Colon cancer Neg Hx   . Stomach cancer Neg Hx   . Esophageal cancer Neg Hx   . Lung disease Neg Hx   . Cancer Neg Hx   .  Rectal cancer Neg Hx     Social History   Socioeconomic History  . Marital status: Married    Spouse name: Not on file  . Number of children: 2  . Years of education: Not on file  . Highest education level: Not on file  Occupational History  . Occupation: retired    Fish farm manager: [employer]  Tobacco Use  . Smoking status: Never Smoker  . Smokeless tobacco: Never Used  Vaping Use  . Vaping Use: Never used  Substance and Sexual Activity  . Alcohol use: No  . Drug use: No  . Sexual activity: Not on file  Other Topics Concern  . 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   Currently retired as of 07/2012      [facility] (11/26/16):   Originally from [place] [facility] [place] Has always lived in [place]. She retired from [employer]. 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 No pets currently. No bird exposure. No mold or hot tub exposure. Does have carpet in her bedroom. No feather bedding. She has blinds & draperies. No indoor plants. Enjoys helping out in her church and with nonprofit work. Enjoys going to the beach.    Social Determinants of Health   Financial Resource Strain:   . Difficulty of Paying Living Expenses:   Food Insecurity:   . Worried About Charity fundraiser in the Last Year:   . Arboriculturist in the Last Year:   Transportation Needs:   . Film/video editor (Medical):   Marland Kitchen Lack of Transportation (Non-Medical):   Physical Activity:   . Days of Exercise per Week:   . Minutes of Exercise per Session:   Stress:   . Feeling of Stress :   Social Connections:   . Frequency of Communication with Friends and Family:   . Frequency of Social Gatherings with Friends and Family:   . Attends Religious Services:   . Active Member of Clubs or Organizations:   . Attends Archivist Meetings:   Marland Kitchen Marital Status:   Intimate Partner Violence:   . Fear of Current or Ex-Partner:   .  Emotionally Abused:   Marland Kitchen Physically Abused:   . Sexually Abused:      Physical Exam: BP 128/64   Pulse 70   Ht _0  (1.549 m)   Wt 182 lb (82.6 kg)   BMI 34.39 kg/m  Constitutional: generally well-appearing Psychiatric: alert and oriented x3 Abdomen: soft, nontender, nondistended, no obvious ascites, no peritoneal signs, normal bowel sounds No peripheral edema noted in lower extremities  Assessment and plan: 69 y.o. female with constipation, acute hepatitis  First I explained to her that her constipation and her liver test abnormalities are unrelated.  I think a lot of her symptoms most recently had been from her acute hepatitis rather than constipation.  She is seriously bothered by constipation however.  She has a BM about every 5 days with only a lot of pushing and straining.  I recommended a MiraLAX purge and then after that she will increase her daily MiraLAX dose to 2 doses.  More importantly is her significant hepatitis.  Viral etiology is ruled out.  She does not take Tylenol.  She does not take herbs or over-the-counter vitamins.  She does not forage for mushrooms.  She has been on the same dose of Lipitor for about a year.  LFTs 6 months ago were completely normal.  Perhaps she has a retained bile duct stone.  Perhaps this is another phenomenon.  She is going to get a battery of blood test today including AMA, ANA, anti-smooth muscle antibody, repeat CBC, repeat complete metabolic profile, repeat coags.  We will also arrange MRI with MRCP, she has claustrophobia and so I am giving her 5 mg Valium pills to take shortly before the MRI.  She will hold her Lipitor for now.  Please see the "Patient Instructions" section for addition details about the plan.  Owens Loffler, MD [facility] 12/08/2019, 10:48 AM   Total time on date of encounter was 40 minutes (this included time  spent preparing to see the patient reviewing records; obtaining and/or reviewing  separately obtained history; performing a medically appropriate exam and/or evaluation; counseling and educating the patient and family if present; ordering medications, tests or procedures if applicable; and documenting clinical information in the health record).

## 2019-12-12 LAB — ANA: Anti Nuclear Antibody (ANA): POSITIVE — AB

## 2019-12-12 LAB — ANTI-SMOOTH MUSCLE ANTIBODY, IGG: Actin (Smooth Muscle) Antibody (IGG): 53 U — ABNORMAL HIGH (ref <20)

## 2019-12-12 LAB — MITOCHONDRIAL ANTIBODIES: Mitochondrial M2 Ab, IgG: <=20 U

## 2019-12-12 LAB — ANTI-NUCLEAR AB-TITER (ANA TITER)
ANA TITER: 1:80 {titer} — ABNORMAL HIGH
ANA Titer 1: 1:80 {titer} — ABNORMAL HIGH

## 2019-12-14 ENCOUNTER — Ambulatory Visit (HOSPITAL_COMMUNITY)
Admission: RE | Admit: 2019-12-14 | Discharge: 2019-12-14 | Disposition: A | Discharge status: Home / Self Care | Payer: Medicare Other | Source: Ambulatory Visit | Attending: Gastroenterology | Admitting: Gastroenterology

## 2019-12-14 ENCOUNTER — Other Ambulatory Visit: Payer: Self-pay

## 2019-12-14 ENCOUNTER — Other Ambulatory Visit: Payer: Self-pay | Admitting: Gastroenterology

## 2019-12-14 DIAGNOSIS — R7989 Other specified abnormal findings of blood chemistry: Secondary | ICD-10-CM

## 2019-12-14 DIAGNOSIS — K7689 Other specified diseases of liver: Secondary | ICD-10-CM | POA: Diagnosis not present

## 2019-12-14 DIAGNOSIS — K838 Other specified diseases of biliary tract: Secondary | ICD-10-CM | POA: Diagnosis not present

## 2019-12-14 DIAGNOSIS — K760 Fatty (change of) liver, not elsewhere classified: Secondary | ICD-10-CM | POA: Diagnosis not present

## 2019-12-14 DIAGNOSIS — R935 Abnormal findings on diagnostic imaging of other abdominal regions, including retroperitoneum: Secondary | ICD-10-CM | POA: Diagnosis not present

## 2019-12-14 MED ORDER — GADOBUTROL 1 MMOL/ML IV SOLN
8.0000 mL | Freq: Once | INTRAVENOUS | Status: AC | PRN
  Administered 2019-12-14: 8 mL via INTRAVENOUS

## 2019-12-15 ENCOUNTER — Telehealth: Payer: Self-pay | Admitting: Gastroenterology

## 2019-12-15 ENCOUNTER — Other Ambulatory Visit: Payer: Medicare Other

## 2019-12-15 ENCOUNTER — Ambulatory Visit (HOSPITAL_COMMUNITY): Admission: RE | Admit: 2019-12-15 | Payer: Medicare Other | Source: Ambulatory Visit

## 2019-12-15 DIAGNOSIS — R7989 Other specified abnormal findings of blood chemistry: Secondary | ICD-10-CM | POA: Diagnosis not present

## 2019-12-15 NOTE — Telephone Encounter (Signed)
Radiology report

## 2019-12-15 NOTE — Telephone Encounter (Signed)
Dr Ardis Hughs we received a call report on the pt MRI please advise

## 2019-12-16 ENCOUNTER — Other Ambulatory Visit: Payer: Self-pay

## 2019-12-16 DIAGNOSIS — R7989 Other specified abnormal findings of blood chemistry: Secondary | ICD-10-CM

## 2019-12-20 LAB — PROTEIN ELECTROPHORESIS, SERUM
Albumin ELP: 3.3 g/dL — ABNORMAL LOW (ref 3.8–4.8)
Alpha 1: 0.3 g/dL (ref 0.2–0.3)
Alpha 2: 0.7 g/dL (ref 0.5–0.9)
Beta 2: 0.7 g/dL — ABNORMAL HIGH (ref 0.2–0.5)
Beta Globulin: 0.4 g/dL (ref 0.4–0.6)
Gamma Globulin: 1.8 g/dL — ABNORMAL HIGH (ref 0.8–1.7)
Total Protein: 7.1 g/dL (ref 6.1–8.1)

## 2019-12-22 ENCOUNTER — Ambulatory Visit (HOSPITAL_COMMUNITY): Payer: Medicare Other

## 2019-12-30 ENCOUNTER — Ambulatory Visit: Payer: Medicare Other | Admitting: Physician Assistant

## 2019-12-30 ENCOUNTER — Other Ambulatory Visit: Payer: Self-pay | Admitting: Student

## 2019-12-30 ENCOUNTER — Other Ambulatory Visit: Payer: Self-pay | Admitting: Radiology

## 2019-12-31 ENCOUNTER — Ambulatory Visit (HOSPITAL_COMMUNITY)
Admission: RE | Admit: 2019-12-31 | Discharge: 2019-12-31 | Disposition: A | Discharge status: Home / Self Care | Payer: Medicare Other | Source: Ambulatory Visit | Attending: Gastroenterology | Admitting: Gastroenterology

## 2019-12-31 ENCOUNTER — Other Ambulatory Visit: Payer: Self-pay

## 2019-12-31 DIAGNOSIS — R748 Abnormal levels of other serum enzymes: Secondary | ICD-10-CM | POA: Diagnosis not present

## 2019-12-31 DIAGNOSIS — K7689 Other specified diseases of liver: Secondary | ICD-10-CM | POA: Diagnosis not present

## 2019-12-31 DIAGNOSIS — R945 Abnormal results of liver function studies: Secondary | ICD-10-CM | POA: Diagnosis not present

## 2019-12-31 DIAGNOSIS — R760 Raised antibody titer: Secondary | ICD-10-CM | POA: Diagnosis not present

## 2019-12-31 DIAGNOSIS — K760 Fatty (change of) liver, not elsewhere classified: Secondary | ICD-10-CM | POA: Diagnosis not present

## 2019-12-31 DIAGNOSIS — R7989 Other specified abnormal findings of blood chemistry: Secondary | ICD-10-CM

## 2019-12-31 LAB — COMPREHENSIVE METABOLIC PANEL
ALT: 31 U/L (ref 0–44)
AST: 76 U/L — ABNORMAL HIGH (ref 15–41)
Albumin: 3.5 g/dL (ref 3.5–5.0)
Alkaline Phosphatase: 157 U/L — ABNORMAL HIGH (ref 38–126)
Anion gap: 10 (ref 5–15)
BUN: 12 mg/dL (ref 8–23)
CO2: 24 mmol/L (ref 22–32)
Calcium: 9.4 mg/dL (ref 8.9–10.3)
Chloride: 108 mmol/L (ref 98–111)
Creatinine, Ser: 0.82 mg/dL (ref 0.44–1.00)
GFR calc Af Amer: >60 mL/min (ref >60)
GFR calc non Af Amer: >60 mL/min (ref >60)
Glucose, Bld: 95 mg/dL (ref 70–99)
Potassium: 4.1 mmol/L (ref 3.5–5.1)
Sodium: 142 mmol/L (ref 135–145)
Total Bilirubin: 1.3 mg/dL — ABNORMAL HIGH (ref 0.3–1.2)
Total Protein: 7.8 g/dL (ref 6.5–8.1)

## 2019-12-31 LAB — CBC
HCT: 37.7 % (ref 36.0–46.0)
Hemoglobin: 12.5 g/dL (ref 12.0–15.0)
MCH: 31.3 pg (ref 26.0–34.0)
MCHC: 33.2 g/dL (ref 30.0–36.0)
MCV: 94.3 fL (ref 80.0–100.0)
Platelets: 224 10*3/uL (ref 150–400)
RBC: 4 MIL/uL (ref 3.87–5.11)
RDW: 16.7 % — ABNORMAL HIGH (ref 11.5–15.5)
WBC: 6.5 10*3/uL (ref 4.0–10.5)
nRBC: 0 % (ref 0.0–0.2)

## 2019-12-31 LAB — PROTIME-INR
INR: 1 (ref 0.8–1.2)
Prothrombin Time: 12.8 seconds (ref 11.4–15.2)

## 2019-12-31 MED ORDER — FENTANYL CITRATE (PF) 100 MCG/2ML IJ SOLN
INTRAMUSCULAR | Status: AC | PRN
  Administered 2019-12-31 (×2): 50 ug via INTRAVENOUS

## 2019-12-31 MED ORDER — GELATIN ABSORBABLE 12-7 MM EX MISC
CUTANEOUS | Status: AC
  Filled 2019-12-31: qty 1

## 2019-12-31 MED ORDER — FENTANYL CITRATE (PF) 100 MCG/2ML IJ SOLN
INTRAMUSCULAR | Status: AC
  Filled 2019-12-31: qty 2

## 2019-12-31 MED ORDER — OXYCODONE HCL 5 MG PO TABS: 5.0000 mg | ORAL_TABLET | ORAL | Status: DC | PRN

## 2019-12-31 MED ORDER — LIDOCAINE HCL 1 % IJ SOLN
INTRAMUSCULAR | Status: AC
  Filled 2019-12-31: qty 20

## 2019-12-31 MED ORDER — MIDAZOLAM HCL 2 MG/2ML IJ SOLN
INTRAMUSCULAR | Status: AC | PRN
  Administered 2019-12-31 (×2): 1 mg via INTRAVENOUS

## 2019-12-31 MED ORDER — SODIUM CHLORIDE 0.9 % IV SOLN: INTRAVENOUS | Status: DC

## 2019-12-31 MED ORDER — MIDAZOLAM HCL 2 MG/2ML IJ SOLN
INTRAMUSCULAR | Status: AC
  Filled 2019-12-31: qty 2

## 2019-12-31 NOTE — Discharge Instructions (Signed)
Urgent needs - IR on call MD [phone]  Wound - May remove dressing and shower in 24.  Keep site clean and dry.  Replace with bandaid. Do not submerge in tub until site healing.  Moderate Conscious Sedation, Adult, Care After These instructions provide you with information about caring for yourself after your procedure. Your health care provider may also give you more specific instructions. Your treatment has been planned according to current medical practices, but problems sometimes occur. Call your health care provider if you have any problems or questions after your procedure. What can I expect after the procedure? After your procedure, it is common:  To feel sleepy for several hours.  To feel clumsy and have poor balance for several hours.  To have poor judgment for several hours.  To vomit if you eat too soon. Follow these instructions at home: For at least 24 hours after the procedure:  Do not: ? Participate in activities where you could fall or become injured. ? Drive. ? Use heavy machinery. ? Drink alcohol. ? Take sleeping pills or medicines that cause drowsiness. ? Make important decisions or sign legal documents. ? Take care of children on your own.  Rest. Eating and drinking  Follow the diet recommended by your health care provider.  If you vomit: ? Drink water, juice, or soup when you can drink without vomiting. ? Make sure you have little or no nausea before eating solid foods. General instructions  Have a responsible adult stay with you until you are awake and alert.  Take over-the-counter and prescription medicines only as told by your health care provider.  If you smoke, do not smoke without supervision.  Keep all follow-up visits as told by your health care provider. This is important. Contact a health care provider if:  You keep feeling nauseous or you keep vomiting.  You feel light-headed.  You develop a rash.  You have a fever. Get help  right away if:  You have trouble breathing. This information is not intended to replace advice given to you by your health care provider. Make sure you discuss any questions you have with your health care provider. Document Revised: 04/18/2017 Document Reviewed: 08/26/2015 Elsevier Patient Education  [employer].   Liver Biopsy, Care After These instructions give you information about how to care for yourself after your procedure. Your health care provider may also give you more specific instructions. If you have problems or questions, contact your health care provider. What can I expect after the procedure? After your procedure, it is common to have:  Pain and soreness in the area where the biopsy was done.  Bruising around the area where the biopsy was done.  Sleepiness and fatigue for 1-2 days. Follow these instructions at home: Medicines  Take over-the-counter and prescription medicines only as told by your health care provider.  If you were prescribed an antibiotic medicine, take it as told by your health care provider. Do not stop taking the antibiotic even if you start to feel better.  Do not take medicines such as aspirin and ibuprofen unless your health care provider tells you to take them. These medicines thin your blood and can increase the risk of bleeding.  If you are taking prescription pain medicine, take actions to prevent or treat constipation. Your health care provider may recommend that you: ? Drink enough fluid to keep your urine pale yellow. ? Eat foods that are high in fiber, such as fresh fruits and vegetables, whole  grains, and beans. ? Limit foods that are high in fat and processed sugars, such as fried or sweet foods. ? Take an over-the-counter or prescription medicine for constipation. Incision care  Follow instructions from your health care provider about how to take care of your incision. Make sure you: ? Wash your hands with soap and water  before you change your bandage (dressing). If soap and water are not available, use hand sanitizer. ? Change your dressing as told by your health care provider. ? Leave stitches (sutures), skin glue, or adhesive strips in place. These skin closures may need to stay in place for 2 weeks or longer. If adhesive strip edges start to loosen and curl up, you may trim the loose edges. Do not remove adhesive strips completely unless your health care provider tells you to do that.  Check your incision area every day for signs of infection. Check for: ? Redness, swelling, or pain. ? Fluid or blood. ? Warmth. ? Pus or a bad smell.  Do not take baths, swim, or use a hot tub until your health care provider says it is okay to do so. Activity  Rest at home for 1-2 days, or as directed by your health care provider. ? Avoid sitting for a long time without moving. Get up to take short walks every 1-2 hours. This is important to improve blood flow and breathing. Ask for help if you feel weak or unsteady.  Return to your normal activities as told by your health care provider. Ask your health care provider what activities are safe for you.  Do not drive or use heavy machinery while taking prescription pain medicine.  Do not lift anything that is heavier than 10 lb (4.5 kg), or the limit that your health care provider tells you, until he or she says that it is safe.  Do not play contact sports for 2 weeks after the procedure. General instructions  Do not drink alcohol in the first week after the procedure.  Have someone stay with you for at least 24 hours after the procedure.  It is your responsibility to obtain your test results. Ask your health care provider, or the department that is doing the test: ? When will my results be ready? ? How will I get my results? ? What are my treatment options? ? What other tests do I need? ? What are my next steps?  Keep all follow-up visits as told by your health  care provider. This is important. Contact a health care provider if:  You have increased bleeding from an incision, resulting in more than a small spot of blood.  You have redness, swelling, or increasing pain in any incisions.  You notice a discharge or a bad smell coming from any of your incisions.  You have a fever or chills. Get help right away if:  You develop swelling, bloating, or pain in your abdomen.  You become dizzy or faint.  You develop a rash.  You have nausea or you vomit.  You faint, or you have shortness of breath or difficulty breathing.  You develop chest pain.  You have problems with your speech or vision.  You have trouble with your balance or moving your arms or legs. Summary  After the liver biopsy, it is common to have pain, soreness, and bruising in the area, as well as sleepiness and fatigue.  Take over-the-counter and prescription medicines only as told by your health care provider.  Follow instructions from your  health care provider about how to care for your incision. Check the incision area daily for signs of infection. This information is not intended to replace advice given to you by your health care provider. Make sure you discuss any questions you have with your health care provider. Document Revised: 06/29/2018 Document Reviewed: 05/16/2017 Elsevier Patient Education  2020 [employer].

## 2019-12-31 NOTE — Consult Note (Addendum)
Chief Complaint: Patient was seen in consultation today for image guided random core liver biopsy  Referring Physician(s): Owens Loffler P  Supervising Physician: Markus Daft  Patient Status: [facility] - Out-pt  History of Present Illness: Dominique Mooney is a 69 y.o. female with past medical history of GERD, hyperlipidemia, hypertension, prior cholecystectomy, hypothyroidism, IBS, and now with findings of elevated liver function tests, positive ANA, positive ASMA and mild hepatic steatosis by imaging.  She presents today for image guided random core liver biopsy for further evaluation/rule out autoimmune hepatitis/PSC.  Past Medical History:  Diagnosis Date  . Allergic rhinitis       . Arthritis   . Asthma       . Chest pressure    a. Normal Stress Echo 05/2010  . Complication of anesthesia   . GERD (gastroesophageal reflux disease)   . Hyperlipidemia   . Hypertension   . Hypothyroidism   . Irritable bowel syndrome   . Pancreatitis    after ERCP  . Pneumonia   . PONV (postoperative nausea and vomiting)     Past Surgical History:  Procedure Laterality Date  . BRAVO [place] STUDY  05/02/2011   Procedure: BRAVO [place] STUDY;  Surgeon: Owens Loffler, MD;  Location: [facility] ENDOSCOPY;  Service: Endoscopy;  Laterality: N/A;  48 hour wirless pH test (Bravo Test)  . CHOLECYSTECTOMY    . ESOPHAGOGASTRODUODENOSCOPY  05/02/2011   Procedure: ESOPHAGOGASTRODUODENOSCOPY (EGD);  Surgeon: Owens Loffler, MD;  Location: [facility];  Service: Endoscopy;  Laterality: N/A;  . HERNIA REPAIR    . LEFT HEART CATH AND CORONARY ANGIOGRAPHY N/A 06/26/2017   Procedure: LEFT HEART CATH AND CORONARY ANGIOGRAPHY;  Surgeon: Belva Crome, MD;  Location: [facility];  Service: Cardiovascular;  Laterality: N/A;  . MENISCUS REPAIR Left 03/18/2016   and repair of cracked bone  . SALPINGOOPHORECTOMY    . TOTAL ABDOMINAL HYSTERECTOMY    . TOTAL KNEE ARTHROPLASTY Left 07/16/2019   Procedure: TOTAL KNEE  ARTHROPLASTY;  Surgeon: Sydnee Cabal, MD;  Location: [facility] ORS;  Service: Orthopedics;  Laterality: Left;    Allergies: Penicillins  Medications: Prior to Admission medications   Medication Sig Start Date End Date Taking? Authorizing Provider  acetaminophen (TYLENOL) 500 MG tablet Take 1,000 mg by mouth every 6 (six) hours as needed for mild pain.     [provider]  albuterol (PROVENTIL HFA) 108 (90 Base) MCG/ACT inhaler INHALE 2 PUFFS INTO THE LUNGS EVERY 6 (SIX) HOURS AS NEEDED. Patient taking differently: Inhale 2 puffs into the lungs every 6 (six) hours as needed for wheezing or shortness of breath. INHALE 2 PUFFS INTO THE LUNGS EVERY 6 (SIX) HOURS AS NEEDED. 10/20/18   Chesley Mires, MD  budesonide-formoterol (SYMBICORT) 80-4.5 MCG/ACT inhaler Inhale 2 puffs into the lungs 2 (two) times daily. 05/07/17   Parrett, Fonnie Mu, NP  cetirizine (ZYRTEC) 10 MG tablet Take 10 mg by mouth daily as needed.     [provider]  diazepam (VALIUM) 5 MG tablet Take 1 pill prior to MRI and repeat if needed 12/08/19   Milus Banister, MD  esomeprazole (NEXIUM) 40 MG capsule Take 1 capsule 30 min before breakfast. 04/19/16   Esterwood, Amy S, PA-C  fish oil-omega-3 fatty acids 1000 MG capsule Take 1 g by mouth daily.     [provider]  fluticasone (FLONASE) 50 MCG/ACT nasal spray Place 2 sprays into both nostrils daily. 09/20/13   Chesley Mires, MD  indomethacin (INDOCIN) 25 MG capsule  Take 1 capsule (25 mg total) by mouth daily as needed (headaches). 01/18/19   Jerline Pain, MD  metoprolol succinate (TOPROL-XL) 25 MG 24 hr tablet Take 1 tablet (25 mg total) by mouth daily. 01/18/19   Jerline Pain, MD  Multiple Vitamin (MULITIVITAMIN WITH MINERALS) TABS Take 1 tablet by mouth daily.     [provider]  ranitidine (ZANTAC) 150 MG capsule Take 1 capsule (150 mg total) by mouth every evening. 04/19/16   Milus Banister, MD  SYNTHROID 88 MCG tablet Take 88 mcg by mouth daily.   10/15/18   [provider]     Family History  Problem Relation Age of Onset  . Stroke Mother   . Stroke Father   . Allergies Brother   . Allergies Sister   . Colon polyps Sister   . Heart attack Brother   . Heart attack Brother   . Allergies Son   . Colon cancer Neg Hx   . Stomach cancer Neg Hx   . Esophageal cancer Neg Hx   . Lung disease Neg Hx   . Cancer Neg Hx   . Rectal cancer Neg Hx     Social History   Socioeconomic History  . Marital status: Married    Spouse name: Not on file  . Number of children: 2  . Years of education: Not on file  . Highest education level: Not on file  Occupational History  . Occupation: retired    Fish farm manager:  [employer]  Tobacco Use  . Smoking status: Never Smoker  . Smokeless tobacco: Never Used  Vaping Use  . Vaping Use: Never used  Substance and Sexual Activity  . Alcohol use: No  . Drug use: No  . Sexual activity: Not on file  Other Topics Concern  . 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   Currently retired as of 07/2012      [facility] (11/26/16):   Originally from [place]. Has always lived in [place]. She retired from [employer]. 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 No pets currently. No bird exposure. No mold or hot tub exposure. Does have carpet in her bedroom. No feather bedding. She has blinds & draperies. No indoor plants. Enjoys helping out in her church and with nonprofit work. Enjoys going to the beach.    Social Determinants of Health   Financial Resource Strain:   . Difficulty of Paying Living Expenses:   Food Insecurity:   . Worried About Charity fundraiser in the Last Year:   . Arboriculturist in the Last Year:   Transportation Needs:   . Film/video editor (Medical):   Marland Kitchen Lack of Transportation (Non-Medical):   Physical Activity:   . Days of Exercise per Week:   . Minutes of Exercise per  Session:   Stress:   . Feeling of Stress :   Social Connections:   . Frequency of Communication with Friends and Family:   . Frequency of Social Gatherings with Friends and Family:   . Attends Religious Services:   . Active Member of Clubs or Organizations:   . Attends Archivist Meetings:   Marland Kitchen Marital Status:       Review of Systems:  denies fever, HA, chest pain,dyspnea,  cough, abdominal/back pain, nausea, vomiting or bleeding; she has had some constipation  Vital Signs: BP Marland Kitchen)  131/93 (BP Location: Right Arm)   Pulse 77   Temp 98.2 F (36.8 C) (Oral)   Resp 16   SpO2 96%   Physical Exam awake, alert.  Chest clear to auscultation bilaterally.  Heart with regular rate and rhythm.  Abdomen soft, positive bowel sounds, nontender.  Extremities with full range of motion.  Imaging: MR 3D Recon At Scanner  Result Date: 12/15/2019 CLINICAL DATA:  RIGHT upper quadrant abdominal pain. Nondiagnostic ultrasound with elevated liver function. Question retained bile duct stone. EXAM: MRI ABDOMEN WITHOUT AND WITH CONTRAST (INCLUDING MRCP) TECHNIQUE: Multiplanar multisequence MR imaging of the abdomen was performed both before and after the administration of intravenous contrast. Heavily T2-weighted images of the biliary and pancreatic ducts were obtained, and three-dimensional MRCP images were rendered by post processing. Exam is markedly limited by respiratory motion in this patient with reported history of claustrophobia. CONTRAST:  38mL GADAVIST GADOBUTROL 1 MMOL/ML IV SOLN COMPARISON:  Recent abdominal sonogram and CT of the abdomen and pelvis from 2018 FINDINGS: Lower chest: Incidental imaging of the lung bases is unremarkable. Hepatobiliary: Mild steatosis is suggested. Limited assessment due to respiratory motion/variation. Lobular hepatic contours. Variable signal in the liver without focal, masslike area. Geographic increased T2 signal passing through the central liver along the plane  of the middle hepatic vein with other areas of subtle increased T2 signal with lace-like pattern. Post cholecystectomy. No filling defect in the common bile duct. Intrahepatic biliary duct distension is suggested on thick slab MRCP sequences displaying irregular features and beading suggested with extension of the biliary radicles to the periphery. Pancreas: No focal pancreatic abnormality to the extent evaluated given respiratory motion Spleen:  Normal in size and contour. Adrenals/Urinary Tract: Grossly normal appearance of the adrenal gland with limited assessment. No hydronephrosis with smooth renal contours. Stomach/Bowel: Gastrointestinal tract is normal to the extent evaluated. Pelvic bowel loops are not imaged. Vascular/Lymphatic: Vascular structures in the abdomen are patent without aneurysmal dilation. Assessment is limited however due to the profound nature of respiratory artifact on post-contrast imag No adenopathy. Small periportal lymph nodes may be related underlying liver disease or reactive changes, largest approximately 11 mm near the porta hepatis. Other:  No ascites. Musculoskeletal: No suspicious bone lesions identified. IMPRESSION: 1. Limited assessment due to the profound nature of respiratory motion/variation. 2. Intrahepatic biliary ductal dilation with irregularity and beading is suggested on the thick slab MRCP sequences. Many of the images acquired on today's study are limited. Findings could be seen in the setting of primary or secondary cholangitis. Also correlate with any history of autoimmune hepatitis for PSC/autoimmune overlap. 3. Geographic increased T2 signal favored to represent developing hepatic fibrosis. This does not display frank masslike characteristics but is not well evaluated on the current study. Consider short interval follow-up CT for hepatic parenchymal assessment. 4. Mild hepatic steatosis. 5. Small periportal lymph nodes may be related underlying liver disease or  reactive changes. Attention on follow-up. Electronically Signed   By: Zetta Bills M.D.   On: 12/15/2019 10:16   MR ABDOMEN MRCP W WO CONTAST  Result Date: 12/15/2019 CLINICAL DATA:  RIGHT upper quadrant abdominal pain. Nondiagnostic ultrasound with elevated liver function. Question retained bile duct stone. EXAM: MRI ABDOMEN WITHOUT AND WITH CONTRAST (INCLUDING MRCP) TECHNIQUE: Multiplanar multisequence MR imaging of the abdomen was performed both before and after the administration of intravenous contrast. Heavily T2-weighted images of the biliary and pancreatic ducts were obtained, and three-dimensional MRCP images were rendered by post processing. Exam is markedly  limited by respiratory motion in this patient with reported history of claustrophobia. CONTRAST:  22mL GADAVIST GADOBUTROL 1 MMOL/ML IV SOLN COMPARISON:  Recent abdominal sonogram and CT of the abdomen and pelvis from 2018 FINDINGS: Lower chest: Incidental imaging of the lung bases is unremarkable. Hepatobiliary: Mild steatosis is suggested. Limited assessment due to respiratory motion/variation. Lobular hepatic contours. Variable signal in the liver without focal, masslike area. Geographic increased T2 signal passing through the central liver along the plane of the middle hepatic vein with other areas of subtle increased T2 signal with lace-like pattern. Post cholecystectomy. No filling defect in the common bile duct. Intrahepatic biliary duct distension is suggested on thick slab MRCP sequences displaying irregular features and beading suggested with extension of the biliary radicles to the periphery. Pancreas: No focal pancreatic abnormality to the extent evaluated given respiratory motion Spleen:  Normal in size and contour. Adrenals/Urinary Tract: Grossly normal appearance of the adrenal gland with limited assessment. No hydronephrosis with smooth renal contours. Stomach/Bowel: Gastrointestinal tract is normal to the extent evaluated. Pelvic  bowel loops are not imaged. Vascular/Lymphatic: Vascular structures in the abdomen are patent without aneurysmal dilation. Assessment is limited however due to the profound nature of respiratory artifact on post-contrast imag No adenopathy. Small periportal lymph nodes may be related underlying liver disease or reactive changes, largest approximately 11 mm near the porta hepatis. Other:  No ascites. Musculoskeletal: No suspicious bone lesions identified. IMPRESSION: 1. Limited assessment due to the profound nature of respiratory motion/variation. 2. Intrahepatic biliary ductal dilation with irregularity and beading is suggested on the thick slab MRCP sequences. Many of the images acquired on today's study are limited. Findings could be seen in the setting of primary or secondary cholangitis. Also correlate with any history of autoimmune hepatitis for PSC/autoimmune overlap. 3. Geographic increased T2 signal favored to represent developing hepatic fibrosis. This does not display frank masslike characteristics but is not well evaluated on the current study. Consider short interval follow-up CT for hepatic parenchymal assessment. 4. Mild hepatic steatosis. 5. Small periportal lymph nodes may be related underlying liver disease or reactive changes. Attention on follow-up. Electronically Signed   By: Zetta Bills M.D.   On: 12/15/2019 10:16   US Abdomen Limited RUQ  Result Date: 12/03/2019 CLINICAL DATA:  Right upper quadrant EXAM: ULTRASOUND ABDOMEN LIMITED RIGHT UPPER QUADRANT COMPARISON:  None. FINDINGS: Gallbladder: Status post cholecystectomy Common bile duct: Diameter: 4.5 mm Liver: Increased echotexture seen throughout. No focal abnormality or biliary ductal dilatation. Portal vein is patent on color Doppler imaging with normal direction of blood flow towards the liver. Other: None. IMPRESSION: Hepatic steatosis. Status post cholecystectomy Electronically Signed   By: Prudencio Pair M.D.   On: 12/03/2019 22:43     Labs:  CBC: Recent Labs    07/12/19 1224 07/16/19 1048 12/03/19 1851 12/08/19 1118  WBC 6.4 10.3 7.9 6.7  HGB 12.6 11.8* 12.9 12.3  HCT 38.9 37.0 39.9 36.8  PLT 291 281 272 264.0    COAGS: Recent Labs    07/12/19 1224 12/08/19 1118  INR 1.0 1.1*  APTT 26  --     BMP: Recent Labs    01/14/19 0807 01/14/19 0807 07/12/19 1224 07/16/19 1048 12/03/19 1851 12/08/19 1118  NA 142  --  142  --  140 136  K 4.0  --  4.1  --  3.4* 4.0  CL 103  --  108  --  104 104  CO2 25  --  27  --  26 27  GLUCOSE 90  --  102*  --  123* 92  BUN 9  --  12  --  9 9  CALCIUM 9.3  --  9.1  --  9.3 9.0  CREATININE 1.00   < > 0.92 0.92 0.91 0.96  GFRNONAA 59*  --  >60 >60 >60  --   GFRAA 68  --  >60 >60 >60  --    < > = values in this interval not displayed.    LIVER FUNCTION TESTS: Recent Labs    01/14/19 0807 01/14/19 0807 04/21/19 0827 07/12/19 1224 12/03/19 1851 12/08/19 1118 12/15/19 0758  BILITOT 0.6  --   --  1.0 2.3* 2.6*  --   AST 18  --   --  23 964* 656*  --   ALT 6   < > 6 12 379* 305*  --   ALKPHOS 97  --   --  106 290* 296*  --   PROT 6.6  --   --  7.5 8.2* 7.9 7.1  ALBUMIN 4.2  --   --  3.8 3.5 3.5  --    < > = values in this interval not displayed.    TUMOR MARKERS: No results for input(s): AFPTM, CEA, CA199, CHROMGRNA in the last 8760 hours.  Assessment and Plan: 69 y.o. female with past medical history of GERD, hyperlipidemia, hypertension, prior cholecystectomy, hypothyroidism, IBS, and now with findings of elevated liver function tests, positive ANA, positive ASMA and mild hepatic steatosis by imaging.  She presents today for image guided random core liver biopsy for further evaluation/rule out autoimmune hepatitis/PSC.Risks and benefits of procedure was discussed with the patient including, but not limited to bleeding, infection, damage to adjacent structures or low yield requiring additional tests.  All of the questions were answered and there is  agreement to proceed.  Consent signed and in chart.     Thank you for this interesting consult.  I greatly enjoyed meeting JOVITA PERSING and look forward to participating in their care.  A copy of this report was sent to the requesting provider on this date.  Electronically Signed: D. Rowe Robert, PA-C 12/31/2019, 11:49 AM   I spent a total of 25 minutes    in face to face in clinical consultation, greater than 50% of which was counseling/coordinating care for image guided random core liver biopsy

## 2019-12-31 NOTE — Procedures (Signed)
Interventional Radiology Procedure:   Indications: Elevated liver enzymes  Procedure: US guided liver biopsy  Findings: 3 cores from right hepatic lobe  Complications: None     EBL: less than 10 ml  Plan: Bedrest 3 hours   Sheniqua Carolan R. Anselm Pancoast, MD  Pager: [phone]

## 2020-01-03 ENCOUNTER — Other Ambulatory Visit: Payer: Self-pay

## 2020-01-04 LAB — SURGICAL PATHOLOGY

## 2020-01-06 ENCOUNTER — Other Ambulatory Visit: Payer: Self-pay

## 2020-01-06 ENCOUNTER — Telehealth: Payer: Self-pay | Admitting: Gastroenterology

## 2020-01-06 ENCOUNTER — Telehealth: Payer: Self-pay | Admitting: Cardiology

## 2020-01-06 DIAGNOSIS — R7989 Other specified abnormal findings of blood chemistry: Secondary | ICD-10-CM

## 2020-01-06 DIAGNOSIS — E785 Hyperlipidemia, unspecified: Secondary | ICD-10-CM

## 2020-01-06 DIAGNOSIS — Z79899 Other long term (current) drug therapy: Secondary | ICD-10-CM

## 2020-01-06 NOTE — Telephone Encounter (Signed)
Can you find out when she started the metoprolol?

## 2020-01-06 NOTE — Telephone Encounter (Signed)
I doubt the metoprolol is the problem. Much more like is her statin (cholesterol med).   thanks

## 2020-01-06 NOTE — Telephone Encounter (Signed)
Patient wanted to know if she needs labs drawn before her upcoming appointment 01/18/20. Please advise

## 2020-01-06 NOTE — Telephone Encounter (Signed)
Pt states that she spoke with Dr. Ardis Hughs this morning about the medications that she takes and she forgot to tell him that she also takes metoprolol 25 mg.

## 2020-01-06 NOTE — Telephone Encounter (Signed)
The pt has been advised and thanked me for the call

## 2020-01-06 NOTE — Telephone Encounter (Signed)
Will have Dr Marlou Porch to review for any necessary labs.  Last Lipid panel 12/20 was taking Atorvastatin 80 mg daily.  Developed abdominal pain with elevated liver functions and statin was stopped 12/08/19.  Had core liver biopsy - possible drug induced liver injury.  GI following.  Liver enzymes are improving since off statin.

## 2020-01-06 NOTE — Telephone Encounter (Signed)
She states she started it over a year ago.  The same time she stated the cholesterol medication

## 2020-01-06 NOTE — Telephone Encounter (Signed)
FYI see pt message regarding medication

## 2020-01-07 NOTE — Telephone Encounter (Signed)
Spoke with pt.  She will come into the office for lab work Friday 8/27.  GI needs a hepatic panel according to documentation.  Lipid and hepatic panel ordered.

## 2020-01-07 NOTE — Telephone Encounter (Signed)
Sure, could be interesting now that we are holding/off of the atorvastatin. Lets check a lipid panel and another ALT. Candee Furbish, MD

## 2020-01-12 DIAGNOSIS — Z4789 Encounter for other orthopedic aftercare: Secondary | ICD-10-CM | POA: Diagnosis not present

## 2020-01-12 DIAGNOSIS — Z96652 Presence of left artificial knee joint: Secondary | ICD-10-CM | POA: Diagnosis not present

## 2020-01-12 DIAGNOSIS — Z471 Aftercare following joint replacement surgery: Secondary | ICD-10-CM | POA: Diagnosis not present

## 2020-01-14 ENCOUNTER — Other Ambulatory Visit: Payer: Self-pay

## 2020-01-14 ENCOUNTER — Other Ambulatory Visit: Payer: Medicare Other | Admitting: *Deleted

## 2020-01-14 DIAGNOSIS — E785 Hyperlipidemia, unspecified: Secondary | ICD-10-CM

## 2020-01-14 DIAGNOSIS — Z79899 Other long term (current) drug therapy: Secondary | ICD-10-CM | POA: Diagnosis not present

## 2020-01-14 LAB — LIPID PANEL
Chol/HDL Ratio: 4.3 ratio (ref 0.0–4.4)
Cholesterol, Total: 184 mg/dL (ref 100–199)
HDL: 43 mg/dL (ref >39)
LDL Chol Calc (NIH): 116 mg/dL — ABNORMAL HIGH (ref 0–99)
Triglycerides: 139 mg/dL (ref 0–149)
VLDL Cholesterol Cal: 25 mg/dL (ref 5–40)

## 2020-01-14 LAB — HEPATIC FUNCTION PANEL
ALT: 15 IU/L (ref 0–32)
AST: 49 IU/L — ABNORMAL HIGH (ref 0–40)
Albumin: 3.4 g/dL — ABNORMAL LOW (ref 3.8–4.8)
Alkaline Phosphatase: 144 IU/L — ABNORMAL HIGH (ref 48–121)
Bilirubin Total: 0.6 mg/dL (ref 0.0–1.2)
Bilirubin, Direct: 0.3 mg/dL (ref 0.00–0.40)
Total Protein: 6.7 g/dL (ref 6.0–8.5)

## 2020-01-18 ENCOUNTER — Other Ambulatory Visit: Payer: Self-pay

## 2020-01-18 ENCOUNTER — Ambulatory Visit (INDEPENDENT_AMBULATORY_CARE_PROVIDER_SITE_OTHER): Payer: Medicare Other | Admitting: Cardiology

## 2020-01-18 ENCOUNTER — Encounter: Payer: Self-pay | Admitting: Cardiology

## 2020-01-18 VITALS — BP 110/70 | HR 97 | Ht 61.0 in | Wt 180.0 lb

## 2020-01-18 DIAGNOSIS — E785 Hyperlipidemia, unspecified: Secondary | ICD-10-CM | POA: Diagnosis not present

## 2020-01-18 DIAGNOSIS — I2510 Atherosclerotic heart disease of native coronary artery without angina pectoris: Secondary | ICD-10-CM

## 2020-01-18 NOTE — Patient Instructions (Signed)

## 2020-01-18 NOTE — Progress Notes (Signed)
Cardiology Office Note:    Date:  01/18/2020   ID:  Dominique Mooney, DOB Nov 15, 1950, MRN [id number]  PCP:  Merrilee Seashore, MD  [facility] Cardiologist:  Candee Furbish, MD  [facility] Electrophysiologist:  None   Referring MD: Merrilee Seashore, MD    History of Present Illness:    Dominique Mooney is a 69 y.o. female here for follow-up of coronary artery disease, catheterization back in 2019 showed no flow-limiting coronary artery disease., hyperlipidemia, elevated LFTs.  Recently LFTs have been increased.  Saw Dr. Oretha Caprice with GI.  Biopsy performed.  LFTs have improved after stopping atorvastatin.  Past Medical History:  Diagnosis Date  . Allergic rhinitis       . Arthritis   . Asthma       . Chest pressure    a. Normal Stress Echo 05/2010  . Complication of anesthesia   . GERD (gastroesophageal reflux disease)   . Hyperlipidemia   . Hypertension   . Hypothyroidism   . Irritable bowel syndrome   . Pancreatitis    after ERCP  . Pneumonia   . PONV (postoperative nausea and vomiting)     Past Surgical History:  Procedure Laterality Date  . BRAVO [place] STUDY  05/02/2011   Procedure: BRAVO [place] STUDY;  Surgeon: Owens Loffler, MD;  Location: [facility];  Service: Endoscopy;  Laterality: N/A;  48 hour wirless pH test (Bravo Test)  . CHOLECYSTECTOMY    . ESOPHAGOGASTRODUODENOSCOPY  05/02/2011   Procedure: ESOPHAGOGASTRODUODENOSCOPY (EGD);  Surgeon: Owens Loffler, MD;  Location: [facility];  Service: Endoscopy;  Laterality: N/A;  . HERNIA REPAIR    . LEFT HEART CATH AND CORONARY ANGIOGRAPHY N/A 06/26/2017   Procedure: LEFT HEART CATH AND CORONARY ANGIOGRAPHY;  Surgeon: Belva Crome, MD;  Location: [place] CV [facility];  Service: Cardiovascular;  Laterality: N/A;  . MENISCUS REPAIR Left 03/18/2016   and repair of cracked bone  . SALPINGOOPHORECTOMY    . TOTAL ABDOMINAL HYSTERECTOMY    . TOTAL KNEE ARTHROPLASTY Left 07/16/2019   Procedure: TOTAL KNEE ARTHROPLASTY;   Surgeon: Sydnee Cabal, MD;  [facility];  Service: Orthopedics;  Laterality: Left;    Current Medications: Current Meds  Medication Sig  . acetaminophen (TYLENOL) 500 MG tablet Take 1,000 mg by mouth every 6 (six) hours as needed for mild pain.   Marland Kitchen albuterol (PROVENTIL HFA) 108 (90 Base) MCG/ACT inhaler INHALE 2 PUFFS INTO THE LUNGS EVERY 6 (SIX) HOURS AS NEEDED.  Marland Kitchen aspirin 81 MG EC tablet Take 81 mg by mouth daily.  . budesonide-formoterol (SYMBICORT) 80-4.5 MCG/ACT inhaler Inhale 2 puffs into the lungs 2 (two) times daily.  . cetirizine (ZYRTEC) 10 MG tablet Take 10 mg by mouth daily as needed.   . fish oil-omega-3 fatty acids 1000 MG capsule Take 1 g by mouth daily.   . fluticasone (FLONASE) 50 MCG/ACT nasal spray Place 2 sprays into both nostrils daily.  . metoprolol succinate (TOPROL-XL) 25 MG 24 hr tablet Take 1 tablet (25 mg total) by mouth daily.  . Multiple Vitamin (MULITIVITAMIN WITH MINERALS) TABS Take 1 tablet by mouth daily.   Marland Kitchen SYNTHROID 100 MCG tablet Take 100 mcg by mouth daily.     Allergies:   Penicillins   Social History   Socioeconomic History  . Marital status: Married    Spouse name: Not on file  . Number of children: 2  . Years of education: Not on file  . Highest education level: Not on file  Occupational History  . Occupation: retired    Fish farm manager: [employer]  Tobacco Use  . Smoking status: Never Smoker  . Smokeless tobacco: Never Used  Vaping Use  . Vaping Use: Never used  Substance and Sexual Activity  . Alcohol use: No  . Drug use: No  . Sexual activity: Not on file  Other Topics Concern  . 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   Currently retired as of 07/2012      [place] [facility] (11/26/16):   Originally from [place] [facility]. Has always lived in [place]. She retired from [employer]. 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 No pets currently. No bird  exposure. No mold or hot tub exposure. Does have carpet in her bedroom. No feather bedding. She has blinds & draperies. No indoor plants. Enjoys helping out in her church and with nonprofit work. Enjoys going to the beach.    Social Determinants of Health   Financial Resource Strain:   . Difficulty of Paying Living Expenses: Not on file  Food Insecurity:   . Worried About Charity fundraiser in the Last Year: Not on file  . Ran Out of Food in the Last Year: Not on file  Transportation Needs:   . Lack of Transportation (Medical): Not on file  . Lack of Transportation (Non-Medical): Not on file  Physical Activity:   . Days of Exercise per Week: Not on file  . Minutes of Exercise per Session: Not on file  Stress:   . Feeling of Stress : Not on file  Social Connections:   . Frequency of Communication with Friends and Family: Not on file  . Frequency of Social Gatherings with Friends and Family: Not on file  . Attends Religious Services: Not on file  . Active Member of Clubs or Organizations: Not on file  . Attends Archivist Meetings: Not on file  . Marital Status: Not on file     Family History: The patient's family history includes Allergies in her brother, sister, and son; Colon polyps in her sister; Heart attack in her brother and brother; Stroke in her father and mother. There is no history of Colon cancer, Stomach cancer, Esophageal cancer, Lung disease, Cancer, or Rectal cancer.  ROS:   Please see the history of present illness.     All other systems reviewed and are negative.  EKGs/Labs/Other Studies Reviewed:    The following studies were reviewed today: Liver biopsy results personally reviewed-hepatitis    Recent Labs: 12/08/2019: TSH 8.20 12/31/2019: BUN 12; Creatinine, Ser 0.82; Hemoglobin 12.5; Platelets 224; Potassium 4.1; Sodium 142 01/14/2020: ALT 15  Recent Lipid Panel    Component Value Date/Time   CHOL 184 01/14/2020 0940   TRIG 139 01/14/2020  0940   HDL 43 01/14/2020 0940   CHOLHDL 4.3 01/14/2020 0940   LDLCALC 116 (H) 01/14/2020 0940    Physical Exam:    VS:  BP 110/70   Pulse 97   Ht 5\' 1"  (1.549 m)   Wt 180 lb (81.6 kg)   SpO2 98%   BMI 34.01 kg/m     Wt Readings from Last 3 Encounters:  01/18/20 180 lb (81.6 kg)  12/31/19 179 lb (81.2 kg)  12/08/19 182 lb (82.6 kg)     GEN:  Well nourished, well developed in no acute distress HEENT: Normal NECK: No JVD; No carotid bruits LYMPHATICS:  No lymphadenopathy CARDIAC: RRR, no murmurs, rubs, gallops RESPIRATORY:  Clear to auscultation without rales, wheezing or rhonchi  ABDOMEN: Soft, non-tender, non-distended MUSCULOSKELETAL:  No edema; No deformity  SKIN: Warm and dry NEUROLOGIC:  Alert and oriented x 3 PSYCHIATRIC:  Normal affect   ASSESSMENT:    1. Coronary artery calcification seen on CT scan   2. Hyperlipidemia, unspecified hyperlipidemia type    PLAN:    In order of problems listed above:  Coronary artery disease -Coronary CT was performed and FFR was 0.74 in the LAD distal distribution.  She did undergo cardiac catheterization that showed a first diagonal 40% obstruction and 30% distal LAD wrapping around the apex.  Hyperlipidemia -Was on atorvastatin 40 mg which was increased to 80 mg after her LDL was 82 back in August 2020.  Unfortunately, her LFTs increased significantly.  Dr. Ardis Hughs saw her, liver biopsy.  Biopsy result reviewed.  Hepatitis noted.  She is now off of the statin and doing better from a LFT standpoint.  Hepatitis -LFTs have come down nicely.  She was on atorvastatin for quite some time prior to the episode.  She is now off.  Question etiology. -Perhaps in 6 months, we could trial low-dose atorvastatin once again with close monitoring of LFTs.  I would keep her off for now.  Dr. Ardis Hughs will be seeing back in September.    Medication Adjustments/Labs and Tests Ordered: Current medicines are reviewed at length with the patient  today.  Concerns regarding medicines are outlined above.  No orders of the defined types were placed in this encounter.  No orders of the defined types were placed in this encounter.   Patient Instructions  Medication Instructions:  The current medical regimen is effective;  continue present plan and medications.  *If you need a refill on your cardiac medications before your next appointment, please call your pharmacy*  Follow-Up: At [facility], you and your health needs are our priority.  As part of our continuing mission to provide you with exceptional heart care, we have created designated Provider Care Teams.  These Care Teams include your primary Cardiologist (physician) and Advanced Practice Providers (APPs -  Physician Assistants and Nurse Practitioners) who all work together to provide you with the care you need, when you need it.  We recommend signing up for the patient portal called "MyChart".  Sign up information is provided on this After Visit Summary.  MyChart is used to connect with patients for Virtual Visits (Telemedicine).  Patients are able to view lab/test results, encounter notes, upcoming appointments, etc.  Non-urgent messages can be sent to your provider as well.   To learn more about what you can do with MyChart, go to [email or web address].    Your next appointment:   6 month(s)  The format for your next appointment:   In Person  Provider:   Candee Furbish, MD   Thank you for choosing [facility]!!        Signed, Candee Furbish, MD  01/18/2020 11:08 AM    [place]

## 2020-01-20 ENCOUNTER — Telehealth: Payer: Self-pay

## 2020-01-20 DIAGNOSIS — R7989 Other specified abnormal findings of blood chemistry: Secondary | ICD-10-CM

## 2020-01-20 NOTE — Telephone Encounter (Signed)
-----   Message from Yevette Edwards, RN sent at 01/06/2020  8:16 AM EDT ----- Regarding: Labs Repeat hepatic function panel, order in epic

## 2020-01-20 NOTE — Telephone Encounter (Signed)
The patient has been notified of this information and all questions answered.

## 2020-01-20 NOTE — Telephone Encounter (Signed)
Pt due for repeat LFTs, looks like she had labs drawn on 01/14/20. Please advise. Thank you

## 2020-01-20 NOTE — Telephone Encounter (Signed)
Left message on machine to call back lab order entered

## 2020-01-20 NOTE — Telephone Encounter (Signed)
LFTs continue to improve. Please let her know. REpeat LFTs in 4 weeks, shortly prior to her OV with me. Thanks

## 2020-01-22 ENCOUNTER — Other Ambulatory Visit: Payer: Self-pay | Admitting: Cardiology

## 2020-02-03 ENCOUNTER — Other Ambulatory Visit (INDEPENDENT_AMBULATORY_CARE_PROVIDER_SITE_OTHER): Payer: Medicare Other

## 2020-02-03 DIAGNOSIS — R7989 Other specified abnormal findings of blood chemistry: Secondary | ICD-10-CM

## 2020-02-03 LAB — HEPATIC FUNCTION PANEL
ALT: 9 U/L (ref 0–35)
AST: 33 U/L (ref 0–37)
Albumin: 3.5 g/dL (ref 3.5–5.2)
Alkaline Phosphatase: 102 U/L (ref 39–117)
Bilirubin, Direct: 0.2 mg/dL (ref 0.0–0.3)
Total Bilirubin: 0.6 mg/dL (ref 0.2–1.2)
Total Protein: 6.9 g/dL (ref 6.0–8.3)

## 2020-02-04 ENCOUNTER — Other Ambulatory Visit: Payer: Self-pay

## 2020-02-04 DIAGNOSIS — R7989 Other specified abnormal findings of blood chemistry: Secondary | ICD-10-CM

## 2020-02-07 DIAGNOSIS — H40012 Open angle with borderline findings, low risk, left eye: Secondary | ICD-10-CM | POA: Diagnosis not present

## 2020-02-07 DIAGNOSIS — H401411 Capsular glaucoma with pseudoexfoliation of lens, right eye, mild stage: Secondary | ICD-10-CM | POA: Diagnosis not present

## 2020-02-07 DIAGNOSIS — H04123 Dry eye syndrome of bilateral lacrimal glands: Secondary | ICD-10-CM | POA: Diagnosis not present

## 2020-02-11 DIAGNOSIS — E039 Hypothyroidism, unspecified: Secondary | ICD-10-CM | POA: Diagnosis not present

## 2020-02-24 ENCOUNTER — Other Ambulatory Visit: Payer: Self-pay | Admitting: Obstetrics and Gynecology

## 2020-02-24 DIAGNOSIS — Z1231 Encounter for screening mammogram for malignant neoplasm of breast: Secondary | ICD-10-CM

## 2020-02-29 DIAGNOSIS — E039 Hypothyroidism, unspecified: Secondary | ICD-10-CM | POA: Diagnosis not present

## 2020-02-29 DIAGNOSIS — E1122 Type 2 diabetes mellitus with diabetic chronic kidney disease: Secondary | ICD-10-CM | POA: Diagnosis not present

## 2020-03-13 ENCOUNTER — Encounter: Payer: Self-pay | Admitting: Gastroenterology

## 2020-03-13 ENCOUNTER — Ambulatory Visit (INDEPENDENT_AMBULATORY_CARE_PROVIDER_SITE_OTHER): Payer: Medicare Other | Admitting: Gastroenterology

## 2020-03-13 VITALS — BP 112/80 | HR 75 | Ht 61.0 in | Wt 181.0 lb

## 2020-03-13 DIAGNOSIS — K219 Gastro-esophageal reflux disease without esophagitis: Secondary | ICD-10-CM

## 2020-03-13 DIAGNOSIS — E785 Hyperlipidemia, unspecified: Secondary | ICD-10-CM | POA: Diagnosis not present

## 2020-03-13 DIAGNOSIS — K754 Autoimmune hepatitis: Secondary | ICD-10-CM | POA: Diagnosis not present

## 2020-03-13 DIAGNOSIS — E039 Hypothyroidism, unspecified: Secondary | ICD-10-CM | POA: Diagnosis not present

## 2020-03-13 MED ORDER — ESOMEPRAZOLE MAGNESIUM 40 MG PO CPDR
DELAYED_RELEASE_CAPSULE | ORAL | 11 refills | Status: DC
Start: 2020-03-13 — End: 2021-08-07

## 2020-03-13 NOTE — Progress Notes (Signed)
Review of gastrointestinal problems:  1. Dysphasia, nausea 2009.EGD may 2009 was normal. Symptoms thought to be GERD related. Symptoms improved with addition of H2 blocker (March 2011). Barium esophagram 2011 was normal.   EGD August 2020 for odynophagia showed mild nonspecific gastritis, pathology showed no H. pylori. 2. routine risk for colon cancer,colonoscopy may 2009: hemorrhoids only. Next colonoscopy may 2019; Intermittent IBS-like discomforts that are helped with antispasmodics usually. Repeated colonoscopy 07/2013 for some bowel habit changes, it was completely normal. Recommended recall colonoscopy for cancer screening 07/2023.  Repeat colonoscopy August 2020 due to change in bowel habits showed left-sided diverticulosis, a single subcentimeter hyperplastic polyp was removed. 3. post ERCP pancreatitis, 2000  4. laparoscopic cholecystectomy,2000  5. GERD: EGD 12/12 with B[employer] pH test 48 hours, done 05/02/11 while still on BID PPI (omeprazole) and bedtime H2 blocker was + for continued pathologic acid exposure. 2/13 felt much better on bid nexium than other PPI and nightly H2 blocker 6. Elevated liver tests, likely due to statin toxicity: July 2021 abdominal pain episode evaluated in the emergency room, ultrasound showed status post cholecystectomy, bile duct 4.5 mm. AST 964, ALT 379, alk phos 290, total bilirubin 2.3 acute viral hepatitis panel was negative: Battery of blood tests was normal except for ANA positive at 1-80 titer, anti-smooth muscle antibody slightly elevated as well. SPEP/Gollan is well. 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 She stopped it and her LFTs resolved to normal after about 2 months   HPI: This is a very pleasant 69 year old woman whom I last saw in the office about 3  months ago.  LFTs 1 month ago were all completely normal.  She is bothered by feeling of fluid washing up from her esophagus into her mouth.  It is a bit acidic tasting.  She does not have dysphagia.  Her weight is stable.  She is only taking Pepcid 20 mg with her lunch meals on a daily basis.  I am not sure why she or when she ever stops taking Nexium.  ROS: complete GI ROS as described in HPI, all other review negative.  Constitutional:  No unintentional weight loss   Past Medical History:  Diagnosis Date  . Allergic rhinitis       . Arthritis   . Asthma       . Chest pressure    a. Normal Stress Echo 05/2010  . Complication of anesthesia   . GERD (gastroesophageal reflux disease)   . Hyperlipidemia   . Hypertension   . Hypothyroidism   . Irritable bowel syndrome   . Pancreatitis    after ERCP  . Pneumonia   . PONV (postoperative nausea and vomiting)     Past Surgical History:  Procedure Laterality Date  . BRAVO [place] STUDY  05/02/2011   Procedure: BRAVO [place] STUDY;  Surgeon: Owens Loffler, MD;  [facility];  Service: Endoscopy;  Laterality: N/A;  48 hour wirless pH test (Bravo Test)  . CHOLECYSTECTOMY    . ESOPHAGOGASTRODUODENOSCOPY  05/02/2011   Procedure: ESOPHAGOGASTRODUODENOSCOPY (EGD);  Surgeon: Owens Loffler, MD;  Location: [facility];  Service: Endoscopy;  Laterality: N/A;  . HERNIA REPAIR    . LEFT HEART CATH AND CORONARY ANGIOGRAPHY N/A 06/26/2017   Procedure: LEFT HEART CATH AND CORONARY ANGIOGRAPHY;  Surgeon: Belva Crome, MD;  Location: [place] CV LAB;  Service: Cardiovascular;  Laterality: N/A;  . MENISCUS REPAIR Left 03/18/2016   and repair of cracked bone  . SALPINGOOPHORECTOMY    . TOTAL ABDOMINAL HYSTERECTOMY    . TOTAL KNEE ARTHROPLASTY Left 07/16/2019   Procedure: TOTAL KNEE ARTHROPLASTY;  Surgeon: Sydnee Cabal, MD;  [facility]: [facility];  Service: Orthopedics;  Laterality: Left;    Current Outpatient Medications  Medication Sig  Dispense Refill  . acetaminophen (TYLENOL) 500 MG tablet Take 1,000 mg by mouth every 6 (six) hours as needed for mild pain.     Marland Kitchen albuterol (PROVENTIL HFA) 108 (90 Base) MCG/ACT inhaler INHALE 2 PUFFS INTO THE LUNGS EVERY 6 (SIX) HOURS AS NEEDED. 1 Inhaler 3  . aspirin 81 MG EC tablet Take 81 mg by mouth daily.    . budesonide-formoterol (SYMBICORT) 80-4.5 MCG/ACT inhaler Inhale 2 puffs into the lungs 2 (two) times daily. 1 Inhaler 3  . cetirizine (ZYRTEC) 10 MG tablet Take 10 mg by mouth daily as needed.     . fish oil-omega-3 fatty acids 1000 MG capsule Take 1 g by mouth daily.     . fluticasone (FLONASE) 50 MCG/ACT nasal spray Place 2 sprays into both nostrils daily. 16 g 2  . metoprolol succinate (TOPROL-XL) 25 MG 24 hr tablet TAKE 1 TABLET BY MOUTH EVERY DAY 90 tablet 3  . Multiple Vitamin (MULITIVITAMIN WITH MINERALS) TABS Take 1 tablet by mouth daily.     Marland Kitchen SYNTHROID 100 MCG tablet Take 88 mcg by mouth daily.      No current facility-administered medications for this visit.    Allergies as of 03/13/2020 - Review Complete 03/13/2020  Allergen Reaction Noted  . Penicillins Rash 11/24/2014    Family History  Problem Relation Age of Onset  . Stroke Mother   . Stroke Father   . Allergies Brother   . Allergies Sister   . Colon polyps Sister   . Heart attack Brother   . Heart attack Brother   . Allergies Son   . Colon cancer Neg Hx   . Stomach cancer Neg Hx   . Esophageal cancer Neg Hx   . Lung disease Neg Hx   . Cancer Neg Hx   . Rectal cancer Neg Hx     Social History   Socioeconomic History  . Marital status: Married    Spouse name: Not on file  . Number of children: 2  . Years of education: Not on file  . Highest education level: Not on file  Occupational History  . Occupation: retired    Fish farm manager: [employer]  Tobacco Use  . Smoking status: Never Smoker  . Smokeless tobacco: Never Used  Vaping Use  . Vaping Use: Never used  Substance and Sexual  Activity  . Alcohol use: No  . Drug use: No  . Sexual activity: Not on file  Other Topics Concern  . 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   Currently retired as of 07/2012      [place] [facility] (11/26/16):   Originally from [place]. Has always lived in [place]. She retired from [employer]. 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 No pets currently. No bird exposure. No mold or hot tub exposure. Does have carpet in her bedroom. No feather bedding. She has blinds & draperies. No indoor plants. Enjoys helping out in her church and with nonprofit work. Enjoys going to the  beach.    Social Determinants of Health   Financial Resource Strain:   . Difficulty of Paying Living Expenses: Not on file  Food Insecurity:   . Worried About Charity fundraiser in the Last Year: Not on file  . Ran Out of Food in the Last Year: Not on file  Transportation Needs:   . Lack of Transportation (Medical): Not on file  . Lack of Transportation (Non-Medical): Not on file  Physical Activity:   . Days of Exercise per Week: Not on file  . Minutes of Exercise per Session: Not on file  Stress:   . Feeling of Stress : Not on file  Social Connections:   . Frequency of Communication with Friends and Family: Not on file  . Frequency of Social Gatherings with Friends and Family: Not on file  . Attends Religious Services: Not on file  . Active Member of Clubs or Organizations: Not on file  . Attends Archivist Meetings: Not on file  . Marital Status: Not on file  Intimate Partner Violence:   . Fear of Current or Ex-Partner: Not on file  . Emotionally Abused: Not on file  . Physically Abused: Not on file  . Sexually Abused: Not on file     Physical Exam: BP 112/80   Pulse 75   Ht $R'5\' 1"'hY$  (1.549 m)   Wt 181 lb (82.1 kg)   SpO2 95%   BMI 34.20 kg/m  Constitutional: generally well-appearing Psychiatric: alert and oriented  x3 Abdomen: soft, nontender, nondistended, no obvious ascites, no peritoneal signs, normal bowel sounds No peripheral edema noted in lower extremities  Assessment and plan: 69 y.o. female with improved LFTs after stopping statin, GERD  We already have her in our reminder system for repeat LFTs in about 2 months from now.  Previously we had worked up for GERD and acid related issues.  I prove that she was having pathologic acid despite proton pump inhibitor use.  She is not in taking proton pump inhibitors right now.  I recommend she get back on Nexium 40 mg shortly before breakfast meal every day.  I also recommended that she switch the way she is taking her Pepcid to nightly dosing.  She has no alarm symptoms.  Please see the "Patient Instructions" section for addition details about the plan.  Owens Loffler, MD [facility] 03/13/2020, 1:45 PM   Total time on date of encounter was 30 minutes (this included time spent preparing to see the patient reviewing records; obtaining and/or reviewing separately obtained history; performing a medically appropriate exam and/or evaluation; counseling and educating the patient and family if present; ordering medications, tests or procedures if applicable; and documenting clinical information in the health record).

## 2020-03-13 NOTE — Patient Instructions (Addendum)
If you are age 67 or older, your body mass index should be between 23-30. Your Body mass index is 34.2 kg/m. If this is out of the aforementioned range listed, please consider follow up with your Primary Care Provider.  If you are age 55 or younger, your body mass index should be between 19-25. Your Body mass index is 34.2 kg/m. If this is out of the aformentioned range listed, please consider follow up with your Primary Care Provider.   We have sent the following medications to your pharmacy for you to pick up at your convenience:  START: Nexium 40mg  take one capsule every morning 30 - 60 minutes prior to breakfast each morning.  TAKE: Pepcid 20mg  take one tablet at bedtime each night.  Thank you for entrusting me with your care and choosing [facility].  Dr Ardis Hughs

## 2020-03-16 DIAGNOSIS — K754 Autoimmune hepatitis: Secondary | ICD-10-CM | POA: Diagnosis not present

## 2020-03-16 DIAGNOSIS — Z23 Encounter for immunization: Secondary | ICD-10-CM | POA: Diagnosis not present

## 2020-03-16 DIAGNOSIS — E785 Hyperlipidemia, unspecified: Secondary | ICD-10-CM | POA: Diagnosis not present

## 2020-03-16 DIAGNOSIS — E559 Vitamin D deficiency, unspecified: Secondary | ICD-10-CM | POA: Diagnosis not present

## 2020-03-16 DIAGNOSIS — I2510 Atherosclerotic heart disease of native coronary artery without angina pectoris: Secondary | ICD-10-CM | POA: Diagnosis not present

## 2020-03-16 DIAGNOSIS — E039 Hypothyroidism, unspecified: Secondary | ICD-10-CM | POA: Diagnosis not present

## 2020-03-29 ENCOUNTER — Other Ambulatory Visit: Payer: Self-pay

## 2020-03-29 ENCOUNTER — Ambulatory Visit
Admission: RE | Admit: 2020-03-29 | Discharge: 2020-03-29 | Disposition: A | Discharge status: Home / Self Care | Payer: Medicare Other | Source: Ambulatory Visit | Attending: Obstetrics and Gynecology | Admitting: Obstetrics and Gynecology

## 2020-03-29 DIAGNOSIS — Z1231 Encounter for screening mammogram for malignant neoplasm of breast: Secondary | ICD-10-CM

## 2020-05-01 DIAGNOSIS — E039 Hypothyroidism, unspecified: Secondary | ICD-10-CM | POA: Diagnosis not present

## 2020-05-08 DIAGNOSIS — E039 Hypothyroidism, unspecified: Secondary | ICD-10-CM | POA: Diagnosis not present

## 2020-05-23 ENCOUNTER — Other Ambulatory Visit (INDEPENDENT_AMBULATORY_CARE_PROVIDER_SITE_OTHER): Payer: Medicare Other

## 2020-05-23 DIAGNOSIS — R7989 Other specified abnormal findings of blood chemistry: Secondary | ICD-10-CM | POA: Diagnosis not present

## 2020-05-23 LAB — HEPATIC FUNCTION PANEL
ALT: 6 U/L (ref 0–35)
AST: 21 U/L (ref 0–37)
Albumin: 4 g/dL (ref 3.5–5.2)
Alkaline Phosphatase: 106 U/L (ref 39–117)
Bilirubin, Direct: 0.2 mg/dL (ref 0.0–0.3)
Total Bilirubin: 0.9 mg/dL (ref 0.2–1.2)
Total Protein: 7.2 g/dL (ref 6.0–8.3)

## 2020-05-29 ENCOUNTER — Telehealth: Payer: Self-pay | Admitting: Gastroenterology

## 2020-05-29 NOTE — Telephone Encounter (Signed)
The pt has been called and scheduled for 3/1.  The pt thanked me for calling back.

## 2020-05-29 NOTE — Telephone Encounter (Signed)
Dr Ardis Hughs I spoke with the pt and she was advised of the lab results on 1/4.  She tells me that she has continued abd discomfort in the center of her abd and GERD.  She is taking nexium 40 mg daily as well as pepcid at bedtime.  She is asking to have an endo and colon repeated.  Last endo colon was 12/2018. Please advise

## 2020-05-29 NOTE — Telephone Encounter (Signed)
Patient is seeking advise also requesting lab results

## 2020-05-29 NOTE — Telephone Encounter (Signed)
Very unlikely anything has changed since 1 1/2 year ago colon/EGD.  Please offer my next available OV instead   thanks

## 2020-06-13 ENCOUNTER — Telehealth: Payer: Self-pay | Admitting: Gastroenterology

## 2020-06-13 NOTE — Telephone Encounter (Signed)
We spoke on the phone.  She was at her dentist recently and her dentist noticed some gum disease and told her that these gum, teeth problems are probably from an autoimmune disease.  I explained to her that I am not sure really what to make of that bit of information from her dentist.  I recommended that she call her primary care physician to discuss further.

## 2020-06-14 DIAGNOSIS — E039 Hypothyroidism, unspecified: Secondary | ICD-10-CM | POA: Diagnosis not present

## 2020-06-14 DIAGNOSIS — I2510 Atherosclerotic heart disease of native coronary artery without angina pectoris: Secondary | ICD-10-CM | POA: Diagnosis not present

## 2020-06-14 DIAGNOSIS — K0500 Acute gingivitis, plaque induced: Secondary | ICD-10-CM | POA: Diagnosis not present

## 2020-06-14 DIAGNOSIS — I10 Essential (primary) hypertension: Secondary | ICD-10-CM | POA: Diagnosis not present

## 2020-07-07 DIAGNOSIS — M1712 Unilateral primary osteoarthritis, left knee: Secondary | ICD-10-CM | POA: Diagnosis not present

## 2020-07-11 DIAGNOSIS — E039 Hypothyroidism, unspecified: Secondary | ICD-10-CM | POA: Diagnosis not present

## 2020-07-18 ENCOUNTER — Other Ambulatory Visit: Payer: Self-pay

## 2020-07-18 ENCOUNTER — Ambulatory Visit (INDEPENDENT_AMBULATORY_CARE_PROVIDER_SITE_OTHER): Payer: Medicare Other | Admitting: Gastroenterology

## 2020-07-18 ENCOUNTER — Other Ambulatory Visit (INDEPENDENT_AMBULATORY_CARE_PROVIDER_SITE_OTHER): Payer: Medicare Other

## 2020-07-18 ENCOUNTER — Encounter: Payer: Self-pay | Admitting: Gastroenterology

## 2020-07-18 VITALS — BP 134/84 | HR 82 | Ht 61.0 in | Wt 180.1 lb

## 2020-07-18 DIAGNOSIS — R1033 Periumbilical pain: Secondary | ICD-10-CM

## 2020-07-18 LAB — BASIC METABOLIC PANEL
BUN: 14 mg/dL (ref 6–23)
CO2: 29 mEq/L (ref 19–32)
Calcium: 9.7 mg/dL (ref 8.4–10.5)
Chloride: 102 mEq/L (ref 96–112)
Creatinine, Ser: 1.1 mg/dL (ref 0.40–1.20)
GFR: 52 mL/min — ABNORMAL LOW (ref >60.00)
Glucose, Bld: 89 mg/dL (ref 70–99)
Potassium: 4.3 mEq/L (ref 3.5–5.1)
Sodium: 137 mEq/L (ref 135–145)

## 2020-07-18 NOTE — Progress Notes (Signed)
Review of gastrointestinal problems:  1. Dysphasia, nausea 2009.EGD may 2009 was normal. Symptoms thought to be GERD related. Symptoms improved with addition of H2 blocker (March 2011). Barium esophagram 2011 was normal.EGD August 2020 for odynophagia showed mild nonspecific gastritis, pathology showed no H. pylori. 2. routine risk for colon cancer,colonoscopy may 2009: hemorrhoids only. Next colonoscopy may 2019; Intermittent IBS-like discomforts that are helped with antispasmodics usually. Repeated colonoscopy 07/2013 for some bowel habit changes, it was completely normal. Recommended recall colonoscopy for cancer screening 07/2023.Repeat colonoscopy August 2020 due to change in bowel habits showed left-sided diverticulosis, a single subcentimeter hyperplastic polyp was removed. 3. post ERCP pancreatitis, 2000  4. laparoscopic cholecystectomy,2000  5. GERD: EGD 12/12 with [employer] pH test 48 hours, done 05/02/11 while still on BID PPI (omeprazole) and bedtime H2 blocker was + for continued pathologic acid exposure. 2/13 felt much better on bid nexium than other PPI and nightly H2 blocker 6. Elevated liver tests, likely due to statin toxicity: July 2021 abdominal pain episode evaluated in the emergency room, ultrasound showed status post cholecystectomy, bile duct 4.5 mm. AST 964, ALT 379, alk phos 290, total bilirubin 2.3 acute viral hepatitis panel was negative: Battery of blood tests was normal except for ANA positive at 1-80 titer, anti-smooth muscle antibody slightly elevated as well. SPEP/Gollan is well. 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 She stopped it and her LFTs resolved to normal after about 2 months.  She was advised to not restart statin medicines ever   HPI: This is a very pleasant  70 year old woman  Her weight is down 1 pound since her last office visit here about 4 months ago.  She was last here in our office 4 months ago.  Her LFTs have completely normalized.  She is not taking her proton pump inhibitor the correct time relation to meals and so I educated her about that.  She also switched her H2 blocker to bedtime dosing.  LFTs January 2022 were completely normal  Today she tells me her heartburn is quite a bit better since she started taking famotidine 20 mg pills at bedtime every night.  She had ten teeth removed on Friday for inflammation in her mouth.  She is periodically very bothered by mid abdominal pains.  These seem to disrupt her bowel habits as well.  Even taking extra MiraLAX will not open things up.  These pains can last for days.  She calls them a boring type pain   ROS: complete GI ROS as described in HPI, all other review negative.  Constitutional:  No unintentional weight loss   Past Medical History:  Diagnosis Date  . Allergic rhinitis       . Arthritis   . Asthma       . Chest pressure    a. Normal Stress Echo 05/2010  . Complication of anesthesia   . GERD (gastroesophageal reflux disease)   . Hyperlipidemia   . Hypertension   . Hypothyroidism   . Irritable bowel syndrome   . Pancreatitis    after ERCP  . Pneumonia   . PONV (postoperative nausea and vomiting)     Past Surgical History:  Procedure Laterality Date  . BRAVO [place] STUDY  05/02/2011   Procedure: BRAVO [place] STUDY;  Surgeon: Owens Loffler, MD;  Location: [facility];  Service: Endoscopy;  Laterality:  N/A;  48 hour wirless pH test (Bravo Test)  . CHOLECYSTECTOMY    . ESOPHAGOGASTRODUODENOSCOPY  05/02/2011   Procedure: ESOPHAGOGASTRODUODENOSCOPY (EGD);  Surgeon: Owens Loffler, MD;  Location: [facility];  Service: Endoscopy;  Laterality: N/A;  . HERNIA REPAIR    . LEFT HEART CATH AND CORONARY ANGIOGRAPHY N/A 06/26/2017   Procedure: LEFT HEART CATH AND CORONARY  ANGIOGRAPHY;  Surgeon: Belva Crome, MD;  Location: [place] [facility];  Service: Cardiovascular;  Laterality: N/A;  . MENISCUS REPAIR Left 03/18/2016   and repair of cracked bone  . SALPINGOOPHORECTOMY    . TOTAL ABDOMINAL HYSTERECTOMY    . TOTAL KNEE ARTHROPLASTY Left 07/16/2019   Procedure: TOTAL KNEE ARTHROPLASTY;  Surgeon: Sydnee Cabal, MD;  Location: [facility];  Service: Orthopedics;  Laterality: Left;    Current Outpatient Medications  Medication Sig Dispense Refill  . acetaminophen (TYLENOL) 500 MG tablet Take 1,000 mg by mouth every 6 (six) hours as needed for mild pain.     Marland Kitchen albuterol (PROVENTIL HFA) 108 (90 Base) MCG/ACT inhaler INHALE 2 PUFFS INTO THE LUNGS EVERY 6 (SIX) HOURS AS NEEDED. 1 Inhaler 3  . aspirin 81 MG EC tablet Take 81 mg by mouth daily.    . budesonide-formoterol (SYMBICORT) 80-4.5 MCG/ACT inhaler Inhale 2 puffs into the lungs 2 (two) times daily. 1 Inhaler 3  . cetirizine (ZYRTEC) 10 MG tablet Take 10 mg by mouth daily as needed.     Marland Kitchen esomeprazole (NEXIUM) 40 MG capsule Take one capsule each morning 30-60 minutes prior to breakfast each day. 30 capsule 11  . fish oil-omega-3 fatty acids 1000 MG capsule Take 1 g by mouth daily.     . fluticasone (FLONASE) 50 MCG/ACT nasal spray Place 2 sprays into both nostrils daily. 16 g 2  . levothyroxine (SYNTHROID) 75 MCG tablet Take 75 mcg by mouth daily.    . metoprolol succinate (TOPROL-XL) 25 MG 24 hr tablet TAKE 1 TABLET BY MOUTH EVERY DAY 90 tablet 3  . Multiple Vitamin (MULITIVITAMIN WITH MINERALS) TABS Take 1 tablet by mouth daily.      No current facility-administered medications for this visit.    Allergies as of 07/18/2020 - Review Complete 07/18/2020  Allergen Reaction Noted  . Penicillins Rash 11/24/2014  . Atorvastatin Other (See Comments) 06/14/2020    Family History  Problem Relation Age of Onset  . Stroke Mother   . Stroke Father   . Allergies Brother   . Allergies Sister   . Colon polyps  Sister   . Heart attack Brother   . Heart attack Brother   . Allergies Son   . Colon cancer Neg Hx   . Stomach cancer Neg Hx   . Esophageal cancer Neg Hx   . Lung disease Neg Hx   . Cancer Neg Hx   . Rectal cancer Neg Hx     Social History   Socioeconomic History  . Marital status: Married    Spouse name: Not on file  . Number of children: 2  . Years of education: Not on file  . Highest education level: Not on file  Occupational History  . Occupation: retired    Fish farm manager: [employer]  Tobacco Use  . Smoking status: Never Smoker  . Smokeless tobacco: Never Used  Vaping Use  . Vaping Use: Never used  Substance and Sexual Activity  . Alcohol use: No  . Drug use: No  . Sexual activity: Not on file  Other Topics Concern  .  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   Currently retired as of 07/2012      [facility] (11/26/16):   Originally from [place] [facility]. Has always lived in [place]. She retired from [employer]. 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 No pets currently. No bird exposure. No mold or hot tub exposure. Does have carpet in her bedroom. No feather bedding. She has blinds & draperies. No indoor plants. Enjoys helping out in her church and with nonprofit work. Enjoys going to the beach.    Social Determinants of Health   Financial Resource Strain: Not on file  Food Insecurity: Not on file  Transportation Needs: Not on file  Physical Activity: Not on file  Stress: Not on file  Social Connections: Not on file  Intimate Partner Violence: Not on file     Physical Exam: BP 134/84 (BP Location: Left Arm, Patient Position: Sitting, Cuff Size: Normal)   Pulse 82   Ht 5' 1" (1.549 m)   Wt 180 lb 2 oz (81.7 kg)   BMI 34.03 kg/m  Constitutional: generally well-appearing Psychiatric: alert and oriented x3 Abdomen: soft, nontender, nondistended, no obvious ascites, no  peritoneal signs, normal bowel sounds No peripheral edema noted in lower extremities  Assessment and plan: 70 y.o. female with improved GERD, resolved drug-induced hepatitis, mid abdominal pain  Her mid abdominal pain is certainly possibly related to periodic constipation that she suffers from.  I recommended CT scan to exclude other potential causes such as neoplasm which I think is unlikely.  I see no reason for any further blood tests or imaging studies prior to then.  She will stay on her proton pump inhibitor before her lunch meal and famotidine at bedtime as this definitely seems to be helping her chronic GERD symptoms.  No further testing is needed for her drug-induced hepatitis, her liver tests have completely normalized.  She knows she should never take statin medicines again.  Please see the "Patient Instructions" section for addition details about the plan.  Owens Loffler, MD [facility] 07/18/2020, 2:26 PM   Total time on date of encounter was 30 minutes (this included time spent preparing to see the patient reviewing records; obtaining and/or reviewing separately obtained history; performing a medically appropriate exam and/or evaluation; counseling and educating the patient and family if present; ordering medications, tests or procedures if applicable; and documenting clinical information in the health record).

## 2020-07-18 NOTE — Patient Instructions (Addendum)
If you are age 68 or older, your body mass index should be between 23-30. Your Body mass index is 34.03 kg/m. If this is out of the aforementioned range listed, please consider follow up with your Primary Care Provider.  You will be contacted by [place] in the next 2 days to arrange a CT abdomen/pelvis .  The number on your caller ID will be [phone], please answer when they call.  If you have not heard from them in 2 days please call [phone] to schedule.    Your provider has requested that you go to the basement level for lab work before leaving today. Press "B" on the elevator. The lab is located at the first door on the left as you exit the elevator.  Due to recent changes in healthcare laws, you may see the results of your imaging and laboratory studies on MyChart before your provider has had a chance to review them.  We understand that in some cases there may be results that are confusing or concerning to you. Not all laboratory results come back in the same time frame and the provider may be waiting for multiple results in order to interpret others.  Please give [place] 48 hours in order for your provider to thoroughly review all the results before contacting the office for clarification of your results.   Thank you for entrusting me with your care and choosing [facility].  Dr Ardis Hughs

## 2020-07-19 ENCOUNTER — Ambulatory Visit: Payer: Medicare Other | Admitting: Cardiology

## 2020-07-21 ENCOUNTER — Other Ambulatory Visit: Payer: Self-pay

## 2020-07-21 ENCOUNTER — Encounter: Payer: Self-pay | Admitting: Cardiology

## 2020-07-21 ENCOUNTER — Ambulatory Visit (INDEPENDENT_AMBULATORY_CARE_PROVIDER_SITE_OTHER): Payer: Medicare Other | Admitting: Cardiology

## 2020-07-21 VITALS — BP 114/80 | HR 70 | Ht 61.0 in | Wt 180.0 lb

## 2020-07-21 DIAGNOSIS — E785 Hyperlipidemia, unspecified: Secondary | ICD-10-CM

## 2020-07-21 DIAGNOSIS — I2510 Atherosclerotic heart disease of native coronary artery without angina pectoris: Secondary | ICD-10-CM | POA: Diagnosis not present

## 2020-07-21 NOTE — Progress Notes (Signed)
Cardiology Office Note:    Date:  07/21/2020   ID:  Dominique Mooney, DOB 03-30-1951, MRN [id number]  PCP:  Merrilee Seashore, MD   [place]  Cardiologist:  Candee Furbish, MD  Advanced Practice Provider:  No care team member to display Electrophysiologist:  None       Referring MD: Merrilee Seashore, MD     History of Present Illness:    Dominique Mooney is a 70 y.o. female here for follow-up CAD, 2019 cath showed nonobstructive CAD.  Hyperlipidemia LFTs elevated.  Dr. Ardis Hughs biopsy performed.  LFTs improved after stopping atorvastatin.  She is not able to take a statin ever again.  Overall feeling well without any fevers chills nausea vomiting syncope bleeding.  Alzheimer's disease in her family.  She asked about prevention.  Past Medical History:  Diagnosis Date  . Allergic rhinitis       . Arthritis   . Asthma       . Chest pressure    a. Normal Stress Echo 05/2010  . Complication of anesthesia   . GERD (gastroesophageal reflux disease)   . Hyperlipidemia   . Hypertension   . Hypothyroidism   . Irritable bowel syndrome   . Pancreatitis    after ERCP  . Pneumonia   . PONV (postoperative nausea and vomiting)     Past Surgical History:  Procedure Laterality Date  . BRAVO [place] STUDY  05/02/2011   Procedure: BRAVO [place] STUDY;  Surgeon: Owens Loffler, MD;  [facility];  Service: Endoscopy;  Laterality: N/A;  48 hour wirless pH test (Bravo Test)  . CHOLECYSTECTOMY    . ESOPHAGOGASTRODUODENOSCOPY  05/02/2011   Procedure: ESOPHAGOGASTRODUODENOSCOPY (EGD);  Surgeon: Owens Loffler, MD;  Location: [facility];  Service: Endoscopy;  Laterality: N/A;  . HERNIA REPAIR    . LEFT HEART CATH AND CORONARY ANGIOGRAPHY N/A 06/26/2017   Procedure: LEFT HEART CATH AND CORONARY ANGIOGRAPHY;  Surgeon: Belva Crome, MD;  Location: [place] [facility];  Service: Cardiovascular;  Laterality: N/A;  . MENISCUS REPAIR Left 03/18/2016   and repair of cracked  bone  . SALPINGOOPHORECTOMY    . TOTAL ABDOMINAL HYSTERECTOMY    . TOTAL KNEE ARTHROPLASTY Left 07/16/2019   Procedure: TOTAL KNEE ARTHROPLASTY;  Surgeon: Sydnee Cabal, MD;  [facility];  Service: Orthopedics;  Laterality: Left;    Current Medications: Current Meds  Medication Sig  . acetaminophen (TYLENOL) 500 MG tablet Take 1,000 mg by mouth every 6 (six) hours as needed for mild pain.   Marland Kitchen albuterol (PROVENTIL HFA) 108 (90 Base) MCG/ACT inhaler INHALE 2 PUFFS INTO THE LUNGS EVERY 6 (SIX) HOURS AS NEEDED.  Marland Kitchen aspirin 81 MG EC tablet Take 81 mg by mouth daily.  . budesonide-formoterol (SYMBICORT) 80-4.5 MCG/ACT inhaler Inhale 2 puffs into the lungs 2 (two) times daily.  . cetirizine (ZYRTEC) 10 MG tablet Take 10 mg by mouth daily as needed.   Marland Kitchen esomeprazole (NEXIUM) 40 MG capsule Take one capsule each morning 30-60 minutes prior to breakfast each day.  . fish oil-omega-3 fatty acids 1000 MG capsule Take 1 g by mouth daily.   . fluticasone (FLONASE) 50 MCG/ACT nasal spray Place 2 sprays into both nostrils daily.  Marland Kitchen levothyroxine (SYNTHROID) 75 MCG tablet Take 75 mcg by mouth daily.  . metoprolol succinate (TOPROL-XL) 25 MG 24 hr tablet TAKE 1 TABLET BY MOUTH EVERY DAY  . Multiple Vitamin (MULITIVITAMIN WITH MINERALS) TABS Take 1 tablet by mouth daily.  Allergies:   Statins, Penicillins, and Atorvastatin   Social History   Socioeconomic History  . Marital status: Married    Spouse name: Not on file  . Number of children: 2  . Years of education: Not on file  . Highest education level: Not on file  Occupational History  . Occupation: retired    Fish farm manager: [employer]  Tobacco Use  . Smoking status: Never Smoker  . Smokeless tobacco: Never Used  Vaping Use  . Vaping Use: Never used  Substance and Sexual Activity  . Alcohol use: No  . Drug use: No  . Sexual activity: Not on file  Other Topics Concern  . 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   Currently retired as of 07/2012      [facility] (11/26/16):   Originally from [place] [place]. Has always lived in [place]. She retired from [employer]. 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 No pets currently. No bird exposure. No mold or hot tub exposure. Does have carpet in her bedroom. No feather bedding. She has blinds & draperies. No indoor plants. Enjoys helping out in her church and with nonprofit work. Enjoys going to the beach.    Social Determinants of Health   Financial Resource Strain: Not on file  Food Insecurity: Not on file  Transportation Needs: Not on file  Physical Activity: Not on file  Stress: Not on file  Social Connections: Not on file     Family History: The patient's family history includes Allergies in her brother, sister, and son; Colon polyps in her sister; Heart attack in her brother and brother; Stroke in her father and mother. There is no history of Colon cancer, Stomach cancer, Esophageal cancer, Lung disease, Cancer, or Rectal cancer.  ROS:   Please see the history of present illness.     All other systems reviewed and are negative.  EKGs/Labs/Other Studies Reviewed:    The following studies were reviewed today:  EKG:  EKG is  ordered today.  The ekg ordered today demonstrates sinus rhythm 70 nonspecific ST-T wave changes  Recent Labs: 12/08/2019: TSH 8.20 12/31/2019: Hemoglobin 12.5; Platelets 224 05/23/2020: ALT 6 07/18/2020: BUN 14; Creatinine, Ser 1.10; Potassium 4.3; Sodium 137  Recent Lipid Panel    Component Value Date/Time   CHOL 184 01/14/2020 0940   TRIG 139 01/14/2020 0940   HDL 43 01/14/2020 0940   CHOLHDL 4.3 01/14/2020 0940   LDLCALC 116 (H) 01/14/2020 0940     Risk Assessment/Calculations:      Physical Exam:    VS:  BP 114/80 (BP Location: Left Arm, Patient Position: Sitting, Cuff Size: Normal)   Pulse 70   Ht 5\' 1"  (1.549 m)   Wt 180 lb (81.6 kg)    SpO2 97%   BMI 34.01 kg/m     Wt Readings from Last 3 Encounters:  07/21/20 180 lb (81.6 kg)  07/18/20 180 lb 2 oz (81.7 kg)  03/13/20 181 lb (82.1 kg)     GEN:  Well nourished, well developed in no acute distress HEENT: Normal NECK: No JVD; No carotid bruits LYMPHATICS: No lymphadenopathy CARDIAC: RRR, no murmurs, rubs, gallops RESPIRATORY:  Clear to auscultation without rales, wheezing or rhonchi  ABDOMEN: Soft, non-tender, non-distended MUSCULOSKELETAL:  No edema; No deformity  SKIN: Warm and dry NEUROLOGIC:  Alert and oriented x 3 PSYCHIATRIC:  Normal affect   ASSESSMENT:  1. Coronary artery disease involving native coronary artery of native heart without angina pectoris   2. Hyperlipidemia, unspecified hyperlipidemia type    PLAN:    In order of problems listed above:  Coronary artery disease -Nonflow limiting.  FFR 0.74 and LAD distal distribution.  Cardiac catheterization showed a first diagonal 40% 30% distal LAD wrapping around the apex.  Small caliber.  Continue with goal-directed medical therapy.  Hyperlipidemia -Was on atorvastatin 40 increased to 80 but then her LFTs started to increase significantly.  Dr. Ardis Hughs with GI saw liver biopsy done.  Biopsy reviewed.  She is off the statin doing better from an LFT standpoint.  According to Dr. Ardis Hughs, she is not able to use statins. -I will set her up with the lipid clinic here to talk about alternatives such as PCSK9 inhibitor that should not alter liver physiology. Last LDL 150 goal is less than 70.  Hepatitis -As described above LFTs went up.  Statin is now off.  Family history of Alzheimer's -Encouraged daily exercise, puzzles, diet.      Medication Adjustments/Labs and Tests Ordered: Current medicines are reviewed at length with the patient today.  Concerns regarding medicines are outlined above.  Orders Placed This Encounter  Procedures  . AMB Referral to [facility] Pharm-D  . EKG 12-Lead   No  orders of the defined types were placed in this encounter.   Patient Instructions  Medication Instructions:  Your physician recommends that you continue on your current medications as directed. Please refer to the Current Medication list given to you today. *If you need a refill on your cardiac medications before your next appointment, please call your pharmacy*   You have been referred to the Lipid clinic here on [place]: At [facility], you and your health needs are our priority.  As part of our continuing mission to provide you with exceptional heart care, we have created designated Provider Care Teams.  These Care Teams include your primary Cardiologist (physician) and Advanced Practice Providers (APPs -  Physician Assistants and Nurse Practitioners) who all work together to provide you with the care you need, when you need it.  We recommend signing up for the patient portal called "MyChart".  Sign up information is provided on this After Visit Summary.  MyChart is used to connect with patients for Virtual Visits (Telemedicine).  Patients are able to view lab/test results, encounter notes, upcoming appointments, etc.  Non-urgent messages can be sent to your provider as well.   To learn more about what you can do with MyChart, go to [email or web address].    Your next appointment:   6 month(s)  The format for your next appointment:   In Person  Provider:   You may see Candee Furbish, MD or one of the following Advanced Practice Providers on your designated Care Team:    Kathyrn Drown, NP        Signed, Candee Furbish, MD  07/21/2020 10:33 AM    [place]

## 2020-07-21 NOTE — Patient Instructions (Signed)
Medication Instructions:  Your physician recommends that you continue on your current medications as directed. Please refer to the Current Medication list given to you today. *If you need a refill on your cardiac medications before your next appointment, please call your pharmacy*   You have been referred to the Lipid clinic here on [place]: At [facility], you and your health needs are our priority.  As part of our continuing mission to provide you with exceptional heart care, we have created designated Provider Care Teams.  These Care Teams include your primary Cardiologist (physician) and Advanced Practice Providers (APPs -  Physician Assistants and Nurse Practitioners) who all work together to provide you with the care you need, when you need it.  We recommend signing up for the patient portal called "MyChart".  Sign up information is provided on this After Visit Summary.  MyChart is used to connect with patients for Virtual Visits (Telemedicine).  Patients are able to view lab/test results, encounter notes, upcoming appointments, etc.  Non-urgent messages can be sent to your provider as well.   To learn more about what you can do with MyChart, go to [email or web address].    Your next appointment:   6 month(s)  The format for your next appointment:   In Person  Provider:   You may see Candee Furbish, MD or one of the following Advanced Practice Providers on your designated Care Team:    Kathyrn Drown, NP

## 2020-07-26 NOTE — Addendum Note (Signed)
Addended by: Stevan Born on: 07/26/2020 01:42 PM   Modules accepted: Orders

## 2020-07-31 ENCOUNTER — Ambulatory Visit (INDEPENDENT_AMBULATORY_CARE_PROVIDER_SITE_OTHER)
Admission: RE | Admit: 2020-07-31 | Discharge: 2020-07-31 | Disposition: A | Discharge status: Home / Self Care | Payer: Medicare Other | Source: Ambulatory Visit | Attending: Gastroenterology | Admitting: Gastroenterology

## 2020-07-31 ENCOUNTER — Other Ambulatory Visit: Payer: Self-pay

## 2020-07-31 DIAGNOSIS — K760 Fatty (change of) liver, not elsewhere classified: Secondary | ICD-10-CM | POA: Diagnosis not present

## 2020-07-31 DIAGNOSIS — R109 Unspecified abdominal pain: Secondary | ICD-10-CM | POA: Diagnosis not present

## 2020-07-31 DIAGNOSIS — R1033 Periumbilical pain: Secondary | ICD-10-CM

## 2020-07-31 MED ORDER — IOHEXOL 300 MG/ML  SOLN
100.0000 mL | Freq: Once | INTRAMUSCULAR | Status: AC | PRN
  Administered 2020-07-31: 100 mL via INTRAVENOUS

## 2020-07-31 NOTE — Progress Notes (Signed)
Patient ID: Dominique Mooney                 DOB: 1951-02-20                    MRN: [id number]     HPI: Dominique Mooney is a 70 y.o. female patient referred to lipid clinic by Dr. Marlou Porch. 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 In July 2021, pt had abdominal pain episode evaluated in the ED: AST 964, ALT 379, alk phos 290, total bilirubin 2.3 acute viral hepatitis panel was negative. 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 LFTs resolved to normal 2 months after stopping statin.   Last seen by Dr. Marlou Porch 07/21/20. Per Dr. Ardis Hughs (GI), pt is not able to use statins ever again. Referred to lipid clinic to discuss alternative LDL lowering therapy such as PCSK9 inhibitor that should not alter liver physiology.   Patient presents today to the lipid clinic. She is a very sweet lady. States she is feeling well. Exercises 5 days a week at the [facility] for 1.5 hours. Has been trying hard to improve her diet. Doesn't drink soda anymore. Stopped eating bread. Does not fry her food. Cooks chicken and fish in air fryer.   Current Medications: Omega-3 fatty acids 1g daily (OTC) Intolerances: Atorvastatin 80 mg daily (elevated LFTs) Risk Factors: CAD, HLD, HTN, family hx LDL goal: <70 mg/dL  Diet: breakfast: cereal or oatmeal w/ granola Lunch: protein bar or yogurt  Dinner: fish or chicken in air fryer or baked vegetables (greens or salads), sweet potato  Snack: fruit cup, pop corn Drink: cranberry juice, orange juice, grape juice, water  Exercise: walks 1.5 miles per day and then works on machines (elipical and treadmill)  at the [employer]- 5 days a week (total workout 1.5 hr)  Family History: Heart attack in her brother and brother; Stroke in her father and mother  Social History: Never smoker, no alcohol use  Labs: 05/23/20: AST 21, ALT  6 03/13/20: TC 230, HDL 60, TG 111, LDL 150 01/14/20: TC 184, HDL 60, TG 86, LDL 116  Past Medical History:  Diagnosis Date  . Allergic rhinitis       . Arthritis   . Asthma       . Chest pressure    a. Normal Stress Echo 05/2010  . Complication of anesthesia   . GERD (gastroesophageal reflux disease)   . Hyperlipidemia   . Hypertension   . Hypothyroidism   . Irritable bowel syndrome   . Pancreatitis    after ERCP  . Pneumonia   . PONV (postoperative nausea and vomiting)     Current Outpatient Medications on File Prior to Visit  Medication Sig Dispense Refill  . acetaminophen (TYLENOL) 500 MG tablet Take 1,000 mg by mouth every 6 (six) hours as needed for mild pain.     Marland Kitchen albuterol (PROVENTIL HFA) 108 (90 Base) MCG/ACT inhaler INHALE 2 PUFFS INTO THE LUNGS EVERY 6 (SIX) HOURS AS NEEDED. 1 Inhaler 3  . aspirin 81 MG EC tablet Take 81 mg by mouth daily.    . budesonide-formoterol (SYMBICORT) 80-4.5 MCG/ACT inhaler Inhale 2 puffs into the lungs 2 (two) times daily. 1 Inhaler 3  . cetirizine (ZYRTEC) 10 MG tablet Take 10 mg by mouth daily as needed.     Marland Kitchen esomeprazole (NEXIUM) 40 MG capsule  Take one capsule each morning 30-60 minutes prior to breakfast each day. 30 capsule 11  . fish oil-omega-3 fatty acids 1000 MG capsule Take 1 g by mouth daily.     . fluticasone (FLONASE) 50 MCG/ACT nasal spray Place 2 sprays into both nostrils daily. 16 g 2  . levothyroxine (SYNTHROID) 75 MCG tablet Take 75 mcg by mouth daily.    . metoprolol succinate (TOPROL-XL) 25 MG 24 hr tablet TAKE 1 TABLET BY MOUTH EVERY DAY 90 tablet 3  . Multiple Vitamin (MULITIVITAMIN WITH MINERALS) TABS Take 1 tablet by mouth daily.      No current facility-administered medications on file prior to visit.    Allergies  Allergen Reactions  . Statins Other (See Comments)    Elevated Liver functions  . Penicillins Rash  . Atorvastatin Other (See Comments)    Abdominal bloating    Assessment/Plan:  1.  Hyperlipidemia - LDL elevated above goal <70 mg/dL. Patient is unable to take statins due to history of drug-induced hepatitis. Will submit a prior authorization for Repatha as it has no reports of liver enzyme issues. I will check with Dr. Ardis Hughs to make sure he is ok with [place] using Repatha. Reviewed with patient cost, injection technique, efficacy and side effects. Also discussed apply for [place] to help with cost. Diet reviewed and some tips on avoiding extra sugar provided. Continue exercise.   Thank you,  Ramond Dial, Pharm.D, BCPS, CPP [place]  [address], [place], [place] [address]  Phone: [phone]; Fax: [phone]

## 2020-08-01 ENCOUNTER — Ambulatory Visit (INDEPENDENT_AMBULATORY_CARE_PROVIDER_SITE_OTHER): Payer: Medicare Other | Admitting: Pharmacist

## 2020-08-01 DIAGNOSIS — K719 Toxic liver disease, unspecified: Secondary | ICD-10-CM

## 2020-08-01 DIAGNOSIS — E785 Hyperlipidemia, unspecified: Secondary | ICD-10-CM | POA: Diagnosis not present

## 2020-08-01 DIAGNOSIS — T466X5A Adverse effect of antihyperlipidemic and antiarteriosclerotic drugs, initial encounter: Secondary | ICD-10-CM

## 2020-08-01 NOTE — Patient Instructions (Addendum)
It was a pleasure to meet you!  I will submit a prior authorization for Repatha and will call you once I have a determination  Call me at [phone] with any questions  TIPS for Living a healthier life  SUGAR  Sugar is a huge problem in the modern day diet. Sugar is a HUGE contributor to heart disease, diabetes, high triglyceride levels, fatty liver diease and obesity. Sugar is hidden in almost all packaged foods/beverages. Added sugar is extra sugar that is added beyond what is naturally found. It adds no nutritional benefit to your body and can cause major harm. The American Heart Association recommends limiting added sugars to no more than 25g for women and 36 grams for men per day.  There are many names for sugar maltose, sucrose (names ending in "ose"), high fructose corn syrup, molasses, cane sugar, corn sweetener, raw sugar, syrup, honey or fruit juice concentrate.   One of the best ways to limit your added sugars is to stop drinking sweetened beverages such as soda, sweet tea, fruit juice or fancy coffee's. There is 65g of added sugars in one 20oz bottle of Coke!! That is equal to 6 donuts.   Pay attention and read all nutrition facts labels. Below is an examples of a nutrition facts label. The #1 is showing you the total sugars where the # 2 is showing you the added sugars. This one severing has almost the max amount of added sugars per day!  Watch out for items that say "low fat" or "no added sugar" as these products are typically very high in sugar. The food industry uses these terms to fool you into thinking they are healthy.  For more information on the dangers of sugar watch WHY Sugar is as Bad as Alcohol (Fructose, The Liver Toxin) on YouTube.    EXERCISE  Exercise is good. We've all heard that. In an ideal world, we would all have time and resources to  get plenty of it. When you are active your heart pumps more efficiently and you will feel better.  Multiple studies  show that even walking regularly has benefits that include living a longer life.  The American Heart Association recommends 90-150 minutes per week of exercise (30 minutes  per day most days of the week). You can do this in any increment you wish. Nine or more  10-minute walks count. So does an hour-long exercise class. Break the time apart into what will  work in your life. Some of the best things you can do include walking briskly, jogging, cycling or  swimming laps. Not everyone is ready to "exercise." Sometimes we need to start with just getting active. Here  are some easy ways to be more active throughout the day: Marland Kitchen Take the stairs instead of the elevator . Go for a 10-15 minute walk during your lunch break (find a friend to make it more enjoyable) . When shopping, park at the back of the parking lot . If you take public transportation, get off one stop early and walk the extra distance . Pace around while making phone calls (most of [place] are not attached to phone cords any longer!) Check with your doctor if you aren't sure what your limitations may be. Always remember to drink plenty of water when doing any type of exercise. Don't feel like a failure if you're not getting the 90-150 minutes per week. If you started by being  a couch potato, then just a 10-minute walk each day  is a huge improvement. Start with little  victories and work your way up.   Healthy Eating Tips  When looking to improve your eating habits, whether to lose weight, lower blood pressure or just be healthier, it helps to know what a serving size is.   Grains 1 slice of bread,  bagel,  cup pasta or rice  Vegetables 1 cup fresh or raw vegetables,  cup cooked or canned Fruits 1 piece of medium sized fruit,  cup canned,   Meats/Proteins  cup dried       1 oz meat, 1 egg,  cup cooked beans, nuts or seeds  Dairy        Fats Individual yogurt container, 1 cup (8oz)    1 teaspoon margarine/butter or vegetable   milk or milk alternative, 1 slice of cheese          oil; 1 tablespoon mayonnaise or salad dressing                  Plan ahead: make a menu of the meals for a week then create a grocery list to go with  that menu. Consider meals that easily stretch into a night of leftovers, such as stews or  casseroles. Or consider making two of your favorite meal and put one in the freezer or fridge for  another night.  When you get home from the grocery store wash and prepare your vegetables and fruits.  Then when you need them they are ready to go.  Tips for going to the grocery store: . Buy store or generic brands . Check the weekly ad from your store on-line or in their in-store flyer . Look at the unit price on the shelf tag to compare/contrast the costs of different items . Buy fruits/vegetables in season . Carrots, bananas and apples are low-cost, naturally healthy items . If meats or frozen vegetables are on sale, buy some extras and put in your freezer . Limit buying prepared or "ready to eat" items, even if they are pre-made salads or fruit snacks . Do not shop when you're hungry . Foods at eye level tend to be more expensive. Look on the high and low shelves for deals. . Consider shopping at the farmer's market for fresh foods in season. . Choose canned tuna or salmon instead of fresh . Avoid the cookie and chip aisles (these are expensive, high in calories and low in  nutritional value). Shop on the outside of the grocery store.  Aim to have one 12 hour fast each day. This means no eating after dinner until breakfast. For example, if you eat dinner around 6 PM then you would not eat anything until 6 AM the next day. This is a great way to help lower your insulin levels, lose weight and reduce your blood pressure.   Healthy food preparations: . If you can't get lean hamburger, be sure to drain the fat when cooking . Steam, saut (in olive oil), grill or bake foods . Experiment with  different seasonings to avoid adding salt to your foods. Kosher salt, sea salt and Himalayan salt are all still salt and should be avoided Try seasoning food with onion, garlic, thyme, rosemary, basil ect. Onion powder or garlic powder is ok. Avoid if it says salt (ie garlic salt).        Resources: American Heart Association - InstantFinish.fi Go to the Healthy Living tab to get more information American Diabetes Association - www.diabetes.org You don't have  to be diabetic - check out the Food and Fitness tab

## 2020-08-02 DIAGNOSIS — G43009 Migraine without aura, not intractable, without status migrainosus: Secondary | ICD-10-CM | POA: Diagnosis not present

## 2020-08-07 ENCOUNTER — Telehealth: Payer: Self-pay | Admitting: Pharmacist

## 2020-08-07 DIAGNOSIS — E785 Hyperlipidemia, unspecified: Secondary | ICD-10-CM

## 2020-08-07 MED ORDER — REPATHA SURECLICK 140 MG/ML ~~LOC~~ SOAJ: 1.0000 "pen " | SUBCUTANEOUS | 11 refills | Status: DC

## 2020-08-07 NOTE — Telephone Encounter (Signed)
Reached out to Dr.Jacobs who agreed it would be safe to try Repatha. Repatha PA approved through 02/01/21.   Dominique Mooney approved  Pharmacy Card Id [id number] Group [id number] PCN [id number] BIN [id number]  Called into [facility]- copay now $0  LVM for pt to call back

## 2020-08-08 ENCOUNTER — Telehealth: Payer: Self-pay | Admitting: Gastroenterology

## 2020-08-08 NOTE — Telephone Encounter (Signed)
Sorry her BMET was normal.  I think it was just done for her upcoming CT scan.

## 2020-08-08 NOTE — Addendum Note (Signed)
Addended by: Marcelle Overlie D on: 08/08/2020 04:53 PM   Modules accepted: Orders

## 2020-08-08 NOTE — Telephone Encounter (Signed)
The pt has been advised of the lab results.

## 2020-08-08 NOTE — Telephone Encounter (Signed)
Patient called to request lab results.

## 2020-08-08 NOTE — Telephone Encounter (Addendum)
Spoke to patient who was able to pick up medication from pharmacy and has given her first shot.  Labs scheduled for 5/17

## 2020-08-08 NOTE — Telephone Encounter (Signed)
Dr Ardis Hughs have you reviewed labs from 3/1?

## 2020-08-10 DIAGNOSIS — H04123 Dry eye syndrome of bilateral lacrimal glands: Secondary | ICD-10-CM | POA: Diagnosis not present

## 2020-08-10 DIAGNOSIS — H40012 Open angle with borderline findings, low risk, left eye: Secondary | ICD-10-CM | POA: Diagnosis not present

## 2020-08-10 DIAGNOSIS — H401411 Capsular glaucoma with pseudoexfoliation of lens, right eye, mild stage: Secondary | ICD-10-CM | POA: Diagnosis not present

## 2020-08-10 DIAGNOSIS — H35373 Puckering of macula, bilateral: Secondary | ICD-10-CM | POA: Diagnosis not present

## 2020-08-10 DIAGNOSIS — H524 Presbyopia: Secondary | ICD-10-CM | POA: Diagnosis not present

## 2020-08-30 DIAGNOSIS — E039 Hypothyroidism, unspecified: Secondary | ICD-10-CM | POA: Diagnosis not present

## 2020-09-19 DIAGNOSIS — E039 Hypothyroidism, unspecified: Secondary | ICD-10-CM | POA: Diagnosis not present

## 2020-09-19 DIAGNOSIS — E559 Vitamin D deficiency, unspecified: Secondary | ICD-10-CM | POA: Diagnosis not present

## 2020-09-19 DIAGNOSIS — E119 Type 2 diabetes mellitus without complications: Secondary | ICD-10-CM | POA: Diagnosis not present

## 2020-09-19 DIAGNOSIS — K754 Autoimmune hepatitis: Secondary | ICD-10-CM | POA: Diagnosis not present

## 2020-09-19 DIAGNOSIS — I2510 Atherosclerotic heart disease of native coronary artery without angina pectoris: Secondary | ICD-10-CM | POA: Diagnosis not present

## 2020-09-19 DIAGNOSIS — Z0000 Encounter for general adult medical examination without abnormal findings: Secondary | ICD-10-CM | POA: Diagnosis not present

## 2020-09-22 DIAGNOSIS — E039 Hypothyroidism, unspecified: Secondary | ICD-10-CM | POA: Diagnosis not present

## 2020-09-28 ENCOUNTER — Telehealth: Payer: Self-pay | Admitting: Cardiology

## 2020-09-28 NOTE — Telephone Encounter (Signed)
Patient called to see if she needs to make her lab work closer to her appt date in [place] then instead of 5/17. Please advise

## 2020-09-28 NOTE — Telephone Encounter (Signed)
Lab is for f/u in [place].

## 2020-09-28 NOTE — Telephone Encounter (Signed)
Confirmed for patient that her apt in May is just for a lab. Patient states she will come on 5/17 for her lab

## 2020-10-03 ENCOUNTER — Other Ambulatory Visit: Payer: Medicare Other

## 2020-10-03 ENCOUNTER — Other Ambulatory Visit: Payer: Self-pay

## 2020-10-03 ENCOUNTER — Telehealth: Payer: Self-pay | Admitting: Pharmacist

## 2020-10-03 DIAGNOSIS — E785 Hyperlipidemia, unspecified: Secondary | ICD-10-CM

## 2020-10-03 LAB — HEPATIC FUNCTION PANEL
ALT: 6 IU/L (ref 0–32)
AST: 26 IU/L (ref 0–40)
Albumin: 4.2 g/dL (ref 3.8–4.8)
Alkaline Phosphatase: 129 IU/L — ABNORMAL HIGH (ref 44–121)
Bilirubin Total: 0.5 mg/dL (ref 0.0–1.2)
Bilirubin, Direct: 0.15 mg/dL (ref 0.00–0.40)
Total Protein: 7.4 g/dL (ref 6.0–8.5)

## 2020-10-03 LAB — LIPID PANEL
Chol/HDL Ratio: 3 ratio (ref 0.0–4.4)
Cholesterol, Total: 201 mg/dL — ABNORMAL HIGH (ref 100–199)
HDL: 66 mg/dL (ref >39)
LDL Chol Calc (NIH): 119 mg/dL — ABNORMAL HIGH (ref 0–99)
Triglycerides: 91 mg/dL (ref 0–149)
VLDL Cholesterol Cal: 16 mg/dL (ref 5–40)

## 2020-10-03 NOTE — Telephone Encounter (Signed)
LDL did no drop as much as expected. Baseline 150 down to 119. She has given 4 injections so far. Confirmed she is giving every 2 weeks. Will give 2 more injections of Repatha and recheck. If still not desired response, will try switching to praluent. Repeat labs 6/17

## 2020-10-24 ENCOUNTER — Telehealth: Payer: Self-pay | Admitting: Adult Health

## 2020-10-24 ENCOUNTER — Other Ambulatory Visit: Payer: Self-pay

## 2020-10-24 ENCOUNTER — Encounter: Payer: Self-pay | Admitting: Adult Health

## 2020-10-24 ENCOUNTER — Ambulatory Visit (INDEPENDENT_AMBULATORY_CARE_PROVIDER_SITE_OTHER): Payer: Medicare Other | Admitting: Adult Health

## 2020-10-24 DIAGNOSIS — J4530 Mild persistent asthma, uncomplicated: Secondary | ICD-10-CM | POA: Diagnosis not present

## 2020-10-24 DIAGNOSIS — J019 Acute sinusitis, unspecified: Secondary | ICD-10-CM

## 2020-10-24 MED ORDER — AZITHROMYCIN 250 MG PO TABS: ORAL_TABLET | ORAL | 0 refills | Status: AC

## 2020-10-24 NOTE — Assessment & Plan Note (Signed)
Appears controlled without exacerbation.  Patient is advised of asthma action plan.  Plan  Patient Instructions  Covid 19 test  Zpack take as directed.  Mucinex Twice daily  As needed  Cough/congestion  Continue on Symbicort 2 puffs Twice daily  , rinse after use.  Continue on Flonase 2 puff daily .  Continue on Zyrtec 10mg  At bedtime   Saline nasal spray As needed   Albuterol inhaler As needed   Follow up with Dr. Halford Chessman  Or Kikuye Korenek NP in 3-4 months and As needed   Please contact office for sooner follow up if symptoms do not improve or worsen or seek emergency care

## 2020-10-24 NOTE — Progress Notes (Signed)
@Patient  ID: Dominique Mooney, female    DOB: 10/12/1950, 70 y.o.   MRN: [id number]  Chief Complaint  Patient presents with  . Follow-up    Referring provider: Merrilee Seashore, MD  HPI: 70 year old female never smoker followed for mild persistent asthma and chronic allergic rhinitis.   TEST/EVENTS :  07/23/11: FVC 2.80 L (97%) FEV1 2.20 L (103%) FEV1/FVC 0.79 FEF 25-75 2.26 L (89%) negative bronchodilator response TLC 4.61 L (102%) RV 109% ERV 53% DLCO uncorrected 82%  CTA CHEST 02/01/13 (previouslyreviewed by me):No parenchymal nodule or opacity appreciated. Dependent lower lobe atelectasis noted. No pleural effusion or thickening. No pericardial effusion. No pathologic mediastinal adenopathy. No pulmonary embolism or aortic dissection.  LABS 11/26/16 IgE: 174 RAST panel: Cockroach 0.14, D pternoyssinus 0.33,&D farinae 0.28  PFT 2019  that shows normal lung function with FEV1 at 122%, ratio 87, FVC 109%, DLCO 98%. No significant bronchodilator response.  10/24/2020 Follow up : Ast[place] , [place]  Patient presents for an acute office visit.  Patient complains over the last 5 to 6 days that she has had sinus congestion, pressure, ear pain, sinus drainage.  Minimum cough.  No wheezing.  She remains on Symbicort twice daily.  Uses Flonase and Zyrtec most days.  She says she has been doing well up until this past week.  She has been fully vaccinated for COVID-19 including booster.  She denies any loss of taste or smell. Patient is fully retired now. She denies any chest pain orthopnea PND nausea vomiting or diarrhea.  Allergies  Allergen Reactions  . Statins Other (See Comments)    Elevated Liver functions  . Penicillins Rash  . Atorvastatin Other (See Comments)    Abdominal bloating    Immunization History  Administered Date(s) Administered  . Fluad Quad(high Dose 65+) 02/08/2019  . Influenza Split 01/19/2011, 07/21/2013, 02/27/2016  . Influenza Whole 01/18/2010, 01/19/2012   . Influenza,inj,Quad PF,6+ Mos 02/26/2017  . PPD Test 10/15/2014  . Pneumococcal Polysaccharide-23 03/03/2012    Past Medical History:  Diagnosis Date  . Allergic rhinitis       . Arthritis   . Asthma       . Chest pressure    a. Normal Stress Echo 05/2010  . Complication of anesthesia   . GERD (gastroesophageal reflux disease)   . Hyperlipidemia   . Hypertension   . Hypothyroidism   . Irritable bowel syndrome   . Pancreatitis    after ERCP  . Pneumonia   . PONV (postoperative nausea and vomiting)     Tobacco History: Social History   Tobacco Use  Smoking Status Never Smoker  Smokeless Tobacco Never Used   Counseling given: Not Answered   Outpatient Medications Prior to Visit  Medication Sig Dispense Refill  . acetaminophen (TYLENOL) 500 MG tablet Take 1,000 mg by mouth every 6 (six) hours as needed for mild pain.     Marland Kitchen albuterol (PROVENTIL HFA) 108 (90 Base) MCG/ACT inhaler INHALE 2 PUFFS INTO THE LUNGS EVERY 6 (SIX) HOURS AS NEEDED. 1 Inhaler 3  . aspirin 81 MG EC tablet Take 81 mg by mouth daily.    . budesonide-formoterol (SYMBICORT) 80-4.5 MCG/ACT inhaler Inhale 2 puffs into the lungs 2 (two) times daily. 1 Inhaler 3  . cetirizine (ZYRTEC) 10 MG tablet Take 10 mg by mouth daily as needed.     Marland Kitchen esomeprazole (NEXIUM) 40 MG capsule Take one capsule each morning 30-60 minutes prior to breakfast each day. 30 capsule 11  . Evolocumab (  REPATHA SURECLICK) 001 MG/ML SOAJ Inject 1 pen into the skin every 14 (fourteen) days. 2 mL 11  . fish oil-omega-3 fatty acids 1000 MG capsule Take 1 g by mouth daily.     . fluticasone (FLONASE) 50 MCG/ACT nasal spray Place 2 sprays into both nostrils daily. 16 g 2  . levothyroxine (SYNTHROID) 75 MCG tablet Take 75 mcg by mouth daily.    . metoprolol succinate (TOPROL-XL) 25 MG 24 hr tablet TAKE 1 TABLET BY MOUTH EVERY DAY 90 tablet 3  . Multiple Vitamin (MULITIVITAMIN WITH MINERALS) TABS Take 1 tablet by mouth daily.      No  facility-administered medications prior to visit.     Review of Systems:   Constitutional:   No  weight loss, night sweats,  Fevers, chills, fatigue, or  lassitude.  HEENT:   No headaches,  Difficulty swallowing,  Tooth/dental problems, or  Sore throat,                No sneezing, itching, +ear ache,  +nasal congestion, post nasal drip,   CV:  No chest pain,  Orthopnea, PND, swelling in lower extremities, anasarca, dizziness, palpitations, syncope.   GI  No heartburn, indigestion, abdominal pain, nausea, vomiting, diarrhea, change in bowel habits, loss of appetite, bloody stools.   Resp:  No wheezing.  No chest wall deformity  Skin: no rash or lesions.  GU: no dysuria, change in color of urine, no urgency or frequency.  No flank pain, no hematuria   MS:  No joint pain or swelling.  No decreased range of motion.  No back pain.    Physical Exam  BP 132/84   Pulse 67   Ht 5\' 1"  (1.549 m)   Wt 176 lb (79.8 kg)   SpO2 100%   BMI 33.25 kg/m   GEN: A/Ox3; pleasant , NAD, well nourished    HEENT:  [place]/AT, mild redness along the left inner ear ,  NOSE-clear, THROAT-clear, no lesions, no postnasal drip or exudate noted.  Maxillary sinus tenderness increased on the right  NECK:  Supple w/ fair ROM; no JVD; normal carotid impulses w/o bruits; no thyromegaly or nodules palpated; no lymphadenopathy.    RESP  Clear  P & A; w/o, wheezes/ rales/ or rhonchi. no accessory muscle use, no dullness to percussion  CARD:  RRR, no m/r/g, no peripheral edema, pulses intact, no cyanosis or clubbing.  GI:   Soft & nt; nml bowel sounds; no organomegaly or masses detected.   Musco: Warm bil, no deformities or joint swelling noted.   Neuro: alert, no focal deficits noted.    Skin: Warm, no lesions or rashes    Lab Results:    BNP  Imaging: No results found.    PFT Results Latest Ref Rng & Units 08/27/2017  FVC-Pre L 2.63  FVC-Predicted Pre % 110  FVC-Post L 2.59  FVC-Predicted  Post % 109  Pre FEV1/FVC % % 85  Post FEV1/FCV % % 87  FEV1-Pre L 2.23  FEV1-Predicted Pre % 121  FEV1-Post L 2.26  DLCO uncorrected ml/min/mmHg 21.92  DLCO UNC% % 98  DLCO corrected ml/min/mmHg 21.54  DLCO COR %Predicted % 96  DLVA Predicted % 113  TLC L 5.14  TLC % Predicted % 106  RV % Predicted % 109    Lab Results  Component Value Date   NITRICOXIDE 17 05/06/2017        Assessment & Plan:   Acute sinusitis We will treat with empiric antibiotics.  Have asked her to do a COVID-19 test and call if positive results  Plan  Patient Instructions  Covid 19 test  Zpack take as directed.  Mucinex Twice daily  As needed  Cough/congestion  Continue on Symbicort 2 puffs Twice daily  , rinse after use.  Continue on Flonase 2 puff daily .  Continue on Zyrtec 10mg  At bedtime   Saline nasal spray As needed   Albuterol inhaler As needed   Follow up with Dr. Halford Chessman  Or Avo Schlachter NP in 3-4 months and As needed   Please contact office for sooner follow up if symptoms do not improve or worsen or seek emergency care          Mild persistent asthma Appears controlled without exacerbation.  Patient is advised of asthma action plan.  Plan  Patient Instructions  Covid 19 test  Zpack take as directed.  Mucinex Twice daily  As needed  Cough/congestion  Continue on Symbicort 2 puffs Twice daily  , rinse after use.  Continue on Flonase 2 puff daily .  Continue on Zyrtec 10mg  At bedtime   Saline nasal spray As needed   Albuterol inhaler As needed   Follow up with Dr. Halford Chessman  Or Larraine Argo NP in 3-4 months and As needed   Please contact office for sooner follow up if symptoms do not improve or worsen or seek emergency care             Rexene Edison, NP 10/24/2020

## 2020-10-24 NOTE — Patient Instructions (Addendum)
Covid 19 test  Zpack take as directed.  Mucinex Twice daily  As needed  Cough/congestion  Continue on Symbicort 2 puffs Twice daily  , rinse after use.  Continue on Flonase 2 puff daily .  Continue on Zyrtec 10mg  At bedtime   Saline nasal spray As needed   Albuterol inhaler As needed   Follow up with Dr. Halford Chessman  Or Leopold Smyers NP in 3-4 months and As needed   Please contact office for sooner follow up if symptoms do not improve or worsen or seek emergency care

## 2020-10-24 NOTE — Assessment & Plan Note (Signed)
We will treat with empiric antibiotics.  Have asked her to do a COVID-19 test and call if positive results  Plan  Patient Instructions  Covid 19 test  Zpack take as directed.  Mucinex Twice daily  As needed  Cough/congestion  Continue on Symbicort 2 puffs Twice daily  , rinse after use.  Continue on Flonase 2 puff daily .  Continue on Zyrtec 10mg  At bedtime   Saline nasal spray As needed   Albuterol inhaler As needed   Follow up with Dr. Halford Chessman  Or Reece Fehnel NP in 3-4 months and As needed   Please contact office for sooner follow up if symptoms do not improve or worsen or seek emergency care

## 2020-10-24 NOTE — Telephone Encounter (Signed)
Called and spoke with Patient. Patient stated she has been having on and off allergy symptoms for 2 weeks.  Patient stated she is having sneezing and nasal drainage.  Patient was last seen 10/20/18, by Lynelle Smoke, NP for asthma. Patient scheduled 10/24/20 at 1500.  Nothing further at this time.

## 2020-10-25 NOTE — Progress Notes (Signed)
Reviewed and agree with assessment/plan.   Chesley Mires, MD [facility] Pulmonary/Critical Care 10/25/2020, 8:26 AM Pager:  [phone]

## 2020-10-30 ENCOUNTER — Other Ambulatory Visit: Payer: Medicare Other

## 2020-11-03 ENCOUNTER — Other Ambulatory Visit: Payer: Medicare Other | Admitting: *Deleted

## 2020-11-03 ENCOUNTER — Other Ambulatory Visit: Payer: Self-pay

## 2020-11-03 DIAGNOSIS — E785 Hyperlipidemia, unspecified: Secondary | ICD-10-CM

## 2020-11-03 LAB — LIPID PANEL
Chol/HDL Ratio: 3.2 ratio (ref 0.0–4.4)
Cholesterol, Total: 189 mg/dL (ref 100–199)
HDL: 59 mg/dL (ref >39)
LDL Chol Calc (NIH): 110 mg/dL — ABNORMAL HIGH (ref 0–99)
Triglycerides: 110 mg/dL (ref 0–149)
VLDL Cholesterol Cal: 20 mg/dL (ref 5–40)

## 2020-11-07 ENCOUNTER — Telehealth: Payer: Self-pay | Admitting: Pharmacist

## 2020-11-07 MED ORDER — PRALUENT 150 MG/ML ~~LOC~~ SOAJ: 1.0000 "pen " | SUBCUTANEOUS | 11 refills | Status: DC

## 2020-11-07 NOTE — Telephone Encounter (Signed)
Reviewed labs results with patient. PA for Praluent 150mg  q 14 days approved. Will STOP Repatha and start Praluent 150mg  q 14 days. Recheck lipids in 3 months.

## 2020-11-13 DIAGNOSIS — K219 Gastro-esophageal reflux disease without esophagitis: Secondary | ICD-10-CM | POA: Diagnosis not present

## 2020-11-17 DIAGNOSIS — T466X5A Adverse effect of antihyperlipidemic and antiarteriosclerotic drugs, initial encounter: Secondary | ICD-10-CM | POA: Insufficient documentation

## 2020-11-17 DIAGNOSIS — K719 Toxic liver disease, unspecified: Secondary | ICD-10-CM | POA: Insufficient documentation

## 2020-11-28 DIAGNOSIS — K449 Diaphragmatic hernia without obstruction or gangrene: Secondary | ICD-10-CM | POA: Diagnosis not present

## 2020-11-28 DIAGNOSIS — K219 Gastro-esophageal reflux disease without esophagitis: Secondary | ICD-10-CM | POA: Diagnosis not present

## 2020-12-07 DIAGNOSIS — Z20822 Contact with and (suspected) exposure to covid-19: Secondary | ICD-10-CM | POA: Diagnosis not present

## 2020-12-14 ENCOUNTER — Telehealth: Payer: Self-pay | Admitting: Pharmacist

## 2020-12-14 DIAGNOSIS — I10 Essential (primary) hypertension: Secondary | ICD-10-CM

## 2020-12-14 DIAGNOSIS — E785 Hyperlipidemia, unspecified: Secondary | ICD-10-CM

## 2020-12-14 NOTE — Telephone Encounter (Signed)
Pt left message stating she was returning Melissa's call. Don't see any pt outreach in the past month.  Called pt back and left message. Unsure of reason for call, may need follow up labs scheduled since changing from [place] to [place]. If so, can add on same day she sees Dr Marlou Porch for follow up in Sept.

## 2020-12-18 DIAGNOSIS — Z20822 Contact with and (suspected) exposure to covid-19: Secondary | ICD-10-CM | POA: Diagnosis not present

## 2020-12-18 NOTE — Telephone Encounter (Signed)
Called and spoke with patient. She will get labs at apt with Dr. Marlou Porch on Sept 13.

## 2020-12-18 NOTE — Addendum Note (Signed)
Addended by: Marcelle Overlie D on: 12/18/2020 09:25 AM   Modules accepted: Orders

## 2021-01-24 ENCOUNTER — Other Ambulatory Visit: Payer: Medicare Other

## 2021-01-25 ENCOUNTER — Other Ambulatory Visit: Payer: Medicare Other | Admitting: *Deleted

## 2021-01-25 ENCOUNTER — Other Ambulatory Visit: Payer: Self-pay

## 2021-01-25 DIAGNOSIS — I2510 Atherosclerotic heart disease of native coronary artery without angina pectoris: Secondary | ICD-10-CM | POA: Diagnosis not present

## 2021-01-25 DIAGNOSIS — K719 Toxic liver disease, unspecified: Secondary | ICD-10-CM | POA: Diagnosis not present

## 2021-01-25 DIAGNOSIS — K859 Acute pancreatitis without necrosis or infection, unspecified: Secondary | ICD-10-CM | POA: Diagnosis not present

## 2021-01-25 DIAGNOSIS — I10 Essential (primary) hypertension: Secondary | ICD-10-CM | POA: Diagnosis not present

## 2021-01-25 DIAGNOSIS — E785 Hyperlipidemia, unspecified: Secondary | ICD-10-CM | POA: Diagnosis not present

## 2021-01-25 LAB — LIPID PANEL
Chol/HDL Ratio: 3.2 ratio (ref 0.0–4.4)
Cholesterol, Total: 191 mg/dL (ref 100–199)
HDL: 60 mg/dL (ref >39)
LDL Chol Calc (NIH): 113 mg/dL — ABNORMAL HIGH (ref 0–99)
Triglycerides: 98 mg/dL (ref 0–149)
VLDL Cholesterol Cal: 18 mg/dL (ref 5–40)

## 2021-01-29 ENCOUNTER — Telehealth: Payer: Self-pay | Admitting: Pharmacist

## 2021-01-29 DIAGNOSIS — K719 Toxic liver disease, unspecified: Secondary | ICD-10-CM

## 2021-01-29 DIAGNOSIS — I2510 Atherosclerotic heart disease of native coronary artery without angina pectoris: Secondary | ICD-10-CM

## 2021-01-29 MED ORDER — EZETIMIBE 10 MG PO TABS: 10.0000 mg | ORAL_TABLET | Freq: Every day | ORAL | 3 refills | Status: DC

## 2021-01-29 NOTE — Telephone Encounter (Signed)
Called pt to discuss lab results and plan to add zetia with recheck of LFT in 2 weeks. LVM for pt to call

## 2021-01-29 NOTE — Telephone Encounter (Signed)
I spoke with patient. She is in agreement to start zetia. Will check LFT in 3 weeks 10/3.

## 2021-01-29 NOTE — Telephone Encounter (Signed)
-----   Message from Milus Banister, MD sent at 01/29/2021  7:04 AM EDT ----- Thanks for the message. That should be safe, would still keep a closer than usual eye on her LFTs for the first few months given her history.  Her liver has proven to be pretty sensitive.    DJ   ----- Message ----- From: Jerline Pain, MD Sent: 01/29/2021   6:31 AM EDT To: Milus Banister, MD, Ramond Dial, RPH-CPP  Thank you.  Seems like a reasonable option. Candee Furbish, MD  ----- Message ----- From: Ramond Dial, RPH-CPP Sent: 01/26/2021   2:11 PM EDT To: Milus Banister, MD, Jerline Pain, MD  Sub-optimal response to both Repatha and Praluent. 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  I will see if Dr. Ardis Hughs is ok with her trying Zetia. Low risk of LFT elevated. Works more in the GI tract. Most LFT elevation seen with use with statins which patient is not on. LFT have been added on to yesterdays labs

## 2021-01-30 ENCOUNTER — Ambulatory Visit (INDEPENDENT_AMBULATORY_CARE_PROVIDER_SITE_OTHER): Payer: Medicare Other | Admitting: Cardiology

## 2021-01-30 ENCOUNTER — Other Ambulatory Visit: Payer: Medicare Other

## 2021-01-30 ENCOUNTER — Encounter: Payer: Self-pay | Admitting: Cardiology

## 2021-01-30 ENCOUNTER — Other Ambulatory Visit: Payer: Self-pay

## 2021-01-30 DIAGNOSIS — E782 Mixed hyperlipidemia: Secondary | ICD-10-CM

## 2021-01-30 DIAGNOSIS — I2510 Atherosclerotic heart disease of native coronary artery without angina pectoris: Secondary | ICD-10-CM | POA: Diagnosis not present

## 2021-01-30 NOTE — Assessment & Plan Note (Signed)
Nonobstructive coronary artery disease seen on cardiac catheterization 2019.  Continue with goal-directed medical therapy which includes Praluent 150 mg/mL inject 1 pen into the skin every 14 days, Zetia new start 10 mg daily.  Metoprolol XL 25 mg daily.  Omega-3 fish oil 1 g daily.  She is unable to tolerate statin therapy because of hepatic injury.  Dr. Ardis Hughs GI.  We are trying Zetia with very close monitoring of her LFTs.

## 2021-01-30 NOTE — Assessment & Plan Note (Signed)
Currently on Praluent and new start Zetia.  She did not tolerate statins.  She cannot use a statin ever again in her lifetime.  She has had hepatic injury with these.  Dr. Ardis Hughs is following as well.  We are closely following her LFTs with Zetia.  Interestingly, she did not have a robust response to the Praluent.  Her LDL still remains above goal.  Last check 113.

## 2021-01-30 NOTE — Patient Instructions (Signed)
Medication Instructions:  The current medical regimen is effective;  continue present plan and medications.  *If you need a refill on your cardiac medications before your next appointment, please call your pharmacy*  Follow-Up: At [facility], you and your health needs are our priority.  As part of our continuing mission to provide you with exceptional heart care, we have created designated Provider Care Teams.  These Care Teams include your primary Cardiologist (physician) and Advanced Practice Providers (APPs -  Physician Assistants and Nurse Practitioners) who all work together to provide you with the care you need, when you need it.  We recommend signing up for the patient portal called "MyChart".  Sign up information is provided on this After Visit Summary.  MyChart is used to connect with patients for Virtual Visits (Telemedicine).  Patients are able to view lab/test results, encounter notes, upcoming appointments, etc.  Non-urgent messages can be sent to your provider as well.   To learn more about what you can do with MyChart, go to [email or web address].    Your next appointment:   1 year(s)  The format for your next appointment:   In Person  Provider:   Mark Skains, MD   Thank you for choosing [facility]!!    

## 2021-01-30 NOTE — Progress Notes (Signed)
Cardiology Office Note:    Date:  01/30/2021   ID:  Dominique Mooney, DOB Jul 20, 1950, MRN [id number]  PCP:  Dominique Seashore, MD   [facility] Providers Cardiologist:  Dominique Furbish, MD     Referring MD: Dominique Seashore, MD     History of Present Illness:    Dominique Mooney is a 70 y.o. female with prior statin induced hepatic injury, followed as well by Dr. Ardis Hughs with hyperlipidemia.  Appreciate help from lipid clinic.  She had a suboptimal response to both Repatha as well as Praluent.  We corresponded with pharmacy team as well as Dr. Ardis Hughs with gastroenterology and we will try Zetia with close monitoring of her LFTs.  She is coronary artery disease with cardiac catheterization in 2019 showing nonobstructive CAD.  She does feel today a little bit of tenderness in her right upper quadrant region like she is experienced in the past.  Recently her LFTs were normal.  Previous CT scan of the abdomen and pelvis, unremarkable.  She did have some hepatic steatosis.  Last LDL 113 despite Praluent.  Triglycerides 98.  ALT 6 AST 24  Alzheimer's disease runs in her family.  Past Medical History:  Diagnosis Date   Allergic rhinitis        Arthritis    Asthma        Chest pressure    a. Normal Stress Echo [id number]   Complication of anesthesia    GERD (gastroesophageal reflux disease)    Hyperlipidemia    Hypertension    Hypothyroidism    Irritable bowel syndrome    Pancreatitis    after ERCP   Pneumonia    PONV (postoperative nausea and vomiting)     Past Surgical History:  Procedure Laterality Date   BRAVO [facility] STUDY  05/02/2011   Procedure: BRAVO [place] STUDY;  Surgeon: Owens Loffler, MD;  Location: [facility] ENDOSCOPY;  Service: Endoscopy;  Laterality: N/A;  48 hour wirless pH test (Bravo Test)   CHOLECYSTECTOMY     ESOPHAGOGASTRODUODENOSCOPY  05/02/2011   Procedure: ESOPHAGOGASTRODUODENOSCOPY (EGD);  Surgeon: Owens Loffler, MD;  Location: [facility];  Service: Endoscopy;   Laterality: N/A;   HERNIA REPAIR     LEFT HEART CATH AND CORONARY ANGIOGRAPHY N/A 06/26/2017   Procedure: LEFT HEART CATH AND CORONARY ANGIOGRAPHY;  Surgeon: Belva Crome, MD;  Location: [facility];  Service: Cardiovascular;  Laterality: N/A;   MENISCUS REPAIR Left 03/18/2016   and repair of cracked bone   SALPINGOOPHORECTOMY     TOTAL ABDOMINAL HYSTERECTOMY     TOTAL KNEE ARTHROPLASTY Left 07/16/2019   Procedure: TOTAL KNEE ARTHROPLASTY;  Surgeon: Sydnee Cabal, MD;  Location: [facility] ORS;  Service: Orthopedics;  Laterality: Left;    Current Medications: Current Meds  Medication Sig   acetaminophen (TYLENOL) 500 MG tablet Take 1,000 mg by mouth every 6 (six) hours as needed for mild pain.    albuterol (PROVENTIL HFA) 108 (90 Base) MCG/ACT inhaler INHALE 2 PUFFS INTO THE LUNGS EVERY 6 (SIX) HOURS AS NEEDED.   Alirocumab (PRALUENT) 150 MG/ML SOAJ Inject 1 pen into the skin every 14 (fourteen) days.   aspirin 81 MG EC tablet Take 81 mg by mouth daily.   budesonide-formoterol (SYMBICORT) 80-4.5 MCG/ACT inhaler Inhale 2 puffs into the lungs 2 (two) times daily.   cetirizine (ZYRTEC) 10 MG tablet Take 10 mg by mouth daily as needed.    esomeprazole (NEXIUM) 40 MG capsule Take one capsule each morning 30-60 minutes prior to breakfast  each day.   ezetimibe (ZETIA) 10 MG tablet Take 1 tablet (10 mg total) by mouth daily.   fish oil-omega-3 fatty acids 1000 MG capsule Take 1 g by mouth daily.    fluticasone (FLONASE) 50 MCG/ACT nasal spray Place 2 sprays into both nostrils daily.   levothyroxine (SYNTHROID) 75 MCG tablet Take 75 mcg by mouth daily.   metoprolol succinate (TOPROL-XL) 25 MG 24 hr tablet TAKE 1 TABLET BY MOUTH EVERY DAY   Multiple Vitamin (MULITIVITAMIN WITH MINERALS) TABS Take 1 tablet by mouth daily.      Allergies:   Statins, Penicillins, and Atorvastatin   Social History   Socioeconomic History   Marital status: Married    Spouse name: Not on file   Number of  children: 2   Years of education: Not on file   Highest education level: Not on file  Occupational History   Occupation: retired    Fish farm manager: [employer]  Tobacco Use   Smoking status: Never   Smokeless tobacco: Never  Vaping Use   Vaping Use: Never used  Substance and Sexual Activity   Alcohol use: No   Drug use: No   Sexual activity: Not on file  Other Topics Concern   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   Currently retired as of 07/2012      [facility] (11/26/16):   Originally from [place]. Has always lived in [place]. She retired from [employer]. 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 No pets currently. No bird exposure. No mold or hot tub exposure. Does have carpet in her bedroom. No feather bedding. She has blinds & draperies. No indoor plants. Enjoys helping out in her church and with nonprofit work. Enjoys going to the beach.    Social Determinants of Health   Financial Resource Strain: Not on file  Food Insecurity: Not on file  Transportation Needs: Not on file  Physical Activity: Not on file  Stress: Not on file  Social Connections: Not on file     Family History: The patient's family history includes Allergies in her brother, sister, and son; Colon polyps in her sister; Heart attack in her brother and brother; Stroke in her father and mother. There is no history of Colon cancer, Stomach cancer, Esophageal cancer, Lung disease, Cancer, or Rectal cancer.  ROS:   Please see the history of present illness.    No chest pain no shortness of breath no syncope no bleeding all other systems reviewed and are negative.  EKGs/Labs/Other Studies Reviewed:    The following studies were reviewed today: Cath 06/26/17: Diagnostic Dominance: Co-dominant    Recent Labs: 07/18/2020: BUN 14; Creatinine, Ser 1.10; Potassium 4.3; Sodium 137 01/25/2021: ALT 6  Recent Lipid Panel     Component Value Date/Time   CHOL 191 01/25/2021 0722   TRIG 98 01/25/2021 0722   HDL 60 01/25/2021 0722   CHOLHDL 3.2 01/25/2021 0722   LDLCALC 113 (H) 01/25/2021 0722     Risk Assessment/Calculations:          Physical Exam:    VS:  BP 120/80 (BP Location: Left Arm, Patient Position: Sitting, Cuff Size: Normal)   Pulse 67   Ht '5\' 1"'$  (1.549 m)   Wt 167 lb (75.8 kg)   SpO2 97%   BMI 31.55 kg/m     Wt Readings from Last 3 Encounters:  01/30/21 167  lb (75.8 kg)  10/24/20 176 lb (79.8 kg)  07/21/20 180 lb (81.6 kg)     GEN:  Well nourished, well developed in no acute distress HEENT: Normal NECK: No JVD; No carotid bruits LYMPHATICS: No lymphadenopathy CARDIAC: RRR, no murmurs, rubs, gallops RESPIRATORY:  Clear to auscultation without rales, wheezing or rhonchi  ABDOMEN: Soft, non-tender, non-distended MUSCULOSKELETAL:  No edema; No deformity  SKIN: Warm and dry NEUROLOGIC:  Alert and oriented x 3 PSYCHIATRIC:  Normal affect   ASSESSMENT:    1. CAD in native artery   2. Mixed hyperlipidemia    PLAN:    In order of problems listed above:  CAD in native artery Nonobstructive coronary artery disease seen on cardiac catheterization 2019.  Continue with goal-directed medical therapy which includes Praluent 150 mg/mL inject 1 pen into the skin every 14 days, Zetia new start 10 mg daily.  Metoprolol XL 25 mg daily.  Omega-3 fish oil 1 g daily.  She is unable to tolerate statin therapy because of hepatic injury.  Dr. Ardis Hughs GI.  We are trying Zetia with very close monitoring of her LFTs.  Hyperlipidemia Currently on Praluent and new start Zetia.  She did not tolerate statins.  She cannot use a statin ever again in her lifetime.  She has had hepatic injury with these.  Dr. Ardis Hughs is following as well.  We are closely following her LFTs with Zetia.  Interestingly, she did not have a robust response to the Praluent.  Her LDL still remains above goal.  Last check 113.          Medication Adjustments/Labs and Tests Ordered: Current medicines are reviewed at length with the patient today.  Concerns regarding medicines are outlined above.  No orders of the defined types were placed in this encounter.  No orders of the defined types were placed in this encounter.   Patient Instructions  Medication Instructions:  The current medical regimen is effective;  continue present plan and medications.  *If you need a refill on your cardiac medications before your next appointment, please call your pharmacy*  Follow-Up: At [facility], you and your health needs are our priority.  As part of our continuing mission to provide you with exceptional heart care, we have created designated Provider Care Teams.  These Care Teams include your primary Cardiologist (physician) and Advanced Practice Providers (APPs -  Physician Assistants and Nurse Practitioners) who all work together to provide you with the care you need, when you need it.  We recommend signing up for the patient portal called "MyChart".  Sign up information is provided on this After Visit Summary.  MyChart is used to connect with patients for Virtual Visits (Telemedicine).  Patients are able to view lab/test results, encounter notes, upcoming appointments, etc.  Non-urgent messages can be sent to your provider as well.   To learn more about what you can do with MyChart, go to [email or web address].    Your next appointment:   1 year(s)  The format for your next appointment:   In Person  Provider:   Candee Furbish, MD   Thank you for choosing [facility]!!     Signed, Dominique Furbish, MD  01/30/2021 9:39 AM    [place]

## 2021-01-30 NOTE — Telephone Encounter (Signed)
pt on the line calling w/ questions about meds she just picked up , are you available?

## 2021-02-17 LAB — HEPATIC FUNCTION PANEL (6)
ALT: 6 IU/L (ref 0–32)
AST: 24 IU/L (ref 0–40)
Albumin: 4.3 g/dL (ref 3.8–4.8)
Alkaline Phosphatase: 115 IU/L (ref 44–121)
Bilirubin Total: 0.5 mg/dL (ref 0.0–1.2)
Bilirubin, Direct: 0.17 mg/dL (ref 0.00–0.40)

## 2021-02-17 LAB — SPECIMEN STATUS REPORT

## 2021-02-19 ENCOUNTER — Other Ambulatory Visit: Payer: Medicare Other | Admitting: *Deleted

## 2021-02-19 ENCOUNTER — Other Ambulatory Visit: Payer: Self-pay

## 2021-02-19 DIAGNOSIS — K719 Toxic liver disease, unspecified: Secondary | ICD-10-CM

## 2021-02-19 DIAGNOSIS — I2510 Atherosclerotic heart disease of native coronary artery without angina pectoris: Secondary | ICD-10-CM

## 2021-02-19 LAB — HEPATIC FUNCTION PANEL
ALT: 6 IU/L (ref 0–32)
AST: 25 IU/L (ref 0–40)
Albumin: 4.1 g/dL (ref 3.8–4.8)
Alkaline Phosphatase: 119 IU/L (ref 44–121)
Bilirubin Total: 0.7 mg/dL (ref 0.0–1.2)
Bilirubin, Direct: 0.23 mg/dL (ref 0.00–0.40)
Total Protein: 7 g/dL (ref 6.0–8.5)

## 2021-02-20 ENCOUNTER — Telehealth: Payer: Self-pay | Admitting: Pharmacist

## 2021-02-20 ENCOUNTER — Other Ambulatory Visit: Payer: Self-pay | Admitting: Obstetrics and Gynecology

## 2021-02-20 DIAGNOSIS — Z1231 Encounter for screening mammogram for malignant neoplasm of breast: Secondary | ICD-10-CM

## 2021-02-20 DIAGNOSIS — E782 Mixed hyperlipidemia: Secondary | ICD-10-CM

## 2021-02-20 NOTE — Telephone Encounter (Signed)
Patient is returning call.  °

## 2021-02-20 NOTE — Telephone Encounter (Signed)
Called patient to let her know LFT are stable. Will plan to recheck LFT and lipids in 1.5 months. Left VM for pt to call back.

## 2021-02-20 NOTE — Telephone Encounter (Signed)
Patient made aware of results. She will come in 11/15 for repeat lft and lipid panel

## 2021-02-23 ENCOUNTER — Ambulatory Visit: Payer: Medicare Other | Admitting: Adult Health

## 2021-03-07 ENCOUNTER — Ambulatory Visit: Payer: Medicare Other | Admitting: Adult Health

## 2021-03-09 DIAGNOSIS — H40013 Open angle with borderline findings, low risk, bilateral: Secondary | ICD-10-CM | POA: Diagnosis not present

## 2021-03-09 DIAGNOSIS — H04123 Dry eye syndrome of bilateral lacrimal glands: Secondary | ICD-10-CM | POA: Diagnosis not present

## 2021-03-21 DIAGNOSIS — E039 Hypothyroidism, unspecified: Secondary | ICD-10-CM | POA: Diagnosis not present

## 2021-03-30 ENCOUNTER — Other Ambulatory Visit: Payer: Self-pay

## 2021-03-30 ENCOUNTER — Ambulatory Visit
Admission: RE | Admit: 2021-03-30 | Discharge: 2021-03-30 | Disposition: A | Discharge status: Home / Self Care | Payer: Medicare Other | Source: Ambulatory Visit | Attending: Obstetrics and Gynecology | Admitting: Obstetrics and Gynecology

## 2021-03-30 DIAGNOSIS — Z1231 Encounter for screening mammogram for malignant neoplasm of breast: Secondary | ICD-10-CM

## 2021-04-03 ENCOUNTER — Other Ambulatory Visit: Payer: Self-pay

## 2021-04-03 ENCOUNTER — Other Ambulatory Visit: Payer: Medicare Other | Admitting: *Deleted

## 2021-04-03 DIAGNOSIS — E782 Mixed hyperlipidemia: Secondary | ICD-10-CM

## 2021-04-03 LAB — LIPID PANEL
Chol/HDL Ratio: 2.7 ratio (ref 0.0–4.4)
Cholesterol, Total: 159 mg/dL (ref 100–199)
HDL: 60 mg/dL (ref >39)
LDL Chol Calc (NIH): 85 mg/dL (ref 0–99)
Triglycerides: 73 mg/dL (ref 0–149)
VLDL Cholesterol Cal: 14 mg/dL (ref 5–40)

## 2021-04-03 LAB — HEPATIC FUNCTION PANEL
ALT: 6 IU/L (ref 0–32)
AST: 28 IU/L (ref 0–40)
Albumin: 4.2 g/dL (ref 3.8–4.8)
Alkaline Phosphatase: 111 IU/L (ref 44–121)
Bilirubin Total: 0.6 mg/dL (ref 0.0–1.2)
Bilirubin, Direct: 0.16 mg/dL (ref 0.00–0.40)
Total Protein: 7.3 g/dL (ref 6.0–8.5)

## 2021-04-05 ENCOUNTER — Telehealth: Payer: Self-pay | Admitting: Pharmacist

## 2021-04-05 NOTE — Telephone Encounter (Signed)
Patient made aware of results. Continue Praluent 150mg  q 14 days and zetia 10mg  daily. Recheck LFT in 6 months.

## 2021-04-10 ENCOUNTER — Encounter: Payer: Self-pay | Admitting: *Deleted

## 2021-04-16 ENCOUNTER — Other Ambulatory Visit: Payer: Self-pay | Admitting: Cardiology

## 2021-07-16 DIAGNOSIS — I25118 Atherosclerotic heart disease of native coronary artery with other forms of angina pectoris: Secondary | ICD-10-CM | POA: Diagnosis not present

## 2021-07-16 DIAGNOSIS — K219 Gastro-esophageal reflux disease without esophagitis: Secondary | ICD-10-CM | POA: Diagnosis not present

## 2021-07-16 DIAGNOSIS — R079 Chest pain, unspecified: Secondary | ICD-10-CM | POA: Diagnosis not present

## 2021-07-16 DIAGNOSIS — K449 Diaphragmatic hernia without obstruction or gangrene: Secondary | ICD-10-CM | POA: Diagnosis not present

## 2021-07-19 ENCOUNTER — Telehealth: Payer: Self-pay | Admitting: Cardiology

## 2021-07-19 NOTE — Telephone Encounter (Signed)
Pt states she is not having current CP. ?States she was calling because she saw her PCP on Monday and mentioned that she was having occasional CP in the center of her chest.  States that PCP reached out to Dr. Marlou Porch and told her that our office would be reaching out to schedule pt for an appointment. ?She was simply calling because she has not heard anything. ?Aware I will forward to the RN to follow up on  ?Pt agreeable to plan ? ?

## 2021-07-19 NOTE — Telephone Encounter (Signed)
Pt c/o of Chest Pain: STAT if CP now or developed within 24 hours ? ?1. Are you having CP right now? yes ? ?2. Are you experiencing any other symptoms (ex. SOB, nausea, vomiting, sweating)? sob ? ?3. How long have you been experiencing CP? A couple of days ? ?4. Is your CP continuous or coming and going? Comes and goes  ? ?5. Have you taken Nitroglycerin? no ?? ? ?

## 2021-07-20 NOTE — Telephone Encounter (Signed)
Called and spoke with pt who is agreeable for appt with Derl Barrow on Monday 3/6 @ 4:20 pm.  She is aware to c/b if s/s reoccur prior to appt or report to closest ED if necessary.   ?

## 2021-07-23 ENCOUNTER — Other Ambulatory Visit: Payer: Self-pay

## 2021-07-23 ENCOUNTER — Ambulatory Visit (INDEPENDENT_AMBULATORY_CARE_PROVIDER_SITE_OTHER): Payer: Medicare Other | Admitting: Cardiology

## 2021-07-23 ENCOUNTER — Encounter: Payer: Self-pay | Admitting: Cardiology

## 2021-07-23 VITALS — BP 108/68 | HR 79 | Ht 64.0 in | Wt 152.8 lb

## 2021-07-23 DIAGNOSIS — I2510 Atherosclerotic heart disease of native coronary artery without angina pectoris: Secondary | ICD-10-CM | POA: Diagnosis not present

## 2021-07-23 DIAGNOSIS — Z01812 Encounter for preprocedural laboratory examination: Secondary | ICD-10-CM

## 2021-07-23 DIAGNOSIS — K219 Gastro-esophageal reflux disease without esophagitis: Secondary | ICD-10-CM | POA: Diagnosis not present

## 2021-07-23 DIAGNOSIS — R072 Precordial pain: Secondary | ICD-10-CM

## 2021-07-23 MED ORDER — METOPROLOL TARTRATE 50 MG PO TABS: 50.0000 mg | ORAL_TABLET | ORAL | 0 refills | Status: DC

## 2021-07-23 NOTE — Assessment & Plan Note (Addendum)
Since she is having an episode or 2 of chest discomfort, lets go ahead and check a coronary CT scan since it has been 4 years since her last coronary evaluation.  She had mild nonobstructive disease previously, 30% distal LAD 40% ostial diagonal.  Agree with nitroglycerin as needed.  Continue aspirin.  Continue Praluent.  She has also done a really good job with weight loss.  Down about 20 pounds. ?

## 2021-07-23 NOTE — Assessment & Plan Note (Signed)
We will go ahead and reevaluate her coronary arteries.  She did take a nitroglycerin and this seemed to help.  This could also help with GERD/esophageal spasm in certain situations. ?

## 2021-07-23 NOTE — Progress Notes (Signed)
Cardiology Office Note:    Date:  07/24/2021   ID:  Dominique Mooney, DOB Apr 19, 1951, MRN [id number]  PCP:  Merrilee Seashore, MD   [facility] Providers Cardiologist:  Candee Furbish, MD     Referring MD: Merrilee Seashore, MD    History of Present Illness:    Dominique Mooney is a 71 y.o. female  here with GER like CP, shoot to the left side. Pressure like. with prior statin induced hepatic injury, followed as well by Dr. Ardis Hughs with hyperlipidemia.  Appreciate help from lipid clinic.  She had a suboptimal response to both Repatha as well as Praluent.  We corresponded with pharmacy team as well as Dr. Ardis Hughs with gastroenterology and we will try Zetia with close monitoring of her LFTs.   She is coronary artery disease with cardiac catheterization in 2019 showing nonobstructive CAD.   She does feel today a little bit of tenderness in her right upper quadrant region like she is experienced in the past.  Recently her LFTs were normal.  Previous CT scan of the abdomen and pelvis, unremarkable.  She did have some hepatic steatosis.   Last LDL 113 despite Praluent.  Triglycerides 98.  ALT 6 AST 24   Alzheimer's disease runs in her family.  Past Medical History:  Diagnosis Date   Allergic rhinitis        Arthritis    Asthma        Chest pressure    a. Normal Stress Echo 05/6071   Complication of anesthesia    GERD (gastroesophageal reflux disease)    Hyperlipidemia    Hypertension    Hypothyroidism    Irritable bowel syndrome    Pancreatitis    after ERCP   Pneumonia    PONV (postoperative nausea and vomiting)     Past Surgical History:  Procedure Laterality Date   BRAVO [facility] STUDY  05/02/2011   Procedure: BRAVO [place] STUDY;  Surgeon: Owens Loffler, MD;  Location: [facility] ENDOSCOPY;  Service: Endoscopy;  Laterality: N/A;  48 hour wirless pH test (Bravo Test)   CHOLECYSTECTOMY     ESOPHAGOGASTRODUODENOSCOPY  05/02/2011   Procedure: ESOPHAGOGASTRODUODENOSCOPY (EGD);  Surgeon: Owens Loffler, MD;  Location: [facility];  Service: Endoscopy;  Laterality: N/A;   HERNIA REPAIR     LEFT HEART CATH AND CORONARY ANGIOGRAPHY N/A 06/26/2017   Procedure: LEFT HEART CATH AND CORONARY ANGIOGRAPHY;  Surgeon: Belva Crome, MD;  Location: [facility] [facility] CV LAB;  Service: Cardiovascular;  Laterality: N/A;   MENISCUS REPAIR Left 03/18/2016   and repair of cracked bone   SALPINGOOPHORECTOMY     TOTAL ABDOMINAL HYSTERECTOMY     TOTAL KNEE ARTHROPLASTY Left 07/16/2019   Procedure: TOTAL KNEE ARTHROPLASTY;  Surgeon: Sydnee Cabal, MD;  Location: [facility] ORS;  Service: Orthopedics;  Laterality: Left;    Current Medications: Current Meds  Medication Sig   metoprolol tartrate (LOPRESSOR) 50 MG tablet Take 1 tablet (50 mg total) by mouth as directed. Take one tablet 2 hours before your CT scan     Allergies:   Statins, Penicillins, and Atorvastatin   Social History   Socioeconomic History   Marital status: Married    Spouse name: Not on file   Number of children: 2   Years of education: Not on file   Highest education level: Not on file  Occupational History   Occupation: retired    Fish farm manager: [employer]  Tobacco Use   Smoking status: Never   Smokeless tobacco:  Never  Vaping Use   Vaping Use: Never used  Substance and Sexual Activity   Alcohol use: No   Drug use: No   Sexual activity: Not on file  Other Topics Concern   Not on file  Social History Narrative   Works as Development worker, 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   Currently retired as of 07/2012      [facility] (11/26/16):   Originally from [facility] [place]. Has always lived in [place]. She retired from [employer]. 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 No pets currently. No bird exposure. No mold or hot tub exposure. Does have carpet in her bedroom. No feather bedding. She has blinds & draperies. No indoor plants. Enjoys helping out in her church and with nonprofit work. Enjoys going to the  beach.    Social Determinants of Health   Financial Resource Strain: Not on file  Food Insecurity: Not on file  Transportation Needs: Not on file  Physical Activity: Not on file  Stress: Not on file  Social Connections: Not on file     Family History: The patient's family history includes Allergies in her brother, sister, and son; Colon polyps in her sister; Heart attack in her brother and brother; Stroke in her father and mother. There is no history of Colon cancer, Stomach cancer, Esophageal cancer, Lung disease, Cancer, Rectal cancer, or Breast cancer.  ROS:   Please see the history of present illness.     All other systems reviewed and are negative.  EKGs/Labs/Other Studies Reviewed:    The following studies were reviewed today: Cath 06/26/17: Diagnostic Dominance: Co-dominant      EKG:  EKG is  ordered today.  The ekg ordered today demonstrates sinus rhythm 79 with nonspecific ST-T wave changes  Recent Labs: 04/03/2021: ALT 6 07/23/2021: BUN 9; Creatinine, Ser 1.05; Potassium 4.2; Sodium 141  Recent Lipid Panel    Component Value Date/Time   CHOL 159 04/03/2021 0819   TRIG 73 04/03/2021 0819   HDL 60 04/03/2021 0819   CHOLHDL 2.7 04/03/2021 0819   LDLCALC 85 04/03/2021 0819     Risk Assessment/Calculations:              Physical Exam:    VS:  BP 108/68 (BP Location: Left Arm, Patient Position: Sitting, Cuff Size: Normal)    Pulse 79    Ht '5\' 4"'$  (1.626 m)    Wt 152 lb 12.8 oz (69.3 kg)    BMI 26.23 kg/m     Wt Readings from Last 3 Encounters:  07/23/21 152 lb 12.8 oz (69.3 kg)  01/30/21 167 lb (75.8 kg)  10/24/20 176 lb (79.8 kg)     GEN:  Well nourished, well developed in no acute distress HEENT: Normal NECK: No JVD; No carotid bruits LYMPHATICS: No lymphadenopathy CARDIAC: RRR, no murmurs, no rubs, gallops RESPIRATORY:  Clear to auscultation without rales, wheezing or rhonchi  ABDOMEN: Soft, non-tender, non-distended MUSCULOSKELETAL:  No edema;  No deformity  SKIN: Warm and dry NEUROLOGIC:  Alert and oriented x 3 PSYCHIATRIC:  Normal affect   ASSESSMENT:    1. Precordial pain   2. Pre-procedure lab exam   3. CAD in native artery   4. Gastroesophageal reflux disease, unspecified whether esophagitis present    PLAN:    In order of problems listed above:  CAD in native artery Since she is having an episode or 2 of chest discomfort, lets go ahead and check a coronary  CT scan since it has been 4 years since her last coronary evaluation.  She had mild nonobstructive disease previously, 30% distal LAD 40% ostial diagonal.  Agree with nitroglycerin as needed.  Continue aspirin.  Continue Praluent.  She has also done a really good job with weight loss.  Down about 20 pounds.  Chest pain of uncertain etiology We will go ahead and reevaluate her coronary arteries.  She did take a nitroglycerin and this seemed to help.  This could also help with GERD/esophageal spasm in certain situations.  GERD If cardiac work-up is stable, she will see Dr. Ardis Hughs.        Medication Adjustments/Labs and Tests Ordered: Current medicines are reviewed at length with the patient today.  Concerns regarding medicines are outlined above.  Orders Placed This Encounter  Procedures   CT CORONARY MORPH W/CTA COR W/SCORE W/CA W/CM &/OR WO/CM   Basic metabolic panel   EKG 68-TMHD   Meds ordered this encounter  Medications   metoprolol tartrate (LOPRESSOR) 50 MG tablet    Sig: Take 1 tablet (50 mg total) by mouth as directed. Take one tablet 2 hours before your CT scan    Dispense:  1 tablet    Refill:  0    Patient Instructions  Medication Instructions:  The current medical regimen is effective;  continue present plan and medications.  *If you need a refill on your cardiac medications before your next appointment, please call your pharmacy*   Lab Work: Please have blood work today (BMP)  If you have labs (blood work) drawn today and your  tests are completely normal, you will receive your results only by: MyChart Message (if you have MyChart) OR A paper copy in the mail If you have any lab test that is abnormal or we need to change your treatment, we will call you to review the results.   Testing/Procedures:   Your cardiac CT will be scheduled at:   [facility] [address] [place], [place] [address] [phone]  Please arrive at the [facility] and Children's Entrance (Entrance C2) of [facility] 30 minutes prior to test start time. You can use the FREE valet parking offered at entrance C (encouraged to control the heart rate for the test)  Proceed to the [facility] Radiology Department (first floor) to check-in and test prep.  All radiology patients and guests should use entrance C2 at [facility], accessed from [facility], even though the hospital's physical address listed is [address].    Please follow these instructions carefully (unless otherwise directed):  On the Night Before the Test: Be sure to Drink plenty of water. Do not consume any caffeinated/decaffeinated beverages or chocolate 12 hours prior to your test. Do not take any antihistamines 12 hours prior to your test.  On the Day of the Test: Drink plenty of water until 1 hour prior to the test. Do not eat any food 4 hours prior to the test. You may take your regular medications prior to the test.  Take metoprolol (Lopressor) two hours prior to test. HOLD Furosemide/Hydrochlorothiazide morning of the test. FEMALES- please wear underwire-free bra if available, avoid dresses & tight clothing  After the Test: Drink plenty of water. After receiving IV contrast, you may experience a mild flushed feeling. This is normal. On occasion, you may experience a mild rash up to 24 hours after the test. This is not dangerous. If this occurs, you can take Benadryl  25 mg and increase your fluid intake. If  you experience trouble breathing, this can be serious. If it is severe call 911 IMMEDIATELY. If it is mild, please call our office. If you take any of these medications: Glipizide/Metformin, Avandament, Glucavance, please do not take 48 hours after completing test unless otherwise instructed.  We will call to schedule your test 2-4 weeks out understanding that some insurance companies will need an authorization prior to the service being performed.   For non-scheduling related questions, please contact the cardiac imaging nurse navigator should you have any questions/concerns: Marchia Bond, Cardiac Imaging Nurse Navigator Gordy Clement, Cardiac Imaging Nurse Navigator [facility] Direct Office Dial: [phone]   For scheduling needs, including cancellations and rescheduling, please call [place], [phone].  Follow-Up: At [facility], you and your health needs are our priority.  As part of our continuing mission to provide you with exceptional heart care, we have created designated Provider Care Teams.  These Care Teams include your primary Cardiologist (physician) and Advanced Practice Providers (APPs -  Physician Assistants and Nurse Practitioners) who all work together to provide you with the care you need, when you need it.  We recommend signing up for the patient portal called "MyChart".  Sign up information is provided on this After Visit Summary.  MyChart is used to connect with patients for Virtual Visits (Telemedicine).  Patients are able to view lab/test results, encounter notes, upcoming appointments, etc.  Non-urgent messages can be sent to your provider as well.   To learn more about what you can do with MyChart, go to [email or web address].    Your next appointment:   6 month(s)  The format for your next appointment:   In Person  Provider:   Candee Furbish, MD     Thank you for choosing [facility]!!    Nitroglycerin  Sublingual Tablets What is this medication? NITROGLYCERIN (nye troe GLI ser in) prevents and treats chest pain (angina). It works by relaxing blood vessels, which decreases the amount of work the heart has to do. It belongs to a group of medications called nitrates. This medicine may be used for other purposes; ask your health care provider or pharmacist if you have questions. COMMON BRAND NAME(S): Nitroquick, Nitrostat, Nitrotab What should I tell my care team before I take this medication? They need to know if you have any of these conditions: Anemia Head injury, recent stroke, or bleeding in the brain Liver disease Previous heart attack An unusual or allergic reaction to nitroglycerin, other medications, foods, dyes, or preservatives Pregnant or trying to get pregnant Breast-feeding How should I use this medication? Take this medication by mouth as needed. Use at the first sign of an angina attack (chest pain or tightness). You can also take this medication 5 to 10 minutes before an event likely to produce chest pain. Follow the directions exactly as written on the prescription label. Place one tablet under your tongue and let it dissolve. Do not swallow whole. Replace the dose if you accidentally swallow it. It will help if your mouth is not dry. Saliva around the tablet will help it to dissolve more quickly. Do not eat or drink, smoke or chew tobacco while a tablet is dissolving. Sit down when taking this medication. In an angina attack, you should feel better within 5 minutes after your first dose. You can take a dose every 5 minutes up to a total of 3 doses. If you do not feel better  or feel worse after 1 dose, call 9-1-1 at once. Do not take more than 3 doses in 15 minutes. Your care team might give you other directions. Follow those directions if they do. Do not take your medication more often than directed. Talk to your care team about the use of this medication in children. Special care  may be needed. Overdosage: If you think you have taken too much of this medicine contact a poison control center or emergency room at once. NOTE: This medicine is only for you. Do not share this medicine with others. What if I miss a dose? This does not apply. This medication is only used as needed. What may interact with this medication? Do not take this medication with any of the following: Certain migraine medications like ergotamine and dihydroergotamine (DHE) Medications used to treat erectile dysfunction like sildenafil, tadalafil, and vardenafil Riociguat This medication may also interact with the following: Alteplase Aspirin Heparin Medications for high blood pressure Medications for mental depression Other medications used to treat angina Phenothiazines like chlorpromazine, mesoridazine, prochlorperazine, thioridazine This list may not describe all possible interactions. Give your health care provider a list of all the medicines, herbs, non-prescription drugs, or dietary supplements you use. Also tell them if you smoke, drink alcohol, or use illegal drugs. Some items may interact with your medicine. What should I watch for while using this medication? Tell your care team if you feel your medication is no longer working. Keep this medication with you at all times. Sit or lie down when you take your medication to prevent falling if you feel dizzy or faint after using it. Try to remain calm. This will help you to feel better faster. If you feel dizzy, take several deep breaths and lie down with your feet propped up, or bend forward with your head resting between your knees. You may get drowsy or dizzy. Do not drive, use machinery, or do anything that needs mental alertness until you know how this medication affects you. Do not stand or sit up quickly, especially if you are an older patient. This reduces the risk of dizzy or fainting spells. Alcohol can make you more drowsy and dizzy.  Avoid alcoholic drinks. Do not treat yourself for coughs, colds, or pain while you are taking this medication without asking your care team for advice. Some ingredients may increase your blood pressure. What side effects may I notice from receiving this medication? Side effects that you should report to your care team as soon as possible: Allergic reactions--skin rash, itching, hives, swelling of the face, lips, tongue, or throat Headache, unusual weakness or fatigue, shortness of breath, nausea, vomiting, rapid heartbeat, blue skin or lips, which may be signs of methemoglobinemia Increased pressure around the brain--severe headache, blurry vision, change in vision, nausea, vomiting Low blood pressure--dizziness, feeling faint or lightheaded, blurry vision Slow heartbeat--dizziness, feeling faint or lightheaded, confusion, trouble breathing, unusual weakness or fatigue Worsening chest pain (angina)--pain, pressure, or tightness in the chest, neck, back, or arms Side effects that usually do not require medical attention (report to your care team if they continue or are bothersome): Dizziness Flushing Headache This list may not describe all possible side effects. Call your doctor for medical advice about side effects. You may report side effects to FDA at 1-800-FDA-1088. Where should I keep my medication? Keep out of the reach of children. Store at room temperature between 20 and 25 degrees C (68 and 77 degrees F). Store in Chief of Staff. Protect from  light and moisture. Keep tightly closed. Throw away any unused medication after the expiration date. NOTE: This sheet is a summary. It may not cover all possible information. If you have questions about this medicine, talk to your doctor, pharmacist, or health care provider.  2022 Elsevier/Gold Standard (2020-08-15 00:00:00)     Signed, Candee Furbish, MD  07/24/2021 6:06 AM    [place]

## 2021-07-23 NOTE — Patient Instructions (Signed)
Medication Instructions:  The current medical regimen is effective;  continue present plan and medications.  *If you need a refill on your cardiac medications before your next appointment, please call your pharmacy*   Lab Work: Please have blood work today (BMP)  If you have labs (blood work) drawn today and your tests are completely normal, you will receive your results only by: MyChart Message (if you have MyChart) OR A paper copy in the mail If you have any lab test that is abnormal or we need to change your treatment, we will call you to review the results.   Testing/Procedures:   Your cardiac CT will be scheduled at:   [facility] [address] [place], [place] [address] [phone]  Please arrive at the [facility] and Children's Entrance (Entrance C2) of [facility] 30 minutes prior to test start time. You can use the FREE valet parking offered at entrance C (encouraged to control the heart rate for the test)  Proceed to the [facility] Radiology Department (first floor) to check-in and test prep.  All radiology patients and guests should use entrance C2 at [facility], accessed from [facility], even though the hospital's physical address listed is [address].    Please follow these instructions carefully (unless otherwise directed):  On the Night Before the Test: Be sure to Drink plenty of water. Do not consume any caffeinated/decaffeinated beverages or chocolate 12 hours prior to your test. Do not take any antihistamines 12 hours prior to your test.  On the Day of the Test: Drink plenty of water until 1 hour prior to the test. Do not eat any food 4 hours prior to the test. You may take your regular medications prior to the test.  Take metoprolol (Lopressor) two hours prior to test. HOLD Furosemide/Hydrochlorothiazide morning of the test. FEMALES- please wear underwire-free bra if available, avoid dresses &  tight clothing  After the Test: Drink plenty of water. After receiving IV contrast, you may experience a mild flushed feeling. This is normal. On occasion, you may experience a mild rash up to 24 hours after the test. This is not dangerous. If this occurs, you can take Benadryl 25 mg and increase your fluid intake. If you experience trouble breathing, this can be serious. If it is severe call 911 IMMEDIATELY. If it is mild, please call our office. If you take any of these medications: Glipizide/Metformin, Avandament, Glucavance, please do not take 48 hours after completing test unless otherwise instructed.  We will call to schedule your test 2-4 weeks out understanding that some insurance companies will need an authorization prior to the service being performed.   For non-scheduling related questions, please contact the cardiac imaging nurse navigator should you have any questions/concerns: Marchia Bond, Cardiac Imaging Nurse Navigator Gordy Clement, Cardiac Imaging Nurse Navigator [facility] Direct Office Dial: [phone]   For scheduling needs, including cancellations and rescheduling, please call [place], [phone].  Follow-Up: At [facility], you and your health needs are our priority.  As part of our continuing mission to provide you with exceptional heart care, we have created designated Provider Care Teams.  These Care Teams include your primary Cardiologist (physician) and Advanced Practice Providers (APPs -  Physician Assistants and Nurse Practitioners) who all work together to provide you with the care you need, when you need it.  We recommend signing up for the patient portal called "MyChart".  Sign up information is  provided on this After Visit Summary.  MyChart is used to connect with patients for Virtual Visits (Telemedicine).  Patients are able to view lab/test results, encounter notes, upcoming appointments, etc.  Non-urgent messages can be  sent to your provider as well.   To learn more about what you can do with MyChart, go to [email or web address].    Your next appointment:   6 month(s)  The format for your next appointment:   In Person  Provider:   Candee Furbish, MD     Thank you for choosing [facility]!!    Nitroglycerin Sublingual Tablets What is this medication? NITROGLYCERIN (nye troe GLI ser in) prevents and treats chest pain (angina). It works by relaxing blood vessels, which decreases the amount of work the heart has to do. It belongs to a group of medications called nitrates. This medicine may be used for other purposes; ask your health care provider or pharmacist if you have questions. COMMON BRAND NAME(S): Nitroquick, Nitrostat, Nitrotab What should I tell my care team before I take this medication? They need to know if you have any of these conditions: Anemia Head injury, recent stroke, or bleeding in the brain Liver disease Previous heart attack An unusual or allergic reaction to nitroglycerin, other medications, foods, dyes, or preservatives Pregnant or trying to get pregnant Breast-feeding How should I use this medication? Take this medication by mouth as needed. Use at the first sign of an angina attack (chest pain or tightness). You can also take this medication 5 to 10 minutes before an event likely to produce chest pain. Follow the directions exactly as written on the prescription label. Place one tablet under your tongue and let it dissolve. Do not swallow whole. Replace the dose if you accidentally swallow it. It will help if your mouth is not dry. Saliva around the tablet will help it to dissolve more quickly. Do not eat or drink, smoke or chew tobacco while a tablet is dissolving. Sit down when taking this medication. In an angina attack, you should feel better within 5 minutes after your first dose. You can take a dose every 5 minutes up to a total of 3 doses. If you do not feel  better or feel worse after 1 dose, call 9-1-1 at once. Do not take more than 3 doses in 15 minutes. Your care team might give you other directions. Follow those directions if they do. Do not take your medication more often than directed. Talk to your care team about the use of this medication in children. Special care may be needed. Overdosage: If you think you have taken too much of this medicine contact a poison control center or emergency room at once. NOTE: This medicine is only for you. Do not share this medicine with others. What if I miss a dose? This does not apply. This medication is only used as needed. What may interact with this medication? Do not take this medication with any of the following: Certain migraine medications like ergotamine and dihydroergotamine (DHE) Medications used to treat erectile dysfunction like sildenafil, tadalafil, and vardenafil Riociguat This medication may also interact with the following: Alteplase Aspirin Heparin Medications for high blood pressure Medications for mental depression Other medications used to treat angina Phenothiazines like chlorpromazine, mesoridazine, prochlorperazine, thioridazine This list may not describe all possible interactions. Give your health care provider a list of all the medicines, herbs, non-prescription drugs, or dietary supplements you use. Also tell them if you smoke, drink alcohol, or use  illegal drugs. Some items may interact with your medicine. What should I watch for while using this medication? Tell your care team if you feel your medication is no longer working. Keep this medication with you at all times. Sit or lie down when you take your medication to prevent falling if you feel dizzy or faint after using it. Try to remain calm. This will help you to feel better faster. If you feel dizzy, take several deep breaths and lie down with your feet propped up, or bend forward with your head resting between your  knees. You may get drowsy or dizzy. Do not drive, use machinery, or do anything that needs mental alertness until you know how this medication affects you. Do not stand or sit up quickly, especially if you are an older patient. This reduces the risk of dizzy or fainting spells. Alcohol can make you more drowsy and dizzy. Avoid alcoholic drinks. Do not treat yourself for coughs, colds, or pain while you are taking this medication without asking your care team for advice. Some ingredients may increase your blood pressure. What side effects may I notice from receiving this medication? Side effects that you should report to your care team as soon as possible: Allergic reactions--skin rash, itching, hives, swelling of the face, lips, tongue, or throat Headache, unusual weakness or fatigue, shortness of breath, nausea, vomiting, rapid heartbeat, blue skin or lips, which may be signs of methemoglobinemia Increased pressure around the brain--severe headache, blurry vision, change in vision, nausea, vomiting Low blood pressure--dizziness, feeling faint or lightheaded, blurry vision Slow heartbeat--dizziness, feeling faint or lightheaded, confusion, trouble breathing, unusual weakness or fatigue Worsening chest pain (angina)--pain, pressure, or tightness in the chest, neck, back, or arms Side effects that usually do not require medical attention (report to your care team if they continue or are bothersome): Dizziness Flushing Headache This list may not describe all possible side effects. Call your doctor for medical advice about side effects. You may report side effects to FDA at 1-800-FDA-1088. Where should I keep my medication? Keep out of the reach of children. Store at room temperature between 20 and 25 degrees C (68 and 77 degrees F). Store in [facility] of [employer]. Protect from light and moisture. Keep tightly closed. Throw away any unused medication after the expiration date. NOTE: This sheet is a  summary. It may not cover all possible information. If you have questions about this medicine, talk to your doctor, pharmacist, or health care provider.  2022 Elsevier/Gold Standard (2020-08-15 00:00:00)

## 2021-07-23 NOTE — Progress Notes (Signed)
error 

## 2021-07-23 NOTE — Assessment & Plan Note (Signed)
If cardiac work-up is stable, she will see Dr. Ardis Hughs. ?

## 2021-07-24 ENCOUNTER — Telehealth (HOSPITAL_COMMUNITY): Payer: Self-pay | Admitting: Emergency Medicine

## 2021-07-24 LAB — BASIC METABOLIC PANEL
BUN/Creatinine Ratio: 9 — ABNORMAL LOW (ref 12–28)
BUN: 9 mg/dL (ref 8–27)
CO2: 26 mmol/L (ref 20–29)
Calcium: 9.9 mg/dL (ref 8.7–10.3)
Chloride: 103 mmol/L (ref 96–106)
Creatinine, Ser: 1.05 mg/dL — ABNORMAL HIGH (ref 0.57–1.00)
Glucose: 86 mg/dL (ref 70–99)
Potassium: 4.2 mmol/L (ref 3.5–5.2)
Sodium: 141 mmol/L (ref 134–144)
eGFR: 58 mL/min/{1.73_m2} — ABNORMAL LOW (ref >59)

## 2021-07-24 NOTE — Telephone Encounter (Signed)
Unable to leave vm Norbert Malkin RN Navigator Cardiac Imaging [facility] [phone] Office  [phone] Cell  

## 2021-07-25 ENCOUNTER — Other Ambulatory Visit: Payer: Self-pay

## 2021-07-25 ENCOUNTER — Encounter (HOSPITAL_COMMUNITY): Payer: Self-pay

## 2021-07-25 ENCOUNTER — Ambulatory Visit (HOSPITAL_COMMUNITY)
Admission: RE | Admit: 2021-07-25 | Discharge: 2021-07-25 | Disposition: A | Discharge status: Home / Self Care | Payer: Medicare Other | Source: Ambulatory Visit | Attending: Cardiology | Admitting: Cardiology

## 2021-07-25 DIAGNOSIS — R072 Precordial pain: Secondary | ICD-10-CM | POA: Insufficient documentation

## 2021-07-25 MED ORDER — IOHEXOL 350 MG/ML SOLN
100.0000 mL | Freq: Once | INTRAVENOUS | Status: AC | PRN
  Administered 2021-07-25: 100 mL via INTRAVENOUS

## 2021-07-25 MED ORDER — NITROGLYCERIN 0.4 MG SL SUBL
SUBLINGUAL_TABLET | SUBLINGUAL | Status: AC
  Filled 2021-07-25: qty 2

## 2021-07-25 MED ORDER — NITROGLYCERIN 0.4 MG SL SUBL
0.8000 mg | SUBLINGUAL_TABLET | Freq: Once | SUBLINGUAL | Status: AC
  Administered 2021-07-25: 0.8 mg via SUBLINGUAL

## 2021-07-26 ENCOUNTER — Telehealth: Payer: Self-pay | Admitting: Cardiology

## 2021-07-26 NOTE — Telephone Encounter (Signed)
Called the pt's husband and stated that the pt was approved to get the medication for free.  ?

## 2021-07-26 NOTE — Telephone Encounter (Signed)
Called and spoke w/pt's pharmacy and gave new healthwell information and the cost will be free to the pt ? ? ?PATIENT ?Dominique Mooney ?  ?STATUS  ?Active ?  ?START DATE ?07/08/2021 ?  ?END DATE ?07/07/2022 ?  ?ASSISTANCE TYPE ?Co-pay ?  ?PAID ?$0.00 ?  ?PENDING ?$0.00 ?  ?BALANCE ?$2500.00 ? ?Pharmacy Card: ? ?CARD NO. ?[id number] ?  ?CARD STATUS ?Active ?  ?BIN ?[id number] ?  ?PCN ?[id number] ?  ?PC GROUP ?[id number] ?  ?HELP DESK ?[phone] ? ?

## 2021-07-26 NOTE — Telephone Encounter (Signed)
Dominique Pain, MD  ?07/26/2021  6:33 AM EST   ?  ?1. Coronary calcium score of 659. This was 97th percentile for age ?and sex matched control. ?  ?2.  Normal coronary origin with right dominance. ?  ?3.  Nonobstructive CAD ?  ?4.  Calcified plaque in D1 causes mild (25-49%) stenosis ?  ?5. Calcified plaque in proximal and mid LAD, proximal and mid RCA, ?and proximal and distal LCX causes minimal (0-24%) stenosis. ?  ?Thankfully, the degree of plaque would not cause symptoms and do not warrant stenting. This is stable from prior heart cath. However, continue with lipid control. Would like LDL < 70, currently 85. Let's have her see the lipid clinic again to discuss.  ?Candee Furbish, MD  ?Relayed above results to patient along with recommendation to see lipid clinic again. Pt states that she has a very well-controlled diet "I only eat chicken breast and salmon steaks for meat." She denies consuming foods high in fat and/or fried foods. Pt says that she is still currently using her zetia and praluent and has not missed any doses, but does express that she needs to call [facility] before she can get her next refill on praluent due to pharmacy telling her it went up to over $400. Pt anticipates call from lipid clinic to schedule appt. ?

## 2021-07-26 NOTE — Telephone Encounter (Signed)
Can you see why her price went up significantly? ?

## 2021-07-26 NOTE — Telephone Encounter (Signed)
Patient calling for CT results. 

## 2021-07-28 ENCOUNTER — Other Ambulatory Visit: Payer: Self-pay | Admitting: Cardiology

## 2021-07-30 MED ORDER — METOPROLOL SUCCINATE ER 25 MG PO TB24: 25.0000 mg | ORAL_TABLET | Freq: Every day | ORAL | 3 refills | Status: DC

## 2021-07-30 NOTE — Addendum Note (Signed)
Addended by: Carter Kitten D on: 07/30/2021 04:46 PM ? ? Modules accepted: Orders ? ?

## 2021-08-06 NOTE — Progress Notes (Signed)
? ? ? ?08/06/2021 ?Dominique Mooney ?[id number] ?[birth year] ? ? ?Chief Complaint:  Acid reflux  ? 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 Past cholecystectomy.  She presents to our office today for further evaluation regarding acid reflux.  She occasionally feels acid back up into her esophagus but she is mostly concerned regarding her stomach gurgling noise.  No dysphagia.  No abdominal pain.  She is passing normal formed brown bowel movement most days.  No rectal bleeding or black stools.  She started eating a healthier diet 4 months ago, she eats chicken, salmon and vegetables.  She avoids fatty and spicy foods.  She walks and exercises at the [facility] on a regular basis.  She described having a few episodes of chest pain and she wasn't sure if her chest pain was due to to acid reflux or her heart.  She as seen by her cardiologist Dr .Marlou Porch 07/23/2021 she discussed having a few episodes of chest pain.  A coronary artery CT scan 07/25/2021 showed mild nonobstructive CAD which was stable when compared to the findings of her cardiac catheterization.  She was advised by Dr. Marlou Porch to follow-up with Dr. Ardis Hughs for further GI evaluation.  She remains on Nexium 40 mg daily.  She denies having any chest pain at this time.  No palpitations, dizziness or shortness of breath.  She underwent a EGD 01/11/2019 which showed gastritis, biopsies were negative for H. pylori.  The esophagus was normal.  She underwent a colonoscopy on the same date which identified a 4 mm hyperplastic polyp which was removed from the descending colon and left-sided diverticulosis. ? ?Coronary CT 07/25/2021: ?IMPRESSION: ?1. Coronary calcium score of 659. This was 97th percentile for age ?and sex matched control. ?  ?2.   Normal coronary origin with right dominance. ?  ?3.  Nonobstructive CAD ?  ?4.  Calcified plaque in D1 causes mild (25-49%) stenosis ?  ?5. Calcified plaque in proximal and mid LAD, proximal and mid RCA, ?and proximal and distal LCX causes minimal (0-24%) stenosis ?  ?CAD-RADS 2. Mild non-obstructive CAD (25-49%). Consider ?non-atherosclerotic causes of chest pain. Consider preventive ?therapy and risk factor modification. ? ?CBC Latest Ref Rng & Units 12/31/2019 12/08/2019 12/03/2019  ?WBC 4.0 - 10.5 K/uL 6.5 6.7 7.9  ?Hemoglobin 12.0 - 15.0 g/dL 12.5 12.3 12.9  ?Hematocrit 36.0 - 46.0 % 37.7 36.8 39.9  ?Platelets 150 - 400 K/uL 224 264.0 272  ?  ?CMP Latest Ref Rng & Units 07/23/2021 04/03/2021 02/19/2021  ?Glucose 70 - 99 mg/dL 86 - -  ?BUN 8 - 27 mg/dL 9 - -  ?Creatinine 0.57 - 1.00 mg/dL 1.05(H) - -  ?Sodium 134 - 144 mmol/L 141 - -  ?Potassium 3.5 - 5.2 mmol/L 4.2 - -  ?Chloride 96 - 106 mmol/L 103 - -  ?CO2 20 - 29 mmol/L 26 - -  ?Calcium 8.7 - 10.3 mg/dL 9.9 - -  ?Total Protein 6.0 - 8.5 g/dL - 7.3 7.0  ?Total Bilirubin 0.0 - 1.2 mg/dL - 0.6 0.7  ?Alkaline Phos 44 - 121 IU/L - 111 119  ?AST 0 - 40 IU/L - 28 25  ?ALT 0 - 32 IU/L - 6 6  ?  ?EGD 01/11/2019: ?- Gastritis. Biopsied to check for H. pylori. ?- The  examination was otherwise normal. ? ?Colonoscopy 01/11/2019: ?One 4 mm polyp in the descending colon, removed with a cold snare. Resected and ?retrieved. ?- Diverticulosis in the left colon. ?- The examination was otherwise normal on direct and retroflexion views. ?- 10 year colonoscopy recall  ?1. Surgical [P], colon, descending, polyp ?- HYPERPLASTIC POLYP (1 OF 1 FRAGMENTS) ?- NO HIGH GRADE DYSPLASIA OR MALIGNANCY IDENTIFIED ?2. Surgical [P], gastric antrum and body ?- REACTIVE GASTROPATHY ?- NO H. PYLORI OR INTESTINAL METAPLASIA IDENTIFIED ?- SEE COMMENT ? ?Current Outpatient Medications on File Prior to Visit  ?Medication Sig Dispense Refill  ? acetaminophen (TYLENOL) 500 MG tablet Take 1,000 mg by mouth  every 6 (six) hours as needed for mild pain.     ? albuterol (PROVENTIL HFA) 108 (90 Base) MCG/ACT inhaler INHALE 2 PUFFS INTO THE LUNGS EVERY 6 (SIX) HOURS AS NEEDED. 1 Inhaler 3  ? aspirin 81 MG EC tablet Take 81 mg by mouth daily.    ? budesonide-formoterol (SYMBICORT) 80-4.5 MCG/ACT inhaler Inhale 2 puffs into the lungs 2 (two) times daily. 1 Inhaler 3  ? cetirizine (ZYRTEC) 10 MG tablet Take 10 mg by mouth daily as needed.     ? fish oil-omega-3 fatty acids 1000 MG capsule Take 1 g by mouth daily.     ? fluticasone (FLONASE) 50 MCG/ACT nasal spray Place 2 sprays into both nostrils daily. 16 g 2  ? levothyroxine (SYNTHROID) 75 MCG tablet Take 75 mcg by mouth daily.    ? metoprolol succinate (TOPROL-XL) 25 MG 24 hr tablet Take 1 tablet (25 mg total) by mouth daily. 90 tablet 3  ? Multiple Vitamin (MULITIVITAMIN WITH MINERALS) TABS Take 1 tablet by mouth daily.     ? PRALUENT 150 MG/ML SOAJ INJECT 1 PEN INTO THE SKIN EVERY 14 (FOURTEEN) DAYS. 6 mL 3  ? ?No current facility-administered medications on file prior to visit.  ? ?Allergies  ?Allergen Reactions  ? Statins Other (See Comments)  ?  Elevated Liver functions  ? Penicillins Rash  ? Atorvastatin Other (See Comments)  ?  Abdominal bloating  ? ?Current Medications, Allergies, Past Medical History, Past Surgical History, Family History and Social History were reviewed in [employer] record. ? ?Review of Systems:   ?Constitutional: Negative for fever, sweats, chills or weight loss.  ?Respiratory: Negative for shortness of breath.   ?Cardiovascular: See HPI.  No leg swelling. ?Gastrointestinal: See HPI.  ?Musculoskeletal: Negative for back pain or muscle aches.  ?Neurological: Negative for dizziness, headaches or paresthesias.  ? ?Physical Exam: ?BP 110/70   Pulse 71   Ht '5\' 1"'$  (1.549 m)   Wt 152 lb (68.9 kg)   BMI 28.72 kg/m?  ? ?Wt Readings from Last 3 Encounters:  ?08/07/21 152 lb (68.9 kg)  ?07/23/21 152 lb 12.8 oz (69.3 kg)   ?01/30/21 167 lb (75.8 kg)  ?  ? ?General: 71 year old female in no acute distress. ?Head: Normocephalic and atraumatic. ?Eyes: No scleral icterus. Conjunctiva pink . ?Ears: Normal auditory acuity. ?Mouth: Dentition intact. No ulcers or lesions.  ?Lungs: Clear throughout to auscultation. ?Heart: Regular rate and rhythm, no murmur. ?Abdomen: Soft, nontender and nondistended. No masses or hepatomegaly. Normal bowel sounds x 4 quadrants.  ?Rectal: Deferred.  ?Musculoskeletal: Symmetrical with no gross deformities. ?Extremities: No edema. ?Neurological: Alert oriented x 4. No focal deficits.  ?Psychological: Alert and cooperative. Normal mood and affect ? ?Assessment and Recommendations: ? ?10) 71 year old female with GERD, atypical chest pain.  Evaluated by  cardiology who felt her atypical chest pain was unlikely cardiac in etiology and further GI evaluation was recommended.  EGD 12/2018 showed gastritis without esophageal abnormalities.  No nausea or vomiting.  Past post ERCP pancreatitis and s/p cholecystectomy 2000. ?-Stop Esomeprazole ?-Start Pantoprazole 40 mg 1 p.o. twice daily ?-Famotidine 20 mg 1 p.o. nightly as needed ?-If no improvement will schedule EGD in 2 weeks. EGD benefits and risks discussed including risk with sedation, risk of bleeding, perforation and infection  ?-Patient already follows a GERD diet ?-CBC, CMP. TSH (as requested by the patient) ? ?2) Nonobstructive CAD, stable.  Recent atypical chest pain noncardiac per cardiologist Dr. Marlou Porch. ? ?3) Hyperplastic polyp per colonoscopy 12/2018 ?-Next colonoscopy due 12/2028 ? ?

## 2021-08-07 ENCOUNTER — Encounter: Payer: Self-pay | Admitting: Nurse Practitioner

## 2021-08-07 ENCOUNTER — Ambulatory Visit (INDEPENDENT_AMBULATORY_CARE_PROVIDER_SITE_OTHER): Payer: Medicare Other | Admitting: Nurse Practitioner

## 2021-08-07 ENCOUNTER — Other Ambulatory Visit (INDEPENDENT_AMBULATORY_CARE_PROVIDER_SITE_OTHER): Payer: Medicare Other

## 2021-08-07 VITALS — BP 110/70 | HR 71 | Ht 61.0 in | Wt 152.0 lb

## 2021-08-07 DIAGNOSIS — K219 Gastro-esophageal reflux disease without esophagitis: Secondary | ICD-10-CM

## 2021-08-07 DIAGNOSIS — E039 Hypothyroidism, unspecified: Secondary | ICD-10-CM

## 2021-08-07 LAB — CBC WITH DIFFERENTIAL/PLATELET
Basophils Absolute: 0.1 10*3/uL (ref 0.0–0.1)
Basophils Relative: 1.2 % (ref 0.0–3.0)
Eosinophils Absolute: 0 10*3/uL (ref 0.0–0.7)
Eosinophils Relative: 1.1 % (ref 0.0–5.0)
HCT: 38 % (ref 36.0–46.0)
Hemoglobin: 12.7 g/dL (ref 12.0–15.0)
Lymphocytes Relative: 34.9 % (ref 12.0–46.0)
Lymphs Abs: 1.5 10*3/uL (ref 0.7–4.0)
MCHC: 33.4 g/dL (ref 30.0–36.0)
MCV: 93 fl (ref 78.0–100.0)
Monocytes Absolute: 0.4 10*3/uL (ref 0.1–1.0)
Monocytes Relative: 10.2 % (ref 3.0–12.0)
Neutro Abs: 2.3 10*3/uL (ref 1.4–7.7)
Neutrophils Relative %: 52.6 % (ref 43.0–77.0)
Platelets: 209 10*3/uL (ref 150.0–400.0)
RBC: 4.09 Mil/uL (ref 3.87–5.11)
RDW: 13.1 % (ref 11.5–15.5)
WBC: 4.3 10*3/uL (ref 4.0–10.5)

## 2021-08-07 LAB — COMPREHENSIVE METABOLIC PANEL
ALT: 5 U/L (ref 0–35)
AST: 23 U/L (ref 0–37)
Albumin: 4 g/dL (ref 3.5–5.2)
Alkaline Phosphatase: 82 U/L (ref 39–117)
BUN: 9 mg/dL (ref 6–23)
CO2: 28 mEq/L (ref 19–32)
Calcium: 9.2 mg/dL (ref 8.4–10.5)
Chloride: 104 mEq/L (ref 96–112)
Creatinine, Ser: 1.03 mg/dL (ref 0.40–1.20)
GFR: 55.86 mL/min — ABNORMAL LOW (ref >60.00)
Glucose, Bld: 98 mg/dL (ref 70–99)
Potassium: 3.8 mEq/L (ref 3.5–5.1)
Sodium: 141 mEq/L (ref 135–145)
Total Bilirubin: 0.6 mg/dL (ref 0.2–1.2)
Total Protein: 7.2 g/dL (ref 6.0–8.3)

## 2021-08-07 LAB — TSH: TSH: 17.42 u[IU]/mL — ABNORMAL HIGH (ref 0.35–5.50)

## 2021-08-07 MED ORDER — PANTOPRAZOLE SODIUM 40 MG PO TBEC: 40.0000 mg | DELAYED_RELEASE_TABLET | Freq: Two times a day (BID) | ORAL | 1 refills | Status: DC

## 2021-08-07 MED ORDER — FAMOTIDINE 20 MG PO TABS: 20.0000 mg | ORAL_TABLET | Freq: Every evening | ORAL | 1 refills | Status: DC | PRN

## 2021-08-07 NOTE — Patient Instructions (Addendum)
Please proceed to the basement level for lab work before leaving today. Press "B" on the elevator. The lab is located at the first door on the left as you exit the elevator. ? ?RECOMMENDATIONS: ? ?Stop Nexium. ?Start Pantoprazole 40 MG, take 1 twice a day, 30 minutes before breakfast and dinner. We sent this to your pharmacy. ?You can take Famotidine 20 mg tablet before bed as needed. We sent this to the pharamcy. ?Call us in 2 weeks if not improving so we can scheduled EGD. ? ?Thank you for trusting me with your gastrointestinal care!   ? ?Noralyn Pick, CRNP ? ? ? ?BMI: ? ?If you are age 50 or older, your body mass index should be between 23-30. Your Body mass index is 28.72 kg/m?Marland Kitchen If this is out of the aforementioned range listed, please consider follow up with your Primary Care Provider. ? ?If you are age 56 or younger, your body mass index should be between 19-25. Your Body mass index is 28.72 kg/m?Marland Kitchen If this is out of the aformentioned range listed, please consider follow up with your Primary Care Provider.  ? ?MY CHART: ? ?The [place] [facility] providers would like to encourage you to use [facility] to communicate with providers for non-urgent requests or questions.  Due to long hold times on the telephone, sending your provider a message by [facility] may be a faster and more efficient way to get a response.  Please allow 48 business hours for a response.  Please remember that this is for non-urgent requests.  ? ?

## 2021-08-08 NOTE — Progress Notes (Signed)
I agree with the above note, plan 

## 2021-08-09 DIAGNOSIS — I25118 Atherosclerotic heart disease of native coronary artery with other forms of angina pectoris: Secondary | ICD-10-CM | POA: Diagnosis not present

## 2021-08-09 DIAGNOSIS — K219 Gastro-esophageal reflux disease without esophagitis: Secondary | ICD-10-CM | POA: Diagnosis not present

## 2021-08-14 ENCOUNTER — Other Ambulatory Visit: Payer: Self-pay | Admitting: Nurse Practitioner

## 2021-08-17 ENCOUNTER — Encounter (HOSPITAL_COMMUNITY): Payer: Self-pay

## 2021-08-17 ENCOUNTER — Other Ambulatory Visit: Payer: Self-pay

## 2021-08-17 ENCOUNTER — Telehealth: Payer: Self-pay | Admitting: Nurse Practitioner

## 2021-08-17 ENCOUNTER — Emergency Department (HOSPITAL_COMMUNITY)
Admission: EM | Admit: 2021-08-17 | Discharge: 2021-08-17 | Disposition: A | Discharge status: Home / Self Care | Payer: Medicare Other | Attending: Emergency Medicine | Admitting: Emergency Medicine

## 2021-08-17 ENCOUNTER — Emergency Department (HOSPITAL_COMMUNITY): Payer: Medicare Other

## 2021-08-17 DIAGNOSIS — Z79899 Other long term (current) drug therapy: Secondary | ICD-10-CM | POA: Insufficient documentation

## 2021-08-17 DIAGNOSIS — Z7982 Long term (current) use of aspirin: Secondary | ICD-10-CM | POA: Diagnosis not present

## 2021-08-17 DIAGNOSIS — R519 Headache, unspecified: Secondary | ICD-10-CM | POA: Diagnosis not present

## 2021-08-17 DIAGNOSIS — I10 Essential (primary) hypertension: Secondary | ICD-10-CM | POA: Insufficient documentation

## 2021-08-17 DIAGNOSIS — R109 Unspecified abdominal pain: Secondary | ICD-10-CM | POA: Insufficient documentation

## 2021-08-17 LAB — BASIC METABOLIC PANEL
Anion gap: 7 (ref 5–15)
BUN: 13 mg/dL (ref 8–23)
CO2: 27 mmol/L (ref 22–32)
Calcium: 8.8 mg/dL — ABNORMAL LOW (ref 8.9–10.3)
Chloride: 106 mmol/L (ref 98–111)
Creatinine, Ser: 1.13 mg/dL — ABNORMAL HIGH (ref 0.44–1.00)
GFR, Estimated: 53 mL/min — ABNORMAL LOW (ref >60)
Glucose, Bld: 95 mg/dL (ref 70–99)
Potassium: 3.5 mmol/L (ref 3.5–5.1)
Sodium: 140 mmol/L (ref 135–145)

## 2021-08-17 LAB — CBC WITH DIFFERENTIAL/PLATELET
Abs Immature Granulocytes: 0 10*3/uL (ref 0.00–0.07)
Basophils Absolute: 0 10*3/uL (ref 0.0–0.1)
Basophils Relative: 1 %
Eosinophils Absolute: 0 10*3/uL (ref 0.0–0.5)
Eosinophils Relative: 0 %
HCT: 39.1 % (ref 36.0–46.0)
Hemoglobin: 12.9 g/dL (ref 12.0–15.0)
Immature Granulocytes: 0 %
Lymphocytes Relative: 46 %
Lymphs Abs: 1.6 10*3/uL (ref 0.7–4.0)
MCH: 30.6 pg (ref 26.0–34.0)
MCHC: 33 g/dL (ref 30.0–36.0)
MCV: 92.9 fL (ref 80.0–100.0)
Monocytes Absolute: 0.5 10*3/uL (ref 0.1–1.0)
Monocytes Relative: 14 %
Neutro Abs: 1.4 10*3/uL — ABNORMAL LOW (ref 1.7–7.7)
Neutrophils Relative %: 39 %
Platelets: 207 10*3/uL (ref 150–400)
RBC: 4.21 MIL/uL (ref 3.87–5.11)
RDW: 12.6 % (ref 11.5–15.5)
WBC: 3.5 10*3/uL — ABNORMAL LOW (ref 4.0–10.5)
nRBC: 0 % (ref 0.0–0.2)

## 2021-08-17 NOTE — Telephone Encounter (Signed)
Inbound call from patient stating that she is not any better and today feels like she has a lot of gas and pain in her stomach. Seeking advice as to what she can take to help with the pain. Please advise.  ?

## 2021-08-17 NOTE — ED Provider Notes (Signed)
?[facility] DEPT ?Provider Note ? ? ?CSN: [id number] ?Arrival date & time: 08/17/21  1456 ? ?  ? ?History ? ?Chief Complaint  ?Patient presents with  ? Abdominal Pain  ? Headache  ? ? ?Dominique Mooney is a 71 y.o. female. ? ?HPI ? ?71 year old female with past medical history of HTN, HLD, GERD, IBS presents emergency department after an episode of "abdominal gurgling" and headache.  Patient states when she wakes up in the morning she typically has mild upset stomach, gurgling secondary to GERD/IBS.  However this morning her symptoms seemed more pronounced.  She states that her stomach was rumbling and tumbling "like a washing machine".  This lasted for couple minutes and then stopped and she felt like the rumbling went straight up her chest to her head.  She complained of a headache in a bandlike fashion that lasted a couple minutes and then resolved.  Patient states symptoms started around 9 AM this morning.  Currently her symptoms have resolved.  No chest pain.  No severe headache, no associated neuro complaints.  No nausea/vomiting/diarrhea.  No abdominal pain. ? ?Home Medications ?Prior to Admission medications   ?Medication Sig Start Date End Date Taking? Authorizing Provider  ?pantoprazole (PROTONIX) 40 MG tablet TAKE 1 TABLET (40 MG TOTAL) BY MOUTH 2 (TWO) TIMES DAILY BEFORE A MEAL. TAKE 30 MINUTES BEFORE DINNER AND BREAKFAST 08/14/21   Noralyn Pick, NP  ?acetaminophen (TYLENOL) 500 MG tablet Take 1,000 mg by mouth every 6 (six) hours as needed for mild pain.     [provider]  ?albuterol (PROVENTIL HFA) 108 (90 Base) MCG/ACT inhaler INHALE 2 PUFFS INTO THE LUNGS EVERY 6 (SIX) HOURS AS NEEDED. 10/20/18   Chesley Mires, MD  ?aspirin 81 MG EC tablet Take 81 mg by mouth daily.    [provider]  ?budesonide-formoterol (SYMBICORT) 80-4.5 MCG/ACT inhaler Inhale 2 puffs into the lungs 2 (two) times daily. 05/07/17   Parrett, Fonnie Mu, NP  ?cetirizine (ZYRTEC)  10 MG tablet Take 10 mg by mouth daily as needed.     [provider]  ?famotidine (PEPCID) 20 MG tablet Take 1 tablet (20 mg total) by mouth at bedtime as needed for heartburn or indigestion. 08/07/21   Noralyn Pick, NP  ?fish oil-omega-3 fatty acids 1000 MG capsule Take 1 g by mouth daily.     [provider]  ?fluticasone (FLONASE) 50 MCG/ACT nasal spray Place 2 sprays into both nostrils daily. 09/20/13   Chesley Mires, MD  ?levothyroxine (SYNTHROID) 75 MCG tablet Take 75 mcg by mouth daily. 12/20/19   [provider]  ?metoprolol succinate (TOPROL-XL) 25 MG 24 hr tablet Take 1 tablet (25 mg total) by mouth daily. 07/30/21   Jerline Pain, MD  ?Multiple Vitamin (MULITIVITAMIN WITH MINERALS) TABS Take 1 tablet by mouth daily.     [provider]  ?PRALUENT 150 MG/ML SOAJ INJECT 1 PEN INTO THE SKIN EVERY 14 (FOURTEEN) DAYS. 04/16/21   Jerline Pain, MD  ?   ? ?Allergies    ?Statins, Penicillins, and Atorvastatin   ? ?Review of Systems   ?Review of Systems  ?Constitutional:  Negative for fever.  ?Respiratory:  Negative for shortness of breath.   ?Cardiovascular:  Negative for chest pain.  ?Gastrointestinal:  Positive for abdominal pain. Negative for diarrhea and vomiting.  ?Skin:  Negative for rash.  ?Neurological:  Positive for dizziness and headaches. Negative for seizures, syncope, speech difficulty and weakness.  ? ?Physical  Exam ?Updated Vital Signs ?BP 131/86 (BP Location: Left Arm)   Pulse (!) 55   Temp 98.4 ?F (36.9 ?C) (Oral)   Resp 18   Ht '5\' 1"'$  (1.549 m)   Wt 68.9 kg   SpO2 100%   BMI 28.72 kg/m?  ?Physical Exam ?Vitals and nursing note reviewed.  ?Constitutional:   ?   General: She is not in acute distress. ?   Appearance: Normal appearance. She is not diaphoretic.  ?HENT:  ?   Head: Normocephalic.  ?   Mouth/Throat:  ?   Mouth: Mucous membranes are moist.  ?Cardiovascular:  ?   Rate and Rhythm: Normal rate.  ?Pulmonary:  ?   Effort: Pulmonary effort is  normal. No respiratory distress.  ?Abdominal:  ?   Palpations: Abdomen is soft.  ?   Tenderness: There is no abdominal tenderness. There is no guarding or rebound.  ?Skin: ?   General: Skin is warm.  ?Neurological:  ?   Mental Status: She is alert and oriented to person, place, and time. Mental status is at baseline.  ?Psychiatric:     ?   Mood and Affect: Mood normal.  ? ? ?ED Results / Procedures / Treatments   ?Labs ?(all labs ordered are listed, but only abnormal results are displayed) ?Labs Reviewed  ?BASIC METABOLIC PANEL - Abnormal; Notable for the following components:  ?    Result Value  ? Creatinine, Ser 1.13 (*)   ? Calcium 8.8 (*)   ? GFR, Estimated 53 (*)   ? All other components within normal limits  ?CBC WITH DIFFERENTIAL/PLATELET - Abnormal; Notable for the following components:  ? WBC 3.5 (*)   ? Neutro Abs 1.4 (*)   ? All other components within normal limits  ? ? ?EKG ?EKG Interpretation ? ?Date/Time:  Friday August 17 2021 21:24:14 EDT ?Ventricular Rate:  53 ?PR Interval:  137 ?QRS Duration: 83 ?QT Interval:  413 ?QTC Calculation: 388 ?R Axis:   49 ?Text Interpretation: Sinus rhythm Ventricular premature complex Borderline T wave abnormalities No change Confirmed by Lavenia Atlas [phone]) on 08/17/2021 9:37:19 PM ? ?Radiology ?CT Head Wo Contrast ? ?Result Date: 08/17/2021 ?CLINICAL DATA:  Headache EXAM: CT HEAD WITHOUT CONTRAST TECHNIQUE: Contiguous axial images were obtained from the base of the skull through the vertex without intravenous contrast. RADIATION DOSE REDUCTION: This exam was performed according to the departmental dose-optimization program which includes automated exposure control, adjustment of the mA and/or kV according to patient size and/or use of iterative reconstruction technique. COMPARISON:  CT head 02/11/2014 FINDINGS: Brain: No acute intracranial hemorrhage, mass effect, or herniation. No extra-axial fluid collections. No evidence of acute territorial infarct. No  hydrocephalus. Vascular: No hyperdense vessel or unexpected calcification. Skull: Normal. Negative for fracture or focal lesion. Sinuses/Orbits: No acute finding. Other: None. IMPRESSION: No acute intracranial process identified. Electronically Signed   By: Ofilia Neas M.D.   On: 08/17/2021 16:25   ? ?Procedures ?Procedures  ? ? ?Medications Ordered in ED ?Medications - No data to display ? ?ED Course/ Medical Decision Making/ A&P ?  ?                        ?Medical Decision Making ? ?71 year old female presents emergency department after "rumbling and tumbling" in her stomach this morning associated with a bandlike headache.  All of the symptoms have resolved.  Symptoms happened about 7 to 8 hours prior to her arrival here.  No active  chest pain or other symptoms. ? ?In regards to the headache, CT was done on her arrival, approximately 7 to 8 hours since her headache started.  This was unremarkable, very low suspicion for [facility].  Abdomen is nontender benign.  Blood work is reassuring without any acute unreality's.  EKG is unchanged.  Low suspicion for ACS.  No other signs of GI illness.  She is back to baseline. ? ?Vitals are normal, patient at this time appears safe and stable for discharge and close outpatient follow up. Discharge plan and strict return to ED precautions discussed, patient verbalizes understanding and agreement. ? ? ? ? ? ? ? ?Final Clinical Impression(s) / ED Diagnoses ?Final diagnoses:  ?Nonintractable headache, unspecified chronicity pattern, unspecified headache type  ? ? ?Rx / DC Orders ?ED Discharge Orders   ? ? None  ? ?  ? ? ?  ?Lorelle Gibbs, DO ?08/17/21 2231 ? ?

## 2021-08-17 NOTE — ED Triage Notes (Signed)
Patient reports that she had abdominal pain and then a "severe headache afterwards." Patient states she does not have abdominal pain at this time. ?Patient denies any blurred vision, light sensitivity, or nausea. ?

## 2021-08-17 NOTE — Discharge Instructions (Signed)
You have been seen and discharged from the emergency department.  Your EKG was unchanged for you.  Your blood work is normal.  The CT of your head showed no acute findings.  Unclear what caused your symptoms this morning, follow-up with your primary provider for further evaluation and further care. Take home medications as prescribed. If you have any worsening symptoms or further concerns for your health please return to an emergency department for further evaluation. ?

## 2021-08-17 NOTE — ED Provider Triage Note (Signed)
Emergency Medicine Provider Triage Evaluation Note ? ?Dominique Mooney , a 71 y.o. female  was evaluated in triage.  Pt complains of severe headache on the top of her head that started suddenly earlier this morning.  Patient states that her stomach was "tumbling around like a washing machine", and immediately after it stopped she suddenly developed a severe headache at the crown of her head.  This was approximately 8 hours prior to arrival and pain has been constant.  No treatment prior to arrival.  Currently without abdominal pain. ? ?Review of Systems  ?Positive: Headache ?Negative: Abdominal pain currently, blurred vision, nausea, vomiting ? ?Physical Exam  ?BP (!) 137/96 (BP Location: Left Arm)   Pulse (!) 105   Temp 98.4 ?F (36.9 ?C) (Oral)   Resp 18   Ht '5\' 1"'$  (1.549 m)   Wt 68.9 kg   SpO2 99%   BMI 28.72 kg/m?  ?Gen:   Awake, no distress   ?Resp:  Normal effort  ?MSK:   Moves extremities without difficulty  ?Other:  Eyes PERRL, EOM intact, strong and equal grip strength bilaterally, sensation intact bilateral ? ?Medical Decision Making  ?Medically screening exam initiated at 4:24 PM.  Appropriate orders placed.  [name] was informed that the remainder of the evaluation will be completed by another provider, this initial triage assessment does not replace that evaluation, and the importance of remaining in the ED until their evaluation is complete. ? ? ?  ?Tonye Pearson, [place] ?08/17/21 1626 ? ?

## 2021-08-17 NOTE — ED Notes (Signed)
Discharge instructions reviewed, questions answered. Rx education provided. Pt states understanding and no further questions. Pt ambulatory with steady gait upon discharge. No s/s of distress noted. ? ?

## 2021-08-19 NOTE — Telephone Encounter (Signed)
Dominique Mooney (nursing staff covering for Dominique Mooney, Ps contact patient and let her know I was out of the office when she called me 08/17/2021. She went to the ED 08/17/2021 due to having abd pain and a headache. ED evaluation was unrevealing. Pls call the patient for an update regarding her abdominal pain. THX ?

## 2021-08-20 NOTE — Telephone Encounter (Signed)
The pt states she is doing well today.  The abd pain comes and goes.  She has been scheduled for follow up on 4/24 with Colleen.  She will call if the pain returns and symptoms are worse or she develops new concerns  ?

## 2021-08-20 NOTE — Telephone Encounter (Signed)
Noted, update appreciated.  ?

## 2021-08-29 ENCOUNTER — Other Ambulatory Visit: Payer: Self-pay | Admitting: Nurse Practitioner

## 2021-09-07 DIAGNOSIS — K831 Obstruction of bile duct: Secondary | ICD-10-CM | POA: Insufficient documentation

## 2021-09-07 DIAGNOSIS — K754 Autoimmune hepatitis: Secondary | ICD-10-CM | POA: Insufficient documentation

## 2021-09-07 DIAGNOSIS — E1121 Type 2 diabetes mellitus with diabetic nephropathy: Secondary | ICD-10-CM | POA: Insufficient documentation

## 2021-09-07 DIAGNOSIS — N182 Chronic kidney disease, stage 2 (mild): Secondary | ICD-10-CM | POA: Insufficient documentation

## 2021-09-09 NOTE — Progress Notes (Signed)
? ? ? ?09/09/2021 ?Dominique Mooney ?[id number] ?1951-03-06 ? ? ?Chief Complaint: stomach gurgling  ? 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 Past cholecystectomy. ? ?I saw Dominique Mooney in the office 08/07/2021 for further evaluation regarding acid reflux.  At that time, she reported feeling acid back up into her esophagus and she was concerned about having a gurgling noise to her stomach.  Omeprazole was discontinued and she was started on pantoprazole 40 mg p.o. twice daily and Famotidine at bedtime.  08/17/2020 she developed recurrence of stomach rumbling described as a washing machine which radiated into her chest and head.  She presented to the ED for further evaluation.  A head CT and EKG were unrevealing. She was clinically stable and discharged home.  ? ?She presents today for further GI follow-up.  Her reflux and stomach gurgling symptoms continue to improve on Pantoprazole twice daily and famotidine nightly.  Describes feeling light reflux 3 days weekly.  No dysphagia.  She developed RUQ tenderness last week which was positional, worse when she twisted her torso from side to side and when sleeping on her right side.  Eating does not trigger her RUQ discomfort.  No nausea or vomiting.  No constipation.  She is taking MiraLAX daily which results in passing normal formed brown bowel movement daily.  Overall, she feels well.  She is eating a healthy diet and has intentionally lost weight over the past year.  She is unsure how much weight in total she has lost.  Her weight is down 4 pounds since her last office visit. ? ?She underwent a EGD 01/11/2019 which showed gastritis, biopsies were negative for H. pylori.  The esophagus was normal.  She underwent a colonoscopy on the  same date which identified a 4 mm hyperplastic polyp which was removed from the descending colon and left-sided diverticulosis. ? ?Liver biopsy 12/31/2019 (done due to elevated LFTs secondary to statin use) showed  features of acute or subacute hepatitis with foci of parenchymal collapse. No pathologic fibrosis. ? ?Labs 08/07/2021 showed normal LFTs. ? ? ?  Latest Ref Rng & Units 08/17/2021  ?  4:43 PM 08/07/2021  ? 10:54 AM 12/31/2019  ? 11:56 AM  ?CBC  ?WBC 4.0 - 10.5 K/uL 3.5   4.3   6.5    ?Hemoglobin 12.0 - 15.0 g/dL 12.9   12.7   12.5    ?Hematocrit 36.0 - 46.0 % 39.1   38.0   37.7    ?Platelets 150 - 400 K/uL 207   209.0   224    ?  ? ?  Latest Ref Rng & Units 08/17/2021  ?  4:43 PM 08/07/2021  ? 10:54 AM 07/23/2021  ?  4:57 PM  ?CMP  ?Glucose 70 - 99 mg/dL 95   98   86    ?BUN 8 - 23 mg/dL '13   9   9    '$ ?Creatinine 0.44 - 1.00 mg/dL 1.13   1.03   1.05    ?Sodium 135 - 145 mmol/L 140   141   141    ?Potassium 3.5 - 5.1 mmol/L 3.5   3.8   4.2    ?Chloride 98 - 111 mmol/L 106   104   103    ?CO2 22 - 32  mmol/L '27   28   26    '$ ?Calcium 8.9 - 10.3 mg/dL 8.8   9.2   9.9    ?Total Protein 6.0 - 8.3 g/dL  7.2     ?Total Bilirubin 0.2 - 1.2 mg/dL  0.6     ?Alkaline Phos 39 - 117 U/L  82     ?AST 0 - 37 U/L  23     ?ALT 0 - 35 U/L  5     ?  ?CTAP with contrast 07/31/2020: ?1. No findings to explain the patient's clinical history. ?2. Hepatic steatosis. ?3.  Aortic atherosclerosis  ? ?Current Outpatient Medications on File Prior to Visit  ?Medication Sig Dispense Refill  ? acetaminophen (TYLENOL) 500 MG tablet Take 1,000 mg by mouth every 6 (six) hours as needed for mild pain.     ? albuterol (PROVENTIL HFA) 108 (90 Base) MCG/ACT inhaler INHALE 2 PUFFS INTO THE LUNGS EVERY 6 (SIX) HOURS AS NEEDED. 1 Inhaler 3  ? aspirin 81 MG EC tablet Take 81 mg by mouth daily.    ? budesonide-formoterol (SYMBICORT) 80-4.5 MCG/ACT inhaler Inhale 2 puffs into the lungs 2 (two) times daily. 1 Inhaler 3  ? cetirizine (ZYRTEC) 10 MG tablet Take 10  mg by mouth daily as needed.     ? fish oil-omega-3 fatty acids 1000 MG capsule Take 1 g by mouth daily.     ? fluticasone (FLONASE) 50 MCG/ACT nasal spray Place 2 sprays into both nostrils daily. 16 g 2  ? levothyroxine (SYNTHROID) 75 MCG tablet Take 75 mcg by mouth daily.    ? metoprolol succinate (TOPROL-XL) 25 MG 24 hr tablet Take 1 tablet (25 mg total) by mouth daily. 90 tablet 3  ? Multiple Vitamin (MULITIVITAMIN WITH MINERALS) TABS Take 1 tablet by mouth daily.     ? PRALUENT 150 MG/ML SOAJ INJECT 1 PEN INTO THE SKIN EVERY 14 (FOURTEEN) DAYS. 6 mL 3  ? ?No current facility-administered medications on file prior to visit.  ? ?Allergies  ?Allergen Reactions  ? Statins Other (See Comments)  ?  Elevated Liver functions  ? Penicillins Rash  ? Lipitor [Atorvastatin] Other (See Comments)  ?  Abdominal bloating  ? ?Current Medications, Allergies, Past Medical History, Past Surgical History, Family History and Social History were reviewed in [employer] record. ? ?Review of Systems:   ?Constitutional: + Intentional weight loss. Negative for fever or sweats.  ?Respiratory: Negative for shortness of breath.   ?Cardiovascular: Negative for chest pain, palpitations and leg swelling.  ?Gastrointestinal: See HPI.  ?Musculoskeletal: Negative for back pain or muscle aches.  ?Neurological: Negative for dizziness, headaches or paresthesias.  ? ?Physical Exam: ?BP 120/78   Pulse (!) 52   Ht '5\' 1"'$  (1.549 m) Comment: measured without shoes  Wt 148 lb 2 oz (67.2 kg)   BMI 27.99 kg/m?  ? ?General: 71 year old female in no acute distress. ?Head: Normocephalic and atraumatic. ?Eyes: No scleral icterus. Conjunctiva pink . ?Ears: Normal auditory acuity. ?Mouth: Dentition intact. No ulcers or lesions.  ?Lungs: Clear throughout to auscultation. ?Heart: Regular rate and rhythm, no murmur. ?Chest: Mild tenderness to the right lateral rib cage.  ?Abdomen: Soft, nondistended.  Very mild epigastric tenderness without  rebound or guarding.  No masses or hepatomegaly. Normal bowel sounds x 4 quadrants.  ?Rectal: Deferred.  ?Musculoskeletal: Symmetrical with no gross deformities. ?Extremities: No edema. ?Neurological: Alert oriented x 4. No focal deficits.  ?Psychological: Alert and cooperative. Normal mood and affect ? ?  Assessment and Recommendations: ? ?46) 71 year old female with GERD and "stomach rumbling", improving on Pantoprazole 40 mg twice daily and Famotidine 20 mg Q HS. EGD 01/11/2019 showed gastritis, no evidence of H. Pylori.  ?-Continue Pantoprazole 40 mg p.o. twice daily and famotidine nightly ?-Schedule EGD in 4 weeks if symptoms persist or worsen ?-Dicyclomine 10 mg 1 p.o. twice daily as needed abdominal pain ?-IBgard 1 p.o. twice daily as needed abdominal pain, samples provided ?-Patient to call me in 4 weeks with an update ? ?2) RUQ tenderness, positional/musculoskeletal component.  No nausea or vomiting.  Normal LFTs 07/2021.  Past cholecystectomy. ?-RUQ sonogram to further evaluate the liver ? ?3) Hepatic steatosis.  ?-RUQ sono as ordered above.  ? ?4) Nonobstructive CAD ? ?5) Hyperplastic polyp per colonoscopy 12/2018 ?-Next colonoscopy due 12/2028 ? ? ? ?

## 2021-09-10 ENCOUNTER — Encounter: Payer: Self-pay | Admitting: Nurse Practitioner

## 2021-09-10 ENCOUNTER — Ambulatory Visit (INDEPENDENT_AMBULATORY_CARE_PROVIDER_SITE_OTHER): Payer: Medicare Other | Admitting: Nurse Practitioner

## 2021-09-10 VITALS — BP 120/78 | HR 52 | Ht 61.0 in | Wt 148.1 lb

## 2021-09-10 DIAGNOSIS — K219 Gastro-esophageal reflux disease without esophagitis: Secondary | ICD-10-CM

## 2021-09-10 DIAGNOSIS — R1011 Right upper quadrant pain: Secondary | ICD-10-CM | POA: Diagnosis not present

## 2021-09-10 MED ORDER — DICYCLOMINE HCL 10 MG PO CAPS: 10.0000 mg | ORAL_CAPSULE | Freq: Two times a day (BID) | ORAL | 0 refills | Status: DC | PRN

## 2021-09-10 MED ORDER — FAMOTIDINE 20 MG PO TABS: 20.0000 mg | ORAL_TABLET | Freq: Every evening | ORAL | 2 refills | Status: DC | PRN

## 2021-09-10 MED ORDER — PANTOPRAZOLE SODIUM 40 MG PO TBEC: 40.0000 mg | DELAYED_RELEASE_TABLET | Freq: Two times a day (BID) | ORAL | 2 refills | Status: DC

## 2021-09-10 NOTE — Patient Instructions (Signed)
RECOMMENDATIONS: ? ?Pantoprazole 40 MG, take 1 twice a day. We have sent this refill to the pharmacy. ?Famotidine 20 mg MG take 1 at bedtime. We have sent this in to the pharmacy. ?Dicyclomine 10 MG capsule, take 1 twice a day as needed for abdominal pain. This has been sent to the pharmacy. ?We have given you samples of the following medication to take: IBgard capsules, take 1 capsule twice a day as needed for abdominal pain. ?Contact us in 4 weeks to let us know how you are doing. ? ?You will be contacted by [facility] (Your caller ID will indicate phone # [phone]) in the next 7 days to schedule your Abdominal Ultrasound. If you have not heard from them within 7 business days, please call [facility] at [phone] to follow up on the status of your appointment.   ? ?Thank you for trusting me with your gastrointestinal care!   ? ?Noralyn Pick, CRNP ? ? ? ?BMI: ? ?If you are age 69 or older, your body mass index should be between 23-30. Your Body mass index is 27.99 kg/m?Marland Kitchen If this is out of the aforementioned range listed, please consider follow up with your Primary Care Provider. ? ?If you are age 55 or younger, your body mass index should be between 19-25. Your Body mass index is 27.99 kg/m?Marland Kitchen If this is out of the aformentioned range listed, please consider follow up with your Primary Care Provider.  ? ?MY CHART: ? ?The [facility] providers would like to encourage you to use [facility] to communicate with providers for non-urgent requests or questions.  Due to long hold times on the telephone, sending your provider a message by [facility] may be a faster and more efficient way to get a response.  Please allow 48 business hours for a response.  Please remember that this is for non-urgent requests.  ? ? ? ?

## 2021-09-10 NOTE — Progress Notes (Signed)
I agree with the above note, plan 

## 2021-09-17 DIAGNOSIS — E1122 Type 2 diabetes mellitus with diabetic chronic kidney disease: Secondary | ICD-10-CM | POA: Diagnosis not present

## 2021-09-17 DIAGNOSIS — Z0000 Encounter for general adult medical examination without abnormal findings: Secondary | ICD-10-CM | POA: Diagnosis not present

## 2021-09-17 DIAGNOSIS — E785 Hyperlipidemia, unspecified: Secondary | ICD-10-CM | POA: Diagnosis not present

## 2021-09-17 DIAGNOSIS — I10 Essential (primary) hypertension: Secondary | ICD-10-CM | POA: Diagnosis not present

## 2021-09-17 DIAGNOSIS — E559 Vitamin D deficiency, unspecified: Secondary | ICD-10-CM | POA: Diagnosis not present

## 2021-09-17 DIAGNOSIS — E039 Hypothyroidism, unspecified: Secondary | ICD-10-CM | POA: Diagnosis not present

## 2021-09-18 ENCOUNTER — Ambulatory Visit (HOSPITAL_COMMUNITY): Admission: RE | Admit: 2021-09-18 | Payer: Medicare Other | Source: Ambulatory Visit

## 2021-09-18 ENCOUNTER — Other Ambulatory Visit: Payer: Self-pay | Admitting: Nurse Practitioner

## 2021-09-20 DIAGNOSIS — I10 Essential (primary) hypertension: Secondary | ICD-10-CM | POA: Diagnosis not present

## 2021-09-20 DIAGNOSIS — Z0000 Encounter for general adult medical examination without abnormal findings: Secondary | ICD-10-CM | POA: Diagnosis not present

## 2021-09-24 ENCOUNTER — Ambulatory Visit (HOSPITAL_COMMUNITY)
Admission: RE | Admit: 2021-09-24 | Discharge: 2021-09-24 | Disposition: A | Discharge status: Home / Self Care | Payer: Medicare Other | Source: Ambulatory Visit | Attending: Nurse Practitioner | Admitting: Nurse Practitioner

## 2021-09-24 DIAGNOSIS — E1122 Type 2 diabetes mellitus with diabetic chronic kidney disease: Secondary | ICD-10-CM | POA: Diagnosis not present

## 2021-09-24 DIAGNOSIS — I10 Essential (primary) hypertension: Secondary | ICD-10-CM | POA: Diagnosis not present

## 2021-09-24 DIAGNOSIS — Z0000 Encounter for general adult medical examination without abnormal findings: Secondary | ICD-10-CM | POA: Diagnosis not present

## 2021-09-24 DIAGNOSIS — R1011 Right upper quadrant pain: Secondary | ICD-10-CM | POA: Diagnosis not present

## 2021-09-24 DIAGNOSIS — E785 Hyperlipidemia, unspecified: Secondary | ICD-10-CM | POA: Diagnosis not present

## 2021-09-24 DIAGNOSIS — Z23 Encounter for immunization: Secondary | ICD-10-CM | POA: Diagnosis not present

## 2021-09-24 DIAGNOSIS — K219 Gastro-esophageal reflux disease without esophagitis: Secondary | ICD-10-CM | POA: Diagnosis not present

## 2021-09-24 DIAGNOSIS — E119 Type 2 diabetes mellitus without complications: Secondary | ICD-10-CM | POA: Diagnosis not present

## 2021-09-24 DIAGNOSIS — E559 Vitamin D deficiency, unspecified: Secondary | ICD-10-CM | POA: Diagnosis not present

## 2021-09-24 DIAGNOSIS — I25118 Atherosclerotic heart disease of native coronary artery with other forms of angina pectoris: Secondary | ICD-10-CM | POA: Diagnosis not present

## 2021-09-24 DIAGNOSIS — E039 Hypothyroidism, unspecified: Secondary | ICD-10-CM | POA: Diagnosis not present

## 2021-09-24 DIAGNOSIS — K760 Fatty (change of) liver, not elsewhere classified: Secondary | ICD-10-CM | POA: Diagnosis not present

## 2021-09-25 DIAGNOSIS — E039 Hypothyroidism, unspecified: Secondary | ICD-10-CM | POA: Diagnosis not present

## 2021-09-28 ENCOUNTER — Telehealth: Payer: Self-pay | Admitting: Nurse Practitioner

## 2021-09-28 NOTE — Telephone Encounter (Signed)
Addressed in previously created encounter 

## 2021-09-28 NOTE — Telephone Encounter (Signed)
We received a call from patient requesting to speak with nurse regarding imaging results. Please advise. ?

## 2021-10-02 ENCOUNTER — Telehealth: Payer: Self-pay | Admitting: Nurse Practitioner

## 2021-10-02 NOTE — Telephone Encounter (Signed)
Returned pt call to inquire further. States she is having "horrible pain to her abd", feeling bloated and feels that her abd "hurts to touch it". Denies having any N/V or a fever. States the location of her abd pain is primarily in the RUQ. Denies having any symptoms of reflux. Confirmed she is having 1-2 lg stools daily without needing to strain to defecate. Denies rectal bleeding. States she is taking Pantoprazole 40 mg BID and famotidine QHS. Does also take Dicyclomine 10 mg as needed  for abd pain and Ibgard prn. States these medications do help to settle her reflux. [place] from 09/24/21 showed evidence of nonspecific heterogeneity, likely mild fatty liver. LFTS from 09/17/2021 were normal (T. Bili 0.3, Alk phos 102. AST 21 and ALT 10). Recommendations provided at the time [place] results received: reduce carbohydrates, exercise, and to lose weight to help reduce risk of developing fatty liver disease. Given the severity of her pain, advised she proceed to the ED for further evaluation and treatment. Routing to care team for continuity of care. ?

## 2021-10-02 NOTE — Telephone Encounter (Signed)
Inbound call from patient stating she is having abd pain and is seeking advice on what she can take to help with the pain. Patient was offered Colleen's next available  date and patient stated she could not wait that long. Please advise.  ?

## 2021-10-03 ENCOUNTER — Other Ambulatory Visit: Payer: Self-pay | Admitting: Nurse Practitioner

## 2021-10-21 ENCOUNTER — Other Ambulatory Visit: Payer: Self-pay | Admitting: Gastroenterology

## 2021-10-28 NOTE — Progress Notes (Unsigned)
10/28/2021 Dominique Mooney [id number] 03-30-1951   Chief Complaint:  History of Present Illness: Dominique Mooney. Dominique Mooney is a 71 year old female with a past medical history of hypertension, hyperlipidemia, nonobstructive coronary artery disease per cardiac cath in 2019, hypothyroidism, DILI secondary to statin, post ERCP pancreatitis 2022, GERD and colon polyps. Past cholecystectomy.  She underwent a EGD 01/11/2019 which showed gastritis, biopsies were negative for H. pylori.  The esophagus was normal.  She underwent a colonoscopy on the same date which identified a 4 mm hyperplastic polyp which was removed from the descending colon and left-sided diverticulosis.   Liver biopsy 12/31/2019 (done due to elevated LFTs secondary to statin use) showed features of acute or subacute hepatitis with foci of parenchymal collapse. No pathologic fibrosis.     Latest Ref Rng & Units 08/17/2021    4:43 PM 08/07/2021   10:54 AM 12/31/2019   11:56 AM  CBC  WBC 4.0 - 10.5 K/uL 3.5  4.3  6.5   Hemoglobin 12.0 - 15.0 g/dL 12.9  12.7  12.5   Hematocrit 36.0 - 46.0 % 39.1  38.0  37.7   Platelets 150 - 400 K/uL 207  209.0  224        Latest Ref Rng & Units 08/17/2021    4:43 PM 08/07/2021   10:54 AM 07/23/2021    4:57 PM  CMP  Glucose 70 - 99 mg/dL 95  98  86   BUN 8 - 23 mg/dL '13  9  9   '$ Creatinine 0.44 - 1.00 mg/dL 1.13  1.03  1.05   Sodium 135 - 145 mmol/L 140  141  141   Potassium 3.5 - 5.1 mmol/L 3.5  3.8  4.2   Chloride 98 - 111 mmol/L 106  104  103   CO2 22 - 32 mmol/L '27  28  26   '$ Calcium 8.9 - 10.3 mg/dL 8.8  9.2  9.9   Total Protein 6.0 - 8.3 g/dL  7.2    Total Bilirubin 0.2 - 1.2 mg/dL  0.6    Alkaline Phos 39 - 117 U/L  82    AST 0 - 37 U/L  23    ALT 0 - 35 U/L  5        RUQ sonogram 09/24/2021: Gallbladder: Surgically absent  Common bile duct: Diameter: 6 mm  Liver: Parenchymal echogenicity: Mild heterogeneity of hepatic parenchyma. Lesions: None  Portal vein: Patent.  Hepatopetal flow   Other: None.  IMPRESSION: Nonspecific mild heterogeneity of the hepatic parenchyma. No focal hepatic lesion.    Current Medications, Allergies, Past Medical History, Past Surgical History, Family History and Social History were reviewed in [employer] record.   Review of Systems:   Constitutional: Negative for fever, sweats, chills or weight loss.  Respiratory: Negative for shortness of breath.   Cardiovascular: Negative for chest pain, palpitations and leg swelling.  Gastrointestinal: See HPI.  Musculoskeletal: Negative for back pain or muscle aches.  Neurological: Negative for dizziness, headaches or paresthesias.    Physical Exam: There were no vitals taken for this visit. General: Well developed, w   ***female in no acute distress. Head: Normocephalic and atraumatic. Eyes: No scleral icterus. Conjunctiva pink . Ears: Normal auditory acuity. Mouth: Dentition intact. No ulcers or lesions.  Lungs: Clear throughout to auscultation. Heart: Regular rate and rhythm, no murmur. Abdomen: Soft, nontender and nondistended. No masses or hepatomegaly. Normal bowel sounds x 4 quadrants.  Rectal: *** Musculoskeletal: Symmetrical  with no gross deformities. Extremities: No edema. Neurological: Alert oriented x 4. No focal deficits.  Psychological: Alert and cooperative. Normal mood and affect  Assessment and Recommendations:  68) 71 year old female with GERD and "stomach rumbling", improving on Pantoprazole 40 mg twice daily and Famotidine 20 mg Q HS. EGD 01/11/2019 showed gastritis, no evidence of H. Pylori.  -? EGD   2) RUQ tenderness, positional/musculoskeletal component.  No nausea or vomiting.  Normal LFTs 07/2021.  Past cholecystectomy.   3) Hepatic steatosis.    4) Nonobstructive CAD   5) Hyperplastic polyp per colonoscopy 12/2018 -Next colonoscopy due 12/2028

## 2021-10-29 ENCOUNTER — Ambulatory Visit (INDEPENDENT_AMBULATORY_CARE_PROVIDER_SITE_OTHER): Payer: Medicare Other | Admitting: Nurse Practitioner

## 2021-10-29 ENCOUNTER — Encounter: Payer: Self-pay | Admitting: Nurse Practitioner

## 2021-10-29 ENCOUNTER — Other Ambulatory Visit: Payer: Self-pay | Admitting: Internal Medicine

## 2021-10-29 VITALS — BP 124/80 | HR 75 | Ht 61.0 in | Wt 144.0 lb

## 2021-10-29 DIAGNOSIS — K219 Gastro-esophageal reflux disease without esophagitis: Secondary | ICD-10-CM | POA: Diagnosis not present

## 2021-10-29 DIAGNOSIS — R0789 Other chest pain: Secondary | ICD-10-CM

## 2021-10-29 MED ORDER — PANTOPRAZOLE SODIUM 40 MG PO TBEC: 40.0000 mg | DELAYED_RELEASE_TABLET | Freq: Two times a day (BID) | ORAL | 2 refills | Status: DC

## 2021-10-29 MED ORDER — FAMOTIDINE 20 MG PO TABS: 20.0000 mg | ORAL_TABLET | Freq: Every evening | ORAL | 2 refills | Status: DC | PRN

## 2021-10-29 NOTE — Patient Instructions (Addendum)
  1) Follow up with your primary care physician regarding right rib cage pain  2) Continue Pantoprazole '40mg'$  one capsule by mouth to  be taken 30 minutes before breakfast and dinner  3) Continue Famotidine '20mg'$  one tab at bed time   4) Follow up as needed  We have sent the following medications to your pharmacy for you to pick up at your convenience: Pantoprazole   Famotidine   If you are age 69 or older, your body mass index should be between 23-30. Your Body mass index is 27.21 kg/m. If this is out of the aforementioned range listed, please consider follow up with your Primary Care Provider.  If you are age 77 or younger, your body mass index should be between 19-25. Your Body mass index is 27.21 kg/m. If this is out of the aformentioned range listed, please consider follow up with your Primary Care Provider.   ________________________________________________________  The [facility] providers would like to encourage you to use [facility] to communicate with providers for non-urgent requests or questions.  Due to long hold times on the telephone, sending your provider a message by [facility] may be a faster and more efficient way to get a response.  Please allow 48 business hours for a response.  Please remember that this is for non-urgent requests.  _______________________________________________________   I appreciate the  opportunity to care for you  Thank You   Marcella Dubs

## 2021-11-02 ENCOUNTER — Telehealth: Payer: Self-pay | Admitting: Cardiology

## 2021-11-02 NOTE — Telephone Encounter (Signed)
Pt c/o of Chest Pain: STAT if CP now or developed within 24 hours  1. Are you having CP right now? No   2. Are you experiencing any other symptoms (ex. SOB, nausea, vomiting, sweating)? No   3. How long have you been experiencing CP? Started this week   4. Is your CP continuous or coming and going? Coming and going   5. Have you taken Nitroglycerin? No    CP on left side.  ?

## 2021-11-02 NOTE — Telephone Encounter (Signed)
Spoke with pt who is reporting for the last week she has been experiencing a fleeting electric shock feeling in her chest on the left side.  It occurs for appr 1 second and then is gone.  She denies any symptoms such as N/V, SOB, diaphoresis etc.  She does report some mild tenderness in the center of her chest but no radiation.  Nothing seems to make it worse of better.  It occurs sometimes when she is laying in bed and sometimes when she is sitting in her den.  No change with activity, arm movements etc.  Current no s/s are present.  She states it just scares me so I want to get it check out.  Reassurance as well as ED precautions given.  Pt has been scheduled for f/u with Dr Marlou Porch at [facility] on Monday 6/26.  She is aware to contact the office prior to them if any further questions or concerns.

## 2021-11-05 NOTE — Progress Notes (Signed)
Seems unlikely to be GI related given the rib tenderness and positional nature of the pain.

## 2021-11-12 ENCOUNTER — Ambulatory Visit (INDEPENDENT_AMBULATORY_CARE_PROVIDER_SITE_OTHER): Payer: Medicare Other | Admitting: Cardiology

## 2021-11-12 DIAGNOSIS — E782 Mixed hyperlipidemia: Secondary | ICD-10-CM

## 2021-11-12 DIAGNOSIS — K219 Gastro-esophageal reflux disease without esophagitis: Secondary | ICD-10-CM | POA: Diagnosis not present

## 2021-11-12 DIAGNOSIS — I2510 Atherosclerotic heart disease of native coronary artery without angina pectoris: Secondary | ICD-10-CM

## 2021-11-12 MED ORDER — EZETIMIBE 10 MG PO TABS: 10.0000 mg | ORAL_TABLET | Freq: Every day | ORAL | 3 refills | Status: DC

## 2021-11-12 NOTE — Assessment & Plan Note (Signed)
Nonobstructive coronary artery disease on coronary CT scan 07/25/2021.  Overall reassuring.  Her sharp sudden chest discomfort is noncardiac in etiology.  Continue with goal-directed medical therapy.

## 2021-11-12 NOTE — Assessment & Plan Note (Signed)
On Praluent.  Did not tolerate statins.  Should not utilize statins again in her lifetime because she had hepatic injury.  Dr. Christella Hartigan has followed in the past.  Interestingly she did not have a robust response of the Praluent.  LDL at 1 point was 113.  Zetia was added.  Closely following LFTs.

## 2021-11-13 ENCOUNTER — Other Ambulatory Visit: Payer: Self-pay | Admitting: Cardiology

## 2021-11-13 DIAGNOSIS — E782 Mixed hyperlipidemia: Secondary | ICD-10-CM

## 2021-11-13 DIAGNOSIS — I2510 Atherosclerotic heart disease of native coronary artery without angina pectoris: Secondary | ICD-10-CM

## 2021-11-13 MED ORDER — PRALUENT 150 MG/ML ~~LOC~~ SOAJ: 1.0000 "pen " | SUBCUTANEOUS | 3 refills | Status: AC

## 2021-11-13 MED ORDER — EZETIMIBE 10 MG PO TABS: 10.0000 mg | ORAL_TABLET | Freq: Every day | ORAL | 3 refills | Status: DC

## 2021-11-13 NOTE — Telephone Encounter (Signed)
Pt's medication was sent to pt's pharmacy as requested. Confirmation received.  °

## 2021-12-04 ENCOUNTER — Other Ambulatory Visit: Payer: Self-pay | Admitting: Internal Medicine

## 2021-12-06 DIAGNOSIS — N644 Mastodynia: Secondary | ICD-10-CM | POA: Diagnosis not present

## 2021-12-07 ENCOUNTER — Other Ambulatory Visit: Payer: Self-pay

## 2021-12-07 DIAGNOSIS — N644 Mastodynia: Secondary | ICD-10-CM

## 2021-12-11 ENCOUNTER — Ambulatory Visit: Payer: Medicare Other | Admitting: Gastroenterology

## 2021-12-18 ENCOUNTER — Other Ambulatory Visit: Payer: Medicare Other

## 2021-12-22 ENCOUNTER — Ambulatory Visit: Payer: Medicare Other

## 2021-12-22 ENCOUNTER — Ambulatory Visit
Admission: RE | Admit: 2021-12-22 | Discharge: 2021-12-22 | Disposition: A | Discharge status: Home / Self Care | Payer: Medicare Other | Source: Ambulatory Visit

## 2021-12-22 DIAGNOSIS — N644 Mastodynia: Secondary | ICD-10-CM

## 2021-12-24 ENCOUNTER — Other Ambulatory Visit: Payer: Self-pay

## 2021-12-24 DIAGNOSIS — Z1231 Encounter for screening mammogram for malignant neoplasm of breast: Secondary | ICD-10-CM

## 2022-01-07 DIAGNOSIS — H524 Presbyopia: Secondary | ICD-10-CM | POA: Diagnosis not present

## 2022-01-07 DIAGNOSIS — H26493 Other secondary cataract, bilateral: Secondary | ICD-10-CM | POA: Diagnosis not present

## 2022-01-07 DIAGNOSIS — H401431 Capsular glaucoma with pseudoexfoliation of lens, bilateral, mild stage: Secondary | ICD-10-CM | POA: Diagnosis not present

## 2022-01-07 DIAGNOSIS — H40013 Open angle with borderline findings, low risk, bilateral: Secondary | ICD-10-CM | POA: Diagnosis not present

## 2022-01-07 DIAGNOSIS — H04123 Dry eye syndrome of bilateral lacrimal glands: Secondary | ICD-10-CM | POA: Diagnosis not present

## 2022-01-08 DIAGNOSIS — N644 Mastodynia: Secondary | ICD-10-CM | POA: Diagnosis not present

## 2022-01-08 DIAGNOSIS — M25512 Pain in left shoulder: Secondary | ICD-10-CM | POA: Diagnosis not present

## 2022-01-24 IMAGING — MG DIGITAL SCREENING BILAT W/ TOMO W/ CAD
8 series · 9 of 24 positions shown · non-contrast
Comparison: Previous exam(s).

CLINICAL DATA: Screening.

EXAM:
DIGITAL SCREENING BILATERAL MAMMOGRAM WITH TOMO AND CAD

[R MLO synth-2D]
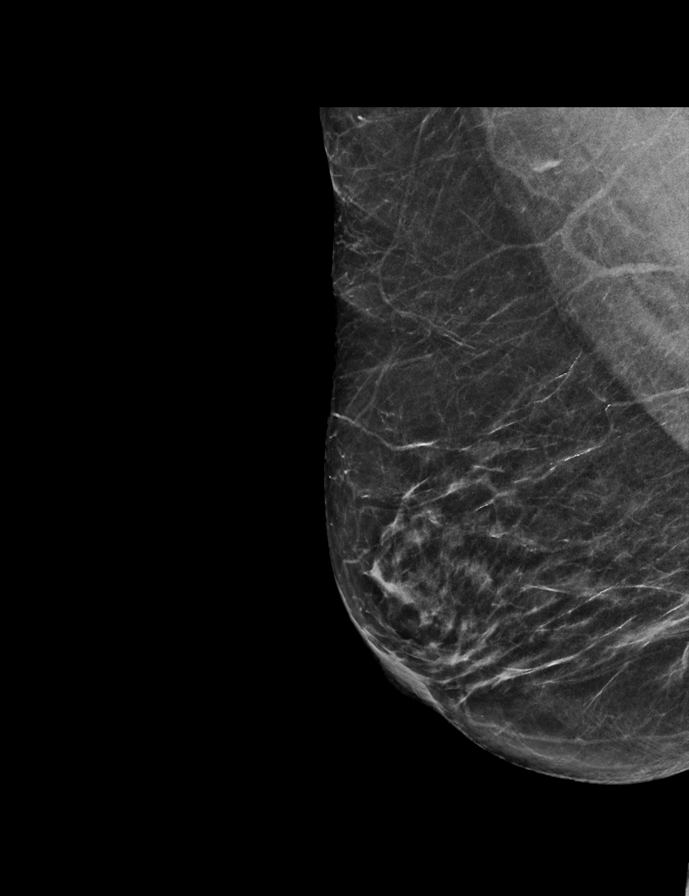

[R CC synth-2D]
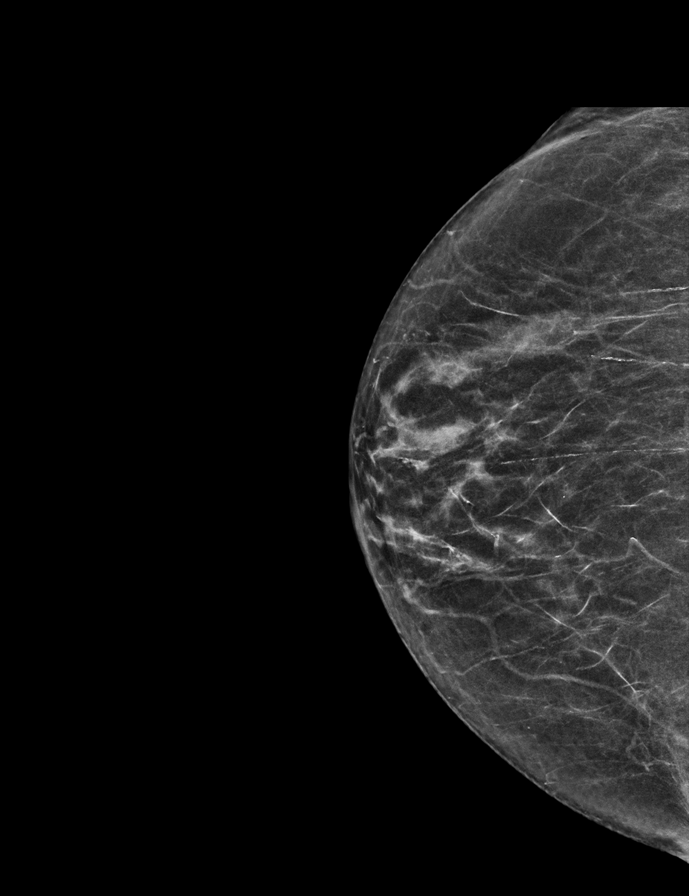

[L MLO synth-2D]
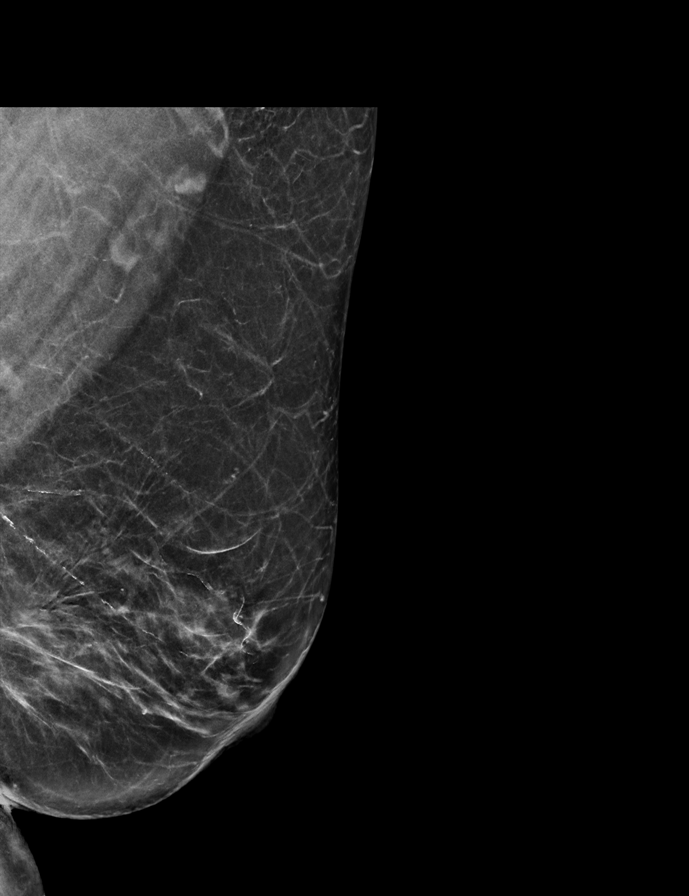

[L CC synth-2D]
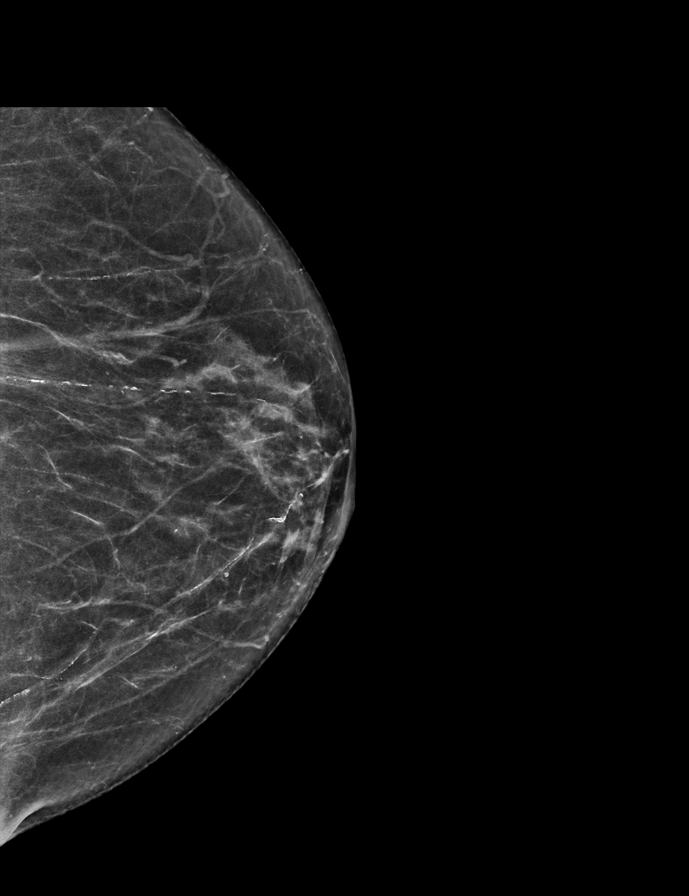

[R MLO tomo · 2 of 56 frames shown]
[frame 19/56]
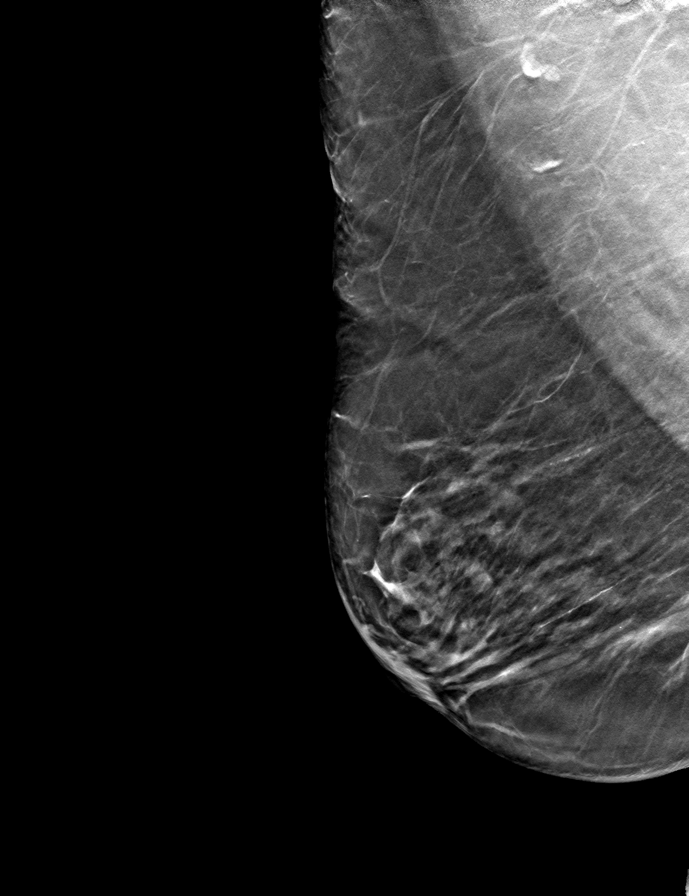
[frame 29/56]
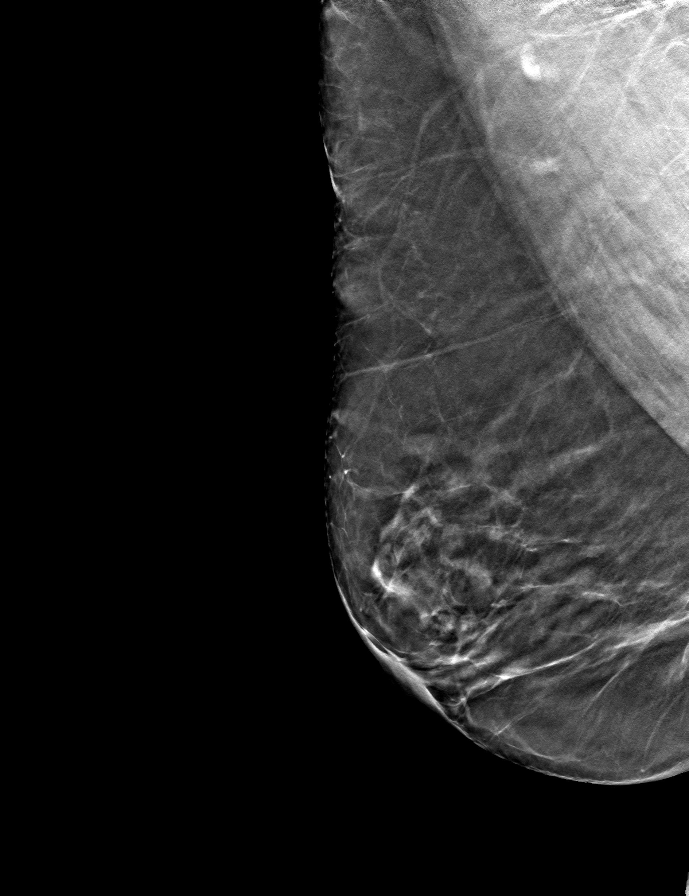

[L MLO tomo · tomo slice 34/67.0]
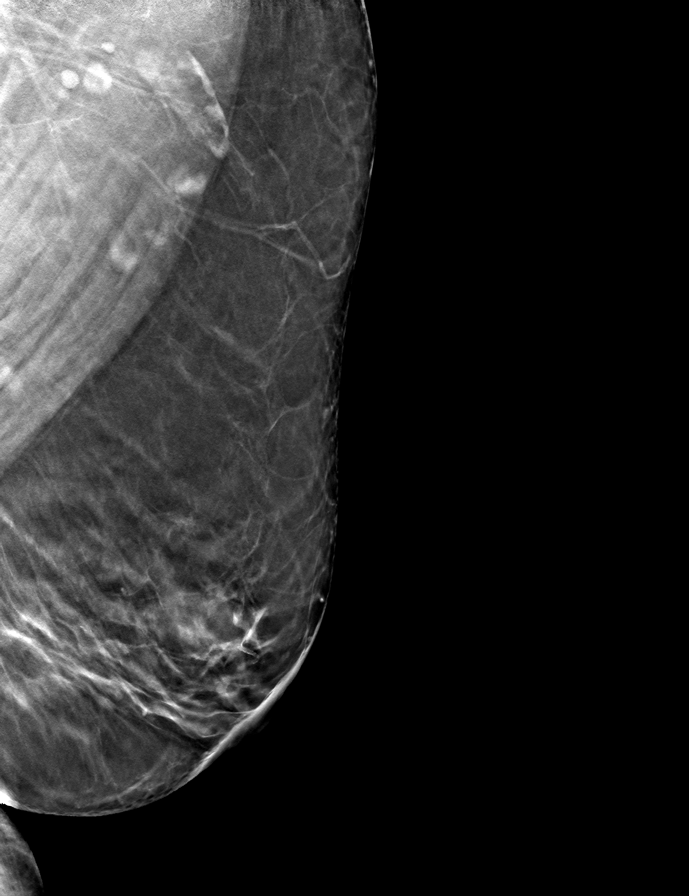

[R CC tomo · tomo slice 25/50.0]
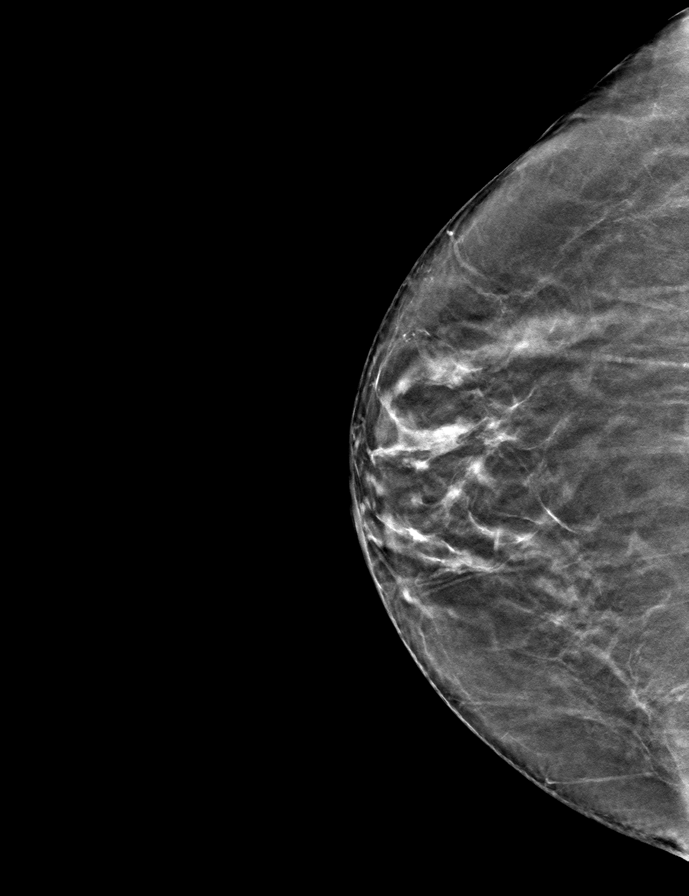

[L CC tomo · tomo slice 29/56.0]
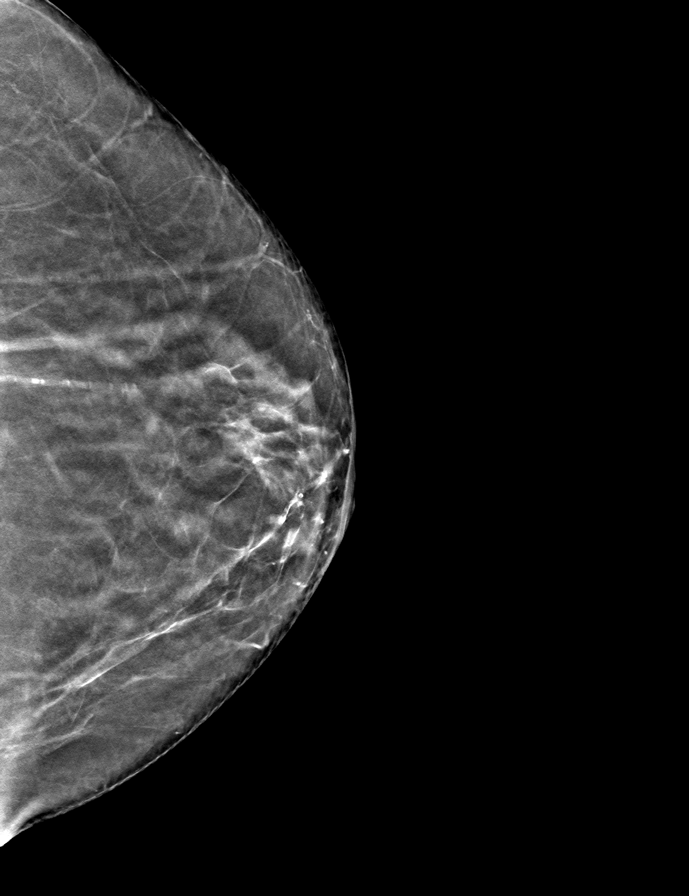

[9 of 24 positions shown; findings below may reference images not displayed]

ACR Breast Density Category b: There are scattered areas of
fibroglandular density.
FINDINGS: There are no findings suspicious for malignancy. Images were
processed with CAD.
IMPRESSION: No mammographic evidence of malignancy. A result letter of this
screening mammogram will be mailed directly to the patient.

RECOMMENDATION:
Screening mammogram in one year. (Code:[id number])

BI-RADS CATEGORY  1: Negative.

## 2022-01-28 ENCOUNTER — Ambulatory Visit: Payer: Medicare Other | Admitting: Cardiology

## 2022-02-04 DIAGNOSIS — M7552 Bursitis of left shoulder: Secondary | ICD-10-CM | POA: Diagnosis not present

## 2022-02-04 DIAGNOSIS — M75102 Unspecified rotator cuff tear or rupture of left shoulder, not specified as traumatic: Secondary | ICD-10-CM | POA: Diagnosis not present

## 2022-02-04 DIAGNOSIS — M67814 Other specified disorders of tendon, left shoulder: Secondary | ICD-10-CM | POA: Diagnosis not present

## 2022-02-04 DIAGNOSIS — M25512 Pain in left shoulder: Secondary | ICD-10-CM | POA: Diagnosis not present

## 2022-02-18 DIAGNOSIS — Z23 Encounter for immunization: Secondary | ICD-10-CM | POA: Diagnosis not present

## 2022-02-18 DIAGNOSIS — M25512 Pain in left shoulder: Secondary | ICD-10-CM | POA: Diagnosis not present

## 2022-02-19 DIAGNOSIS — H26493 Other secondary cataract, bilateral: Secondary | ICD-10-CM | POA: Diagnosis not present

## 2022-02-22 DIAGNOSIS — M25512 Pain in left shoulder: Secondary | ICD-10-CM | POA: Diagnosis not present

## 2022-02-25 DIAGNOSIS — M25512 Pain in left shoulder: Secondary | ICD-10-CM | POA: Diagnosis not present

## 2022-02-27 DIAGNOSIS — M25512 Pain in left shoulder: Secondary | ICD-10-CM | POA: Diagnosis not present

## 2022-03-04 DIAGNOSIS — M25512 Pain in left shoulder: Secondary | ICD-10-CM | POA: Diagnosis not present

## 2022-03-06 DIAGNOSIS — M25512 Pain in left shoulder: Secondary | ICD-10-CM | POA: Diagnosis not present

## 2022-03-13 DIAGNOSIS — M25512 Pain in left shoulder: Secondary | ICD-10-CM | POA: Diagnosis not present

## 2022-03-18 DIAGNOSIS — M542 Cervicalgia: Secondary | ICD-10-CM | POA: Diagnosis not present

## 2022-03-18 DIAGNOSIS — M75102 Unspecified rotator cuff tear or rupture of left shoulder, not specified as traumatic: Secondary | ICD-10-CM | POA: Diagnosis not present

## 2022-03-18 DIAGNOSIS — M67814 Other specified disorders of tendon, left shoulder: Secondary | ICD-10-CM | POA: Diagnosis not present

## 2022-03-18 DIAGNOSIS — M7552 Bursitis of left shoulder: Secondary | ICD-10-CM | POA: Diagnosis not present

## 2022-04-01 ENCOUNTER — Ambulatory Visit: Payer: Medicare Other

## 2022-04-01 DIAGNOSIS — M5030 Other cervical disc degeneration, unspecified cervical region: Secondary | ICD-10-CM | POA: Diagnosis not present

## 2022-04-01 DIAGNOSIS — M5412 Radiculopathy, cervical region: Secondary | ICD-10-CM | POA: Diagnosis not present

## 2022-04-01 DIAGNOSIS — M47812 Spondylosis without myelopathy or radiculopathy, cervical region: Secondary | ICD-10-CM | POA: Diagnosis not present

## 2022-04-04 ENCOUNTER — Ambulatory Visit: Payer: Medicare Other

## 2022-04-04 ENCOUNTER — Ambulatory Visit
Admission: RE | Admit: 2022-04-04 | Discharge: 2022-04-04 | Disposition: A | Discharge status: Home / Self Care | Payer: Medicare Other | Source: Ambulatory Visit

## 2022-04-04 DIAGNOSIS — Z1231 Encounter for screening mammogram for malignant neoplasm of breast: Secondary | ICD-10-CM

## 2022-04-26 ENCOUNTER — Ambulatory Visit: Payer: Medicare Other | Admitting: Cardiology

## 2022-05-08 DIAGNOSIS — M542 Cervicalgia: Secondary | ICD-10-CM | POA: Diagnosis not present

## 2022-05-23 ENCOUNTER — Other Ambulatory Visit (HOSPITAL_COMMUNITY): Payer: Self-pay

## 2022-06-12 ENCOUNTER — Encounter: Payer: Self-pay | Admitting: Gastroenterology

## 2022-06-12 ENCOUNTER — Ambulatory Visit (INDEPENDENT_AMBULATORY_CARE_PROVIDER_SITE_OTHER): Payer: Medicare Other | Admitting: Gastroenterology

## 2022-06-12 ENCOUNTER — Other Ambulatory Visit (INDEPENDENT_AMBULATORY_CARE_PROVIDER_SITE_OTHER): Payer: Medicare Other

## 2022-06-12 VITALS — BP 120/70 | HR 72 | Ht 61.0 in | Wt 141.0 lb

## 2022-06-12 DIAGNOSIS — K30 Functional dyspepsia: Secondary | ICD-10-CM

## 2022-06-12 DIAGNOSIS — R1011 Right upper quadrant pain: Secondary | ICD-10-CM

## 2022-06-12 LAB — GAMMA GT: GGT: 9 U/L (ref 7–51)

## 2022-06-12 LAB — COMPREHENSIVE METABOLIC PANEL
ALT: 5 U/L (ref 0–35)
AST: 22 U/L (ref 0–37)
Albumin: 4.2 g/dL (ref 3.5–5.2)
Alkaline Phosphatase: 68 U/L (ref 39–117)
BUN: 10 mg/dL (ref 6–23)
CO2: 31 mEq/L (ref 19–32)
Calcium: 9.2 mg/dL (ref 8.4–10.5)
Chloride: 102 mEq/L (ref 96–112)
Creatinine, Ser: 1 mg/dL (ref 0.40–1.20)
GFR: 57.53 mL/min — ABNORMAL LOW (ref >60.00)
Glucose, Bld: 110 mg/dL — ABNORMAL HIGH (ref 70–99)
Potassium: 3.7 mEq/L (ref 3.5–5.1)
Sodium: 139 mEq/L (ref 135–145)
Total Bilirubin: 0.4 mg/dL (ref 0.2–1.2)
Total Protein: 7.3 g/dL (ref 6.0–8.3)

## 2022-06-12 LAB — PROTIME-INR
INR: 1.1 ratio — ABNORMAL HIGH (ref 0.8–1.0)
Prothrombin Time: 11.7 s (ref 9.6–13.1)

## 2022-06-12 NOTE — Patient Instructions (Addendum)
Your provider has requested that you go to the basement level for lab work before leaving today. Press "B" on the elevator. The lab is located at the first door on the left as you exit the elevator.  Try to take Omeprazole daily before meals for 1 month .You may use Pepcid as needed.   Follow up on: 08/15/22 at 1:50 pm with Dr. Rush Landmark   Due to recent changes in healthcare laws, you may see the results of your imaging and laboratory studies on MyChart before your provider has had a chance to review them.  We understand that in some cases there may be results that are confusing or concerning to you. Not all laboratory results come back in the same time frame and the provider may be waiting for multiple results in order to interpret others.  Please give [place] 48 hours in order for your provider to thoroughly review all the results before contacting the office for clarification of your results.   _______________________________________________________  If your blood pressure at your visit was 140/90 or greater, please contact your primary care physician to follow up on this.  _______________________________________________________  If you are age 54 or older, your body mass index should be between 23-30. Your Body mass index is 26.64 kg/m. If this is out of the aforementioned range listed, please consider follow up with your Primary Care Provider.  If you are age 53 or younger, your body mass index should be between 19-25. Your Body mass index is 26.64 kg/m. If this is out of the aformentioned range listed, please consider follow up with your Primary Care Provider.   ________________________________________________________  The [facility] providers would like to encourage you to use [facility] to communicate with providers for non-urgent requests or questions.  Due to long hold times on the telephone, sending your provider a message by [facility] may be a faster and more efficient way to get a response.   Please allow 48 business hours for a response.  Please remember that this is for non-urgent requests.  _______________________________________________________  Thank you for choosing me and [facility].  Dr. Rush Landmark

## 2022-06-12 NOTE — Progress Notes (Unsigned)
[place] VISIT   Primary Care Provider Merrilee Seashore, [address] [place] [address] [phone]   Patient Profile: Dominique Mooney is a 72 y.o. female with a pmh significant for CAD, hypertension, hypothyroidism, hyperlipidemia, status post cholecystectomy, GERD, colon polyps.  The patient presents to the [facility] for an evaluation and management of problem(s) noted below:  Problem List 1. RUQ pain   2. Acid indigestion     History of Present Illness Please see prior notes for full details of HPI.  Interval History The patient returns for follow-up.  She is a patient of Dr. Ardis Hughs and this is the first time I meet her.  We reviewed her history briefly.  She has dealt with issues as a result of statins have caused her issues.  She has lost weight over the course the last year and has decreased her size to an 8 and she is quite happy with that.  She did this by decreasing her fatty food and fried food intake and monitoring her overall intake.  The right upper quadrant discomfort is not been as much of an issue for her but still occurs at times.  In the last week she had a possible acid indigestion that took about 10 minutes worth of this time to get through.  She continues to take omeprazole.  She has not had recent liver test checked.  She has not had any changes in her bowel habits.  She has not noted any blood in her stools.  GI Review of Systems Positive as above Negative for nausea, vomiting, dysphagia, odynophagia, melena, hematochezia  Review of Systems General: Denies fevers/chills/weight loss unintentionally Cardiovascular: Denies chest pain Pulmonary: Denies shortness of breath Gastroenterological: See HPI Genitourinary: Denies darkened urine  Hematological: Denies easy bruising/bleeding Dermatological: Denies jaundice Psychological: Mood is stable   Medications Current Outpatient  Medications  Medication Sig Dispense Refill   albuterol (PROVENTIL HFA) 108 (90 Base) MCG/ACT inhaler INHALE 2 PUFFS INTO THE LUNGS EVERY 6 (SIX) HOURS AS NEEDED. 1 Inhaler 3   Alirocumab (PRALUENT) 150 MG/ML SOAJ Inject 1 pen  into the skin every 14 (fourteen) days. 6 mL 3   aspirin 81 MG EC tablet Take 81 mg by mouth daily.     budesonide-formoterol (SYMBICORT) 80-4.5 MCG/ACT inhaler Inhale 2 puffs into the lungs 2 (two) times daily. 1 Inhaler 3   cetirizine (ZYRTEC) 10 MG tablet Take 10 mg by mouth daily as needed.      dicyclomine (BENTYL) 10 MG capsule TAKE 1 CAPSULE (10 MG TOTAL) BY MOUTH 2 (TWO) TIMES DAILY AS NEEDED FOR SPASMS (ABDOMINAL PAIN). 180 capsule 1   fish oil-omega-3 fatty acids 1000 MG capsule Take 1 g by mouth daily.      fluticasone (FLONASE) 50 MCG/ACT nasal spray Place 2 sprays into both nostrils daily. 16 g 2   levothyroxine (SYNTHROID) 75 MCG tablet Take 75 mcg by mouth daily.     metoprolol succinate (TOPROL-XL) 25 MG 24 hr tablet Take 1 tablet (25 mg total) by mouth daily. 90 tablet 3   Multiple Vitamin (MULITIVITAMIN WITH MINERALS) TABS Take 1 tablet by mouth daily.      pantoprazole (PROTONIX) 40 MG tablet Take 1 tablet (40 mg total) by mouth 2 (two) times daily before a meal. Take 30 minutes before dinner and breakfast 180 tablet 2   No current facility-administered medications for this visit.    Allergies Allergies  Allergen Reactions   Statins Other (  See Comments)    Elevated Liver functions   Penicillins Rash   Lipitor [Atorvastatin] Other (See Comments)    Abdominal bloating    Histories Past Medical History:  Diagnosis Date   Allergic rhinitis        Arthritis    Asthma        Chest pressure    a. Normal Stress Echo 0/3474   Complication of anesthesia    GERD (gastroesophageal reflux disease)    Hyperlipidemia    Hypertension    Hypothyroidism    Irritable bowel syndrome    Pancreatitis    after ERCP   Pneumonia    PONV (postoperative  nausea and vomiting)    Past Surgical History:  Procedure Laterality Date   BRAVO [facility] STUDY  05/02/2011   Procedure: BRAVO [employer] STUDY;  Surgeon: Owens Loffler, MD;  Location: [facility] ENDOSCOPY;  Service: Endoscopy;  Laterality: N/A;  48 hour wirless pH test (Bravo Test)   CHOLECYSTECTOMY     ESOPHAGOGASTRODUODENOSCOPY  05/02/2011   Procedure: ESOPHAGOGASTRODUODENOSCOPY (EGD);  Surgeon: Owens Loffler, MD;  Location: [facility];  Service: Endoscopy;  Laterality: N/A;   HERNIA REPAIR     LEFT HEART CATH AND CORONARY ANGIOGRAPHY N/A 06/26/2017   Procedure: LEFT HEART CATH AND CORONARY ANGIOGRAPHY;  Surgeon: Belva Crome, MD;  Location: [facility];  Service: Cardiovascular;  Laterality: N/A;   MENISCUS REPAIR Left 03/18/2016   and repair of cracked bone   SALPINGOOPHORECTOMY     TOTAL ABDOMINAL HYSTERECTOMY     TOTAL KNEE ARTHROPLASTY Left 07/16/2019   Procedure: TOTAL KNEE ARTHROPLASTY;  Surgeon: Sydnee Cabal, MD;  Location: [facility]S;  Service: Orthopedics;  Laterality: Left;   Social History   Socioeconomic History   Marital status: Married    Spouse name: Not on file   Number of children: 2   Years of education: Not on file   Highest education level: Not on file  Occupational History   Occupation: retired    Fish farm manager: [employer]  Tobacco Use   Smoking status: Never   Smokeless tobacco: Never  Vaping Use   Vaping Use: Never used  Substance and Sexual Activity   Alcohol use: No   Drug use: No   Sexual activity: Not on file  Other Topics Concern   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   Currently retired as of 07/2012      [facility] (11/26/16):   Originally from [place] [facility]. Has always lived in [place]. She retired from [employer]. 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 No pets currently. No bird exposure. No mold or hot tub exposure. Does have carpet in her bedroom. No feather  bedding. She has blinds & draperies. No indoor plants. Enjoys helping out in her church and with nonprofit work. Enjoys going to the beach.    Social Determinants of Health   Financial Resource Strain: Not on file  Food Insecurity: Not on file  Transportation Needs: Not on file  Physical Activity: Not on file  Stress: Not on file  Social Connections: Not on file  Intimate Partner Violence: Not on file   Family History  Problem Relation Age of Onset   Stroke Mother    Stroke Father    Allergies Sister    Colon polyps Sister    Allergies Brother    Heart attack Brother    Heart  attack Brother    Allergies Son    Colon cancer Neg Hx    Stomach cancer Neg Hx    Esophageal cancer Neg Hx    Lung disease Neg Hx    Cancer Neg Hx    Rectal cancer Neg Hx    Breast cancer Neg Hx    Inflammatory bowel disease Neg Hx    Liver disease Neg Hx    Pancreatic cancer Neg Hx    I have reviewed her medical, social, and family history in detail and updated the electronic medical record as necessary.    PHYSICAL EXAMINATION  BP 120/70   Pulse 72   Ht '5\' 1"'$  (1.549 m)   Wt 141 lb (64 kg)   BMI 26.64 kg/m  Wt Readings from Last 3 Encounters:  06/12/22 141 lb (64 kg)  11/12/21 144 lb (65.3 kg)  10/29/21 144 lb (65.3 kg)  GEN: NAD, appears stated age, doesn't appear chronically ill PSYCH: Cooperative, without pressured speech EYE: Conjunctivae pink, sclerae anicteric ENT: MMM CV: Nontachycardic RESP: No audible wheezing GI: NABS, soft, NT/ND, without rebound or guarding MSK/EXT: No significant lower extremity edema SKIN: No jaundice NEURO:  Alert & Oriented x 3, no focal deficits   REVIEW OF DATA  I reviewed the following data at the time of this encounter:  GI Procedures and Studies  No new studies to review  Laboratory Studies  Reviewed those in epic  Imaging Studies  May 2023 right upper quadrant abdominal ultrasound IMPRESSION: Nonspecific mild heterogeneity of the  hepatic parenchyma. No focal hepatic lesion.   ASSESSMENT  Ms. Larmore is a 72 y.o. female with a pmh significant for CAD, hypertension, hypothyroidism, hyperlipidemia, status post cholecystectomy, GERD, colon polyps.  The patient is seen today for evaluation and management of:  1. RUQ pain   2. Acid indigestion    The patient is clinically and hemodynamically stable at this time.  She continues to have episodes of right upper quadrant/right flank discomfort that occur.  Etiology in the past is felt to be musculoskeletal in origin.  Due to her previous history of issues of pancreatitis and gallbladder pathology is reasonable to repeat her liver test.  Last imaging from nearly a year ago showed heterogeneity of the hepatic parenchyma suggestive of potential fatty liver.  She has had weight loss and has been able to sustain that.  Things are improved with the weight loss but still present and she wants to be thoughtful and nothing else is being missed.  Will recheck her labs to ensure that her liver biochemical testing is normal.  She will continue taking her PPI and monitor.  If she has progressive symptoms of acid indigestion then we will consider the role of repeat endoscopic evaluation or cross-sectional imaging.  All patient questions were answered to the best of my ability, and the patient agrees to the aforementioned plan of action with follow-up as indicated.   PLAN  Laboratories as outlined below PPI once to twice daily as needed No plan for liver biopsy currently If patient has persisting/progressive symptoms we will consider repeat cross-sectional imaging (last in 2022) Colonoscopy in 2030   Orders Placed This Encounter  Procedures   Comp Met (CMET)   INR/PT   Gamma GT    New Prescriptions   No medications on file   Modified Medications   No medications on file    Planned Follow Up Return in about 2 months (around 08/11/2022).   Total Time in Face-to-Face and  in  Coordination of Care for patient including independent/personal interpretation/review of prior testing, medical history, examination, medication adjustment, communicating results with the patient directly, and documentation within the EHR is 25 minutes.   Justice Britain, MD [facility] Office # [phone]

## 2022-06-14 ENCOUNTER — Encounter: Payer: Self-pay | Admitting: Gastroenterology

## 2022-06-14 ENCOUNTER — Telehealth: Payer: Self-pay | Admitting: Gastroenterology

## 2022-06-14 NOTE — Telephone Encounter (Signed)
See alternate results note dated 1/26

## 2022-06-14 NOTE — Telephone Encounter (Signed)
Patient returning call in regards to lab results. Please advise.

## 2022-06-20 ENCOUNTER — Ambulatory Visit: Payer: Medicare Other | Admitting: Cardiology

## 2022-06-20 NOTE — Progress Notes (Deleted)
Cardiology Office Note:    Date:  06/20/2022   ID:  Dominique Mooney, DOB 1951-01-18, MRN [id number]  PCP:  Merrilee Seashore, MD   Dominique Mooney Providers Cardiologist:  Candee Furbish, MD     Referring MD: Merrilee Seashore, MD    History of Present Illness:    Dominique Mooney is a 72 y.o. female  here for the follow-up of shock-like chest pain, nonobstructive coronary atherosclerosis with cardiac catheterization as well as coronary CT with prior hepatic injury seemingly related to statin therapy followed by GI.  At prior visit: On 11/02/2021 she called the office and reported fleeting "electric shock feelings" in her left chest with onset about 1 week prior. Very brief, with no associated symptoms aside from mild central chest tenderness without radiation. No alleviating or worsening symptoms. She was scheduled for follow-up today.  Previously seen with GERD like CP, shooting to the left side. Pressure like. With prior statin induced hepatic injury, followed as well by Dr. Ardis Hughs with hyperlipidemia.  Appreciate help from lipid clinic.  She had a suboptimal response to both Repatha as well as Praluent.  We corresponded with pharmacy team as well as Dr. Ardis Hughs with gastroenterology and we will try Zetia with close monitoring of her LFTs. Her LFTs had been normal.  Previous CT scan of the abdomen and pelvis, unremarkable.  She did have some hepatic steatosis.   She has coronary artery disease with cardiac catheterization in 2019 showing nonobstructive CAD.  Coronary CT scan on 07/25/2021 showed similar findings.   Prior LDL 113 despite Praluent.  Triglycerides 98.  ALT 6 AST 24 LDL now 85 with Zetia addition   Alzheimer's disease runs in her family.  Today: Recently saw gastroenterology again.  LFTs were normal.  She had some right upper quadrant discomfort.  Denies any fevers chills nausea vomiting syncope bleeding    Past Medical History:  Diagnosis Date   Allergic rhinitis         Arthritis    Asthma        Chest pressure    a. Normal Stress Echo [id number]   Complication of anesthesia    GERD (gastroesophageal reflux disease)    Hyperlipidemia    Hypertension    Hypothyroidism    Irritable bowel syndrome    Pancreatitis    after ERCP   Pneumonia    PONV (postoperative nausea and vomiting)     Past Surgical History:  Procedure Laterality Date   BRAVO [facility] STUDY  05/02/2011   Procedure: BRAVO [place] STUDY;  Surgeon: Owens Loffler, MD;  Location: [facility];  Service: Endoscopy;  Laterality: N/A;  48 hour wirless pH test (Bravo Test)   CHOLECYSTECTOMY     ESOPHAGOGASTRODUODENOSCOPY  05/02/2011   Procedure: ESOPHAGOGASTRODUODENOSCOPY (EGD);  Surgeon: Owens Loffler, MD;  Location: [facility];  Service: Endoscopy;  Laterality: N/A;   HERNIA REPAIR     LEFT HEART CATH AND CORONARY ANGIOGRAPHY N/A 06/26/2017   Procedure: LEFT HEART CATH AND CORONARY ANGIOGRAPHY;  Surgeon: Belva Crome, MD;  Location: [place] [facility];  Service: Cardiovascular;  Laterality: N/A;   MENISCUS REPAIR Left 03/18/2016   and repair of cracked bone   SALPINGOOPHORECTOMY     TOTAL ABDOMINAL HYSTERECTOMY     TOTAL KNEE ARTHROPLASTY Left 07/16/2019   Procedure: TOTAL KNEE ARTHROPLASTY;  Surgeon: Sydnee Cabal, MD;  Location: [facility];  Service: Orthopedics;  Laterality: Left;    Current Medications: No outpatient medications have been marked as taking for the  06/20/22 encounter (Appointment) with Jerline Pain, MD.     Allergies:   Statins, Penicillins, and Lipitor [atorvastatin]   Social History   Socioeconomic History   Marital status: Married    Spouse name: Not on file   Number of children: 2   Years of education: Not on file   Highest education level: Not on file  Occupational History   Occupation: retired    Fish farm manager: [employer]  Tobacco Use   Smoking status: Never   Smokeless tobacco: Never  Vaping Use   Vaping Use: Never used  Substance and Sexual Activity    Alcohol use: No   Drug use: No   Sexual activity: Not on file  Other Topics Concern   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   Currently retired as of 07/2012      [facility] (11/26/16):   Originally from [facility]. Has always lived in [place]. She retired from [employer]. 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 No pets currently. No bird exposure. No mold or hot tub exposure. Does have carpet in her bedroom. No feather bedding. She has blinds & draperies. No indoor plants. Enjoys helping out in her church and with nonprofit work. Enjoys going to the beach.    Social Determinants of Health   Financial Resource Strain: Not on file  Food Insecurity: Not on file  Transportation Needs: Not on file  Physical Activity: Not on file  Stress: Not on file  Social Connections: Not on file     Family History: The patient's family history includes Allergies in her brother, sister, and son; Colon polyps in her sister; Heart attack in her brother and brother; Stroke in her father and mother. There is no history of Colon cancer, Stomach cancer, Esophageal cancer, Lung disease, Cancer, Rectal cancer, Breast cancer, Inflammatory bowel disease, Liver disease, or Pancreatic cancer.  ROS:   Please see the history of present illness.    (+) Chest pain (+) Acid reflux All other systems reviewed and are negative.  EKGs/Labs/Other Studies Reviewed:    The following studies were reviewed today:  Coronary CT  07/25/2021: FINDINGS: A 100 kV prospective scan was triggered in the descending thoracic aorta at 111 HU's. Axial non-contrast 3 mm slices were carried out through the heart. The data set was analyzed on a dedicated work station and scored using the [place]. Gantry rotation speed was 250 msecs and collimation was .6 mm. 0.8 mg of sl NTG was given. The 3D data set was reconstructed in 5% intervals of the 35-75  % of the R-R cycle. Phases were analyzed on a dedicated work station using MPR, MIP and VRT modes. The patient received 80 cc of contrast.   Coronary Arteries:  Normal coronary origin.  Right dominance.   RCA is a large dominant artery that gives rise to PDA and PLA. There calcified plaque in the proximal RCA causing 0-24%. Calcified plaque in the mid RCA causes 0-24%   Left main is a large artery that gives rise to LAD and LCX arteries.   LAD is a large vessel that has no plaque. Calcified plaque in the proximal LAD causes 0-24% stenosis. Calcified plaque in the mid LAD causes 0-24% stenosis. Calcified plaque in D1 causes 25-49% stenosis   LCX is a non-dominant artery that gives rise to one large OM1 branch. There is calcified plaque in  the proximal LCX causing 0-24% stenosis. Noncalcified plaque in distal LCX causes 0-24% stenosis.   Other findings:   Left Ventricle: Normal size   Left Atrium: Normal size   Pulmonary Veins: Normal configuration   Right Ventricle: Normal size   Right Atrium: Normal size   Cardiac valves: No calcifications   Thoracic aorta: Normal size   Pulmonary Arteries: Normal size   Systemic Veins: Normal drainage   Pericardium: Normal thickness   IMPRESSION: 1. Coronary calcium score of 659. This was 97th percentile for age and sex matched control.   2.  Normal coronary origin with right dominance.   3.  Nonobstructive CAD   4.  Calcified plaque in D1 causes mild (25-49%) stenosis   5. Calcified plaque in proximal and mid LAD, proximal and mid RCA, and proximal and distal LCX causes minimal (0-24%) stenosis   CAD-RADS 2. Mild non-obstructive CAD (25-49%). Consider non-atherosclerotic causes of chest pain. Consider preventive therapy and risk factor modification.  Cath 06/26/17: First diagonal, 40% obstruction in the ostium and mid vessel. LAD wraps around the left ventricular apex and contains diffuse 30% narrowing in the distal  segment. Circumflex is normal. Right dominant anatomy with normal RCA Normal left main Normal left ventricular function and hemodynamics with EF 65% and EDP 9 mmHg   RECOMMENDATIONS:   No significant obstructive disease is noted. Suggest aggressive risk factor modification to improve endothelial function and decrease the risk of progression.  Guideline directed targets would be LDL less than 70, A1c less than 7, tight blood pressure control, aerobic activity, and weight loss.  Would also be appropriate to determine if there is clinical suspicion of sleep apnea.  Diagnostic Dominance: Co-dominant   Transthoracic Echocardiogram  06/05/2017: Study Conclusions   - Left ventricle: The cavity size was normal. Wall thickness was    normal. Systolic function was normal. The estimated ejection    fraction was in the range of 60% to 65%. Wall motion was normal;    there were no regional wall motion abnormalities. Features are    consistent with a pseudonormal left ventricular filling pattern,    with concomitant abnormal relaxation and increased filling    pressure (grade 2 diastolic dysfunction).     EKG:  EKG is personally reviewed. 11/12/2021:  Sinus rhythm. Rate 64 bpm. Nonspecific T wave changes. 07/23/2021:  sinus rhythm 79 with nonspecific ST-T wave changes  Recent Labs: 08/07/2021: TSH 17.42 08/17/2021: Hemoglobin 12.9; Platelets 207 06/12/2022: ALT 5; BUN 10; Creatinine, Ser 1.00; Potassium 3.7; Sodium 139   Recent Lipid Panel    Component Value Date/Time   CHOL 159 04/03/2021 0819   TRIG 73 04/03/2021 0819   HDL 60 04/03/2021 0819   CHOLHDL 2.7 04/03/2021 0819   LDLCALC 85 04/03/2021 0819     Risk Assessment/Calculations:              Physical Exam:    VS:  There were no vitals taken for this visit.    Wt Readings from Last 3 Encounters:  06/12/22 141 lb (64 kg)  11/12/21 144 lb (65.3 kg)  10/29/21 144 lb (65.3 kg)     GEN:  Well nourished, well developed in no  acute distress HEENT: Normal NECK: No JVD; No carotid bruits LYMPHATICS: No lymphadenopathy CARDIAC: RRR, no murmurs, no rubs, gallops, no rashes RESPIRATORY:  Clear to auscultation without rales, wheezing or rhonchi  ABDOMEN: Soft, non-tender, non-distended MUSCULOSKELETAL:  No edema; No deformity  SKIN: Warm and dry NEUROLOGIC:  Alert  and oriented x 3 PSYCHIATRIC:  Normal affect   ASSESSMENT:    No diagnosis found.   PLAN:    In order of problems listed above:    CAD in native artery Nonobstructive coronary artery disease on coronary CT scan 07/25/2021.  Overall reassuring.  Her sharp sudden chest discomfort is noncardiac in etiology.  Continue with goal-directed medical therapy.  GERD Dr. Ardis Hughs has been seen.  Continue with current medication.  Hyperlipidemia On Praluent.  Did not tolerate statins.  Should not utilize statins again in her lifetime because she had hepatic injury.  Dr. Ardis Hughs has followed in the past.  Interestingly she did not have a robust response of the Praluent.  LDL at one point was 113.  Zetia was added.  LDL now 85, improved.  Closely following LFTs.  06/12/2022 ALT 22, normal.  Excellent.     Follow-up:  6 months.  Medication Adjustments/Labs and Tests Ordered: Current medicines are reviewed at length with the patient today.  Concerns regarding medicines are outlined above.   No orders of the defined types were placed in this encounter.  No orders of the defined types were placed in this encounter.  There are no Patient Instructions on file for this visit.    Signed, Candee Furbish, MD  06/20/2022 7:34 AM    [facility]

## 2022-07-09 DIAGNOSIS — E039 Hypothyroidism, unspecified: Secondary | ICD-10-CM | POA: Diagnosis not present

## 2022-07-15 DIAGNOSIS — E039 Hypothyroidism, unspecified: Secondary | ICD-10-CM | POA: Diagnosis not present

## 2022-07-30 NOTE — Progress Notes (Unsigned)
Office Visit    Patient Name: Dominique Mooney Date of Encounter: 07/31/2022  PCP:  Merrilee Seashore, [place]  Cardiologist:  Candee Furbish, MD  Advanced Practice Provider:  No care team member to display Electrophysiologist:  None [id number]  HPI    Dominique Mooney is a 72 y.o. female with a past medical history significant for cardiac shock, GERD like chest pain, hyperlipidemia, hypertension, hypothyroidism, and pancreatitis presents today for follow-up visit.  On 11/02/2021 she called the office and reported fleeting electric shock feelings in her left chest with onset about 1 week prior.  Very brief with no associated symptoms aside from mild central chest tenderness without radiation.  No alleviating or worsening symptoms.  Scheduled for follow-up.  She was last seen 11/12/2021 by Dr. Marlou Porch.  Previously seen with GERD like chest pain, she did on the left side.  Pressure-like with prior statin induced hepatic injury followed by Dr. Ardis Hughs for hyperlipidemia.  Appreciated help from lipid clinic.  She had a suboptimal response to both Repatha as well as Praluent.  We corresponded with pharmacy team as well as Dr. Ardis Hughs with gastroenterology and we had planned to try Zetia with close monitoring of her LFTs.  LFTs have been normal.  Previous CT scan of the abdomen and pelvis was unremarkable.  She did have some hepatic steatosis.  She has CAD with cardiac catheterization in 2019 showing nonobstructive CAD.  Prior LDL 113 despite purulent.  Triglycerides 98.  ALT 6, AST 24.  Alzheimer's disease runs in her family.  When she was last seen 11/12/2021 by Dr. Marlou Porch she confirmed shooting chest pains that have been ongoing for 2 weeks.  Sometimes noted different movement sensation in her central chest that she was not sure if it was related to GERD.  She denied palpitations, shortness of breath, peripheral edema, lightheadedness, headaches, syncope, orthopnea, and  PND.  Today, she has been experiencing some gurgling in her chest.  This makes her nervous and she has a strong family history of heart issues.  She lost her 2 older brothers to "heart problems".  We discussed maintaining a Mediterranean diet and proper exercise of 30 minutes for 5 days out of the week.  She is lost a lot of weight and continues to maintain.  We have provided her with some refills today and plan to update labs.  Reports no shortness of breath nor dyspnea on exertion. No edema, orthopnea, PND. Reports no palpitations.  Past Medical History    Past Medical History:  Diagnosis Date   Allergic rhinitis        Arthritis    Asthma        Chest pressure    a. Normal Stress Echo [id number]   Complication of anesthesia    GERD (gastroesophageal reflux disease)    Hyperlipidemia    Hypertension    Hypothyroidism    Irritable bowel syndrome    Pancreatitis    after ERCP   Pneumonia    PONV (postoperative nausea and vomiting)    Past Surgical History:  Procedure Laterality Date   BRAVO [facility] STUDY  05/02/2011   Procedure: BRAVO [place] STUDY;  Surgeon: Owens Loffler, MD;  [facility];  Service: Endoscopy;  Laterality: N/A;  48 hour wirless pH test (Bravo Test)   CHOLECYSTECTOMY     ESOPHAGOGASTRODUODENOSCOPY  05/02/2011   Procedure: ESOPHAGOGASTRODUODENOSCOPY (EGD);  Surgeon: Owens Loffler, MD;  Location: [facility];  Service: Endoscopy;  Laterality:  N/A;   HERNIA REPAIR     LEFT HEART CATH AND CORONARY ANGIOGRAPHY N/A 06/26/2017   Procedure: LEFT HEART CATH AND CORONARY ANGIOGRAPHY;  Surgeon: Belva Crome, MD;  Location: [place] [facility];  Service: Cardiovascular;  Laterality: N/A;   MENISCUS REPAIR Left 03/18/2016   and repair of cracked bone   SALPINGOOPHORECTOMY     TOTAL ABDOMINAL HYSTERECTOMY     TOTAL KNEE ARTHROPLASTY Left 07/16/2019   Procedure: TOTAL KNEE ARTHROPLASTY;  Surgeon: Sydnee Cabal, MD;  [facility]: [facility];  Service: Orthopedics;  Laterality:  Left;    Allergies  Allergies  Allergen Reactions   Statins Other (See Comments)    Elevated Liver functions   Penicillins Rash   Lipitor [Atorvastatin] Other (See Comments)    Abdominal bloating    EKGs/Labs/Other Studies Reviewed:   The following studies were reviewed today: Coronary CT  07/25/2021: FINDINGS: A 100 kV prospective scan was triggered in the descending thoracic aorta at 111 HU's. Axial non-contrast 3 mm slices were carried out through the heart. The data set was analyzed on a dedicated work station and scored using the [place]. Gantry rotation speed was 250 msecs and collimation was .6 mm. 0.8 mg of sl NTG was given. The 3D data set was reconstructed in 5% intervals of the 35-75 % of the R-R cycle. Phases were analyzed on a dedicated work station using MPR, MIP and VRT modes. The patient received 80 cc of contrast.   Coronary Arteries:  Normal coronary origin.  Right dominance.   RCA is a large dominant artery that gives rise to PDA and PLA. There calcified plaque in the proximal RCA causing 0-24%. Calcified plaque in the mid RCA causes 0-24%   Left main is a large artery that gives rise to LAD and LCX arteries.   LAD is a large vessel that has no plaque. Calcified plaque in the proximal LAD causes 0-24% stenosis. Calcified plaque in the mid LAD causes 0-24% stenosis. Calcified plaque in D1 causes 25-49% stenosis   LCX is a non-dominant artery that gives rise to one large OM1 branch. There is calcified plaque in the proximal LCX causing 0-24% stenosis. Noncalcified plaque in distal LCX causes 0-24% stenosis.   Other findings:   Left Ventricle: Normal size   Left Atrium: Normal size   Pulmonary Veins: Normal configuration   Right Ventricle: Normal size   Right Atrium: Normal size   Cardiac valves: No calcifications   Thoracic aorta: Normal size   Pulmonary Arteries: Normal size   Systemic Veins: Normal drainage   Pericardium: Normal  thickness   IMPRESSION: 1. Coronary calcium score of 659. This was 97th percentile for age and sex matched control.   2.  Normal coronary origin with right dominance.   3.  Nonobstructive CAD   4.  Calcified plaque in D1 causes mild (25-49%) stenosis   5. Calcified plaque in proximal and mid LAD, proximal and mid RCA, and proximal and distal LCX causes minimal (0-24%) stenosis   CAD-RADS 2. Mild non-obstructive CAD (25-49%). Consider non-atherosclerotic causes of chest pain. Consider preventive therapy and risk factor modification.   Cath 06/26/17: First diagonal, 40% obstruction in the ostium and mid vessel. LAD wraps around the left ventricular apex and contains diffuse 30% narrowing in the distal segment. Circumflex is normal. Right dominant anatomy with normal RCA Normal left main Normal left ventricular function and hemodynamics with EF 65% and EDP 9 mmHg   RECOMMENDATIONS:   No significant obstructive disease  is noted. Suggest aggressive risk factor modification to improve endothelial function and decrease the risk of progression.  Guideline directed targets would be LDL less than 70, A1c less than 7, tight blood pressure control, aerobic activity, and weight loss.  Would also be appropriate to determine if there is clinical suspicion of sleep apnea.   Diagnostic Dominance: Co-dominant   Transthoracic Echocardiogram  06/05/2017: Study Conclusions   - Left ventricle: The cavity size was normal. Wall thickness was    normal. Systolic function was normal. The estimated ejection    fraction was in the range of 60% to 65%. Wall motion was normal;    there were no regional wall motion abnormalities. Features are    consistent with a pseudonormal left ventricular filling pattern,    with concomitant abnormal relaxation and increased filling    pressure (grade 2 diastolic dysfunction).     EKG:  EKG is not ordered today.  Recent Labs: 08/07/2021: TSH 17.42 08/17/2021:  Hemoglobin 12.9; Platelets 207 06/12/2022: ALT 5; BUN 10; Creatinine, Ser 1.00; Potassium 3.7; Sodium 139  Recent Lipid Panel    Component Value Date/Time   CHOL 159 04/03/2021 0819   TRIG 73 04/03/2021 0819   HDL 60 04/03/2021 0819   CHOLHDL 2.7 04/03/2021 0819   LDLCALC 85 04/03/2021 0819    Home Medications   Current Meds  Medication Sig   albuterol (PROVENTIL HFA) 108 (90 Base) MCG/ACT inhaler INHALE 2 PUFFS INTO THE LUNGS EVERY 6 (SIX) HOURS AS NEEDED.   Alirocumab (PRALUENT) 150 MG/ML SOAJ Inject 1 pen  into the skin every 14 (fourteen) days.   aspirin 81 MG EC tablet Take 81 mg by mouth daily.   budesonide-formoterol (SYMBICORT) 80-4.5 MCG/ACT inhaler Inhale 2 puffs into the lungs 2 (two) times daily.   cetirizine (ZYRTEC) 10 MG tablet Take 10 mg by mouth daily as needed.    dicyclomine (BENTYL) 10 MG capsule TAKE 1 CAPSULE (10 MG TOTAL) BY MOUTH 2 (TWO) TIMES DAILY AS NEEDED FOR SPASMS (ABDOMINAL PAIN).   fish oil-omega-3 fatty acids 1000 MG capsule Take 1 g by mouth daily.    fluticasone (FLONASE) 50 MCG/ACT nasal spray Place 2 sprays into both nostrils daily.   levothyroxine (SYNTHROID) 75 MCG tablet Take 75 mcg by mouth daily.   Multiple Vitamin (MULITIVITAMIN WITH MINERALS) TABS Take 1 tablet by mouth daily.    pantoprazole (PROTONIX) 40 MG tablet Take 1 tablet (40 mg total) by mouth 2 (two) times daily before a meal. Take 30 minutes before dinner and breakfast (Patient taking differently: Take 40 mg by mouth as needed. Take 30 minutes before dinner and breakfast)   [DISCONTINUED] metoprolol succinate (TOPROL-XL) 25 MG 24 hr tablet Take 1 tablet (25 mg total) by mouth daily.   [DISCONTINUED] nitroGLYCERIN (NITROSTAT) 0.4 MG SL tablet Place 0.4 mg under the tongue every 5 (five) minutes as needed for chest pain.     Review of Systems      All other systems reviewed and are otherwise negative except as noted above.  Physical Exam    VS:  BP 110/68   Pulse 67   Ht '5\' 1"'$   (1.549 m)   Wt 143 lb (64.9 kg)   SpO2 97%   BMI 27.02 kg/m  , BMI Body mass index is 27.02 kg/m.  Wt Readings from Last 3 Encounters:  07/31/22 143 lb (64.9 kg)  06/12/22 141 lb (64 kg)  11/12/21 144 lb (65.3 kg)     GEN: Well nourished, well  developed, in no acute distress. HEENT: normal. Neck: Supple, no JVD, carotid bruits, or masses. Cardiac: RRR, no murmurs, rubs, or gallops. No clubbing, cyanosis, edema.  Radials/PT 2+ and equal bilaterally.  Respiratory:  Respirations regular and unlabored, clear to auscultation bilaterally. GI: Soft, nontender, nondistended. MS: No deformity or atrophy. Skin: Warm and dry, no rash. Neuro:  Strength and sensation are intact. Psych: Normal affect.  Assessment & Plan    CAD -Nonobstructive -LDL goal is less than 70 -We will update some labs today and titrate cholesterol meds if indicated -For now continue Praulent 150 mg/mL every 14 days, aspirin 81 mg daily, metoprolol succinate 25 mg daily, omega-3 fatty acids 1000 mg daily, and nitroglycerin as needed -Of note did not tolerate atorvastatin  GERD -continue current treatment plan  Hyperlipidemia -Update lipid panel and LFTs -continue current medications         Disposition: Follow up 6 months with Candee Furbish, MD or APP.  Signed, Elgie Collard, PA-C 07/31/2022, 12:54 PM [facility]

## 2022-07-31 ENCOUNTER — Ambulatory Visit: Payer: Medicare Other | Attending: Cardiology | Admitting: Physician Assistant

## 2022-07-31 ENCOUNTER — Telehealth: Payer: Self-pay | Admitting: Physician Assistant

## 2022-07-31 ENCOUNTER — Encounter: Payer: Self-pay | Admitting: Physician Assistant

## 2022-07-31 VITALS — BP 110/68 | HR 67 | Ht 61.0 in | Wt 143.0 lb

## 2022-07-31 DIAGNOSIS — I2510 Atherosclerotic heart disease of native coronary artery without angina pectoris: Secondary | ICD-10-CM | POA: Diagnosis not present

## 2022-07-31 DIAGNOSIS — K219 Gastro-esophageal reflux disease without esophagitis: Secondary | ICD-10-CM

## 2022-07-31 DIAGNOSIS — E785 Hyperlipidemia, unspecified: Secondary | ICD-10-CM

## 2022-07-31 MED ORDER — METOPROLOL SUCCINATE ER 25 MG PO TB24: 25.0000 mg | ORAL_TABLET | Freq: Every day | ORAL | 3 refills | Status: DC

## 2022-07-31 MED ORDER — NITROGLYCERIN 0.4 MG SL SUBL
0.4000 mg | SUBLINGUAL_TABLET | SUBLINGUAL | 3 refills | Status: AC | PRN
Start: 2022-07-31 — End: ?

## 2022-07-31 NOTE — Patient Instructions (Signed)
Medication Instructions:  1.Stop atorvastatin (Lipitor) *If you need a refill on your cardiac medications before your next appointment, please call your pharmacy*   Lab Work: FASTING LIPIDS, LFT'S, CBC, CMET, MAG ON FRIDAY. YOU CAN COME ANYTIME BETWEEN 7:15 AM-4:30 PM If you have labs (blood work) drawn today and your tests are completely normal, you will receive your results only by: [place] (if you have MyChart) OR A paper copy in the mail If you have any lab test that is abnormal or we need to change your treatment, we will call you to review the results.   Follow-Up: At [facility], you and your health needs are our priority.  As part of our continuing mission to provide you with exceptional heart care, we have created designated Provider Care Teams.  These Care Teams include your primary Cardiologist (physician) and Advanced Practice Providers (APPs -  Physician Assistants and Nurse Practitioners) who all work together to provide you with the care you need, when you need it.  We recommend signing up for the patient portal called "MyChart".  Sign up information is provided on this After Visit Summary.  MyChart is used to connect with patients for Virtual Visits (Telemedicine).  Patients are able to view lab/test results, encounter notes, upcoming appointments, etc.  Non-urgent messages can be sent to your provider as well.   To learn more about what you can do with MyChart, go to [email or web address].    Your next appointment:   6 month(s)  Provider:   Candee Furbish, MD    [employer] Diet A Mediterranean diet refers to food and lifestyle choices that are based on the traditions of countries located on the [place]. It focuses on eating more fruits, vegetables, whole grains, beans, nuts, seeds, and heart-healthy fats, and eating less dairy, meat, eggs, and processed foods with added sugar, salt, and fat. This way of eating has been shown to help prevent  certain conditions and improve outcomes for people who have chronic diseases, like kidney disease and heart disease. What are tips for following this plan? Reading food labels Check the serving size of packaged foods. For foods such as rice and pasta, the serving size refers to the amount of cooked product, not dry. Check the total fat in packaged foods. Avoid foods that have saturated fat or trans fats. Check the ingredient list for added sugars, such as corn syrup. Shopping  Buy a variety of foods that offer a balanced diet, including: Fresh fruits and vegetables (produce). Grains, beans, nuts, and seeds. Some of these may be available in unpackaged forms or large amounts (in bulk). Fresh seafood. Poultry and eggs. Low-fat dairy products. Buy whole ingredients instead of prepackaged foods. Buy fresh fruits and vegetables in-season from local farmers markets. Buy plain frozen fruits and vegetables. If you do not have access to quality fresh seafood, buy precooked frozen shrimp or canned fish, such as tuna, salmon, or sardines. Stock your pantry so you always have certain foods on hand, such as olive oil, canned tuna, canned tomatoes, rice, pasta, and beans. Cooking Cook foods with extra-virgin olive oil instead of using butter or other vegetable oils. Have meat as a side dish, and have vegetables or grains as your main dish. This means having meat in small portions or adding small amounts of meat to foods like pasta or stew. Use beans or vegetables instead of meat in common dishes like chili or lasagna. Experiment with different cooking methods. Try roasting, broiling, steaming, and sauting  vegetables. Add frozen vegetables to soups, stews, pasta, or rice. Add nuts or seeds for added healthy fats and plant protein at each meal. You can add these to yogurt, salads, or vegetable dishes. Marinate fish or vegetables using olive oil, lemon juice, garlic, and fresh herbs. Meal planning Plan  to eat one vegetarian meal one day each week. Try to work up to two vegetarian meals, if possible. Eat seafood two or more times a week. Have healthy snacks readily available, such as: Vegetable sticks with hummus. Greek yogurt. Fruit and nut trail mix. Eat balanced meals throughout the week. This includes: Fruit: 2-3 servings a day. Vegetables: 4-5 servings a day. Low-fat dairy: 2 servings a day. Fish, poultry, or lean meat: 1 serving a day. Beans and legumes: 2 or more servings a week. Nuts and seeds: 1-2 servings a day. Whole grains: 6-8 servings a day. Extra-virgin olive oil: 3-4 servings a day. Limit red meat and sweets to only a few servings a month. Lifestyle  Cook and eat meals together with your family, when possible. Drink enough fluid to keep your urine pale yellow. Be physically active every day. This includes: Aerobic exercise like running or swimming. Leisure activities like gardening, walking, or housework. Get 7-8 hours of sleep each night. If recommended by your health care provider, drink red wine in moderation. This means 1 glass a day for nonpregnant women and 2 glasses a day for men. A glass of wine equals 5 oz (150 mL). What foods should I eat? Fruits Apples. Apricots. Avocado. Berries. Bananas. Cherries. Dates. Figs. Grapes. Lemons. Melon. Oranges. Peaches. Plums. Pomegranate. Vegetables Artichokes. Beets. Broccoli. Cabbage. Carrots. Eggplant. Green beans. Chard. Kale. Spinach. Onions. Leeks. Peas. Squash. Tomatoes. Peppers. Radishes. Grains Whole-grain pasta. Brown rice. Bulgur wheat. Polenta. Couscous. Whole-wheat bread. [place]. Meats and other proteins Beans. Almonds. Sunflower seeds. Pine nuts. Peanuts. [place]. Salmon. Scallops. Shrimp. [place]. Tilapia. Clams. Oysters. Eggs. Poultry without skin. Dairy Low-fat milk. Cheese. Greek yogurt. Fats and oils Extra-virgin olive oil. Avocado oil. Grapeseed oil. Beverages Water. Red wine. Herbal  tea. Sweets and desserts Greek yogurt with honey. Baked apples. Poached pears. Trail mix. Seasonings and condiments Basil. Cilantro. Coriander. Cumin. Mint. Parsley. Sage. Rosemary. Tarragon. Garlic. Oregano. Thyme. Pepper. Balsamic vinegar. Tahini. Hummus. Tomato sauce. Olives. Mushrooms. The items listed above may not be a complete list of foods and beverages you can eat. Contact a dietitian for more information. What foods should I limit? This is a list of foods that should be eaten rarely or only on special occasions. Fruits Fruit canned in syrup. Vegetables Deep-fried potatoes (french fries). Grains Prepackaged pasta or rice dishes. Prepackaged cereal with added sugar. Prepackaged snacks with added sugar. Meats and other proteins Beef. Pork. Lamb. Poultry with skin. Hot dogs. Berniece Salines. Dairy Ice cream. Sour cream. Whole milk. Fats and oils Butter. Canola oil. Vegetable oil. Beef fat (tallow). Lard. Beverages Juice. Sugar-sweetened soft drinks. Beer. Liquor and spirits. Sweets and desserts Cookies. Cakes. Pies. Candy. Seasonings and condiments Mayonnaise. Pre-made sauces and marinades. The items listed above may not be a complete list of foods and beverages you should limit. Contact a dietitian for more information. Summary The Mediterranean diet includes both food and lifestyle choices. Eat a variety of fresh fruits and vegetables, beans, nuts, seeds, and whole grains. Limit the amount of red meat and sweets that you eat. If recommended by your health care provider, drink red wine in moderation. This means 1 glass a day for nonpregnant women and 2 glasses a  day for men. A glass of wine equals 5 oz (150 mL). This information is not intended to replace advice given to you by your health care provider. Make sure you discuss any questions you have with your health care provider. Document Revised: 06/11/2019 Document Reviewed: 04/08/2019 Elsevier Patient Education  [place].

## 2022-07-31 NOTE — Telephone Encounter (Signed)
Patient called to see if she can get help to pay for medication that was prescribed  Alirocumab (PRALUENT) 150 MG/ML SOAJ

## 2022-07-31 NOTE — Telephone Encounter (Signed)
[facility] renewed- Called pt and gave her the info.  CARD NO. [id number]   CARD STATUS Active   BIN [id number]   PCN [id number]   PC GROUP [id number]

## 2022-08-02 ENCOUNTER — Ambulatory Visit: Payer: Medicare Other | Attending: Physician Assistant

## 2022-08-07 ENCOUNTER — Encounter: Payer: Self-pay | Admitting: Gastroenterology

## 2022-08-07 ENCOUNTER — Ambulatory Visit (INDEPENDENT_AMBULATORY_CARE_PROVIDER_SITE_OTHER): Payer: Medicare Other | Admitting: Gastroenterology

## 2022-08-07 VITALS — BP 106/70 | HR 73 | Ht 61.0 in | Wt 142.6 lb

## 2022-08-07 DIAGNOSIS — R109 Unspecified abdominal pain: Secondary | ICD-10-CM | POA: Diagnosis not present

## 2022-08-07 DIAGNOSIS — R1010 Upper abdominal pain, unspecified: Secondary | ICD-10-CM | POA: Diagnosis not present

## 2022-08-07 DIAGNOSIS — R141 Gas pain: Secondary | ICD-10-CM

## 2022-08-07 MED ORDER — ESOMEPRAZOLE MAGNESIUM 40 MG PO CPDR: 40.0000 mg | DELAYED_RELEASE_CAPSULE | Freq: Two times a day (BID) | ORAL | 0 refills | Status: DC

## 2022-08-07 NOTE — Patient Instructions (Addendum)
We have sent the following medications to your pharmacy for you to pick up at your convenience: Nexum 40: twice daily 30 minutes before meals.  If no difference with the Nexum, please try gas-x extra strength 2-3 times daily   We have scheduled you a follow up for 10/02/2022 at 11:00am with Colleen-Kennedy _______________________________________________________  If your blood pressure at your visit was 140/90 or greater, please contact your primary care physician to follow up on this.  _______________________________________________________  If you are age 25 or older, your body mass index should be between 23-30. Your Body mass index is 26.94 kg/m. If this is out of the aforementioned range listed, please consider follow up with your Primary Care Provider.  If you are age 24 or younger, your body mass index should be between 19-25. Your Body mass index is 26.94 kg/m. If this is out of the aformentioned range listed, please consider follow up with your Primary Care Provider.   ________________________________________________________  The [facility] providers would like to encourage you to use [facility] to communicate with providers for non-urgent requests or questions.  Due to long hold times on the telephone, sending your provider a message by [facility] may be a faster and more efficient way to get a response.  Please allow 48 business hours for a response.  Please remember that this is for non-urgent requests.  _______________________________________________________ It was a pleasure to see you today!  Thank you for trusting me with your gastrointestinal care!

## 2022-08-07 NOTE — Progress Notes (Signed)
GASTROENTEROLOGY OUTPATIENT CLINIC VISIT   Primary Care Provider Georgianne Fick, MD [address] [place] [address] [phone]   Patient Profile: Dominique Mooney is a 72 y.o. female with a pmh significant for CAD, hypertension, hypothyroidism, hyperlipidemia, status post cholecystectomy, GERD, colon polyps.  The patient presents to the [facility] for an evaluation and management of problem(s) noted below:  Problem List 1. Pain of upper abdomen   2. Abdominal cramping   3. Abdominal gas pain     History of Present Illness Please see prior notes for full details of HPI.  Interval History The patient returns for follow-up.  Her right upper quadrant discomfort that has been a more significant issue for her as not been an issue for the last few months.  She has been feeling a sensation of abdominal cramping and discomfort in the mid abdomen.  She describes some gas and mild bloating.  This is not a prandial or postprandial or fasting discomfort that occurs rather at any time of day.  She has not been feeling any new acid indigestion.  She has not noted any changes in her bowel habits.  GI Review of Systems Positive as above Negative for nausea, vomiting, dysphagia, odynophagia, melena, hematochezia  Review of Systems General: Denies fevers/chills/weight loss unintentionally Cardiovascular: Denies chest pain Pulmonary: Denies shortness of breath Gastroenterological: See HPI Genitourinary: Denies darkened urine  Hematological: Denies easy bruising/bleeding Dermatological: Denies jaundice Psychological: Mood is stable   Medications Current Outpatient Medications  Medication Sig Dispense Refill   albuterol (PROVENTIL HFA) 108 (90 Base) MCG/ACT inhaler INHALE 2 PUFFS INTO THE LUNGS EVERY 6 (SIX) HOURS AS NEEDED. 1 Inhaler 3   Alirocumab (PRALUENT) 150 MG/ML SOAJ Inject 1 pen  into the skin every 14 (fourteen) days. 6 mL 3   aspirin  81 MG EC tablet Take 81 mg by mouth daily.     budesonide-formoterol (SYMBICORT) 80-4.5 MCG/ACT inhaler Inhale 2 puffs into the lungs 2 (two) times daily. 1 Inhaler 3   cetirizine (ZYRTEC) 10 MG tablet Take 10 mg by mouth daily as needed.      dicyclomine (BENTYL) 10 MG capsule TAKE 1 CAPSULE (10 MG TOTAL) BY MOUTH 2 (TWO) TIMES DAILY AS NEEDED FOR SPASMS (ABDOMINAL PAIN). 180 capsule 1   esomeprazole (NEXIUM) 40 MG capsule Take 1 capsule (40 mg total) by mouth 2 (two) times daily before a meal. 60 capsule 0   fish oil-omega-3 fatty acids 1000 MG capsule Take 1 g by mouth daily.      fluticasone (FLONASE) 50 MCG/ACT nasal spray Place 2 sprays into both nostrils daily. 16 g 2   levothyroxine (SYNTHROID) 75 MCG tablet Take 75 mcg by mouth daily.     metoprolol succinate (TOPROL-XL) 25 MG 24 hr tablet Take 1 tablet (25 mg total) by mouth daily. 90 tablet 3   Multiple Vitamin (MULITIVITAMIN WITH MINERALS) TABS Take 1 tablet by mouth daily.      nitroGLYCERIN (NITROSTAT) 0.4 MG SL tablet Place 1 tablet (0.4 mg total) under the tongue every 5 (five) minutes as needed for chest pain. 25 tablet 3   No current facility-administered medications for this visit.    Allergies Allergies  Allergen Reactions   Statins Other (See Comments)    Elevated Liver functions   Penicillins Rash   Lipitor [Atorvastatin] Other (See Comments)    Abdominal bloating    Histories Past Medical History:  Diagnosis Date   Allergic rhinitis  Arthritis    Asthma        Chest pressure    a. Normal Stress Echo 05/2010   Complication of anesthesia    GERD (gastroesophageal reflux disease)    Hyperlipidemia    Hypertension    Hypothyroidism    Irritable bowel syndrome    Pancreatitis    after ERCP   Pneumonia    PONV (postoperative nausea and vomiting)    Past Surgical History:  Procedure Laterality Date   BRAVO [facility] STUDY  05/02/2011   Procedure: BRAVO PH STUDY;  Surgeon: Rob Bunting, MD;  Location: [facility]  ENDOSCOPY;  Service: Endoscopy;  Laterality: N/A;  48 hour wirless pH test (Bravo Test)   CHOLECYSTECTOMY     ESOPHAGOGASTRODUODENOSCOPY  05/02/2011   Procedure: ESOPHAGOGASTRODUODENOSCOPY (EGD);  Surgeon: Rob Bunting, MD;  Location: [facility];  Service: Endoscopy;  Laterality: N/A;   HERNIA REPAIR     LEFT HEART CATH AND CORONARY ANGIOGRAPHY N/A 06/26/2017   Procedure: LEFT HEART CATH AND CORONARY ANGIOGRAPHY;  Surgeon: Lyn Records, MD;  Location: [facility] INVASIVE CV LAB;  Service: Cardiovascular;  Laterality: N/A;   MENISCUS REPAIR Left 03/18/2016   and repair of cracked bone   SALPINGOOPHORECTOMY     TOTAL ABDOMINAL HYSTERECTOMY     TOTAL KNEE ARTHROPLASTY Left 07/16/2019   Procedure: TOTAL KNEE ARTHROPLASTY;  Surgeon: Eugenia Mcalpine, MD;  Location: [facility] ORS;  Service: Orthopedics;  Laterality: Left;   Social History   Socioeconomic History   Marital status: Married    Spouse name: Not on file   Number of children: 2   Years of education: Not on file   Highest education level: Not on file  Occupational History   Occupation: retired    Associate Professor: [employer]  Tobacco Use   Smoking status: Never   Smokeless tobacco: Never  Vaping Use   Vaping Use: Never used  Substance and Sexual Activity   Alcohol use: No   Drug use: No   Sexual activity: Not on file  Other Topics Concern   Not on file  Social History Narrative   Works as Scientist, 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   Currently retired as of 07/2012      [facility] (11/26/16):   Originally from [place] [place] [place]. Has always lived in [place]. She retired from [employer]. 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 No pets currently. No bird exposure. No mold or hot tub exposure. Does have carpet in her bedroom. No feather bedding. She has blinds & draperies. No indoor plants. Enjoys helping out in her church and with nonprofit work. Enjoys going to the beach.    Social Determinants of Health    Financial Resource Strain: Not on file  Food Insecurity: Not on file  Transportation Needs: Not on file  Physical Activity: Not on file  Stress: Not on file  Social Connections: Not on file  Intimate Partner Violence: Not on file   Family History  Problem Relation Age of Onset   Stroke Mother    Stroke Father    Allergies Sister    Colon polyps Sister    Allergies Brother    Heart attack Brother    Heart attack Brother    Allergies Son    Colon cancer Neg Hx    Stomach cancer Neg Hx    Esophageal cancer Neg Hx    Lung disease Neg Hx    Cancer Neg Hx    Rectal  cancer Neg Hx    Breast cancer Neg Hx    Inflammatory bowel disease Neg Hx    Liver disease Neg Hx    Pancreatic cancer Neg Hx    I have reviewed her medical, social, and family history in detail and updated the electronic medical record as necessary.    PHYSICAL EXAMINATION  BP 106/70   Pulse 73   Ht 5\' 1"  (1.549 m)   Wt 142 lb 9.6 oz (64.7 kg)   BMI 26.94 kg/m  Wt Readings from Last 3 Encounters:  08/07/22 142 lb 9.6 oz (64.7 kg)  07/31/22 143 lb (64.9 kg)  06/12/22 141 lb (64 kg)  GEN: NAD, appears stated age, doesn't appear chronically ill PSYCH: Cooperative, without pressured speech EYE: Conjunctivae pink, sclerae anicteric ENT: MMM CV: Nontachycardic RESP: No audible wheezing GI: NABS, soft, NT/ND, without rebound or guarding MSK/EXT: No significant lower extremity edema SKIN: No jaundice NEURO:  Alert & Oriented x 3, no focal deficits   REVIEW OF DATA  I reviewed the following data at the time of this encounter:  GI Procedures and Studies  No new studies to review  Laboratory Studies  Reviewed those in epic  Imaging Studies  No new imaging to review   ASSESSMENT  Ms. Licano is a 72 y.o. female with a pmh significant for CAD, hypertension, hypothyroidism, hyperlipidemia, status post cholecystectomy, GERD, colon polyps.  The patient is seen today for evaluation and management of:  1.  Pain of upper abdomen   2. Abdominal cramping   3. Abdominal gas pain    The patient is hemodynamically and clinically stable.  The discomfort that she has previously been seen for in regards to her right upper quadrant has not been a significant issue currently.  With this being said she has had some increased amount of mid/upper abdominal that has been associated with some gas but not overt bloating.  The etiology of this is not clear.  We are going to increase her PPI for a 1 month trial to see if it makes any difference for her.  If it does not she will stop using it twice daily.  After that if she still having symptoms she will trial Gas-X extra strength scheduled for 3 to 4 weeks.  If this does not help then additional workup including a CT scan and potentially updated endoscopy will be considered.  When we have checked her LFTs previously they were normal think that discomfort is grossly related.  All patient questions were answered to the best of my ability, and the patient agrees to the aforementioned plan of action with follow-up as indicated.   PLAN  Increase PPI to twice daily If patient has persisting/progressive symptoms we will consider repeat cross-sectional imaging (last in 2022) as well as upper endoscopy   No orders of the defined types were placed in this encounter.   New Prescriptions   ESOMEPRAZOLE (NEXIUM) 40 MG CAPSULE    Take 1 capsule (40 mg total) by mouth 2 (two) times daily before a meal.   Modified Medications   No medications on file    Planned Follow Up No follow-ups on file.   Total Time in Face-to-Face and in Coordination of Care for patient including independent/personal interpretation/review of prior testing, medical history, examination, medication adjustment, communicating results with the patient directly, and documentation within the EHR is 25 minutes.   Corliss Parish, MD [facility] Office # [phone]

## 2022-08-09 ENCOUNTER — Ambulatory Visit: Payer: Medicare Other | Attending: Physician Assistant

## 2022-08-09 DIAGNOSIS — E785 Hyperlipidemia, unspecified: Secondary | ICD-10-CM

## 2022-08-09 DIAGNOSIS — I2510 Atherosclerotic heart disease of native coronary artery without angina pectoris: Secondary | ICD-10-CM

## 2022-08-09 DIAGNOSIS — K219 Gastro-esophageal reflux disease without esophagitis: Secondary | ICD-10-CM

## 2022-08-10 ENCOUNTER — Encounter: Payer: Self-pay | Admitting: Gastroenterology

## 2022-08-10 DIAGNOSIS — R109 Unspecified abdominal pain: Secondary | ICD-10-CM | POA: Insufficient documentation

## 2022-08-10 DIAGNOSIS — R1010 Upper abdominal pain, unspecified: Secondary | ICD-10-CM | POA: Insufficient documentation

## 2022-08-10 DIAGNOSIS — R141 Gas pain: Secondary | ICD-10-CM | POA: Insufficient documentation

## 2022-08-10 LAB — COMPREHENSIVE METABOLIC PANEL
ALT: 6 IU/L (ref 0–32)
AST: 21 IU/L (ref 0–40)
Albumin/Globulin Ratio: 1.5 (ref 1.2–2.2)
Albumin: 4 g/dL (ref 3.9–4.9)
Alkaline Phosphatase: 77 IU/L (ref 44–121)
BUN/Creatinine Ratio: 9 — ABNORMAL LOW (ref 12–28)
BUN: 9 mg/dL (ref 8–27)
Bilirubin Total: 0.7 mg/dL (ref 0.0–1.2)
CO2: 27 mmol/L (ref 20–29)
Calcium: 9.4 mg/dL (ref 8.7–10.3)
Chloride: 104 mmol/L (ref 96–106)
Creatinine, Ser: 0.96 mg/dL (ref 0.57–1.00)
Globulin, Total: 2.6 g/dL (ref 1.5–4.5)
Glucose: 90 mg/dL (ref 70–99)
Potassium: 4.4 mmol/L (ref 3.5–5.2)
Sodium: 142 mmol/L (ref 134–144)
Total Protein: 6.6 g/dL (ref 6.0–8.5)
eGFR: 64 mL/min/{1.73_m2} (ref >59)

## 2022-08-10 LAB — CBC
Hematocrit: 36.3 % (ref 34.0–46.6)
Hemoglobin: 11.9 g/dL (ref 11.1–15.9)
MCH: 30.2 pg (ref 26.6–33.0)
MCHC: 32.8 g/dL (ref 31.5–35.7)
MCV: 92 fL (ref 79–97)
Platelets: 226 10*3/uL (ref 150–450)
RBC: 3.94 x10E6/uL (ref 3.77–5.28)
RDW: 11.3 % — ABNORMAL LOW (ref 11.7–15.4)
WBC: 4.6 10*3/uL (ref 3.4–10.8)

## 2022-08-10 LAB — LIPID PANEL
Chol/HDL Ratio: 2.6 ratio (ref 0.0–4.4)
Cholesterol, Total: 161 mg/dL (ref 100–199)
HDL: 62 mg/dL (ref >39)
LDL Chol Calc (NIH): 85 mg/dL (ref 0–99)
Triglycerides: 72 mg/dL (ref 0–149)
VLDL Cholesterol Cal: 14 mg/dL (ref 5–40)

## 2022-08-10 LAB — MAGNESIUM: Magnesium: 2.2 mg/dL (ref 1.6–2.3)

## 2022-08-10 LAB — HEPATIC FUNCTION PANEL: Bilirubin, Direct: 0.18 mg/dL (ref 0.00–0.40)

## 2022-08-12 ENCOUNTER — Telehealth: Payer: Self-pay | Admitting: Cardiology

## 2022-08-12 ENCOUNTER — Telehealth: Payer: Self-pay

## 2022-08-12 DIAGNOSIS — E785 Hyperlipidemia, unspecified: Secondary | ICD-10-CM

## 2022-08-12 NOTE — Telephone Encounter (Signed)
Patient is aware that we are for provider response about starting a new medication to help lowering her LDL.

## 2022-08-12 NOTE — Telephone Encounter (Signed)
Patient is returning call.  °

## 2022-08-12 NOTE — Telephone Encounter (Signed)
She states she does not eat fried foods, sweets and she goes walking. She stated the only thing left will be to try a new medication

## 2022-08-15 ENCOUNTER — Ambulatory Visit: Payer: Medicare Other | Admitting: Gastroenterology

## 2022-08-21 MED ORDER — ROSUVASTATIN CALCIUM 5 MG PO TABS: 5.0000 mg | ORAL_TABLET | Freq: Every day | ORAL | 3 refills | Status: DC

## 2022-08-21 NOTE — Telephone Encounter (Signed)
I spoke with patient and she was ok starting crestor but when I went to order medication allergy to statins populated. Please advise?

## 2022-08-21 NOTE — Addendum Note (Signed)
Addended by: Rogelia Mire R on: 08/21/2022 10:03 AM   Modules accepted: Orders

## 2022-09-04 ENCOUNTER — Other Ambulatory Visit: Payer: Self-pay | Admitting: Gastroenterology

## 2022-09-18 ENCOUNTER — Other Ambulatory Visit: Payer: Self-pay | Admitting: Gastroenterology

## 2022-10-02 ENCOUNTER — Ambulatory Visit (INDEPENDENT_AMBULATORY_CARE_PROVIDER_SITE_OTHER): Payer: Medicare Other | Admitting: Nurse Practitioner

## 2022-10-02 ENCOUNTER — Encounter: Payer: Self-pay | Admitting: Nurse Practitioner

## 2022-10-02 VITALS — BP 118/68 | HR 60 | Ht 61.0 in | Wt 140.0 lb

## 2022-10-02 DIAGNOSIS — M25562 Pain in left knee: Secondary | ICD-10-CM | POA: Diagnosis not present

## 2022-10-02 DIAGNOSIS — K219 Gastro-esophageal reflux disease without esophagitis: Secondary | ICD-10-CM

## 2022-10-02 DIAGNOSIS — Z96652 Presence of left artificial knee joint: Secondary | ICD-10-CM | POA: Diagnosis not present

## 2022-10-02 DIAGNOSIS — G5782 Other specified mononeuropathies of left lower limb: Secondary | ICD-10-CM | POA: Diagnosis not present

## 2022-10-02 DIAGNOSIS — R109 Unspecified abdominal pain: Secondary | ICD-10-CM | POA: Diagnosis not present

## 2022-10-02 NOTE — Patient Instructions (Addendum)
Continue to eat a healthy diet and exercise as tolerated on a regular basis   Restart Esomeprazole 40mg  once daily if your reflux symptoms or upper abdominal pain recurs   Follow up as needed.  Thank you for trusting me with your gastrointestinal care!   Alcide Evener, CRNP

## 2022-10-02 NOTE — Progress Notes (Signed)
10/02/2022 Dominique ALPHIN [id number] [birth year]   Chief Complaint: Follow-up abdominal pain  History of Present Illness: Dominique Mooney is a 72 year old female with a past medical history of hypertension, hyperlipidemia, nonobstructive coronary artery disease per cardiac cath in 2019 and coronary CT 07/2021, hypothyroidism, DILI secondary to statin, post ERCP pancreatitis 2022, hepatic steatosis, GERD and colon polyps. Past cholecystectomy.   She was last seen by Dr. Meridee Mooney 08/07/2022. At that time, her RUQ pain abated and she noted having increased mid upper abdominal pain associated with gas without bloating. She was instructed to take Esomeprazole 40 mg p.o. twice daily for 4 weeks and if no improvement to take Gas-X extra strength for 3 to 4 weeks. Dr. Meridee Mooney considered repeating CTAP and EGD if her symptoms persisted or worsened.  She presents today for further follow-up.  She stated her central upper abdominal discomfort and gas completely abated after she took as Esomeprazole 40 mg twice daily and she also took a few doses of Gas-X.  Over the past 2 to 3 months, she is walking 2 miles at the gym 3 days weekly and she is eating a healthy diet.  She is eating more fruits and vegetables and she grills her meats.  She denies eating any fast foods.  She denies having any acid reflux symptoms or abdominal pain.  She has lost 2 to 3 pounds which she is not concerned about and is likely due to exercise and diet changes.  She is passing normal formed brown bowel movement daily.  No rectal bleeding or black stools.  Overall, she feels quite well.  EGD 01/11/2019 which showed gastritis, biopsies were negative for H. pylori.  The esophagus was normal.    Colonoscopy 01/11/2019: identified a 4 mm hyperplastic polyp which was removed from the descending colon and left-sided diverticulosis.      Latest Ref Rng & Units 08/09/2022    9:13 AM 08/17/2021    4:43 PM 08/07/2021   10:54 AM  CBC  WBC 3.4  - 10.8 x10E3/uL 4.6  3.5  4.3   Hemoglobin 11.1 - 15.9 g/dL 40.9  81.1  91.4   Hematocrit 34.0 - 46.6 % 36.3  39.1  38.0   Platelets 150 - 450 x10E3/uL 226  207  209.0        Latest Ref Rng & Units 08/09/2022    9:13 AM 06/12/2022    2:23 PM 08/17/2021    4:43 PM  CMP  Glucose 70 - 99 mg/dL 90  782  95   BUN 8 - 27 mg/dL 9  10  13    Creatinine 0.57 - 1.00 mg/dL 9.56  2.13  0.86   Sodium 134 - 144 mmol/L 142  139  140   Potassium 3.5 - 5.2 mmol/L 4.4  3.7  3.5   Chloride 96 - 106 mmol/L 104  102  106   CO2 20 - 29 mmol/L 27  31  27    Calcium 8.7 - 10.3 mg/dL 9.4  9.2  8.8   Total Protein 6.0 - 8.5 g/dL 6.6  7.3    Total Bilirubin 0.0 - 1.2 mg/dL 0.7  0.4    Alkaline Phos 44 - 121 IU/L 77  68    AST 0 - 40 IU/L 21  22    ALT 0 - 32 IU/L 6  5      RUQ sonogram 09/24/2021: Gallbladder: Surgically absent  Common bile duct: Diameter: 6 mm  Liver: Parenchymal  echogenicity: Mild heterogeneity of hepatic parenchyma. Lesions: None  Portal vein: Patent.  Hepatopetal flow  Other: None.  IMPRESSION: Nonspecific mild heterogeneity of the hepatic parenchyma. No focal hepatic lesion.   CTAP 07/31/2020:  Multidetector CT imaging of the abdomen and pelvis was performed using the standard protocol following bolus administration of intravenous contrast.   CONTRAST:  OMNIPAQUE IOHEXOL 300 MG/ML  SOLN   COMPARISON:  02/27/2017.   FINDINGS: Lower chest: Lung bases are clear. Heart is enlarged. No pericardial or pleural effusion.   Hepatobiliary: Liver may be slightly decreased in attenuation diffusely. Cholecystectomy. No biliary ductal dilatation.   Pancreas: Negative.   Spleen: Negative.   Adrenals/Urinary Tract: Adrenal glands and kidneys are unremarkable. Ureters are decompressed. Bladder is grossly unremarkable.   Stomach/Bowel: Stomach, small bowel and colon are unremarkable. Appendix is not visualized.   Vascular/Lymphatic: Atherosclerotic calcification of the aorta.  No pathologically enlarged lymph nodes.   Reproductive: Hysterectomy.  No adnexal mass.   Other: No free fluid.  Mesenteries and peritoneum are unremarkable.   Musculoskeletal: Degenerative changes in the spine. No worrisome lytic or sclerotic lesions.   IMPRESSION: 1. No findings to explain the patient's clinical history. 2. Hepatic steatosis. 3.  Aortic atherosclerosis   Current Outpatient Medications on File Prior to Visit  Medication Sig Dispense Refill   albuterol (PROVENTIL HFA) 108 (90 Base) MCG/ACT inhaler INHALE 2 PUFFS INTO THE LUNGS EVERY 6 (SIX) HOURS AS NEEDED. 1 Inhaler 3   aspirin 81 MG EC tablet Take 81 mg by mouth daily.     budesonide-formoterol (SYMBICORT) 80-4.5 MCG/ACT inhaler Inhale 2 puffs into the lungs 2 (two) times daily. 1 Inhaler 3   fish oil-omega-3 fatty acids 1000 MG capsule Take 1 g by mouth daily.      levothyroxine (SYNTHROID) 75 MCG tablet Take 75 mcg by mouth daily.     metoprolol succinate (TOPROL-XL) 25 MG 24 hr tablet Take 1 tablet (25 mg total) by mouth daily. 90 tablet 3   Multiple Vitamin (MULITIVITAMIN WITH MINERALS) TABS Take 1 tablet by mouth daily.      rosuvastatin (CRESTOR) 5 MG tablet Take 1 tablet (5 mg total) by mouth daily. 90 tablet 3   Alirocumab (PRALUENT) 150 MG/ML SOAJ Inject 1 pen  into the skin every 14 (fourteen) days. (Patient not taking: Reported on 10/02/2022) 6 mL 3   cetirizine (ZYRTEC) 10 MG tablet Take 10 mg by mouth daily as needed.  (Patient not taking: Reported on 10/02/2022)     dicyclomine (BENTYL) 10 MG capsule TAKE 1 CAPSULE (10 MG TOTAL) BY MOUTH 2 (TWO) TIMES DAILY AS NEEDED FOR SPASMS (ABDOMINAL PAIN). (Patient not taking: Reported on 10/02/2022) 180 capsule 1   esomeprazole (NEXIUM) 40 MG capsule TAKE 1 CAPSULE (40 MG TOTAL) BY MOUTH 2 (TWO) TIMES DAILY BEFORE A MEAL. (Patient not taking: Reported on 10/02/2022) 60 capsule 0   fluticasone (FLONASE) 50 MCG/ACT nasal spray Place 2 sprays into both nostrils daily.  (Patient not taking: Reported on 10/02/2022) 16 g 2   nitroGLYCERIN (NITROSTAT) 0.4 MG SL tablet Place 1 tablet (0.4 mg total) under the tongue every 5 (five) minutes as needed for chest pain. (Patient not taking: Reported on 10/02/2022) 25 tablet 3   No current facility-administered medications on file prior to visit.   Allergies  Allergen Reactions   Statins Other (See Comments)    Elevated Liver functions   Penicillins Rash   Lipitor [Atorvastatin] Other (See Comments)    Abdominal bloating  Current Medications, Allergies, Past Medical History, Past Surgical History, Family History and Social History were reviewed in [employer] record.  Review of Systems:   Constitutional: Negative for fever, sweats or chills. Three pound weight loss past few months.  Respiratory: Negative for shortness of breath.   Cardiovascular: Negative for chest pain, palpitations and leg swelling.  Gastrointestinal: See HPI.  Musculoskeletal: Negative for back pain or muscle aches.  Neurological: Negative for dizziness, headaches or paresthesias.   Physical Exam: Ht 5\' 1"  (1.549 m)   Wt 140 lb (63.5 kg)   BMI 26.45 kg/m   Wt Readings from Last 3 Encounters:  10/02/22 140 lb (63.5 kg)  08/07/22 142 lb 9.6 oz (64.7 kg)  07/31/22 143 lb (64.9 kg)    General: 72 year old female in no acute distress. Head: Normocephalic and atraumatic. Eyes: No scleral icterus. Conjunctiva pink . Ears: Normal auditory acuity. Mouth: Dentition intact. No ulcers or lesions.  Lungs: Clear throughout to auscultation. Heart: Regular rate and rhythm, loud S2, no murmur. Abdomen: Soft, nontender and nondistended. No masses or hepatomegaly. Normal bowel sounds x 4 quadrants.  Rectal: Deferred. Musculoskeletal: Symmetrical with no gross deformities. Extremities: No edema. Neurological: Alert oriented x 4. No focal deficits.  Psychological: Alert and cooperative. Normal mood and affect  Assessment and  Recommendations:  72 year old female with GERD.  Prior RUQ resolved.central mid abdominal discomfort/cramping abated after she took PPI twice daily for 1 month and a few doses of Gas-X.  Upper GI symptoms also improved due to dietary changes and exercise. EGD 01/11/2019 showed gastritis, no evidence of H. Pylori.  -Continue to walk 2 miles 3 days weekly and maintain a healthy diet -Restart Esomeprazole 40 mg once daily if acid reflux or upper abdominal pain/discomforts recur -Extra strength Gas-X as needed -Follow up as needed   Hepatic steatosis.  Normal LFTs.   Nonobstructive CAD.   Hyperplastic polyp per colonoscopy 12/2018 -Next colonoscopy due 12/2028

## 2022-10-03 ENCOUNTER — Ambulatory Visit: Payer: Medicare Other | Attending: Cardiology

## 2022-10-03 DIAGNOSIS — E785 Hyperlipidemia, unspecified: Secondary | ICD-10-CM | POA: Diagnosis not present

## 2022-10-04 LAB — LIPID PANEL
Chol/HDL Ratio: 2.1 ratio (ref 0.0–4.4)
Cholesterol, Total: 128 mg/dL (ref 100–199)
HDL: 61 mg/dL (ref >39)
LDL Chol Calc (NIH): 56 mg/dL (ref 0–99)
Triglycerides: 48 mg/dL (ref 0–149)
VLDL Cholesterol Cal: 11 mg/dL (ref 5–40)

## 2022-10-04 NOTE — Progress Notes (Signed)
Attending Physician's Attestation   I have reviewed the chart.   I agree with the Advanced Practitioner's note, impression, and recommendations with any updates as below. Glad to hear she is doing better.   Corliss Parish, MD [facility] Office # [phone]

## 2023-01-07 DIAGNOSIS — E039 Hypothyroidism, unspecified: Secondary | ICD-10-CM | POA: Diagnosis not present

## 2023-01-07 DIAGNOSIS — R350 Frequency of micturition: Secondary | ICD-10-CM | POA: Diagnosis not present

## 2023-01-07 DIAGNOSIS — E785 Hyperlipidemia, unspecified: Secondary | ICD-10-CM | POA: Diagnosis not present

## 2023-01-07 DIAGNOSIS — E559 Vitamin D deficiency, unspecified: Secondary | ICD-10-CM | POA: Diagnosis not present

## 2023-01-07 DIAGNOSIS — E1122 Type 2 diabetes mellitus with diabetic chronic kidney disease: Secondary | ICD-10-CM | POA: Diagnosis not present

## 2023-01-08 LAB — LAB REPORT - SCANNED
A1c: 5.7
EGFR: 69

## 2023-01-13 DIAGNOSIS — E039 Hypothyroidism, unspecified: Secondary | ICD-10-CM | POA: Diagnosis not present

## 2023-01-13 DIAGNOSIS — E785 Hyperlipidemia, unspecified: Secondary | ICD-10-CM | POA: Diagnosis not present

## 2023-01-13 DIAGNOSIS — K754 Autoimmune hepatitis: Secondary | ICD-10-CM | POA: Diagnosis not present

## 2023-01-13 DIAGNOSIS — R413 Other amnesia: Secondary | ICD-10-CM | POA: Diagnosis not present

## 2023-01-13 DIAGNOSIS — Z0000 Encounter for general adult medical examination without abnormal findings: Secondary | ICD-10-CM | POA: Diagnosis not present

## 2023-01-13 DIAGNOSIS — E559 Vitamin D deficiency, unspecified: Secondary | ICD-10-CM | POA: Diagnosis not present

## 2023-01-13 DIAGNOSIS — I25118 Atherosclerotic heart disease of native coronary artery with other forms of angina pectoris: Secondary | ICD-10-CM | POA: Diagnosis not present

## 2023-01-15 ENCOUNTER — Telehealth: Payer: Self-pay | Admitting: Nurse Practitioner

## 2023-01-15 NOTE — Telephone Encounter (Signed)
Inbound call from patient stating she received a call from Dominique Mooney. Please advise, thank you.

## 2023-01-15 NOTE — Telephone Encounter (Signed)
The pt last saw [facility].  I have not called the pt Dominique Mooney did you call the pt?

## 2023-01-15 NOTE — Telephone Encounter (Signed)
I do not recall calling this patient.

## 2023-01-27 ENCOUNTER — Ambulatory Visit: Payer: Medicare Other | Attending: Cardiology | Admitting: Cardiology

## 2023-01-28 ENCOUNTER — Encounter: Payer: Self-pay | Admitting: Cardiology

## 2023-02-12 ENCOUNTER — Encounter: Payer: Self-pay | Admitting: Physician Assistant

## 2023-03-19 ENCOUNTER — Ambulatory Visit: Payer: Medicare Other | Admitting: Physician Assistant

## 2023-03-19 ENCOUNTER — Ambulatory Visit: Payer: Medicare Other

## 2023-03-19 DIAGNOSIS — Z029 Encounter for administrative examinations, unspecified: Secondary | ICD-10-CM

## 2023-03-19 NOTE — Progress Notes (Deleted)
Assessment/Plan:     Dominique Mooney is a very pleasant 72 y.o. year old RH female with a history of hypertension, hyperlipidemia, hypothyroidism, asthma, GERD, IBS, CAD, history of autoimmune hepatitis, DM2 with chronic nephropathy, chronic pain, depression, seen today for evaluation of memory loss. MoCA today is .  Workup is in progress.    Memory Impairment  MRI brain without contrast to assess for underlying structural abnormality and assess vascular load  Neurocognitive testing to further evaluate cognitive concerns and determine other underlying cause of memory changes, including potential contribution from sleep, anxiety, attention, or depression among others  Check B12 Recommend CBT for situational depression-grievance Recommend good control of cardiovascular risk factors.  Follow-up with cardiology Continue to control mood as per PCP Folllow up in   Subjective:    The patient is accompanied by ***  who supplements the history.    How long did patient have memory difficulties?  For about.  Patient reports some difficulty remembering new information, recent conversations, names. repeats oneself?  Endorsed Disoriented when walking into a room?  Patient denies ***  Leaving objects in unusual places?  denies   Wandering behavior? denies   Any personality changes, or depression, anxiety? denies*** Hallucinations or paranoia? denies   Seizures? denies    Any sleep changes?  Sleeps well ***/does not sleep well. ***vivid dreams, REM behavior or sleepwalking   Sleep apnea? Denies.   Any hygiene concerns?  Denies.   Independent of bathing and dressing? Endorsed  Does the patient need help with medications?  is in charge *** Who is in charge of the finances?  is in charge   *** Any changes in appetite?   Denies. ***   Patient have trouble swallowing?  Denies.   Does the patient cook? No*** Any headaches?  Denies.   Chronic pain? Denies.   Ambulates with difficulty? Denies  ***  Needs a cane*** Needs a walker *** to ambulate for stability.   Recent falls or head injuries? Denies.     Vision changes?  Denies any new issues.  Has a history of*** Any strokelike symptoms? Denies.   Any tremors? Denies. *** Any anosmia? Denies.   Any incontinence of urine? Denies.   Any bowel dysfunction? Denies.      Patient lives with ***  History of heavy alcohol intake? Denies.   History of heavy tobacco use? Denies.   Family history of dementia?   *** with dementia  Does patient drive? No ***  Allergies  Allergen Reactions   Statins Other (See Comments)    Elevated Liver functions   Penicillins Rash   Lipitor [Atorvastatin] Other (See Comments)    Abdominal bloating    Current Outpatient Medications  Medication Instructions   albuterol (PROVENTIL HFA) 108 (90 Base) MCG/ACT inhaler INHALE 2 PUFFS INTO THE LUNGS EVERY 6 (SIX) HOURS AS NEEDED.   Alirocumab (PRALUENT) 150 MG/ML SOAJ 1 pen , Subcutaneous, Every 14 days   aspirin EC 81 mg, Oral, Daily   budesonide-formoterol (SYMBICORT) 80-4.5 MCG/ACT inhaler 2 puffs, Inhalation, 2 times daily   cetirizine (ZYRTEC) 10 mg, Daily PRN   dicyclomine (BENTYL) 10 mg, Oral, 2 times daily PRN   esomeprazole (NEXIUM) 40 mg, Oral, 2 times daily before meals   fish oil-omega-3 fatty acids 1 g, Oral, Daily   fluticasone (FLONASE) 50 MCG/ACT nasal spray 2 sprays, Each Nare, Daily   levothyroxine (SYNTHROID) 75 mcg, Oral, Daily   metoprolol succinate (TOPROL-XL) 25 mg, Oral, Daily  Multiple Vitamin (MULITIVITAMIN WITH MINERALS) TABS 1 tablet, Oral, Daily   nitroGLYCERIN (NITROSTAT) 0.4 mg, Sublingual, Every 5 min PRN   rosuvastatin (CRESTOR) 5 mg, Oral, Daily     VITALS:  There were no vitals filed for this visit.    PHYSICAL EXAM   HEENT:  Normocephalic, atraumatic.  The superficial temporal arteries are without ropiness or tenderness. Cardiovascular: Regular rate and rhythm. Lungs: Clear to auscultation  bilaterally. Neck: There are no carotid bruits noted bilaterally.  NEUROLOGICAL:     No data to display              No data to display           Orientation:  Alert and oriented to person, place and not to time***. No aphasia or dysarthria. Fund of knowledge is appropriate. Recent and remote memory impaired.  Attention and concentration are reduced***.  Able to name objects and repeat phrases /5 ***. Delayed recall  / *** Cranial nerves: There is good facial symmetry. Extraocular muscles are intact and visual fields are full to confrontational testing. Speech is fluent and clear. No tongue deviation. Hearing is intact to conversational tone.*** Tone: Tone is good throughout. Sensation: Sensation is intact to light touch.  Vibration is intact at the bilateral big toe.  Coordination: The patient has no difficulty with RAM's or FNF bilaterally. Normal finger to nose  Motor: Strength is 5/5 in the bilateral upper and lower extremities. There is no pronator drift. There are no fasciculations noted. DTR's: Deep tendon reflexes are 2/4 bilaterally. Gait and Station: The patient is able to ambulate without difficulty. Gait is cautious and narrow. Stride length is normal ***      Thank you for allowing [place] the opportunity to participate in the care of this nice patient. Please do not hesitate to contact us for any questions or concerns.   Total time spent on today's visit was *** minutes dedicated to this patient today, preparing to see patient, examining the patient, ordering tests and/or medications and counseling the patient, documenting clinical information in the EHR or other health record, independently interpreting results and communicating results to the patient/family, discussing treatment and goals, answering patient's questions and coordinating care.  Cc:  Georgianne Fick, MD  Marlowe Kays 03/19/2023 7:18 AM

## 2023-03-20 ENCOUNTER — Encounter: Payer: Self-pay | Admitting: Physician Assistant

## 2023-05-09 ENCOUNTER — Other Ambulatory Visit: Payer: Self-pay | Admitting: Internal Medicine

## 2023-05-09 DIAGNOSIS — Z1231 Encounter for screening mammogram for malignant neoplasm of breast: Secondary | ICD-10-CM

## 2023-06-02 ENCOUNTER — Ambulatory Visit: Payer: Medicare Other

## 2023-06-05 DIAGNOSIS — R413 Other amnesia: Secondary | ICD-10-CM | POA: Diagnosis not present

## 2023-06-05 DIAGNOSIS — E559 Vitamin D deficiency, unspecified: Secondary | ICD-10-CM | POA: Diagnosis not present

## 2023-06-05 DIAGNOSIS — I2510 Atherosclerotic heart disease of native coronary artery without angina pectoris: Secondary | ICD-10-CM | POA: Diagnosis not present

## 2023-06-05 DIAGNOSIS — G43009 Migraine without aura, not intractable, without status migrainosus: Secondary | ICD-10-CM | POA: Diagnosis not present

## 2023-06-05 DIAGNOSIS — E1122 Type 2 diabetes mellitus with diabetic chronic kidney disease: Secondary | ICD-10-CM | POA: Diagnosis not present

## 2023-06-05 DIAGNOSIS — E039 Hypothyroidism, unspecified: Secondary | ICD-10-CM | POA: Diagnosis not present

## 2023-08-21 DIAGNOSIS — E039 Hypothyroidism, unspecified: Secondary | ICD-10-CM | POA: Diagnosis not present

## 2023-08-21 DIAGNOSIS — Z79899 Other long term (current) drug therapy: Secondary | ICD-10-CM | POA: Diagnosis not present

## 2023-08-21 DIAGNOSIS — H6123 Impacted cerumen, bilateral: Secondary | ICD-10-CM | POA: Diagnosis not present

## 2023-08-21 DIAGNOSIS — D539 Nutritional anemia, unspecified: Secondary | ICD-10-CM | POA: Diagnosis not present

## 2023-09-17 ENCOUNTER — Ambulatory Visit

## 2023-09-17 DIAGNOSIS — M25512 Pain in left shoulder: Secondary | ICD-10-CM | POA: Diagnosis not present

## 2023-10-02 ENCOUNTER — Ambulatory Visit

## 2023-10-23 ENCOUNTER — Ambulatory Visit

## 2023-10-29 DIAGNOSIS — M7542 Impingement syndrome of left shoulder: Secondary | ICD-10-CM | POA: Diagnosis not present

## 2023-10-31 ENCOUNTER — Ambulatory Visit
Admission: RE | Admit: 2023-10-31 | Discharge: 2023-10-31 | Disposition: A | Discharge status: Home / Self Care | Source: Ambulatory Visit | Attending: Internal Medicine | Admitting: Internal Medicine

## 2023-10-31 DIAGNOSIS — Z1231 Encounter for screening mammogram for malignant neoplasm of breast: Secondary | ICD-10-CM | POA: Diagnosis not present

## 2023-12-09 DIAGNOSIS — Z13220 Encounter for screening for lipoid disorders: Secondary | ICD-10-CM | POA: Diagnosis not present

## 2023-12-09 DIAGNOSIS — R0789 Other chest pain: Secondary | ICD-10-CM | POA: Diagnosis not present

## 2023-12-09 DIAGNOSIS — K635 Polyp of colon: Secondary | ICD-10-CM | POA: Diagnosis not present

## 2023-12-09 DIAGNOSIS — E039 Hypothyroidism, unspecified: Secondary | ICD-10-CM | POA: Diagnosis not present

## 2023-12-09 DIAGNOSIS — Z1321 Encounter for screening for nutritional disorder: Secondary | ICD-10-CM | POA: Diagnosis not present

## 2023-12-09 DIAGNOSIS — G479 Sleep disorder, unspecified: Secondary | ICD-10-CM | POA: Diagnosis not present

## 2024-01-05 DIAGNOSIS — R946 Abnormal results of thyroid function studies: Secondary | ICD-10-CM | POA: Diagnosis not present

## 2024-01-05 DIAGNOSIS — E039 Hypothyroidism, unspecified: Secondary | ICD-10-CM | POA: Diagnosis not present

## 2024-01-05 DIAGNOSIS — R7989 Other specified abnormal findings of blood chemistry: Secondary | ICD-10-CM | POA: Diagnosis not present

## 2024-01-06 ENCOUNTER — Encounter: Payer: Self-pay | Admitting: Physician Assistant

## 2024-02-10 DIAGNOSIS — N1831 Chronic kidney disease, stage 3a: Secondary | ICD-10-CM | POA: Diagnosis not present

## 2024-02-10 DIAGNOSIS — I10 Essential (primary) hypertension: Secondary | ICD-10-CM | POA: Diagnosis not present

## 2024-02-10 DIAGNOSIS — E039 Hypothyroidism, unspecified: Secondary | ICD-10-CM | POA: Diagnosis not present

## 2024-02-10 DIAGNOSIS — I129 Hypertensive chronic kidney disease with stage 1 through stage 4 chronic kidney disease, or unspecified chronic kidney disease: Secondary | ICD-10-CM | POA: Diagnosis not present

## 2024-02-10 NOTE — Progress Notes (Signed)
 Name: Dominique Mooney Date of visit: 02/10/24  Chief Complaint   Chief Complaint  Patient presents with  . Follow-up  . Nail Problem    Left big toe x did nail care lastnight and toenail is now black     Subjective  Dominique Mooney is a 73 y.o. female who presents today for follow up on chronic conditions. Patient is familiar to me, last seen on 12/09/2023.   History of Present Illness The patient presents for evaluation of hypothyroidism, anxiety, and black discoloration of the toenail.  She has been adhering to her thyroid  medication regimen, taking it in the morning. She reports no feelings of fatigue. She has been using a pill box to organize her medications. She has consulted with an endocrinologist who advised her to continue her medication and follow up with lab work.  She is not currently taking Aricept (donepezil). Her daughter, an LPN, has observed that she becomes slightly anxious in the evenings, which may be why she is not taking the Aricept. She has an upcoming appointment with a neurologist on 04/08/2024. She occasionally experiences anxiety, which she manages by taking deep breaths. She was previously on clonazepam .  I did not recommend restarting this medication but offered other options.  Her daughter would prefer a patch but I do not know of any kind of patch that would be helpful for this issue.  I recommended mirtazapine due to its safety but it is a pill.  Her daughter would like to try it.  She noticed a change in her toenail color after removing the polish during nail care last night. She does not recall any recent injury to her toe.  It does not hurt or bother her.    Problem List[1]    Patient denies new N/V, chest pain, abdominal pain or issues with bowel/bladder.    Current Outpatient Medications  Medication Instructions  . albuterol  HFA (PROVENTIL  HFA;VENTOLIN  HFA;PROAIR  HFA) 90 mcg/actuation inhaler 1 puff, inhalation, Every 6 hours PRN   . donepeziL (ARICEPT) 5 mg, oral, Daily  . levothyroxine  (SYNTHROID ) 75 mcg, oral, Every morning  . melatonin 10 mg, oral, At bedtime   Allergies[2]  ROS  Other systems reviewed and negative. No other complaints.  Objective   BP 112/70   Pulse 83   Temp 96.5 F (35.8 C) (Temporal)   Wt 52.5 kg (115 lb 11.2 oz)   SpO2 100%   BMI 21.16 kg/m    Physical Exam: Gen: appears comfortable. Cardio: Normal rate, regular rhythm. Normal S1/S2. No appreciable murmurs. Resp: Lungs clear to auscultation bilaterally.  Skin: Left great toenail is thickened and dark, not completely black and most likely consistent with onychomycosis.  2+ pedal pulses intact without edema of the feet. Neurologic:  No focal deficits. Psych: Answers questions appropriately, cooperative. Appropriate affect.   Assessment & Plan   Orders Placed This Encounter  Procedures  . Basic Metabolic Panel     Orders Placed This Encounter  Medications  . DISCONTD: mirtazapine (REMERON) 7.5 mg tablet    Sig: Take 1 tablet (7.5 mg total) by mouth at bedtime.    Dispense:  90 tablet    Refill:  3  . mirtazapine (REMERON) 7.5 mg tablet    Sig: Take 1 tablet (7.5 mg total) by mouth at bedtime.    Dispense:  30 tablet    Refill:  1      Assessment & Plan 1. Hypothyroidism: - She is advised to use a pill  box to help manage her medication intake. - We will get the lab orders today although it is a week ahead of when the endocrinologist recommended but I wanted to recheck her BMP.  2.  Essential hypertension - Blood pressure came down on recheck.  Currently she is not on medication and does not need it.  3. Black discoloration of the toenail: - The black discoloration of her toenail is likely due to a fungal infection. - It is not detrimental and does not require treatment at this time. The discoloration may persist without treatment, but it is not harmful. She is advised to monitor the toenail for any  changes.  4.  Anxiety in setting of likely dementia - A prescription for mirtazapine to try for the anxiety and rest.  I wrote down the information for the neurology appointment with Dominique Mooney     Follow up 3 mo, sooner if needed. patient verbalized agreement to above plan. 30 minutes was spent reviewing and interpreting prior notes/images/labs, counseling the patient, transmitting prescriptions and arranging follow up.    This note was dictated with voice recognition software. Similar sounding words may be inadvertently transcribed incorrectly.   Dominique Cottie Murray, PA-C       [1] Patient Active Problem List Diagnosis  . Abdominal cramping  . Abdominal gas pain  . Abnormal mammogram  . Adverse effect of statin  . Allergic rhinitis  . Arthralgia of left knee  . Benign essential hypertension  . Cervical spondylosis without myelopathy  . DDD (degenerative disc disease), cervical  . Mastodynia  . Chronic kidney disease, stage 2 (mild)  . Constipation  . Coronary artery calcification seen on CT scan  . Cough  . Esophageal reflux  . Hyperbilirubinemia  . Mixed hyperlipidemia  . History of total left knee replacement  . Acquired hypothyroidism  . Impingement syndrome of left shoulder region  . Irritable bowel syndrome  . Mild persistent asthma (CMD)  . Neuritis of left saphenous nerve  . Osteoarthritis of left knee  . Osteoarthritis of right knee  . Pain in joint of left shoulder  . Rotator cuff impingement syndrome  . Subacromial bursitis of left shoulder joint  . Tendinitis of left rotator cuff  . Toxin-induced liver disease  . Anxiety  . Sleep disturbance  . Hyperplastic colonic polyp  . Chest tightness  [2] Allergies Allergen Reactions  . Statins-Hmg-Coa Reductase Inhibitors Other (See Comments)    Elevated Liver functions  . Penicillins Itching and Rash  . Atorvastatin  Other (See Comments)    Abdominal bloating  atorvastatin  calcium   . Penicillin  Rash

## 2024-02-15 NOTE — Progress Notes (Incomplete)
 Assessment/Plan:   Dominique Mooney is a very pleasant 73 y.o. year old LH female with a history of hypertension, hyperlipidemia, hypothyroidism, DM2, asthma GERD, IBS, anxiety, CAD, arthritis, autoimmune hepatitis CKD, seen today for evaluation of memory loss. MoCA today is 15/30.  She reports that she took donepezil in the past, but then stopped.  She was with her daughter and then requested that she leave the room, denying any memory issues, stating that I think they want to take my house and everything, they want to get rid of me, put me on assisted living .  Based on today's performance, confabulation behavior, there is suspicion for a neurodegenerative disease.  She is able to participate in her ADLs and continues to drive, and continues to be eloquent.  Her long-term memory is fairly good.      Memory Impairment of unclear etiology   MRI brain without contrast to assess for underlying structural abnormality and assess vascular load Check B12 Continue to monitor your thyroid  values with specialty Start donepezil 5 mg daily.  If tolerated, will increase to 10 mg daily.  Side effects discussed. Continue to control mood as per PCP Recommend good control of cardiovascular risk factors Recommend assisted living for cognitive and social stimulation as well as for safety [place] up in 3 to 4 months   Subjective:   The patient was accompanied by her LPN daughter, but requested that her daughter to leave the room .   How long did patient have memory difficulties? Patient denies and becomes quite anxious saying I just learned this morning that I have an appointment and I am really angry at my daughter .  She denies any short-term memory issues, there is nothing wrong with me .  Long-term memory is good. Likes to read, crosswords, going to [employer]. repeats oneself?  Endorsed, witnessed during this visit Disoriented when walking into a room?  Patient denies  Leaving objects in unusual  places? Denies.   Wandering behavior?  denies .  Any personality changes?  Denies.   Any history of depression?:  Denies. She has anxiety, worse for the last 2 months, she feels that the family wants to take over her home. She describes herself as a perfectionist and likes to do things on her own, it causes her anxiety and depression being assisted all the time, watch by my children.  I feel that they want to get rid of me .     Hallucinations or paranoia?  Denies hallucinations, she is somewhat paranoid about the feeling that her children want to get rid of her .  My daughter thinks I am old and I feel disrespected .  In addition, she states that my father was killed when I was 8, I witnessed it, he was lying in the floor. So I lock the doors for that reason, to make sure no one gets him  Seizures?  Denies    Any sleep changes? Does not sleep well, recently started on mirtazapine. Denies vivid dreams, REM behavior or sleepwalking   Sleep apnea?  Denies   Any hygiene concerns?  Denies   Independent of bathing and dressing?  Endorsed  Does the patient needs help with medications? Patient is in charge   Who is in charge of the finances? Patient is in charge     Any changes in appetite?  Denies. Tries to eat healthy     Patient have trouble swallowing? Denies.   Does the patient cook? No  Any kitchen accidents such as leaving the stove on? Denies.   Any history of headaches?   Denies.   Chronic pain ? Denies.   Ambulates with difficulty?  Denies.  I walk a lot  Recent falls or head injuries? Denies.   Vision changes? Denies.   Any stroke like symptoms? Denies.   Any tremors?   Denies.   Any anosmia?  Denies.   Any incontinence of urine? Denies.   Any bowel dysfunction? Denies.      Patient lives alone during the day but her daughter or son stay with her at night, as she has fear of being alone  History of heavy alcohol intake? Denies.   History of heavy tobacco use? Denies.    Family history of dementia? One sister much older, may have some memory issues. Does patient drive? Yes, denies getting lost   Pertinent available labs: 12/2023: TSH at 41, free T4 0.6, follows endocrine        Past Medical History:  Diagnosis Date   Allergic rhinitis        Arthritis    Asthma        Chest pressure    a. Normal Stress Echo 05/2010   Complication of anesthesia    GERD (gastroesophageal reflux disease)    Hyperlipidemia    Hypertension    Hypothyroidism    Irritable bowel syndrome    Pancreatitis    after ERCP   Pneumonia    PONV (postoperative nausea and vomiting)      Past Surgical History:  Procedure Laterality Date   BRAVO [facility] STUDY  05/02/2011   Procedure: BRAVO PH STUDY;  Surgeon: Toribio Cedar, MD;  Location: [facility];  Service: Endoscopy;  Laterality: N/A;  48 hour wirless pH test (Bravo Test)   CHOLECYSTECTOMY     ESOPHAGOGASTRODUODENOSCOPY  05/02/2011   Procedure: ESOPHAGOGASTRODUODENOSCOPY (EGD);  Surgeon: Toribio Cedar, MD;  Location: [facility];  Service: Endoscopy;  Laterality: N/A;   HERNIA REPAIR     LEFT HEART CATH AND CORONARY ANGIOGRAPHY N/A 06/26/2017   Procedure: LEFT HEART CATH AND CORONARY ANGIOGRAPHY;  Surgeon: Claudene Victory ORN, MD;  Location: [facility]AS[facility];  Service: Cardiovascular;  Laterality: N/A;   MENISCUS REPAIR Left 03/18/2016   and repair of cracked bone   SALPINGOOPHORECTOMY     TOTAL ABDOMINAL HYSTERECTOMY     TOTAL KNEE ARTHROPLASTY Left 07/16/2019   Procedure: TOTAL KNEE ARTHROPLASTY;  Surgeon: Gerome Charleston, MD;  Location: [facility];  Service: Orthopedics;  Laterality: Left;     Allergies  Allergen Reactions   Statins Other (See Comments)    Elevated Liver functions   Penicillins Rash   Lipitor [Atorvastatin ] Other (See Comments)    Abdominal bloating    Current Outpatient Medications  Medication Instructions   albuterol  (PROVENTIL  HFA) 108 (90 Base) MCG/ACT inhaler INHALE 2 PUFFS INTO THE LUNGS EVERY  6 (SIX) HOURS AS NEEDED.   Alirocumab  (PRALUENT ) 150 MG/ML SOAJ 1 pen , Subcutaneous, Every 14 days   aspirin  EC 81 mg, Daily   budesonide -formoterol  (SYMBICORT ) 80-4.5 MCG/ACT inhaler 2 puffs, Inhalation, 2 times daily   cetirizine (ZYRTEC) 10 mg, Daily PRN   dicyclomine  (BENTYL ) 10 mg, Oral, 2 times daily PRN   donepezil (ARICEPT) 5 MG tablet Take one tablet 5mg  daily.   esomeprazole  (NEXIUM ) 40 mg, Oral, 2 times daily before meals   fish oil-omega-3 fatty acids 1 g, Daily   fluticasone  (FLONASE ) 50 MCG/ACT nasal spray 2 sprays, Each Nare, Daily  levothyroxine  (SYNTHROID ) 75 mcg, Daily   metoprolol  succinate (TOPROL -XL) 25 mg, Oral, Daily   Multiple Vitamin (MULITIVITAMIN WITH MINERALS) TABS 1 tablet, Daily   nitroGLYCERIN  (NITROSTAT ) 0.4 mg, Sublingual, Every 5 min PRN   rosuvastatin  (CRESTOR ) 5 mg, Oral, Daily     VITALS:   Vitals:   02/16/24 0750 02/16/24 0829  BP: (!) 144/82 130/82  Pulse: 84   Resp: 20   SpO2: 98%   Weight: 116 lb (52.6 kg)   Height: 5' 1 (1.549 m)          02/16/2024    8:00 AM  Montreal Cognitive Assessment   Visuospatial/ Executive (0/5) 1  Naming (0/3) 2  Attention: Read list of digits (0/2) 2  Attention: Read list of letters (0/1) 1  Attention: Serial 7 subtraction starting at 100 (0/3) 0  Language: Repeat phrase (0/2) 2  Language : Fluency (0/1) 0  Abstraction (0/2) 1  Delayed Recall (0/5) 0  Orientation (0/6) 5  Total 14  Adjusted Score (based on education) 15        No data to display           PHYSICAL EXAM   HEENT:  Normocephalic, atraumatic. The superficial temporal arteries are without ropiness or tenderness. Cardiovascular: Regular rate and rhythm. Lungs: Clear to auscultation bilaterally. Neck: There are no carotid bruits noted bilaterally.  Orientation:  Alert and oriented to person, time, not to place. No aphasia or dysarthria. Fund of knowledge is reduced.  Recent and remote memory impaired.  Attention and  concentration are decreased.  Able to name objects 1/3 and able to repeat phrases. Delayed recall 0/5 Cranial nerves: There is good facial symmetry. Extraocular muscles are intact and visual fields are full to confrontational testing. Speech is fluent and clear. No tongue deviation. Hearing is intact to conversational tone. Tone: Tone is good throughout. Abnormal movements: No tremors. No Asterixis. No Fasciculations Sensation: Sensation is intact to light touch. Vibration is intact at the bilateral big toe.  Coordination: The patient has no difficulty with RAM's or FNF bilaterally. Normal finger to nose  Motor: Strength is 5/5 in the bilateral upper and lower extremities. There is no pronator drift. There are no fasciculations noted. DTR's: Deep tendon reflexes are 2/4 bilaterally. Gait and Station: The patient is able to ambulate without difficulty The patient is able to heel toe walk. Gait is cautious and narrow. The patient is able to ambulate in a tandem fashion.       Thank you for allowing us  the opportunity to participate in the care of this nice patient. Please do not hesitate to contact us  for any questions or concerns.   Total time spent on today's visit was 50 minutes dedicated to this patient today, preparing to see patient, examining the patient, ordering tests and/or medications and counseling the patient, documenting clinical information in the EHR or other health record, independently interpreting results and communicating results to the patient/family, discussing treatment and goals, answering patient's questions and coordinating care.  Cc:  Verdia Lombard, MD  Camie Sevin 02/16/2024 9:04 AM

## 2024-02-16 ENCOUNTER — Ambulatory Visit: Admitting: Physician Assistant

## 2024-02-16 ENCOUNTER — Ambulatory Visit

## 2024-02-16 ENCOUNTER — Encounter: Payer: Self-pay | Admitting: Physician Assistant

## 2024-02-16 ENCOUNTER — Other Ambulatory Visit

## 2024-02-16 VITALS — BP 130/82 | HR 84 | Resp 20 | Ht 61.0 in | Wt 116.0 lb

## 2024-02-16 DIAGNOSIS — R413 Other amnesia: Secondary | ICD-10-CM

## 2024-02-16 MED ORDER — DONEPEZIL HCL 5 MG PO TABS
ORAL_TABLET | ORAL | 2 refills | Status: AC
Start: 2024-02-16 — End: ?

## 2024-02-16 NOTE — Patient Instructions (Addendum)
 It was a pleasure to see you today at our office.   Recommendations:  MRI of the brain, the radiology office will call you to arrange you appointment [phone] Start Donepezil 5mg  daily. Side effects discussed   Check labs (B12)  [address]. Follow up in 3-4  months  [facility]    [email or web address]   RECOMMENDATIONS FOR ALL PATIENTS WITH MEMORY PROBLEMS: 1. Continue to exercise (Recommend 30 minutes of walking everyday, or 3 hours every week) 2. Increase social interactions - continue going to [place] and enjoy social gatherings with friends and family 3. Eat healthy, avoid fried foods and eat more fruits and vegetables 4. Maintain adequate blood pressure, blood sugar, and blood cholesterol level. Reducing the risk of stroke and cardiovascular disease also helps promoting better memory. 5. Avoid stressful situations. Live a simple life and avoid aggravations. Organize your time and prepare for the next day in anticipation. 6. Sleep well, avoid any interruptions of sleep and avoid any distractions in the bedroom that may interfere with adequate sleep quality 7. Avoid sugar, avoid sweets as there is a strong link between excessive sugar intake, diabetes, and cognitive impairment We discussed the Mediterranean diet, which has been shown to help patients reduce the risk of progressive memory disorders and reduces cardiovascular risk. This includes eating fish, eat fruits and green leafy vegetables, nuts like almonds and hazelnuts, walnuts, and also use olive oil. Avoid fast foods and fried foods as much as possible. Avoid sweets and sugar as sugar use has been linked to worsening of memory function.  There is always a concern of gradual progression of memory problems. If this is the case, then we may need to adjust level of care according to patient needs. Support, both to the patient and caregiver, should then be put into  place.      You have been referred for a neuropsychological evaluation (i.e., evaluation of memory and thinking abilities). Please bring someone with you to this appointment if possible, as it is helpful for the doctor to hear from both you and another adult who knows you well. Please bring eyeglasses and hearing aids if you wear them.    The evaluation will take approximately 3 hours and has two parts:   The first part is a clinical interview with the neuropsychologist (Dr. Richie or Dr. Gayland). During the interview, the neuropsychologist will speak with you and the individual you brought to the appointment.    The second part of the evaluation is testing with the doctor's technician Neal or Luke). During the testing, the technician will ask you to remember different types of material, solve problems, and answer some questionnaires. Your family member will not be present for this portion of the evaluation.   Please note: We must reserve several hours of the neuropsychologist's time and the psychometrician's time for your evaluation appointment. As such, there is a No-Show fee of $100. If you are unable to attend any of your appointments, please contact our office as soon as possible to reschedule.      DRIVING: Regarding driving, in patients with progressive memory problems, driving will be impaired. We advise to have someone else do the driving if trouble finding directions or if minor accidents are reported. Independent driving assessment is available to determine safety of driving.   If you are interested in the driving assessment, you can contact the following:  [facility] in [place] [phone]  [facility] [phone]  [facility] [phone]  [facility] [phone] or [phone]   FALL PRECAUTIONS: Be cautious when walking. Scan the area for obstacles that may increase the risk of trips and falls. When getting up in the  mornings, sit up at the edge of the bed for a few minutes before getting out of bed. Consider elevating the bed at the head end to avoid drop of blood pressure when getting up. Walk always in a well-lit room (use night lights in the walls). Avoid area rugs or power cords from appliances in the middle of the walkways. Use a walker or a cane if necessary and consider physical therapy for balance exercise. Get your eyesight checked regularly.  FINANCIAL OVERSIGHT: Supervision, especially oversight when making financial decisions or transactions is also recommended.  HOME SAFETY: Consider the safety of the kitchen when operating appliances like stoves, microwave oven, and blender. Consider having supervision and share cooking responsibilities until no longer able to participate in those. Accidents with firearms and other hazards in the house should be identified and addressed as well.   ABILITY TO BE LEFT ALONE: If patient is unable to contact 911 operator, consider using LifeLine, or when the need is there, arrange for someone to stay with patients. Smoking is a fire hazard, consider supervision or cessation. Risk of wandering should be assessed by caregiver and if detected at any point, supervision and safe proof recommendations should be instituted.  MEDICATION SUPERVISION: Inability to self-administer medication needs to be constantly addressed. Implement a mechanism to ensure safe administration of the medications.      Mediterranean Diet A Mediterranean diet refers to food and lifestyle choices that are based on the traditions of countries located on the [place]. This way of eating has been shown to help prevent certain conditions and improve outcomes for people who have chronic diseases, like kidney disease and heart disease. What are tips for following this plan? Lifestyle  Cook and eat meals together with your family, when possible. Drink enough fluid to keep your urine clear or  pale yellow. Be physically active every day. This includes: Aerobic exercise like running or swimming. Leisure activities like gardening, walking, or housework. Get 7-8 hours of sleep each night. If recommended by your health care provider, drink red wine in moderation. This means 1 glass a day for nonpregnant women and 2 glasses a day for men. A glass of wine equals 5 oz (150 mL). Reading food labels  Check the serving size of packaged foods. For foods such as rice and pasta, the serving size refers to the amount of cooked product, not dry. Check the total fat in packaged foods. Avoid foods that have saturated fat or trans fats. Check the ingredients list for added sugars, such as corn syrup. Shopping  At the grocery store, buy most of your food from the areas near the walls of the store. This includes: Fresh fruits and vegetables (produce). Grains, beans, nuts, and seeds. Some of these may be available in unpackaged forms or large amounts (in bulk). Fresh seafood. Poultry and eggs. Low-fat dairy products. Buy whole ingredients instead of prepackaged foods. Buy fresh fruits and vegetables in-season from local farmers markets. Buy frozen fruits and vegetables in resealable bags. If you do not have access to quality fresh seafood, buy precooked frozen shrimp or canned fish, such as tuna, salmon, or sardines. Buy small amounts of raw or cooked vegetables, salads, or olives from the deli or salad bar at your store. Stock your pantry so you always have certain  foods on hand, such as olive oil, canned tuna, canned tomatoes, rice, pasta, and beans. Cooking  Cook foods with extra-virgin olive oil instead of using butter or other vegetable oils. Have meat as a side dish, and have vegetables or grains as your main dish. This means having meat in small portions or adding small amounts of meat to foods like pasta or stew. Use beans or vegetables instead of meat in common dishes like chili or  lasagna. Experiment with different cooking methods. Try roasting or broiling vegetables instead of steaming or sauteing them. Add frozen vegetables to soups, stews, pasta, or rice. Add nuts or seeds for added healthy fat at each meal. You can add these to yogurt, salads, or vegetable dishes. Marinate fish or vegetables using olive oil, lemon juice, garlic, and fresh herbs. Meal planning  Plan to eat 1 vegetarian meal one day each week. Try to work up to 2 vegetarian meals, if possible. Eat seafood 2 or more times a week. Have healthy snacks readily available, such as: Vegetable sticks with hummus. Greek yogurt. Fruit and nut trail mix. Eat balanced meals throughout the week. This includes: Fruit: 2-3 servings a day Vegetables: 4-5 servings a day Low-fat dairy: 2 servings a day Fish, poultry, or lean meat: 1 serving a day Beans and legumes: 2 or more servings a week Nuts and seeds: 1-2 servings a day Whole grains: 6-8 servings a day Extra-virgin olive oil: 3-4 servings a day Limit red meat and sweets to only a few servings a month What are my food choices? Mediterranean diet Recommended Grains: Whole-grain pasta. Brown rice. Bulgar wheat. Polenta. Couscous. Whole-wheat bread. [place] Made[place]. Vegetables: Artichokes. Beets. Broccoli. Cabbage. Carrots. Eggplant. Green beans. Chard. Kale. Spinach. Onions. Leeks. Peas. Squash. Tomatoes. Peppers. Radishes. Fruits: Apples. Apricots. Avocado. Berries. Bananas. Cherries. Dates. Figs. Grapes. Lemons. Melon. Oranges. Peaches. Plums. Pomegranate. Meats and other protein foods: Beans. Almonds. Sunflower seeds. Pine nuts. Peanuts. Cod. Salmon. Scallops. Shrimp. Tuna. Tilapia. Clams. Oysters. Eggs. Dairy: Low-fat milk. Cheese. Greek yogurt. Beverages: Water . Red wine. Herbal tea. Fats and oils: Extra virgin olive oil. Avocado oil. Grape seed oil. Sweets and desserts: [place] yogurt with honey. Baked apples. Poached pears. Trail mix. Seasoning and  other foods: Basil. Cilantro. Coriander. Cumin. Mint. Parsley. Sage. Rosemary. Tarragon. Garlic. Oregano. Thyme. Pepper. Balsalmic vinegar. Tahini. Hummus. Tomato sauce. Olives. Mushrooms. Limit these Grains: Prepackaged pasta or rice dishes. Prepackaged cereal with added sugar. Vegetables: Deep fried potatoes (french fries). Fruits: Fruit canned in syrup. Meats and other protein foods: Beef. Pork. Lamb. Poultry with skin. Hot dogs. [place]. Dairy: Ice cream. Sour cream. Whole milk. Beverages: Juice. Sugar-sweetened soft drinks. Beer. Liquor and spirits. Fats and oils: Butter. Canola oil. Vegetable oil. Beef fat (tallow). Lard. Sweets and desserts: Cookies. Cakes. Pies. Candy. Seasoning and other foods: Mayonnaise. Premade sauces and marinades. The items listed may not be a complete list. Talk with your dietitian about what dietary choices are right for you. Summary The Mediterranean diet includes both food and lifestyle choices. Eat a variety of fresh fruits and vegetables, beans, nuts, seeds, and whole grains. Limit the amount of red meat and sweets that you eat. Talk with your health care provider about whether it is safe for you to drink red wine in moderation. This means 1 glass a day for nonpregnant women and 2 glasses a day for men. A glass of wine equals 5 oz (150 mL). This information is not intended to replace advice given to you by your health care provider.  Make sure you discuss any questions you have with your health care provider. Document Released: 12/28/2015 Document Revised: 01/30/2016 Document Reviewed: 12/28/2015 Elsevier Interactive Patient Education  2017 [employer].

## 2024-02-17 ENCOUNTER — Ambulatory Visit: Payer: Self-pay | Admitting: Physician Assistant

## 2024-02-17 LAB — VITAMIN B12: Vitamin B-12: 504 pg/mL (ref 200–1100)

## 2024-02-23 ENCOUNTER — Telehealth: Payer: Self-pay | Admitting: Physician Assistant

## 2024-02-23 NOTE — Telephone Encounter (Signed)
 Pt's daughter called this morning and stated that her mother(pt) refuse to go to [facility] yesterday to get the MRI . The Daughter wants to know what to do about this since mother has Dementia. Thanks

## 2024-02-23 NOTE — Telephone Encounter (Signed)
 Patient refused to go appt and MRI.Patient is in totally denial. The Daughter whom is on DRP cassius is asking for help, refuseing to take meds. Any advise.

## 2024-02-23 NOTE — Telephone Encounter (Signed)
 I left message on machine to call office to discuss MRI.

## 2024-02-23 NOTE — Telephone Encounter (Signed)
 Pt.s daughter called bk and FYI call xfrd

## 2024-02-29 ENCOUNTER — Other Ambulatory Visit

## 2024-03-01 ENCOUNTER — Telehealth: Payer: Self-pay | Admitting: Physician Assistant

## 2024-03-01 NOTE — Telephone Encounter (Signed)
 Pt' daughter called in and she stated that her mother refuse the MRI that was scheduled on 02-29-24. Pt is not taking her medications and she  stated that she feels nothing  is wrong with her. Thanks

## 2024-03-17 ENCOUNTER — Encounter: Payer: Self-pay | Admitting: Physician Assistant

## 2024-03-17 NOTE — Telephone Encounter (Signed)
 Good afternoon, can you provide us  what insurances you have first. That away, so we can best accommodate you.

## 2024-03-18 NOTE — Telephone Encounter (Signed)
 I advised to POA, [facility] to [facility] and start there to help with assistance and guidance, She thanked me for calling and will update the office, if she needs anything.

## 2024-03-27 ENCOUNTER — Inpatient Hospital Stay (HOSPITAL_COMMUNITY)
Admission: EM | Admit: 2024-03-27 | Discharge: 2024-04-20 | DRG: 329 | Disposition: A | Discharge status: Home / Self Care | Attending: Internal Medicine | Admitting: Internal Medicine

## 2024-03-27 ENCOUNTER — Encounter (HOSPITAL_COMMUNITY): Payer: Self-pay | Admitting: *Deleted

## 2024-03-27 ENCOUNTER — Emergency Department (HOSPITAL_COMMUNITY)

## 2024-03-27 ENCOUNTER — Other Ambulatory Visit: Payer: Self-pay

## 2024-03-27 DIAGNOSIS — E039 Hypothyroidism, unspecified: Secondary | ICD-10-CM | POA: Diagnosis present

## 2024-03-27 DIAGNOSIS — Z79899 Other long term (current) drug therapy: Secondary | ICD-10-CM

## 2024-03-27 DIAGNOSIS — Z880 Allergy status to penicillin: Secondary | ICD-10-CM

## 2024-03-27 DIAGNOSIS — F0390 Unspecified dementia without behavioral disturbance: Secondary | ICD-10-CM | POA: Diagnosis present

## 2024-03-27 DIAGNOSIS — I2510 Atherosclerotic heart disease of native coronary artery without angina pectoris: Secondary | ICD-10-CM | POA: Diagnosis present

## 2024-03-27 DIAGNOSIS — K567 Ileus, unspecified: Secondary | ICD-10-CM | POA: Diagnosis not present

## 2024-03-27 DIAGNOSIS — Z8701 Personal history of pneumonia (recurrent): Secondary | ICD-10-CM

## 2024-03-27 DIAGNOSIS — K5652 Intestinal adhesions [bands] with complete obstruction: Principal | ICD-10-CM | POA: Diagnosis present

## 2024-03-27 DIAGNOSIS — Z8249 Family history of ischemic heart disease and other diseases of the circulatory system: Secondary | ICD-10-CM

## 2024-03-27 DIAGNOSIS — T381X6A Underdosing of thyroid hormones and substitutes, initial encounter: Secondary | ICD-10-CM | POA: Diagnosis present

## 2024-03-27 DIAGNOSIS — J4530 Mild persistent asthma, uncomplicated: Secondary | ICD-10-CM | POA: Diagnosis present

## 2024-03-27 DIAGNOSIS — Z7951 Long term (current) use of inhaled steroids: Secondary | ICD-10-CM

## 2024-03-27 DIAGNOSIS — Z823 Family history of stroke: Secondary | ICD-10-CM

## 2024-03-27 DIAGNOSIS — K56609 Unspecified intestinal obstruction, unspecified as to partial versus complete obstruction: Principal | ICD-10-CM | POA: Diagnosis present

## 2024-03-27 DIAGNOSIS — E871 Hypo-osmolality and hyponatremia: Secondary | ICD-10-CM | POA: Diagnosis not present

## 2024-03-27 DIAGNOSIS — N1831 Chronic kidney disease, stage 3a: Secondary | ICD-10-CM | POA: Diagnosis present

## 2024-03-27 DIAGNOSIS — Z888 Allergy status to other drugs, medicaments and biological substances status: Secondary | ICD-10-CM

## 2024-03-27 DIAGNOSIS — N736 Female pelvic peritoneal adhesions (postinfective): Secondary | ICD-10-CM | POA: Diagnosis present

## 2024-03-27 DIAGNOSIS — E876 Hypokalemia: Secondary | ICD-10-CM | POA: Diagnosis not present

## 2024-03-27 DIAGNOSIS — Z91148 Patient's other noncompliance with medication regimen for other reason: Secondary | ICD-10-CM

## 2024-03-27 DIAGNOSIS — E785 Hyperlipidemia, unspecified: Secondary | ICD-10-CM | POA: Diagnosis present

## 2024-03-27 DIAGNOSIS — E43 Unspecified severe protein-calorie malnutrition: Secondary | ICD-10-CM | POA: Diagnosis present

## 2024-03-27 DIAGNOSIS — Z6822 Body mass index (BMI) 22.0-22.9, adult: Secondary | ICD-10-CM

## 2024-03-27 DIAGNOSIS — D649 Anemia, unspecified: Secondary | ICD-10-CM | POA: Diagnosis present

## 2024-03-27 DIAGNOSIS — Z96652 Presence of left artificial knee joint: Secondary | ICD-10-CM | POA: Diagnosis present

## 2024-03-27 DIAGNOSIS — I129 Hypertensive chronic kidney disease with stage 1 through stage 4 chronic kidney disease, or unspecified chronic kidney disease: Secondary | ICD-10-CM | POA: Diagnosis present

## 2024-03-27 DIAGNOSIS — K719 Toxic liver disease, unspecified: Secondary | ICD-10-CM | POA: Diagnosis present

## 2024-03-27 DIAGNOSIS — Z79890 Hormone replacement therapy (postmenopausal): Secondary | ICD-10-CM

## 2024-03-27 DIAGNOSIS — Z7982 Long term (current) use of aspirin: Secondary | ICD-10-CM

## 2024-03-27 DIAGNOSIS — Z602 Problems related to living alone: Secondary | ICD-10-CM | POA: Diagnosis present

## 2024-03-27 DIAGNOSIS — Z90710 Acquired absence of both cervix and uterus: Secondary | ICD-10-CM

## 2024-03-27 DIAGNOSIS — R070 Pain in throat: Secondary | ICD-10-CM | POA: Diagnosis not present

## 2024-03-27 DIAGNOSIS — K219 Gastro-esophageal reflux disease without esophagitis: Secondary | ICD-10-CM | POA: Diagnosis present

## 2024-03-27 DIAGNOSIS — Z9049 Acquired absence of other specified parts of digestive tract: Secondary | ICD-10-CM

## 2024-03-27 DIAGNOSIS — D72829 Elevated white blood cell count, unspecified: Secondary | ICD-10-CM | POA: Diagnosis present

## 2024-03-27 DIAGNOSIS — Z83719 Family history of colon polyps, unspecified: Secondary | ICD-10-CM

## 2024-03-27 LAB — URINALYSIS, ROUTINE W REFLEX MICROSCOPIC
Bilirubin Urine: NEGATIVE
Glucose, UA: NEGATIVE mg/dL
Hgb urine dipstick: NEGATIVE
Ketones, ur: NEGATIVE mg/dL
Leukocytes,Ua: NEGATIVE
Nitrite: NEGATIVE
Protein, ur: NEGATIVE mg/dL
Specific Gravity, Urine: 1.013 (ref 1.005–1.030)
pH: 7 (ref 5.0–8.0)

## 2024-03-27 LAB — CBC WITH DIFFERENTIAL/PLATELET
Abs Immature Granulocytes: 0.05 K/uL (ref 0.00–0.07)
Basophils Absolute: 0 K/uL (ref 0.0–0.1)
Basophils Relative: 0 %
Eosinophils Absolute: 0 K/uL (ref 0.0–0.5)
Eosinophils Relative: 0 %
HCT: 42.6 % (ref 36.0–46.0)
Hemoglobin: 13.8 g/dL (ref 12.0–15.0)
Immature Granulocytes: 0 %
Lymphocytes Relative: 7 %
Lymphs Abs: 0.8 K/uL (ref 0.7–4.0)
MCH: 29.9 pg (ref 26.0–34.0)
MCHC: 32.4 g/dL (ref 30.0–36.0)
MCV: 92.4 fL (ref 80.0–100.0)
Monocytes Absolute: 0.8 K/uL (ref 0.1–1.0)
Monocytes Relative: 6 %
Neutro Abs: 10.7 K/uL — ABNORMAL HIGH (ref 1.7–7.7)
Neutrophils Relative %: 87 %
Platelets: 260 K/uL (ref 150–400)
RBC: 4.61 MIL/uL (ref 3.87–5.11)
RDW: 12 % (ref 11.5–15.5)
WBC: 12.4 K/uL — ABNORMAL HIGH (ref 4.0–10.5)
nRBC: 0 % (ref 0.0–0.2)

## 2024-03-27 LAB — COMPREHENSIVE METABOLIC PANEL WITH GFR
ALT: <5 U/L (ref 0–44)
AST: 38 U/L (ref 15–41)
Albumin: 4.3 g/dL (ref 3.5–5.0)
Alkaline Phosphatase: 73 U/L (ref 38–126)
Anion gap: 11 (ref 5–15)
BUN: 14 mg/dL (ref 8–23)
CO2: 26 mmol/L (ref 22–32)
Calcium: 9.6 mg/dL (ref 8.9–10.3)
Chloride: 100 mmol/L (ref 98–111)
Creatinine, Ser: 1.02 mg/dL — ABNORMAL HIGH (ref 0.44–1.00)
GFR, Estimated: 59 mL/min — ABNORMAL LOW (ref >60)
Glucose, Bld: 142 mg/dL — ABNORMAL HIGH (ref 70–99)
Potassium: 3.9 mmol/L (ref 3.5–5.1)
Sodium: 137 mmol/L (ref 135–145)
Total Bilirubin: 0.8 mg/dL (ref 0.0–1.2)
Total Protein: 7.7 g/dL (ref 6.5–8.1)

## 2024-03-27 LAB — TROPONIN T, HIGH SENSITIVITY: Troponin T High Sensitivity: <15 ng/L (ref 0–19)

## 2024-03-27 LAB — LIPASE, BLOOD: Lipase: 36 U/L (ref 11–51)

## 2024-03-27 MED ORDER — ALBUTEROL SULFATE (2.5 MG/3ML) 0.083% IN NEBU: 2.5000 mg | INHALATION_SOLUTION | Freq: Four times a day (QID) | RESPIRATORY_TRACT | Status: DC | PRN

## 2024-03-27 MED ORDER — SODIUM CHLORIDE 0.9 % IV BOLUS
1000.0000 mL | Freq: Once | INTRAVENOUS | Status: AC
  Administered 2024-03-27: 1000 mL via INTRAVENOUS

## 2024-03-27 MED ORDER — ACETAMINOPHEN 325 MG PO TABS
650.0000 mg | ORAL_TABLET | Freq: Four times a day (QID) | ORAL | Status: DC | PRN
Start: 2024-03-27 — End: 2024-04-02

## 2024-03-27 MED ORDER — DIAZEPAM 5 MG/ML IJ SOLN
2.5000 mg | Freq: Once | INTRAMUSCULAR | Status: AC
  Administered 2024-03-27: 2.5 mg via INTRAVENOUS
  Filled 2024-03-27: qty 2

## 2024-03-27 MED ORDER — LIDOCAINE HCL URETHRAL/MUCOSAL 2 % EX GEL
1.0000 | Freq: Once | CUTANEOUS | Status: AC
  Administered 2024-03-27: 1
  Filled 2024-03-27: qty 11

## 2024-03-27 MED ORDER — ENOXAPARIN SODIUM 40 MG/0.4ML IJ SOSY: 40.0000 mg | PREFILLED_SYRINGE | INTRAMUSCULAR | Status: DC

## 2024-03-27 MED ORDER — HYDRALAZINE HCL 20 MG/ML IJ SOLN: 10.0000 mg | INTRAMUSCULAR | Status: DC | PRN

## 2024-03-27 MED ORDER — IOHEXOL 300 MG/ML  SOLN
100.0000 mL | Freq: Once | INTRAMUSCULAR | Status: AC | PRN
  Administered 2024-03-27: 80 mL via INTRAVENOUS

## 2024-03-27 MED ORDER — ONDANSETRON HCL 4 MG/2ML IJ SOLN
4.0000 mg | Freq: Four times a day (QID) | INTRAMUSCULAR | Status: DC | PRN
  Administered 2024-03-28 – 2024-04-09 (×4): 4 mg via INTRAVENOUS
  Filled 2024-03-27 (×4): qty 2

## 2024-03-27 MED ORDER — ACETAMINOPHEN 650 MG RE SUPP: 650.0000 mg | Freq: Four times a day (QID) | RECTAL | Status: DC | PRN

## 2024-03-27 MED ORDER — ALBUTEROL SULFATE HFA 108 (90 BASE) MCG/ACT IN AERS: 1.0000 | INHALATION_SPRAY | Freq: Four times a day (QID) | RESPIRATORY_TRACT | Status: DC | PRN

## 2024-03-27 MED ORDER — HYDROMORPHONE HCL 1 MG/ML IJ SOLN
0.5000 mg | INTRAMUSCULAR | Status: DC | PRN
  Administered 2024-03-28 – 2024-04-06 (×4): 0.5 mg via INTRAVENOUS
  Filled 2024-03-27 (×4): qty 0.5

## 2024-03-27 MED ORDER — LACTATED RINGERS IV SOLN: INTRAVENOUS | Status: AC

## 2024-03-27 MED ORDER — ONDANSETRON HCL 4 MG/2ML IJ SOLN
4.0000 mg | Freq: Once | INTRAMUSCULAR | Status: AC
  Administered 2024-03-27: 4 mg via INTRAVENOUS
  Filled 2024-03-27: qty 2

## 2024-03-27 NOTE — ED Notes (Signed)
 3 attempts were made at NG tube placement. Pt was unable to tolerate any attempt despite medication and lidocaine  application

## 2024-03-27 NOTE — Consult Note (Addendum)
 Reason for Consult: Vomiting Referring Physician: Dr. Mannie Meade Dominique Mooney is an 73 y.o. female.  HPI: The patient is a 73 year old black female who began having nausea and vomiting yesterday at 2 AM.  She denies any abdominal pain.  She does report regular bowel movements as of yesterday.  She came to the emergency department where a CT scan was suggestive of small bowel obstruction likely related to scar tissue from previous surgery.  She does not smoke  Past Medical History:  Diagnosis Date   Allergic rhinitis        Arthritis    Asthma        Chest pressure    a. Normal Stress Echo 05/2010   Complication of anesthesia    GERD (gastroesophageal reflux disease)    Hyperlipidemia    Hypertension    Hypothyroidism    Irritable bowel syndrome    Pancreatitis    after ERCP   Pneumonia    PONV (postoperative nausea and vomiting)     Past Surgical History:  Procedure Laterality Date   BRAVO [facility] STUDY  05/02/2011   Procedure: BRAVO PH STUDY;  Surgeon: Toribio Cedar, MD;  Location: [facility];  Service: Endoscopy;  Laterality: N/A;  48 hour wirless pH test (Bravo Test)   CHOLECYSTECTOMY     ESOPHAGOGASTRODUODENOSCOPY  05/02/2011   Procedure: ESOPHAGOGASTRODUODENOSCOPY (EGD);  Surgeon: Toribio Cedar, MD;  Location: [facility];  Service: Endoscopy;  Laterality: N/A;   HERNIA REPAIR     LEFT HEART CATH AND CORONARY ANGIOGRAPHY N/A 06/26/2017   Procedure: LEFT HEART CATH AND CORONARY ANGIOGRAPHY;  Surgeon: Claudene Victory ORN, MD;  Location: [facility] INVASIVE [facility];  Service: Cardiovascular;  Laterality: N/A;   MENISCUS REPAIR Left 03/18/2016   and repair of cracked bone   SALPINGOOPHORECTOMY     TOTAL ABDOMINAL HYSTERECTOMY     TOTAL KNEE ARTHROPLASTY Left 07/16/2019   Procedure: TOTAL KNEE ARTHROPLASTY;  Surgeon: Gerome Charleston, MD;  Location: [facility];  Service: Orthopedics;  Laterality: Left;    Family History  Problem Relation Age of Onset   Stroke Mother    Stroke Father     Allergies Sister    Colon polyps Sister    Allergies Brother    Heart attack Brother    Heart attack Brother    Allergies Son    Colon cancer Neg Hx    Stomach cancer Neg Hx    Esophageal cancer Neg Hx    Lung disease Neg Hx    Cancer Neg Hx    Rectal cancer Neg Hx    Breast cancer Neg Hx    Inflammatory bowel disease Neg Hx    Liver disease Neg Hx    Pancreatic cancer Neg Hx     Social History:  reports that she has never smoked. She has never used smokeless tobacco. She reports that she does not drink alcohol and does not use drugs.  Allergies:  Allergies  Allergen Reactions   Statins Other (See Comments)    Elevated Liver functions   Penicillins Rash   Lipitor [Atorvastatin ] Other (See Comments)    Abdominal bloating    Medications: I have reviewed the patient's current medications.  Results for orders placed or performed during the hospital encounter of 03/27/24 (from the past 48 hours)  CBC with Differential     Status: Abnormal   Collection Time: 03/27/24  6:33 PM  Result Value Ref Range   WBC 12.4 (H) 4.0 - 10.5 K/uL   RBC  4.61 3.87 - 5.11 MIL/uL   Hemoglobin 13.8 12.0 - 15.0 g/dL   HCT 57.3 63.9 - 53.9 %   MCV 92.4 80.0 - 100.0 fL   MCH 29.9 26.0 - 34.0 pg   MCHC 32.4 30.0 - 36.0 g/dL   RDW 87.9 88.4 - 84.4 %   Platelets 260 150 - 400 K/uL   nRBC 0.0 0.0 - 0.2 %   Neutrophils Relative % 87 %   Neutro Abs 10.7 (H) 1.7 - 7.7 K/uL   Lymphocytes Relative 7 %   Lymphs Abs 0.8 0.7 - 4.0 K/uL   Monocytes Relative 6 %   Monocytes Absolute 0.8 0.1 - 1.0 K/uL   Eosinophils Relative 0 %   Eosinophils Absolute 0.0 0.0 - 0.5 K/uL   Basophils Relative 0 %   Basophils Absolute 0.0 0.0 - 0.1 K/uL   Immature Granulocytes 0 %   Abs Immature Granulocytes 0.05 0.00 - 0.07 K/uL    Comment: Performed at [facility], [address], [place], [place] [address]  Comprehensive metabolic panel     Status: Abnormal   Collection Time: 03/27/24  6:33 PM   Result Value Ref Range   Sodium 137 135 - 145 mmol/L   Potassium 3.9 3.5 - 5.1 mmol/L   Chloride 100 98 - 111 mmol/L   CO2 26 22 - 32 mmol/L   Glucose, Bld 142 (H) 70 - 99 mg/dL    Comment: Glucose reference range applies only to samples taken after fasting for at least 8 hours.   BUN 14 8 - 23 mg/dL   Creatinine, Ser 8.97 (H) 0.44 - 1.00 mg/dL   Calcium  9.6 8.9 - 10.3 mg/dL   Total Protein 7.7 6.5 - 8.1 g/dL   Albumin 4.3 3.5 - 5.0 g/dL   AST 38 15 - 41 U/L   ALT <5 0 - 44 U/L   Alkaline Phosphatase 73 38 - 126 U/L   Total Bilirubin 0.8 0.0 - 1.2 mg/dL   GFR, Estimated 59 (L) >60 mL/min    Comment: (NOTE) Calculated using the CKD-EPI Creatinine Equation (2021)    Anion gap 11 5 - 15    Comment: Performed at [facility], [address] [place], [place] [address]  Lipase, blood     Status: None   Collection Time: 03/27/24  6:33 PM  Result Value Ref Range   Lipase 36 11 - 51 U/L    Comment: Performed at [facility], [address], [place], [place] [address]  Troponin T, High Sensitivity     Status: None   Collection Time: 03/27/24  6:33 PM  Result Value Ref Range   Troponin T High Sensitivity <15 0 - 19 ng/L    Comment: (NOTE) Biotin concentrations > 1000 ng/mL falsely decrease TnT results.  Serial cardiac troponin measurements are suggested.  Refer to the Links section for chest pain algorithms and additional  guidance. Performed at [facility], [address] [place], [place] [address]   Urinalysis, Routine w reflex microscopic -Urine, Clean Catch     Status: Abnormal   Collection Time: 03/27/24  8:12 PM  Result Value Ref Range   Color, Urine STRAW (A) YELLOW   APPearance CLEAR CLEAR   Specific Gravity, Urine 1.013 1.005 - 1.030   pH 7.0 5.0 - 8.0   Glucose, UA NEGATIVE NEGATIVE mg/dL   Hgb urine dipstick NEGATIVE NEGATIVE   Bilirubin Urine NEGATIVE NEGATIVE   Ketones, ur NEGATIVE NEGATIVE  mg/dL    Protein, ur NEGATIVE NEGATIVE mg/dL   Nitrite NEGATIVE NEGATIVE   Leukocytes,Ua NEGATIVE NEGATIVE    Comment: Performed at [facility], [address] [place], [place] [address]    CT ABDOMEN PELVIS W CONTRAST Result Date: 03/27/2024 EXAM: CT ABDOMEN AND PELVIS WITH CONTRAST 03/27/2024 07:46:22 PM TECHNIQUE: CT of the abdomen and pelvis was performed with the administration of 80 mL of iohexol  (OMNIPAQUE ) 300 MG/ML solution. Multiplanar reformatted images are provided for review. Automated exposure control, iterative reconstruction, and/or weight-based adjustment of the mA/kV was utilized to reduce the radiation dose to as low as reasonably achievable. COMPARISON: Comparison with 07/31/2020. CLINICAL HISTORY: Pancreatitis suspected. Vomiting and nausea this is Dr. FINDINGS: LOWER CHEST: Small pericardial effusion is new from prior. LIVER: Hepatic steatosis. GALLBLADDER AND BILE DUCTS: Cholecystectomy. No biliary ductal dilatation. SPLEEN: No acute abnormality. PANCREAS: No acute abnormality. ADRENAL GLANDS: No acute abnormality. KIDNEYS, URETERS AND BLADDER: No stones in the kidneys or ureters. No hydronephrosis. No perinephric or periureteral stranding. Urinary bladder is unremarkable. GI AND BOWEL: Stomach demonstrates no acute abnormality. Small bowel at the upper limits of normal and filled with fluid with wall thickening and thickening of the valvulae conniventes. There is swirling of vessels in the right lower quadrant with 2 adjacent transition points (series 7 image 75). There is fecalization of the bowel between the 2 transition points. Findings are concerning for an obstruction possibly due to internal hernia or closed loop obstruction . Colon is decompressed. PERITONEUM AND RETROPERITONEUM: Small volume pelvic and perihepatic ascites. No free air. VASCULATURE: Aorta is normal in caliber. Aortic atherosclerotic calcification. LYMPH NODES: No lymphadenopathy. REPRODUCTIVE ORGANS:  No acute abnormality. BONES AND SOFT TISSUES: No acute osseous abnormality. No focal soft tissue abnormality. IMPRESSION: 1. Small bowel obstruction with 2 adjacent transition points in the right lower quadrant and fecalization of the bowel between the transition points. This is concerning for internal hernia or closed loop obstruction. No signs of ischemia. Surgical consult recommended. 2. Small pericardial effusion, new from prior. 3. Small volume pelvic and perihepatic ascites. 4. Hepatic steatosis. Critical value/emergent results were called by telephone at the time of interpretation on 11 / 8 / 25 at 08:00 pm to Dr. Mannie, who verbally acknowledged these results. Electronically signed by: Norman Gatlin MD 03/27/2024 08:04 PM EST RP Workstation: [id number]    Review of Systems Blood pressure 138/84, pulse (!) 107, temperature 98.4 F (36.9 C), temperature source Oral, resp. rate 14, height 5' 1 (1.549 m), weight 52.2 kg, SpO2 96%. Physical Exam Constitutional:      General: She is not in acute distress.    Appearance: Normal appearance.  HENT:     Head: Normocephalic and atraumatic.     Right Ear: External ear normal.     Left Ear: External ear normal.     Nose: Nose normal.     Mouth/Throat:     Mouth: Mucous membranes are moist.     Pharynx: Oropharynx is clear.  Eyes:     Extraocular Movements: Extraocular movements intact.     Conjunctiva/sclera: Conjunctivae normal.     Pupils: Pupils are equal, round, and reactive to light.  Cardiovascular:     Rate and Rhythm: Normal rate and regular rhythm.     Pulses: Normal pulses.     Heart sounds: Normal heart sounds.  Pulmonary:     Effort: Pulmonary effort is normal. No respiratory distress.     Breath sounds: Normal breath sounds.  Abdominal:  General: Abdomen is flat.     Palpations: Abdomen is soft.     Comments: There is minimal right sided tenderness.  The abdomen is soft and not distended.  There is no guarding or  peritonitis.  The patient reports having bowel movements.  Musculoskeletal:        General: No swelling or deformity. Normal range of motion.     Cervical back: Normal range of motion and neck supple.  Skin:    General: Skin is warm and dry.     Coloration: Skin is not jaundiced.  Neurological:     General: No focal deficit present.     Mental Status: She is alert and oriented to person, place, and time.  Psychiatric:        Mood and Affect: Mood normal.        Behavior: Behavior normal.     Assessment/Plan: The patient appears to have a small bowel obstruction likely related to scar tissue from previous surgery.  She does have a very soft abdomen that is not very distended with good bowel sounds.  She does not appear to be acutely ill from this process.  At this point I would recommend bowel rest and possible NG tube placement if it can be placed.  She will be admitted to the medical service.  She should receive good IV resuscitation.  If she can be decompressed with an NG tube then we can start the small bowel protocol.  We will follow her closely with you.  She does not appear to have an indication for urgent surgery tonight.  There is no swirling on her CT scan to suggest a closed-loop internal hernia and the small bowel does appear to be fairly diffusely dilated  Deward Null III 03/27/2024, 9:15 PM

## 2024-03-27 NOTE — H&P (Signed)
 History and Physical    [name]:[id number] DOB: 1951/04/11 DOA: 03/27/2024  PCP: Emerick Avelina POUR, PA-C  Patient coming from: Home  I have personally briefly reviewed patient's old medical records in [facility]  Chief Complaint: Nausea, vomiting, abdominal pain  HPI: Dominique Mooney is a 73 y.o. female with medical history significant for asthma, HTN, HLD, hypothyroidism, nonobstructive CAD, CKD stage IIIa, DILI 2/2 statins, post ERCP pancreatitis 2000, memory issues who presented to the ED for evaluation of abdominal pain, nausea, and vomiting.  Patient lives alone at home but her family checks in on her every night.  Last night before she went to bed she was complaining of some abdominal pain.  She woke from sleep around 2 AM with new nausea and vomiting which persisted throughout the day.  She was having some upper abdominal pain associated with this.  Due to persistent symptoms she came to the ED for further evaluation.  She believes her last bowel movement was in the last 1-2 days.  ED Course  Labs/Imaging on admission: I have personally reviewed following labs and imaging studies.  Initial vitals showed BP 157/93, pulse 107, RR 18, temp 98.4 F, SpO2 95% on room air.  Labs showed WBC 12.4, hemoglobin 13.8, platelets 260, sodium 137, potassium 3.9, bicarb 26, BUN 14, creatinine 1.02, serum glucose 142, LFTs within normal limits, lipase 36, troponin T <15.  UA negative for UTI.  CT abdomen/pelvis with contrast IMPRESSION: 1. Small bowel obstruction with 2 adjacent transition points in the right lower quadrant and fecalization of the bowel between the transition points. This is concerning for internal hernia or closed loop obstruction. No signs of ischemia. Surgical consult recommended. 2. Small pericardial effusion, new from prior. 3. Small volume pelvic and perihepatic ascites. 4. Hepatic steatosis. Critical value/emergent results were called by telephone at the time  of interpretation on 11 / 8 / 25 at 08:00 pm to Dr. Mannie, who verbally acknowledged these results.  Patient was given 1 L normal saline, IV Zofran .  EDP spoke with general surgery (Dr. Curvin) who recommended placing NG tube, medical admission, and they will follow in consultation.  The hospitalist service was consulted to admit.  Review of Systems: All systems reviewed and are negative except as documented in history of present illness above.   Past Medical History:  Diagnosis Date   Allergic rhinitis        Arthritis    Asthma        Chest pressure    a. Normal Stress Echo 05/2010   Complication of anesthesia    GERD (gastroesophageal reflux disease)    Hyperlipidemia    Hypertension    Hypothyroidism    Irritable bowel syndrome    Pancreatitis    after ERCP   Pneumonia    PONV (postoperative nausea and vomiting)     Past Surgical History:  Procedure Laterality Date   BRAVO [facility] STUDY  05/02/2011   Procedure: BRAVO PH STUDY;  Surgeon: Toribio Cedar, MD;  [facility]: [facility];  Service: Endoscopy;  Laterality: N/A;  48 hour wirless pH test (Bravo Test)   CHOLECYSTECTOMY     ESOPHAGOGASTRODUODENOSCOPY  05/02/2011   Procedure: ESOPHAGOGASTRODUODENOSCOPY (EGD);  Surgeon: Toribio Cedar, MD;  Location: [facility];  Service: Endoscopy;  Laterality: N/A;   HERNIA REPAIR     LEFT HEART CATH AND CORONARY ANGIOGRAPHY N/A 06/26/2017   Procedure: LEFT HEART CATH AND CORONARY ANGIOGRAPHY;  Surgeon: Claudene Victory ORN, MD;  Location: [facility]  INVASIVE CV LAB;  Service: Cardiovascular;  Laterality: N/A;   MENISCUS REPAIR Left 03/18/2016   and repair of cracked bone   SALPINGOOPHORECTOMY     TOTAL ABDOMINAL HYSTERECTOMY     TOTAL KNEE ARTHROPLASTY Left 07/16/2019   Procedure: TOTAL KNEE ARTHROPLASTY;  Surgeon: Gerome Charleston, MD;  Location: [facility];  Service: Orthopedics;  Laterality: Left;    Social History: Social History   Tobacco Use   Smoking status: Never   Smokeless tobacco: Never   Vaping Use   Vaping status: Never Used  Substance Use Topics   Alcohol use: No   Drug use: No    Allergies  Allergen Reactions   Statins Other (See Comments)    Elevated Liver functions   Penicillins Rash   Lipitor [Atorvastatin ] Other (See Comments)    Abdominal bloating    Family History  Problem Relation Age of Onset   Stroke Mother    Stroke Father    Allergies Sister    Colon polyps Sister    Allergies Brother    Heart attack Brother    Heart attack Brother    Allergies Son    Colon cancer Neg Hx    Stomach cancer Neg Hx    Esophageal cancer Neg Hx    Lung disease Neg Hx    Cancer Neg Hx    Rectal cancer Neg Hx    Breast cancer Neg Hx    Inflammatory bowel disease Neg Hx    Liver disease Neg Hx    Pancreatic cancer Neg Hx      Prior to Admission medications   Medication Sig Start Date End Date Taking? Authorizing Provider  albuterol  (PROVENTIL  HFA) 108 (90 Base) MCG/ACT inhaler INHALE 2 PUFFS INTO THE LUNGS EVERY 6 (SIX) HOURS AS NEEDED. Patient not taking: Reported on 02/16/2024 10/20/18   Shellia Oh, MD  Alirocumab  (PRALUENT ) 150 MG/ML SOAJ Inject 1 pen  into the skin every 14 (fourteen) days. Patient not taking: Reported on 02/16/2024 11/13/21   [name], MD  aspirin  81 MG EC tablet Take 81 mg by mouth daily.    [provider]  budesonide -formoterol  (SYMBICORT ) 80-4.5 MCG/ACT inhaler Inhale 2 puffs into the lungs 2 (two) times daily. 05/07/17   Parrett, Madelin RAMAN, NP  cetirizine (ZYRTEC) 10 MG tablet Take 10 mg by mouth daily as needed.     [provider]  dicyclomine  (BENTYL ) 10 MG capsule TAKE 1 CAPSULE (10 MG TOTAL) BY MOUTH 2 (TWO) TIMES DAILY AS NEEDED FOR SPASMS (ABDOMINAL PAIN). Patient not taking: Reported on 02/16/2024 10/22/21   Teressa Toribio SQUIBB, MD  donepezil (ARICEPT) 5 MG tablet Take one tablet 5mg  daily. 02/16/24   Wertman, Sara E, PA-C  esomeprazole  (NEXIUM ) 40 MG capsule TAKE 1 CAPSULE (40 MG TOTAL) BY MOUTH 2 (TWO) TIMES  DAILY BEFORE A MEAL. Patient not taking: Reported on 02/16/2024 09/18/22   Mansouraty, Aloha Raddle., MD  fish oil-omega-3 fatty acids 1000 MG capsule Take 1 g by mouth daily.     [provider]  fluticasone  (FLONASE ) 50 MCG/ACT nasal spray Place 2 sprays into both nostrils daily. Patient not taking: Reported on 02/16/2024 09/20/13   Sood, Vineet, MD  levothyroxine  (SYNTHROID ) 75 MCG tablet Take 75 mcg by mouth daily. 12/20/19   [provider]  metoprolol  succinate (TOPROL -XL) 25 MG 24 hr tablet Take 1 tablet (25 mg total) by mouth daily. Patient not taking: Reported on 02/16/2024 07/31/22   Lucien Orren SAILOR, PA-C  Multiple Vitamin (MULITIVITAMIN  WITH MINERALS) TABS Take 1 tablet by mouth daily.     [provider]  nitroGLYCERIN  (NITROSTAT ) 0.4 MG SL tablet Place 1 tablet (0.4 mg total) under the tongue every 5 (five) minutes as needed for chest pain. Patient not taking: Reported on 02/16/2024 07/31/22   Lucien Orren SAILOR, PA-C  rosuvastatin  (CRESTOR ) 5 MG tablet Take 1 tablet (5 mg total) by mouth daily. Patient not taking: Reported on 02/16/2024 08/21/22 02/16/24  Lucien Orren SAILOR, PA-C    Physical Exam: Vitals:   03/27/24 1809 03/27/24 1815 03/27/24 1900  BP: (!) 157/93  138/84  Pulse: (!) 107    Resp: 18  14  Temp: 98.4 F (36.9 C)    TempSrc: Oral    SpO2: 95%  96%  Weight: 52.2 kg 52.2 kg   Height: 5' 1 (1.549 m)     Constitutional: Resting in bed with head elevated, NAD, calm, comfortable Eyes: EOMI, lids and conjunctivae normal ENMT: Mucous membranes are moist. Posterior pharynx clear of any exudate or lesions.Normal dentition.  Neck: normal, supple, no masses. Respiratory: clear to auscultation bilaterally, no wheezing, no crackles. Normal respiratory effort. No accessory muscle use.  Cardiovascular: Regular rate and rhythm, no murmurs / rubs / gallops. No extremity edema. 2+ pedal pulses. Abdomen: Soft, no tenderness, no masses palpated. Musculoskeletal: no clubbing  / cyanosis. No joint deformity upper and lower extremities. Good ROM, no contractures. Normal muscle tone.  Skin: no rashes, lesions, ulcers. No induration Neurologic: Sensation intact. Strength 5/5 in all 4.  Psychiatric: Exhibits short-term memory deficits otherwise alert and oriented x 3. Normal mood.   EKG: Personally reviewed. Sinus rhythm, rate 79, no acute ischemic changes.  Assessment/Plan Principal Problem:   Small bowel obstruction (HCC) Active Problems:   Hypothyroidism   Hyperlipidemia   Mild persistent asthma   CAD in native artery   Chronic kidney disease, stage 3a (HCC)   DESHAYLA EMPSON is a 73 y.o. female with medical history significant for asthma, HTN, HLD, hypothyroidism, nonobstructive CAD, CKD stage IIIa, DILI 2/2 statins, post ERCP pancreatitis 2000, memory issues who is admitted with small bowel obstruction.  Assessment and Plan: Small bowel obstruction: CT showed SBO with adjacent transition points in the RLQ, concerning for internal hernia or closed-loop obstruction.  General surgery have consulted and feel SBO is more likely due to scar tissue.  NG tube placement was unsuccessful after several attempts. - Keep n.p.o. - Continue IV fluid hydration overnight - IV antiemetics and analgesics as needed - Further management as per general surgery team  CKD stage IIIa: Renal function stable, continue to monitor.  Asthma: Stable.  Albuterol  available as needed.  Hypothyroidism: TSH was 41.7 on 7/22 secondary to medication nonadherence, improved to 7.73 on recheck 9/23. Synthroid  on hold while NPO.  Can convert to IV formulation if remains n.p.o. >48-72 hours.  Hypertension: Not on antihypertensives as an outpatient.  BP stable.  IV meds available as needed.  Hyperlipidemia: Not on statin due to history of drug-induced liver injury.  Nonobstructive CAD: Stable.  Not on statin due to history of drug-induced liver injury.  Memory impairment: She exhibits  short-term memory impairment but otherwise fully alert and oriented.  She was prescribed donepezil but never started it.   DVT prophylaxis: SCDs Start: 03/27/24 2136 Code Status: Full code, discussed with patient on admission Family Communication: Daughter at bedside Disposition Plan: From home, dispo pending clinical progress Consults called: General Surgery Severity of Illness: The appropriate patient status for this patient  is INPATIENT. Inpatient status is judged to be reasonable and necessary in order to provide the required intensity of service to ensure the patient's safety. The patient's presenting symptoms, physical exam findings, and initial radiographic and laboratory data in the context of their chronic comorbidities is felt to place them at high risk for further clinical deterioration. Furthermore, it is not anticipated that the patient will be medically stable for discharge from the hospital within 2 midnights of admission.   * I certify that at the point of admission it is my clinical judgment that the patient will require inpatient hospital care spanning beyond 2 midnights from the point of admission due to high intensity of service, high risk for further deterioration and high frequency of surveillance required.DEWAINE Jorie Blanch MD [facility]  If 7PM-7AM, please contact night-coverage [email or web address]  03/27/2024, 9:35 PM

## 2024-03-27 NOTE — Hospital Course (Signed)
 Dominique Mooney is a 73 y.o. female with medical history significant for asthma, HTN, HLD, hypothyroidism, nonobstructive CAD, CKD stage IIIa, DILI 2/2 statins, post ERCP pancreatitis 2000, memory issues who is admitted with small bowel obstruction.

## 2024-03-27 NOTE — ED Provider Notes (Signed)
 [place] EMERGENCY DEPARTMENT AT [facility] Provider Note  CSN: [id number] Arrival date & time: 03/27/24 1801  Chief Complaint(s) Abdominal Pain  HPI Dominique Mooney is a 73 y.o. female who is here today for periumbilical pain, vomiting and nausea that began at 2 AM this morning.  Patient has had several episodes of vomiting, is not currently feeling nauseated or having significant abdominal pain.  She has not had any diarrhea.  She has a history of pancreatitis, but states this feels different.  Denies urinary symptoms.   Past Medical History Past Medical History:  Diagnosis Date   Allergic rhinitis        Arthritis    Asthma        Chest pressure    a. 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   Hyperbilirubinemia 09/07/2021   Obstruction of bile duct (HCC) 09/07/2021   Adverse effect of statin 11/17/2020   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   Pain of left lower leg 07/26/2019   Osteoarthritis of left knee 07/16/2019   Pain in left knee 01/29/2018   Coronary artery calcification seen on CT scan 06/26/2017   CAD in native artery 06/26/2017   Cough 11/26/2016   Chest pain of uncertain etiology 11/26/2016   Acute sinusitis 11/26/2016   Abnormal mammogram 10/19/2012   Mastodynia 06/01/2012   ESSENTIAL HYPERTENSION, BENIGN 05/28/2010   Mild persistent asthma 08/02/2008   Allergic rhinitis 01/05/2008   GERD 10/09/2007   CONSTIPATION 10/09/2007    IRRITABLE BOWEL SYNDROME 10/09/2007   Hypothyroidism 10/07/2007   Hyperlipidemia 10/07/2007   PANCREATITIS 10/07/2007   Home Medication(s) Prior to Admission medications   Medication Sig Start Date End Date Taking? Authorizing Provider  albuterol  (PROVENTIL  HFA) 108 (90 Base) MCG/ACT inhaler INHALE 2 PUFFS INTO THE LUNGS EVERY 6 (SIX) HOURS AS NEEDED. Patient not taking: Reported on 02/16/2024 10/20/18   Sood, Vineet, MD  Alirocumab  (PRALUENT ) 150 MG/ML SOAJ Inject 1 pen  into the skin every 14 (fourteen) days. Patient not taking: Reported on 02/16/2024 11/13/21   [name], MD  aspirin  81 MG EC tablet Take 81 mg by mouth daily.    [provider]  budesonide -formoterol  (SYMBICORT ) 80-4.5 MCG/ACT inhaler Inhale 2 puffs into the lungs 2 (two) times daily. 05/07/17   Parrett, Madelin RAMAN, NP  cetirizine (ZYRTEC) 10 MG tablet Take 10 mg by mouth daily as needed.     [provider]  dicyclomine  (BENTYL ) 10 MG capsule TAKE 1 CAPSULE (10 MG TOTAL) BY MOUTH 2 (TWO) TIMES DAILY AS NEEDED FOR SPASMS (ABDOMINAL PAIN). Patient not taking: Reported on 02/16/2024 10/22/21   Teressa Toribio SQUIBB, MD  donepezil (ARICEPT) 5 MG tablet Take one tablet 5mg  daily. 02/16/24   Wertman, Sara E, PA-C  esomeprazole  (NEXIUM ) 40 MG capsule TAKE 1 CAPSULE (40 MG TOTAL) BY MOUTH 2 (TWO) TIMES DAILY BEFORE A MEAL. Patient not taking: Reported on 02/16/2024 09/18/22  Mansouraty, Gabriel Jr., MD  fish oil-omega-3 fatty acids 1000 MG capsule Take 1 g by mouth daily.     [provider]  fluticasone  (FLONASE ) 50 MCG/ACT nasal spray Place 2 sprays into both nostrils daily. Patient not taking: Reported on 02/16/2024 09/20/13   Sood, Vineet, MD  levothyroxine  (SYNTHROID ) 75 MCG tablet Take 75 mcg by mouth daily. 12/20/19   [provider]  metoprolol  succinate (TOPROL -XL) 25 MG 24 hr tablet Take 1 tablet (25 mg total) by mouth daily. Patient not taking: Reported on 02/16/2024 07/31/22   Conte, Tessa N, PA-C   Multiple Vitamin (MULITIVITAMIN WITH MINERALS) TABS Take 1 tablet by mouth daily.     [provider]  nitroGLYCERIN  (NITROSTAT ) 0.4 MG SL tablet Place 1 tablet (0.4 mg total) under the tongue every 5 (five) minutes as needed for chest pain. Patient not taking: Reported on 02/16/2024 07/31/22   Lucien Orren SAILOR, PA-C  rosuvastatin  (CRESTOR ) 5 MG tablet Take 1 tablet (5 mg total) by mouth daily. Patient not taking: Reported on 02/16/2024 08/21/22 02/16/24  Lucien Orren SAILOR DEVONNA                                                                                                                                    Past Surgical History Past Surgical History:  Procedure Laterality Date   BRAVO [facility] STUDY  05/02/2011   Procedure: BRAVO PH STUDY;  Surgeon: Toribio Cedar, MD;  Location: [facility]OSCOPY;  Service: Endoscopy;  Laterality: N/A;  48 hour wirless pH test (Bravo Test)   CHOLECYSTECTOMY     ESOPHAGOGASTRODUODENOSCOPY  05/02/2011   Procedure: ESOPHAGOGASTRODUODENOSCOPY (EGD);  Surgeon: Toribio Cedar, MD;  Location: [facility];  Service: Endoscopy;  Laterality: N/A;   HERNIA REPAIR     LEFT HEART CATH AND CORONARY ANGIOGRAPHY N/A 06/26/2017   Procedure: LEFT HEART CATH AND CORONARY ANGIOGRAPHY;  Surgeon: Claudene Victory ORN, MD;  Location: [facility] INVASIVE CV LAB;  Service: Cardiovascular;  Laterality: N/A;   MENISCUS REPAIR Left 03/18/2016   and repair of cracked bone   SALPINGOOPHORECTOMY     TOTAL ABDOMINAL HYSTERECTOMY     TOTAL KNEE ARTHROPLASTY Left 07/16/2019   Procedure: TOTAL KNEE ARTHROPLASTY;  Surgeon: Gerome Charleston, MD;  Location: [facility]S;  Service: Orthopedics;  Laterality: Left;   Family History Family History  Problem Relation Age of Onset   Stroke Mother    Stroke Father    Allergies Sister    Colon polyps Sister    Allergies Brother    Heart attack Brother    Heart attack Brother    Allergies Son    Colon cancer Neg Hx    Stomach cancer Neg Hx    Esophageal cancer Neg Hx    Lung  disease Neg Hx    Cancer Neg Hx    Rectal cancer Neg Hx    Breast cancer Neg Hx    Inflammatory bowel disease Neg  Hx    Liver disease Neg Hx    Pancreatic cancer Neg Hx     Social History Social History   Tobacco Use   Smoking status: Never   Smokeless tobacco: Never  Vaping Use   Vaping status: Never Used  Substance Use Topics   Alcohol use: No   Drug use: No   Allergies Statins, Penicillins, and Lipitor [atorvastatin ]  Review of Systems Review of Systems  Physical Exam Vital Signs  I have reviewed the triage vital signs BP (!) 157/93 (BP Location: Right Arm)   Pulse (!) 107   Temp 98.4 F (36.9 C) (Oral)   Resp 18   Ht 5' 1 (1.549 m)   Wt 52.2 kg   SpO2 95%   BMI 21.73 kg/m   Physical Exam Vitals and nursing note reviewed.  HENT:     Head: Normocephalic and atraumatic.  Cardiovascular:     Rate and Rhythm: Normal rate.  Pulmonary:     Effort: Pulmonary effort is normal.  Abdominal:     General: Abdomen is flat.     Tenderness: There is no abdominal tenderness.  Skin:    General: Skin is warm.  Neurological:     Mental Status: She is alert.     ED Results and Treatments Labs (all labs ordered are listed, but only abnormal results are displayed) Labs Reviewed  CBC WITH DIFFERENTIAL/PLATELET - Abnormal; Notable for the following components:      Result Value   WBC 12.4 (*)    Neutro Abs 10.7 (*)    All other components within normal limits  COMPREHENSIVE METABOLIC PANEL WITH GFR - Abnormal; Notable for the following components:   Glucose, Bld 142 (*)    Creatinine, Ser 1.02 (*)    GFR, Estimated 59 (*)    All other components within normal limits  LIPASE, BLOOD  URINALYSIS, ROUTINE W REFLEX MICROSCOPIC  TROPONIN T, HIGH SENSITIVITY  TROPONIN T, HIGH SENSITIVITY                                                                                                                          Radiology CT ABDOMEN PELVIS W CONTRAST Result Date:  03/27/2024 EXAM: CT ABDOMEN AND PELVIS WITH CONTRAST 03/27/2024 07:46:22 PM TECHNIQUE: CT of the abdomen and pelvis was performed with the administration of 80 mL of iohexol  (OMNIPAQUE ) 300 MG/ML solution. Multiplanar reformatted images are provided for review. Automated exposure control, iterative reconstruction, and/or weight-based adjustment of the mA/kV was utilized to reduce the radiation dose to as low as reasonably achievable. COMPARISON: Comparison with 07/31/2020. CLINICAL HISTORY: Pancreatitis suspected. Vomiting and nausea this is Dr. FINDINGS: LOWER CHEST: Small pericardial effusion is new from prior. LIVER: Hepatic steatosis. GALLBLADDER AND BILE DUCTS: Cholecystectomy. No biliary ductal dilatation. SPLEEN: No acute abnormality. PANCREAS: No acute abnormality. ADRENAL GLANDS: No acute abnormality. KIDNEYS, URETERS AND BLADDER: No stones in the kidneys or ureters. No hydronephrosis. No perinephric or periureteral stranding. Urinary bladder is unremarkable.  GI AND BOWEL: Stomach demonstrates no acute abnormality. Small bowel at the upper limits of normal and filled with fluid with wall thickening and thickening of the valvulae conniventes. There is swirling of vessels in the right lower quadrant with 2 adjacent transition points (series 7 image 75). There is fecalization of the bowel between the 2 transition points. Findings are concerning for an obstruction possibly due to internal hernia or closed loop obstruction . Colon is decompressed. PERITONEUM AND RETROPERITONEUM: Small volume pelvic and perihepatic ascites. No free air. VASCULATURE: Aorta is normal in caliber. Aortic atherosclerotic calcification. LYMPH NODES: No lymphadenopathy. REPRODUCTIVE ORGANS: No acute abnormality. BONES AND SOFT TISSUES: No acute osseous abnormality. No focal soft tissue abnormality. IMPRESSION: 1. Small bowel obstruction with 2 adjacent transition points in the right lower quadrant and fecalization of the bowel between  the transition points. This is concerning for internal hernia or closed loop obstruction. No signs of ischemia. Surgical consult recommended. 2. Small pericardial effusion, new from prior. 3. Small volume pelvic and perihepatic ascites. 4. Hepatic steatosis. Critical value/emergent results were called by telephone at the time of interpretation on 11 / 8 / 25 at 08:00 pm to Dr. Mannie, who verbally acknowledged these results. Electronically signed by: Norman Gatlin MD 03/27/2024 08:04 PM EST RP Workstation: [id number]    Pertinent labs & imaging results that were available during my care of the patient were reviewed by me and considered in my medical decision making (see MDM for details).  Medications Ordered in ED Medications  lidocaine  (XYLOCAINE ) 2 % jelly 1 Application (has no administration in time range)  diazepam  (VALIUM ) injection 2.5 mg (has no administration in time range)  ondansetron  (ZOFRAN ) injection 4 mg (4 mg Intravenous Given 03/27/24 1856)  sodium chloride  0.9 % bolus 1,000 mL (0 mLs Intravenous Stopped 03/27/24 2000)  iohexol  (OMNIPAQUE ) 300 MG/ML solution 100 mL (80 mLs Intravenous Contrast Given 03/27/24 1939)                                                                                                                                     Procedures Procedures  (including critical care time)  Medical Decision Making / ED Course   This patient presents to the ED for concern of abdominal pain, this involves an extensive number of treatment options, and is a complaint that carries with it a high risk of complications and morbidity.  The differential diagnosis includes gastritis, enteritis, pancreatitis, consider ACS.  MDM: On exam, patient well-appearing.  She is mildly tachycardic.  She has a soft abdomen, no focal tenderness.  Given age, will obtain imaging.  Blood work ordered.  In the event that this is atypical ACS, EKG and troponin ordered although I think this is less  likely.  Reassessment 8:08 PM-patient CT imaging shows concern for internal hernia versus closed-loop bowel obstruction, have reached out to general surgery.  Reassessment 8:15 PM-spoke with Dr. Curvin from general  surgery who recommends NG tube and admission.  Do not believe patient requires emergent surgery at this time.  Will admit to hospitalist service.  Additional history obtained: -Additional history obtained from daughter at bedside -External records from outside source obtained and reviewed including: Chart review including previous notes, labs, imaging, consultation notes   Lab Tests: -I ordered, reviewed, and interpreted labs.   The pertinent results include:   Labs Reviewed  CBC WITH DIFFERENTIAL/PLATELET - Abnormal; Notable for the following components:      Result Value   WBC 12.4 (*)    Neutro Abs 10.7 (*)    All other components within normal limits  COMPREHENSIVE METABOLIC PANEL WITH GFR - Abnormal; Notable for the following components:   Glucose, Bld 142 (*)    Creatinine, Ser 1.02 (*)    GFR, Estimated 59 (*)    All other components within normal limits  LIPASE, BLOOD  URINALYSIS, ROUTINE W REFLEX MICROSCOPIC  TROPONIN T, HIGH SENSITIVITY  TROPONIN T, HIGH SENSITIVITY      Imaging Studies ordered: I ordered imaging studies including CT abdomen pelvis I independently visualized and interpreted imaging. I agree with the radiologist interpretation   Medicines ordered and prescription drug management: Meds ordered this encounter  Medications   ondansetron  (ZOFRAN ) injection 4 mg   sodium chloride  0.9 % bolus 1,000 mL   iohexol  (OMNIPAQUE ) 300 MG/ML solution 100 mL   lidocaine  (XYLOCAINE ) 2 % jelly 1 Application   diazepam  (VALIUM ) injection 2.5 mg    -I have reviewed the patients home medicines and have made adjustments as needed   Cardiac Monitoring: The patient was maintained on a cardiac monitor.  I personally viewed and interpreted the cardiac  monitored which showed an underlying rhythm of: Normal sinus rhythm  Reevaluation: After the interventions noted above, I reevaluated the patient and found that they have :improved  Co morbidities that complicate the patient evaluation  Past Medical History:  Diagnosis Date   Allergic rhinitis        Arthritis    Asthma        Chest pressure    a. Normal Stress Echo 05/2010   Complication of anesthesia    GERD (gastroesophageal reflux disease)    Hyperlipidemia    Hypertension    Hypothyroidism    Irritable bowel syndrome    Pancreatitis    after ERCP   Pneumonia    PONV (postoperative nausea and vomiting)         Final Clinical Impression(s) / ED Diagnoses Final diagnoses:  Small bowel obstruction (HCC)     @PCDICTATION @    Mannie Pac T, DO 03/27/24 2019

## 2024-03-27 NOTE — ED Triage Notes (Signed)
 Here with family from home for abd pain, and NV. V x4 in last 16 hrs. Onset at 2am. Rates abd pain 5/10. Denies diarrhea or fever. H/o thyroid  issues.

## 2024-03-28 ENCOUNTER — Inpatient Hospital Stay (HOSPITAL_COMMUNITY)

## 2024-03-28 DIAGNOSIS — E039 Hypothyroidism, unspecified: Secondary | ICD-10-CM

## 2024-03-28 DIAGNOSIS — E782 Mixed hyperlipidemia: Secondary | ICD-10-CM | POA: Diagnosis not present

## 2024-03-28 DIAGNOSIS — N1831 Chronic kidney disease, stage 3a: Secondary | ICD-10-CM | POA: Diagnosis not present

## 2024-03-28 DIAGNOSIS — J4530 Mild persistent asthma, uncomplicated: Secondary | ICD-10-CM

## 2024-03-28 DIAGNOSIS — K56609 Unspecified intestinal obstruction, unspecified as to partial versus complete obstruction: Secondary | ICD-10-CM | POA: Diagnosis not present

## 2024-03-28 DIAGNOSIS — I2510 Atherosclerotic heart disease of native coronary artery without angina pectoris: Secondary | ICD-10-CM

## 2024-03-28 LAB — CBC
HCT: 38.6 % (ref 36.0–46.0)
Hemoglobin: 12.4 g/dL (ref 12.0–15.0)
MCH: 30.5 pg (ref 26.0–34.0)
MCHC: 32.1 g/dL (ref 30.0–36.0)
MCV: 94.8 fL (ref 80.0–100.0)
Platelets: 214 K/uL (ref 150–400)
RBC: 4.07 MIL/uL (ref 3.87–5.11)
RDW: 12.3 % (ref 11.5–15.5)
WBC: 11.9 K/uL — ABNORMAL HIGH (ref 4.0–10.5)
nRBC: 0 % (ref 0.0–0.2)

## 2024-03-28 LAB — BASIC METABOLIC PANEL WITH GFR
Anion gap: 8 (ref 5–15)
BUN: 10 mg/dL (ref 8–23)
CO2: 23 mmol/L (ref 22–32)
Calcium: 8.6 mg/dL — ABNORMAL LOW (ref 8.9–10.3)
Chloride: 104 mmol/L (ref 98–111)
Creatinine, Ser: 0.87 mg/dL (ref 0.44–1.00)
GFR, Estimated: >60 mL/min (ref >60)
Glucose, Bld: 119 mg/dL — ABNORMAL HIGH (ref 70–99)
Potassium: 4.1 mmol/L (ref 3.5–5.1)
Sodium: 135 mmol/L (ref 135–145)

## 2024-03-28 MED ORDER — HYDROMORPHONE HCL 1 MG/ML IJ SOLN
0.5000 mg | Freq: Once | INTRAMUSCULAR | Status: AC
  Administered 2024-03-28: 0.5 mg via INTRAVENOUS
  Filled 2024-03-28: qty 0.5

## 2024-03-28 MED ORDER — DIATRIZOATE MEGLUMINE & SODIUM 66-10 % PO SOLN
90.0000 mL | Freq: Once | ORAL | Status: AC
  Administered 2024-03-28: 90 mL
  Filled 2024-03-28: qty 90

## 2024-03-28 MED ORDER — LACTATED RINGERS IV SOLN: INTRAVENOUS | Status: DC

## 2024-03-28 NOTE — Plan of Care (Signed)

## 2024-03-28 NOTE — Progress Notes (Signed)
 03/28/2024  Dominique Mooney [id number] 08-22-1950  CARE TEAM: PCP: Emerick Avelina POUR, PA-C  Outpatient Care Team: Patient Care Team: [name], [name], PA-C as PCP - General (Physician Assistant) [name], MD as PCP - Cardiology (Cardiology)  Inpatient Treatment Team: Treatment Team:  Gonfa, Taye T, MD Johann Domino, Paramedic Ccs, Md, MD Bobbette Cartwright, MD Mitchell Cozier, Bethene Lucious Maurilio LITTIE, RN Jerona Domino BROCKS, RN   Problem List:   Principal Problem:   Small bowel obstruction [facility]) Active Problems:   Hypothyroidism   Hyperlipidemia   Mild persistent asthma   CAD in native artery   Chronic kidney disease, stage 3a (HCC)   * No surgery found *      Assessment [facility] Stay = 1 days)      Small bowel obstruction most likely due to adhesions    Plan:  Small bowel protocol.  It is been a challenge to get the NG tube in.  Surgical nursing to attempt this morning.  If cannot tolerate it please have her drink the Gastrografin and do films.  I would follow the small bowel protocol.  Concern by radiology of a possible closed-loop situation.  Mild swirling on coronal views only but not on sagittal nor axial views.  No discomfort down in the right lower quadrant where the concern transition is.  No decline.  Follow-up  Her belly is relatively soft but she does have some discomfort and is hiccuping.  She is remains obstipated so I am skeptical of just giving her diet.    If she deteriorates or does not improve in the next 48/72 hours, she may require operative exploration.  She has no concerning signs of more than more emergent surgery  Rehydrate  -monitor electrolytes & replace as needed  Keep K>4, Mg>2, Phos>3  -VTE prophylaxis- SCDs.  Anticoagulation prophyllaxis SQ as appropriate  -mobilize as tolerated to help recovery.  Enlist therapies in moderate/high risk patients as appropriate  I updated the patient's status to the patient and her close  friend.  Nursing.  Recommendations were made.  Questions were answered.  They expressed understanding & appreciation.     I reviewed nursing notes, ED provider notes, hospitalist notes, last 24 h vitals and pain scores, last 48 h intake and output, last 24 h labs and trends, and last 24 h imaging results.  I have reviewed this patient's available data, including medical history, events of note, test results, etc as part of my evaluation.   A significant portion of that time was spent in counseling. Care during the described time interval was provided by me.  This care required moderate level of medical decision making.  03/28/2024    Subjective: (Chief complaint)  A little more hungry this morning.  Hiccuping.  Close friend in room.  Mild discomfort but not worse  Objective:  Vital signs:  Vitals:   03/27/24 2158 03/27/24 2313 03/28/24 0119 03/28/24 0547  BP:  (!) 174/106 (!) 169/99 108/73  Pulse:  85 97 71  Resp:  15 15 15   Temp: 98.4 F (36.9 C) 99.3 F (37.4 C) 99.5 F (37.5 C) 98.7 F (37.1 C)  TempSrc: Oral Oral Oral Oral  SpO2:  100% 98% 100%  Weight:      Height:        Last BM Date : 03/26/24  Intake/Output   Yesterday:  11/08 0701 - 11/09 0700 In: 1924.7 [I.V.:924.7; IV Piggyback:1000] Out: -  This shift:  No intake/output data recorded.  Bowel function:  Flatus: No  BM:  No  Drain: (No drain)   Physical Exam:  General: Pt awake/alert in no acute distress Eyes: PERRL, normal EOM.  Sclera clear.  No icterus Neuro: CN II-XII intact w/o focal sensory/motor deficits. Lymph: No head/neck/groin lymphadenopathy Psych:  No delerium/psychosis/paranoia.  Oriented x 4 HENT: Normocephalic, Mucus membranes moist.  No thrush Neck: Supple, No tracheal deviation.  No obvious thyromegaly Chest: No pain to chest wall compression.  Good respiratory excursion.  No audible wheezing CV:  Pulses intact.  Regular rhythm.  No major extremity edema MS: Normal  AROM mjr joints.  No obvious deformity  Abdomen: Soft.  Mildy distended.  Mild discomfort left upper quadrant.  No guarding..  No evidence of peritonitis.  No incarcerated hernias.  Ext:   No deformity.  No mjr edema.  No cyanosis Skin: No petechiae / purpurea.  No major sores.  Warm and dry    Results:   Cultures: No results found for this or any previous visit (from the past 720 hours).  Labs: Results for orders placed or performed during the hospital encounter of 03/27/24 (from the past 48 hours)  CBC with Differential     Status: Abnormal   Collection Time: 03/27/24  6:33 PM  Result Value Ref Range   WBC 12.4 (H) 4.0 - 10.5 K/uL   RBC 4.61 3.87 - 5.11 MIL/uL   Hemoglobin 13.8 12.0 - 15.0 g/dL   HCT 57.3 63.9 - 53.9 %   MCV 92.4 80.0 - 100.0 fL   MCH 29.9 26.0 - 34.0 pg   MCHC 32.4 30.0 - 36.0 g/dL   RDW 87.9 88.4 - 84.4 %   Platelets 260 150 - 400 K/uL   nRBC 0.0 0.0 - 0.2 %   Neutrophils Relative % 87 %   Neutro Abs 10.7 (H) 1.7 - 7.7 K/uL   Lymphocytes Relative 7 %   Lymphs Abs 0.8 0.7 - 4.0 K/uL   Monocytes Relative 6 %   Monocytes Absolute 0.8 0.1 - 1.0 K/uL   Eosinophils Relative 0 %   Eosinophils Absolute 0.0 0.0 - 0.5 K/uL   Basophils Relative 0 %   Basophils Absolute 0.0 0.0 - 0.1 K/uL   Immature Granulocytes 0 %   Abs Immature Granulocytes 0.05 0.00 - 0.07 K/uL    Comment: Performed at [facility], [address], [place], [place] [address]  Comprehensive metabolic panel     Status: Abnormal   Collection Time: 03/27/24  6:33 PM  Result Value Ref Range   Sodium 137 135 - 145 mmol/L   Potassium 3.9 3.5 - 5.1 mmol/L   Chloride 100 98 - 111 mmol/L   CO2 26 22 - 32 mmol/L   Glucose, Bld 142 (H) 70 - 99 mg/dL    Comment: Glucose reference range applies only to samples taken after fasting for at least 8 hours.   BUN 14 8 - 23 mg/dL   Creatinine, Ser 8.97 (H) 0.44 - 1.00 mg/dL   Calcium  9.6 8.9 - 10.3 mg/dL   Total Protein 7.7 6.5 - 8.1  g/dL   Albumin 4.3 3.5 - 5.0 g/dL   AST 38 15 - 41 U/L   ALT <5 0 - 44 U/L   Alkaline Phosphatase 73 38 - 126 U/L   Total Bilirubin 0.8 0.0 - 1.2 mg/dL   GFR, Estimated 59 (L) >60 mL/min    Comment: (NOTE) Calculated using the CKD-EPI Creatinine Equation (2021)    Anion gap  11 5 - 15    Comment: Performed at [facility], [address] [place], [place] [address]  Lipase, blood     Status: None   Collection Time: 03/27/24  6:33 PM  Result Value Ref Range   Lipase 36 11 - 51 U/L    Comment: Performed at [facility], [address], [place], [place] [address]  Troponin T, High Sensitivity     Status: None   Collection Time: 03/27/24  6:33 PM  Result Value Ref Range   Troponin T High Sensitivity <15 0 - 19 ng/L    Comment: (NOTE) Biotin concentrations > 1000 ng/mL falsely decrease TnT results.  Serial cardiac troponin measurements are suggested.  Refer to the Links section for chest pain algorithms and additional  guidance. Performed at [facility], [address] [place], [place] [address]   Urinalysis, Routine w reflex microscopic -Urine, Clean Catch     Status: Abnormal   Collection Time: 03/27/24  8:12 PM  Result Value Ref Range   Color, Urine STRAW (A) YELLOW   APPearance CLEAR CLEAR   Specific Gravity, Urine 1.013 1.005 - 1.030   pH 7.0 5.0 - 8.0   Glucose, UA NEGATIVE NEGATIVE mg/dL   Hgb urine dipstick NEGATIVE NEGATIVE   Bilirubin Urine NEGATIVE NEGATIVE   Ketones, ur NEGATIVE NEGATIVE mg/dL   Protein, ur NEGATIVE NEGATIVE mg/dL   Nitrite NEGATIVE NEGATIVE   Leukocytes,Ua NEGATIVE NEGATIVE    Comment: Performed at [facility], [address], [place], [place] [address]  CBC     Status: Abnormal   Collection Time: 03/28/24  5:18 AM  Result Value Ref Range   WBC 11.9 (H) 4.0 - 10.5 K/uL   RBC 4.07 3.87 - 5.11 MIL/uL   Hemoglobin 12.4 12.0 - 15.0 g/dL   HCT 61.3 63.9 - 53.9 %   MCV  94.8 80.0 - 100.0 fL   MCH 30.5 26.0 - 34.0 pg   MCHC 32.1 30.0 - 36.0 g/dL   RDW 87.6 88.4 - 84.4 %   Platelets 214 150 - 400 K/uL   nRBC 0.0 0.0 - 0.2 %    Comment: Performed at [facility], [address] [place], [place] [address]  Basic metabolic panel     Status: Abnormal   Collection Time: 03/28/24  5:18 AM  Result Value Ref Range   Sodium 135 135 - 145 mmol/L   Potassium 4.1 3.5 - 5.1 mmol/L   Chloride 104 98 - 111 mmol/L   CO2 23 22 - 32 mmol/L   Glucose, Bld 119 (H) 70 - 99 mg/dL    Comment: Glucose reference range applies only to samples taken after fasting for at least 8 hours.   BUN 10 8 - 23 mg/dL   Creatinine, Ser 9.12 0.44 - 1.00 mg/dL   Calcium  8.6 (L) 8.9 - 10.3 mg/dL   GFR, Estimated >39 >39 mL/min    Comment: (NOTE) Calculated using the CKD-EPI Creatinine Equation (2021)    Anion gap 8 5 - 15    Comment: Performed at [facility], [address] [place], [place] [address]    Imaging / Studies: CT ABDOMEN PELVIS W CONTRAST Result Date: 03/27/2024 EXAM: CT ABDOMEN AND PELVIS WITH CONTRAST 03/27/2024 07:46:22 PM TECHNIQUE: CT of the abdomen and pelvis was performed with the administration of 80 mL of iohexol  (OMNIPAQUE ) 300 MG/ML solution. Multiplanar reformatted images are provided for review. Automated exposure control, iterative  reconstruction, and/or weight-based adjustment of the mA/kV was utilized to reduce the radiation dose to as low as reasonably achievable. COMPARISON: Comparison with 07/31/2020. CLINICAL HISTORY: Pancreatitis suspected. Vomiting and nausea this is Dr. FINDINGS: LOWER CHEST: Small pericardial effusion is new from prior. LIVER: Hepatic steatosis. GALLBLADDER AND BILE DUCTS: Cholecystectomy. No biliary ductal dilatation. SPLEEN: No acute abnormality. PANCREAS: No acute abnormality. ADRENAL GLANDS: No acute abnormality. KIDNEYS, URETERS AND BLADDER: No stones in the kidneys or ureters. No hydronephrosis. No  perinephric or periureteral stranding. Urinary bladder is unremarkable. GI AND BOWEL: Stomach demonstrates no acute abnormality. Small bowel at the upper limits of normal and filled with fluid with wall thickening and thickening of the valvulae conniventes. There is swirling of vessels in the right lower quadrant with 2 adjacent transition points (series 7 image 75). There is fecalization of the bowel between the 2 transition points. Findings are concerning for an obstruction possibly due to internal hernia or closed loop obstruction . Colon is decompressed. PERITONEUM AND RETROPERITONEUM: Small volume pelvic and perihepatic ascites. No free air. VASCULATURE: Aorta is normal in caliber. Aortic atherosclerotic calcification. LYMPH NODES: No lymphadenopathy. REPRODUCTIVE ORGANS: No acute abnormality. BONES AND SOFT TISSUES: No acute osseous abnormality. No focal soft tissue abnormality. IMPRESSION: 1. Small bowel obstruction with 2 adjacent transition points in the right lower quadrant and fecalization of the bowel between the transition points. This is concerning for internal hernia or closed loop obstruction. No signs of ischemia. Surgical consult recommended. 2. Small pericardial effusion, new from prior. 3. Small volume pelvic and perihepatic ascites. 4. Hepatic steatosis. Critical value/emergent results were called by telephone at the time of interpretation on 11 / 8 / 25 at 08:00 pm to Dr. Mannie, who verbally acknowledged these results. Electronically signed by: Norman Gatlin MD 03/27/2024 08:04 PM EST RP Workstation: [id number]    Medications / Allergies: per chart  Antibiotics: Anti-infectives (From admission, onward)    None         Note: Portions of this report may have been transcribed using voice recognition software. Every effort was made to ensure accuracy; however, inadvertent computerized transcription errors may be present.   Any transcriptional errors that result from this process  are unintentional.    Elspeth KYM Schultze, MD, FACS, MASCRS Esophageal, Gastrointestinal & Colorectal Surgery Robotic and Minimally Invasive Surgery  [facility] [address], [address] [place], [place] [address] [phone] Fax [phone] Main  CONTACT INFORMATION: Weekday (9AM-5PM): Call [facility] main office at [phone] Weeknight (5PM-9AM) or Weekend/Holiday: Check EPIC Web Links tab & use AMION (password  [email or web address]) for General Surgery [facility] coverage  Please, DO NOT use SecureChat  (it is not reliable communication to reach operating surgeons & will lead to a delay in care).   Epic staff messaging available for outpatient concerns needing 1-2 business day response.      03/28/2024  8:14 AM

## 2024-03-28 NOTE — Progress Notes (Signed)
 Mobility Specialist - Progress Note   03/28/24 1359  Mobility  Activity Ambulated with assistance  Level of Assistance Modified independent, requires aide device or extra time  Assistive Device None  Distance Ambulated (ft) 500 ft  Range of Motion/Exercises Active  Activity Response Tolerated well  Mobility Referral Yes  Mobility visit 1 Mobility  Mobility Specialist Start Time (ACUTE ONLY) 1250  Mobility Specialist Stop Time (ACUTE ONLY) 1301  Mobility Specialist Time Calculation (min) (ACUTE ONLY) 11 min   Received in bed and agreed to mobility, had no issues throughout session and returned to bed with all needs met.  Cyndee Ada Mobility Specialist

## 2024-03-28 NOTE — Plan of Care (Signed)
 ?  Problem: Clinical Measurements: ?Goal: Ability to maintain clinical measurements within normal limits will improve ?Outcome: Progressing ?Goal: Will remain free from infection ?Outcome: Progressing ?Goal: Diagnostic test results will improve ?Outcome: Progressing ?  ?

## 2024-03-28 NOTE — Progress Notes (Signed)
 Mobility Specialist - Progress Note   03/28/24 1100  Mobility  Activity Ambulated with assistance  Level of Assistance Modified independent, requires aide device or extra time  Assistive Device None  Distance Ambulated (ft) 500 ft  Range of Motion/Exercises Active  Activity Response Tolerated well  Mobility Referral Yes  Mobility visit 1 Mobility  Mobility Specialist Start Time (ACUTE ONLY) 1033  Mobility Specialist Stop Time (ACUTE ONLY) 1042  Mobility Specialist Time Calculation (min) (ACUTE ONLY) 9 min   Received in bed and agreed to mobility. No issues throughout session, returned to bed with all needs met.  Cyndee Ada Mobility Specialist

## 2024-03-28 NOTE — Progress Notes (Signed)
 PROGRESS NOTE  Dominique Mooney FMW:[id number] DOB: 01/14/1951   PCP: Emerick Avelina POUR, PA-C  Patient is from: Home.  Lives alone.  Family checks on her every night.  DOA: 03/27/2024 LOS: 1  Chief complaints Chief Complaint  Patient presents with   Abdominal Pain     Brief Narrative / Interim history: 73 year old F with PMH of nonobstructive CAD, CKD-3A, memory loss,  DILI due to statin, post ERCP pancreatitis in 2000, asthma, HTN and HLD presented to ED with nausea, vomiting and abdominal pain, and admitted with small bowel obstruction.  CT abdomen and pelvis showed SBO with 2 adjacent transition points in RLQ and fecalization of the bowel between the transition point concerning for internal hernia or closed-loop obstruction.  General surgery consulted and admitted.  Patient had NG tube placed the next day.  Subjective: Seen and examined earlier this morning.  No major events overnight or this morning.  No complaints other than discomfort from NG tube.  She does not recall having nausea, vomiting or abdominal pain.  Limited insight into why she is in the hospital.  She is awake and alert but only oriented to self and place.  She does not think she had bowel movement or flatus in 2 days.   Assessment and plan: Small bowel obstruction: Presented with nausea, vomiting and abdominal pain that patient does not recall.  CT showed SBO with adjacent transition points in the RLQ, concerning for internal hernia or closed-loop obstruction.  General surgery have consulted and feel SBO is more likely due to scar tissue.   - NG tube placement was unsuccessful until the morning of 11/9. - N.p.o., IV fluid, antiemetics and analgesics - Appreciate help by general surgery-small bowel protocol.  Memory impairment/cognitive impairment: She is awake and alert but only oriented to self and place. She does not recall symptoms that brought her to the hospital.     Hypothyroidism:TSH was 41.7 on 7/22 secondary  to medication nonadherence, improved to 7.73 on recheck 9/23. Synthroid  on hold while NPO.  Can convert to IV formulation if remains n.p.o. >48-72 hours.   Hyperlipidemia:Not on statin due to history of drug-induced liver injury.   Nonobstructive CAD: Stable.  Not on statin due to history of drug-induced liver injury.   Essential hypertension: Not on meds at home.  Normotensive.  Mild intermittent asthma: Stable -Bronchodilators as needed  CKD-3A: Stable -Continue monitoring    Body mass index is 21.73 kg/m.          DVT prophylaxis:  SCDs Start: 03/27/24 2136  Code Status: Full code Family Communication: None at bedside Level of care: Med-Surg Status is: Inpatient Remains inpatient appropriate because: SBO   Final disposition: Home   55 minutes with more than 50% spent in reviewing records, counseling patient/family and coordinating care.  Consultants:  General Surgery  Procedures: NG tube insertion  Microbiology summarized: None  Objective: Vitals:   03/27/24 2313 03/28/24 0119 03/28/24 0547 03/28/24 0931  BP: (!) 174/106 (!) 169/99 108/73 (!) 124/92  Pulse: 85 97 71 79  Resp: 15 15 15 16   Temp: 99.3 F (37.4 C) 99.5 F (37.5 C) 98.7 F (37.1 C) 98.2 F (36.8 C)  TempSrc: Oral Oral Oral Oral  SpO2: 100% 98% 100% 100%  Weight:      Height:        Examination:  GENERAL: No apparent distress.  Nontoxic. HEENT: MMM.  Vision and hearing grossly intact.  NG tube in place. NECK: Supple.  No apparent JVD.  RESP:  No IWOB.  Fair aeration bilaterally. CVS:  RRR. Heart sounds normal.  ABD/GI/GU: BS+. Abd soft, NTND.  MSK/EXT:  Moves extremities. No apparent deformity. No edema.  SKIN: no apparent skin lesion or wound NEURO: AA.  Oriented to self and place.  No apparent focal neuro deficit. PSYCH: Calm. Normal affect.   Sch Meds:  Scheduled Meds: Continuous Infusions:  lactated ringers  125 mL/hr at 03/28/24 0741   PRN Meds:.acetaminophen  **OR**  acetaminophen , albuterol , hydrALAZINE, HYDROmorphone  (DILAUDID ) injection, ondansetron  (ZOFRAN ) IV  Antimicrobials: Anti-infectives (From admission, onward)    None        I have personally reviewed the following labs and images: CBC: Recent Labs  Lab 03/27/24 1833 03/28/24 0518  WBC 12.4* 11.9*  NEUTROABS 10.7*  --   HGB 13.8 12.4  HCT 42.6 38.6  MCV 92.4 94.8  PLT 260 214   BMP &GFR Recent Labs  Lab 03/27/24 1833 03/28/24 0518  NA 137 135  K 3.9 4.1  CL 100 104  CO2 26 23  GLUCOSE 142* 119*  BUN 14 10  CREATININE 1.02* 0.87  CALCIUM  9.6 8.6*   Estimated Creatinine Clearance: 44.8 mL/min (by C-G formula based on SCr of 0.87 mg/dL). Liver & Pancreas: Recent Labs  Lab 03/27/24 1833  AST 38  ALT <5  ALKPHOS 73  BILITOT 0.8  PROT 7.7  ALBUMIN 4.3   Recent Labs  Lab 03/27/24 1833  LIPASE 36   No results for input(s): AMMONIA in the last 168 hours. Diabetic: No results for input(s): HGBA1C in the last 72 hours. No results for input(s): GLUCAP in the last 168 hours. Cardiac Enzymes: No results for input(s): CKTOTAL, CKMB, CKMBINDEX, TROPONINI in the last 168 hours. No results for input(s): PROBNP in the last 8760 hours. Coagulation Profile: No results for input(s): INR, PROTIME in the last 168 hours. Thyroid  Function Tests: No results for input(s): TSH, T4TOTAL, FREET4, T3FREE, THYROIDAB in the last 72 hours. Lipid Profile: No results for input(s): CHOL, HDL, LDLCALC, TRIG, CHOLHDL, LDLDIRECT in the last 72 hours. Anemia Panel: No results for input(s): VITAMINB12, FOLATE, FERRITIN, TIBC, IRON, RETICCTPCT in the last 72 hours. Urine analysis:    Component Value Date/Time   COLORURINE STRAW (A) 03/27/2024 2012   APPEARANCEUR CLEAR 03/27/2024 2012   LABSPEC 1.013 03/27/2024 2012   PHURINE 7.0 03/27/2024 2012   GLUCOSEU NEGATIVE 03/27/2024 2012   GLUCOSEU NEGATIVE 07/23/2010 1623   HGBUR  NEGATIVE 03/27/2024 2012   BILIRUBINUR NEGATIVE 03/27/2024 2012   KETONESUR NEGATIVE 03/27/2024 2012   PROTEINUR NEGATIVE 03/27/2024 2012   UROBILINOGEN 0.2 07/23/2010 1623   NITRITE NEGATIVE 03/27/2024 2012   LEUKOCYTESUR NEGATIVE 03/27/2024 2012   Sepsis Labs: Invalid input(s): PROCALCITONIN, LACTICIDVEN  Microbiology: No results found for this or any previous visit (from the past 240 hours).  Radiology Studies: CT ABDOMEN PELVIS W CONTRAST Result Date: 03/27/2024 EXAM: CT ABDOMEN AND PELVIS WITH CONTRAST 03/27/2024 07:46:22 PM TECHNIQUE: CT of the abdomen and pelvis was performed with the administration of 80 mL of iohexol  (OMNIPAQUE ) 300 MG/ML solution. Multiplanar reformatted images are provided for review. Automated exposure control, iterative reconstruction, and/or weight-based adjustment of the mA/kV was utilized to reduce the radiation dose to as low as reasonably achievable. COMPARISON: Comparison with 07/31/2020. CLINICAL HISTORY: Pancreatitis suspected. Vomiting and nausea this is Dr. FINDINGS: LOWER CHEST: Small pericardial effusion is new from prior. LIVER: Hepatic steatosis. GALLBLADDER AND BILE DUCTS: Cholecystectomy. No biliary ductal dilatation. SPLEEN: No acute abnormality. PANCREAS: No acute abnormality. ADRENAL GLANDS:  No acute abnormality. KIDNEYS, URETERS AND BLADDER: No stones in the kidneys or ureters. No hydronephrosis. No perinephric or periureteral stranding. Urinary bladder is unremarkable. GI AND BOWEL: Stomach demonstrates no acute abnormality. Small bowel at the upper limits of normal and filled with fluid with wall thickening and thickening of the valvulae conniventes. There is swirling of vessels in the right lower quadrant with 2 adjacent transition points (series 7 image 75). There is fecalization of the bowel between the 2 transition points. Findings are concerning for an obstruction possibly due to internal hernia or closed loop obstruction . Colon is  decompressed. PERITONEUM AND RETROPERITONEUM: Small volume pelvic and perihepatic ascites. No free air. VASCULATURE: Aorta is normal in caliber. Aortic atherosclerotic calcification. LYMPH NODES: No lymphadenopathy. REPRODUCTIVE ORGANS: No acute abnormality. BONES AND SOFT TISSUES: No acute osseous abnormality. No focal soft tissue abnormality. IMPRESSION: 1. Small bowel obstruction with 2 adjacent transition points in the right lower quadrant and fecalization of the bowel between the transition points. This is concerning for internal hernia or closed loop obstruction. No signs of ischemia. Surgical consult recommended. 2. Small pericardial effusion, new from prior. 3. Small volume pelvic and perihepatic ascites. 4. Hepatic steatosis. Critical value/emergent results were called by telephone at the time of interpretation on 11 / 8 / 25 at 08:00 pm to Dr. Mannie, who verbally acknowledged these results. Electronically signed by: Norman Gatlin MD 03/27/2024 08:04 PM EST RP Workstation: [id number]      Ujbz T. Julious Langlois [facility]  If 7PM-7AM, please contact night-coverage [email or web address] 03/28/2024, 10:36 AM

## 2024-03-29 ENCOUNTER — Inpatient Hospital Stay (HOSPITAL_COMMUNITY)

## 2024-03-29 DIAGNOSIS — E782 Mixed hyperlipidemia: Secondary | ICD-10-CM | POA: Diagnosis not present

## 2024-03-29 DIAGNOSIS — K56609 Unspecified intestinal obstruction, unspecified as to partial versus complete obstruction: Secondary | ICD-10-CM | POA: Diagnosis not present

## 2024-03-29 DIAGNOSIS — I2510 Atherosclerotic heart disease of native coronary artery without angina pectoris: Secondary | ICD-10-CM | POA: Diagnosis not present

## 2024-03-29 DIAGNOSIS — N1831 Chronic kidney disease, stage 3a: Secondary | ICD-10-CM | POA: Diagnosis not present

## 2024-03-29 LAB — RENAL FUNCTION PANEL
Albumin: 3.6 g/dL (ref 3.5–5.0)
Anion gap: 8 (ref 5–15)
BUN: 11 mg/dL (ref 8–23)
CO2: 29 mmol/L (ref 22–32)
Calcium: 9.1 mg/dL (ref 8.9–10.3)
Chloride: 103 mmol/L (ref 98–111)
Creatinine, Ser: 0.88 mg/dL (ref 0.44–1.00)
GFR, Estimated: >60 mL/min (ref >60)
Glucose, Bld: 94 mg/dL (ref 70–99)
Phosphorus: 3 mg/dL (ref 2.5–4.6)
Potassium: 4.2 mmol/L (ref 3.5–5.1)
Sodium: 139 mmol/L (ref 135–145)

## 2024-03-29 LAB — CBC
HCT: 38.4 % (ref 36.0–46.0)
Hemoglobin: 12.7 g/dL (ref 12.0–15.0)
MCH: 31.2 pg (ref 26.0–34.0)
MCHC: 33.1 g/dL (ref 30.0–36.0)
MCV: 94.3 fL (ref 80.0–100.0)
Platelets: 211 K/uL (ref 150–400)
RBC: 4.07 MIL/uL (ref 3.87–5.11)
RDW: 12.2 % (ref 11.5–15.5)
WBC: 9.6 K/uL (ref 4.0–10.5)
nRBC: 0 % (ref 0.0–0.2)

## 2024-03-29 LAB — MAGNESIUM: Magnesium: 2.1 mg/dL (ref 1.7–2.4)

## 2024-03-29 MED ORDER — PHENOL 1.4 % MT LIQD
1.0000 | OROMUCOSAL | Status: DC | PRN
  Administered 2024-03-29: 1 via OROMUCOSAL
  Filled 2024-03-29: qty 177

## 2024-03-29 NOTE — Progress Notes (Signed)
 Mobility Specialist Progress Note:   03/29/24 0924  Mobility  Activity Ambulated with assistance  Level of Assistance Contact guard assist, steadying assist  Assistive Device Front wheel walker  Distance Ambulated (ft) 400 ft  Activity Response Tolerated well  Mobility Referral Yes  Mobility visit 1 Mobility  Mobility Specialist Start Time (ACUTE ONLY) 0847  Mobility Specialist Stop Time (ACUTE ONLY) 0900  Mobility Specialist Time Calculation (min) (ACUTE ONLY) 13 min   Pt ws received in bed and agreed to mobility. CGA sit to stand and ambulation. Pt stated feeling weak due to no food. Returned to bed with all needs met and call bell in reach. Left in room with family.  [employer] - Mobility Specialist

## 2024-03-29 NOTE — Progress Notes (Signed)
 Subjective No acute events. Feeling better today. No flatus/BM yet however. No n/v. Hungry, asking when she can eat.   Objective: Vital signs in last 24 hours: Temp:  [98.4 F (36.9 C)-99.1 F (37.3 C)] 99.1 F (37.3 C) (11/10 0506) Pulse Rate:  [85-89] 86 (11/10 0506) Resp:  [16-18] 18 (11/10 0506) BP: (121-173)/(67-95) 135/67 (11/10 0506) SpO2:  [97 %-99 %] 97 % (11/10 0506) Last BM Date : 03/26/24  Intake/Output from previous day: 11/09 0701 - 11/10 0700 In: 3343.3 [P.O.:60; I.V.:3283.3] Out: 1700 [Urine:800; Emesis/NG output:900] Intake/Output this shift: No intake/output data recorded.  Gen: NAD, comfortable CV: RRR Pulm: Normal work of breathing Abd: Soft, NT/ND. NG in place with bilious effluent Ext: SCDs in place  Lab Results: CBC  Recent Labs    03/28/24 0518 03/29/24 0508  WBC 11.9* 9.6  HGB 12.4 12.7  HCT 38.6 38.4  PLT 214 211   BMET Recent Labs    03/28/24 0518 03/29/24 0508  NA 135 139  K 4.1 4.2  CL 104 103  CO2 23 29  GLUCOSE 119* 94  BUN 10 11  CREATININE 0.87 0.88  CALCIUM  8.6* 9.1   PT/INR No results for input(s): LABPROT, INR in the last 72 hours. ABG No results for input(s): PHART, HCO3 in the last 72 hours.  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   Hyperbilirubinemia 09/07/2021   Obstruction of bile duct (HCC) 09/07/2021   Adverse effect of statin 11/17/2020   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   Pain of left lower leg  07/26/2019   Osteoarthritis of left knee 07/16/2019   Pain in left knee 01/29/2018   Coronary artery calcification seen on CT scan 06/26/2017   CAD in native artery 06/26/2017   Cough 11/26/2016   Chest pain of uncertain etiology 11/26/2016   Acute sinusitis 11/26/2016   Abnormal mammogram 10/19/2012   Mastodynia 06/01/2012   ESSENTIAL HYPERTENSION, BENIGN 05/28/2010   Mild persistent asthma 08/02/2008   Allergic rhinitis 01/05/2008   GERD 10/09/2007   CONSTIPATION 10/09/2007   IRRITABLE BOWEL SYNDROME 10/09/2007   Hypothyroidism 10/07/2007   Hyperlipidemia 10/07/2007   PANCREATITIS 10/07/2007   SBO protocol underway  -F/u x-ray pending; abdominal exam is reassurring without any tenderness nor evident significant distention at present. Afebrile, wbc normal. - Continue NG to low intermittent wall suction - Ambulate as able - Awaiting return of bowel function - Discussed with her at length plan. If no improvement in coming days, potential for surgery to further address. All of their questions were answered, they expressed understanding and agreement with the plan. Would like to hold of on surgery unless absolutely necessary -PPX: SCDs ok for chemical dvt prophylaxis from our perspective   LOS: 2 days   I spent a total of 35 minutes in both face-to-face and non-face-to-face activities, excluding procedures performed, for this visit on the date of this encounter.   Check [email or web address] for General Surgery coverage night/weekend/holidays  No secure chat available for me given surgeries/clinic/off post call which would lead  to a delay in care.    Lonni Pizza, MD [facility], [facility]

## 2024-03-29 NOTE — Plan of Care (Signed)
   Problem: Education: Goal: Knowledge of General Education information will improve Description Including pain rating scale, medication(s)/side effects and non-pharmacologic comfort measures Outcome: Progressing   Problem: Health Behavior/Discharge Planning: Goal: Ability to manage health-related needs will improve Outcome: Progressing

## 2024-03-29 NOTE — Plan of Care (Signed)

## 2024-03-29 NOTE — Progress Notes (Signed)
 PROGRESS NOTE  Dominique Mooney FMW:[id number] DOB: [birth year]   PCP: Emerick Avelina POUR, PA-C  Patient is from: Home.  Lives alone.  Family checks on her every night.  DOA: 03/27/2024 LOS: 2  Chief complaints Chief Complaint  Patient presents with   Abdominal Pain     Brief Narrative / Interim history: 73 year old F with PMH of nonobstructive CAD, CKD-3A, memory loss,  DILI due to statin, post ERCP pancreatitis in 2000, asthma, HTN and HLD presented to ED with nausea, vomiting and abdominal pain, and admitted with small bowel obstruction.  CT abdomen and pelvis showed SBO with 2 adjacent transition points in RLQ and fecalization of the bowel between the transition point concerning for internal hernia or closed-loop obstruction.  General surgery consulted and admitted.  Patient had NG tube placed the next day.  Small bowel protocol consistent with SBO.  General surgery following.  Subjective: Seen and examined earlier this morning.  No major events overnight or this morning.  Reports discomfort in her throat from NG tube.  Denies abdominal pain.  No flatus or bowel movement yet.  Daughter at bedside.   Assessment and plan: Small bowel obstruction: Presented with nausea, vomiting and abdominal pain that patient does not recall.  CT showed SBO with adjacent transition points in the RLQ, concerning for internal hernia or closed-loop obstruction.  SBO protocol with persistent SBO on 11/9. - General Surgery managing-continue NG tube - N.p.o., IV fluid, antiemetics and analgesics - Mobilize patient.  Memory impairment/cognitive impairment: She is awake and alert but only oriented to self and place. She does not recall symptoms that brought her to the hospital.     Hypothyroidism:TSH was 41.7 on 7/22 secondary to medication nonadherence, improved to 7.73 on recheck 9/23. Synthroid  on hold while NPO.   - IV Synthroid  if remains n.p.o. for > 48 to 72 hours.  .   Hyperlipidemia:Not on statin due  to history of drug-induced liver injury.   Nonobstructive CAD: Stable.  Not on statin due to history of drug-induced liver injury.   Essential hypertension: Not on meds at home.  Normotensive.  Mild intermittent asthma: Stable -Bronchodilators as needed  CKD-3A: Stable -Continue monitoring    Body mass index is 21.73 kg/m.          DVT prophylaxis:  SCDs Start: 03/27/24 2136  Code Status: Full code Family Communication: Updated patient's daughter at bedside. Level of care: Med-Surg Status is: Inpatient Remains inpatient appropriate because: SBO   Final disposition: Home   35 minutes with more than 50% spent in reviewing records, counseling patient/family and coordinating care.  Consultants:  General Surgery  Procedures: NG tube insertion  Microbiology summarized: None  Objective: Vitals:   03/28/24 0931 03/28/24 1400 03/28/24 2115 03/29/24 0506  BP: (!) 124/92 (!) 173/95 121/82 135/67  Pulse: 79 89 85 86  Resp: 16 16 18 18   Temp: 98.2 F (36.8 C) 98.8 F (37.1 C) 98.4 F (36.9 C) 99.1 F (37.3 C)  TempSrc: Oral Oral Oral Oral  SpO2: 100% 99% 97% 97%  Weight:      Height:        Examination:  GENERAL: No apparent distress.  Nontoxic. HEENT: MMM.  Vision and hearing grossly intact.  NG tube in place. NECK: Supple.  No apparent JVD.  RESP:  No IWOB.  Fair aeration bilaterally. CVS:  RRR. Heart sounds normal.  ABD/GI/GU: BS+. Abd soft, NTND.  MSK/EXT:  Moves extremities. No apparent deformity. No edema.  SKIN: no apparent  skin lesion or wound NEURO: AA.  Oriented to self, place and family.  No apparent focal neuro deficit. PSYCH: Calm. Normal affect.   Sch Meds:  Scheduled Meds: Continuous Infusions:  lactated ringers  125 mL/hr at 03/29/24 0518   PRN Meds:.acetaminophen  **OR** acetaminophen , albuterol , hydrALAZINE, HYDROmorphone  (DILAUDID ) injection, ondansetron  (ZOFRAN ) IV, phenol  Antimicrobials: Anti-infectives (From admission, onward)     None        I have personally reviewed the following labs and images: CBC: Recent Labs  Lab 03/27/24 1833 03/28/24 0518 03/29/24 0508  WBC 12.4* 11.9* 9.6  NEUTROABS 10.7*  --   --   HGB 13.8 12.4 12.7  HCT 42.6 38.6 38.4  MCV 92.4 94.8 94.3  PLT 260 214 211   BMP &GFR Recent Labs  Lab 03/27/24 1833 03/28/24 0518 03/29/24 0508  NA 137 135 139  K 3.9 4.1 4.2  CL 100 104 103  CO2 26 23 29   GLUCOSE 142* 119* 94  BUN 14 10 11   CREATININE 1.02* 0.87 0.88  CALCIUM  9.6 8.6* 9.1  MG  --   --  2.1  PHOS  --   --  3.0   Estimated Creatinine Clearance: 44.2 mL/min (by C-G formula based on SCr of 0.88 mg/dL). Liver & Pancreas: Recent Labs  Lab 03/27/24 1833 03/29/24 0508  AST 38  --   ALT <5  --   ALKPHOS 73  --   BILITOT 0.8  --   PROT 7.7  --   ALBUMIN 4.3 3.6   Recent Labs  Lab 03/27/24 1833  LIPASE 36   No results for input(s): AMMONIA in the last 168 hours. Diabetic: No results for input(s): HGBA1C in the last 72 hours. No results for input(s): GLUCAP in the last 168 hours. Cardiac Enzymes: No results for input(s): CKTOTAL, CKMB, CKMBINDEX, TROPONINI in the last 168 hours. No results for input(s): PROBNP in the last 8760 hours. Coagulation Profile: No results for input(s): INR, PROTIME in the last 168 hours. Thyroid  Function Tests: No results for input(s): TSH, T4TOTAL, FREET4, T3FREE, THYROIDAB in the last 72 hours. Lipid Profile: No results for input(s): CHOL, HDL, LDLCALC, TRIG, CHOLHDL, LDLDIRECT in the last 72 hours. Anemia Panel: No results for input(s): VITAMINB12, FOLATE, FERRITIN, TIBC, IRON, RETICCTPCT in the last 72 hours. Urine analysis:    Component Value Date/Time   COLORURINE STRAW (A) 03/27/2024 2012   APPEARANCEUR CLEAR 03/27/2024 2012   LABSPEC 1.013 03/27/2024 2012   PHURINE 7.0 03/27/2024 2012   GLUCOSEU NEGATIVE 03/27/2024 2012   GLUCOSEU NEGATIVE 07/23/2010 1623    HGBUR NEGATIVE 03/27/2024 2012   BILIRUBINUR NEGATIVE 03/27/2024 2012   KETONESUR NEGATIVE 03/27/2024 2012   PROTEINUR NEGATIVE 03/27/2024 2012   UROBILINOGEN 0.2 07/23/2010 1623   NITRITE NEGATIVE 03/27/2024 2012   LEUKOCYTESUR NEGATIVE 03/27/2024 2012   Sepsis Labs: Invalid input(s): PROCALCITONIN, LACTICIDVEN  Microbiology: No results found for this or any previous visit (from the past 240 hours).  Radiology Studies: DG Abd Portable 1V-Small Bowel Obstruction Protocol-initial, 8 hr delay Result Date: 03/28/2024 EXAM: UPRIGHT AND SUPINE XRAY VIEWS OF THE ABDOMEN, 2 VIEW(S) 03/28/2024 08:31:53 PM COMPARISON: CT from the previous day. CLINICAL HISTORY: 8-hour small bowel follow-up. FINDINGS: BOWEL: Gastric catheter is again seen in the stomach. Previously administered contrast lies entirely throughout the mildly dilated small bowel. No colonic contrast is noted at this time. 24-hour follow-up is recommended. PERITONEUM AND SOFT TISSUES: No free air. BONES: No acute osseous abnormality. IMPRESSION: 1. Mildly dilated small bowel with intraluminal  contrast and no colonic contrast, compatible with ongoing small-bowel transit delay or obstruction. 2. Recommend 24-hour follow-up radiograph to assess progression of contrast into the colon. Electronically signed by: Oneil Devonshire MD 03/28/2024 08:35 PM EST RP Workstation: [email or web address] Simmer T. Griff Badley [facility]  If 7PM-7AM, please contact night-coverage [email or web address] 03/29/2024, 11:18 AM

## 2024-03-30 ENCOUNTER — Inpatient Hospital Stay (HOSPITAL_COMMUNITY)

## 2024-03-30 DIAGNOSIS — K56609 Unspecified intestinal obstruction, unspecified as to partial versus complete obstruction: Secondary | ICD-10-CM | POA: Diagnosis not present

## 2024-03-30 DIAGNOSIS — I2510 Atherosclerotic heart disease of native coronary artery without angina pectoris: Secondary | ICD-10-CM | POA: Diagnosis not present

## 2024-03-30 DIAGNOSIS — E782 Mixed hyperlipidemia: Secondary | ICD-10-CM | POA: Diagnosis not present

## 2024-03-30 DIAGNOSIS — N1831 Chronic kidney disease, stage 3a: Secondary | ICD-10-CM | POA: Diagnosis not present

## 2024-03-30 LAB — MAGNESIUM: Magnesium: 1.9 mg/dL (ref 1.7–2.4)

## 2024-03-30 LAB — RENAL FUNCTION PANEL
Albumin: 3.6 g/dL (ref 3.5–5.0)
Anion gap: 10 (ref 5–15)
BUN: 12 mg/dL (ref 8–23)
CO2: 26 mmol/L (ref 22–32)
Calcium: 8.9 mg/dL (ref 8.9–10.3)
Chloride: 99 mmol/L (ref 98–111)
Creatinine, Ser: 0.78 mg/dL (ref 0.44–1.00)
GFR, Estimated: >60 mL/min (ref >60)
Glucose, Bld: 84 mg/dL (ref 70–99)
Phosphorus: 2.4 mg/dL — ABNORMAL LOW (ref 2.5–4.6)
Potassium: 3.8 mmol/L (ref 3.5–5.1)
Sodium: 135 mmol/L (ref 135–145)

## 2024-03-30 LAB — CBC
HCT: 35.7 % — ABNORMAL LOW (ref 36.0–46.0)
Hemoglobin: 12.1 g/dL (ref 12.0–15.0)
MCH: 31.3 pg (ref 26.0–34.0)
MCHC: 33.9 g/dL (ref 30.0–36.0)
MCV: 92.2 fL (ref 80.0–100.0)
Platelets: 194 K/uL (ref 150–400)
RBC: 3.87 MIL/uL (ref 3.87–5.11)
RDW: 11.9 % (ref 11.5–15.5)
WBC: 8.9 K/uL (ref 4.0–10.5)
nRBC: 0 % (ref 0.0–0.2)

## 2024-03-30 NOTE — Progress Notes (Signed)
   03/30/24 1617  TOC Brief Assessment  Insurance and Status Reviewed  Patient has primary care physician Yes  Home environment has been reviewed home with family  Prior level of function: independent  Prior/Current Home Services No current home services  Social Drivers of Health Review SDOH reviewed no interventions necessary  Readmission risk has been reviewed Yes  Transition of care needs no transition of care needs at this time

## 2024-03-30 NOTE — Plan of Care (Signed)
  Problem: Education: Goal: Knowledge of General Education information will improve Description: Including pain rating scale, medication(s)/side effects and non-pharmacologic comfort measures Outcome: Progressing   Problem: Pain Managment: Goal: General experience of comfort will improve and/or be controlled Outcome: Progressing

## 2024-03-30 NOTE — Progress Notes (Signed)
 Subjective No acute events. Feeling better today. No flatus/BM yet however. No n/v. Hungry, asking when she can eat.   Objective: Vital signs in last 24 hours: Temp:  [98.8 F (37.1 C)-99.7 F (37.6 C)] 99.7 F (37.6 C) (11/11 0448) Pulse Rate:  [78-89] 78 (11/11 0448) Resp:  [18-20] 18 (11/11 0448) BP: (130-150)/(79-86) 130/79 (11/11 0448) SpO2:  [96 %-99 %] 99 % (11/11 0448) Last BM Date : 03/26/24  Intake/Output from previous day: 11/10 0701 - 11/11 0700 In: 2646.8 [I.V.:2646.8] Out: 2200 [Urine:1600; Emesis/NG output:600] Intake/Output this shift: No intake/output data recorded.  Gen: NAD, comfortable CV: RRR Pulm: Normal work of breathing Abd: Soft, NT, mild distention. NG in place with less bilious effluent Ext: SCDs in place  Lab Results: CBC  Recent Labs    03/29/24 0508 03/30/24 0540  WBC 9.6 8.9  HGB 12.7 12.1  HCT 38.4 35.7*  PLT 211 194   BMET Recent Labs    03/29/24 0508 03/30/24 0540  NA 139 135  K 4.2 3.8  CL 103 99  CO2 29 26  GLUCOSE 94 84  BUN 11 12  CREATININE 0.88 0.78  CALCIUM  9.1 8.9   PT/INR No results for input(s): LABPROT, INR in the last 72 hours. ABG No results for input(s): PHART, HCO3 in the last 72 hours.  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   Hyperbilirubinemia 09/07/2021   Obstruction of bile duct (HCC) 09/07/2021   Adverse effect of statin 11/17/2020   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   Pain of left lower leg  07/26/2019   Osteoarthritis of left knee 07/16/2019   Pain in left knee 01/29/2018   Coronary artery calcification seen on CT scan 06/26/2017   CAD in native artery 06/26/2017   Cough 11/26/2016   Chest pain of uncertain etiology 11/26/2016   Acute sinusitis 11/26/2016   Abnormal mammogram 10/19/2012   Mastodynia 06/01/2012   ESSENTIAL HYPERTENSION, BENIGN 05/28/2010   Mild persistent asthma 08/02/2008   Allergic rhinitis 01/05/2008   GERD 10/09/2007   CONSTIPATION 10/09/2007   IRRITABLE BOWEL SYNDROME 10/09/2007   Hypothyroidism 10/07/2007   Hyperlipidemia 10/07/2007   PANCREATITIS 10/07/2007   SBO protocol underway  -F/u x-ray showed mild improvement but no contrast in colon; abdominal exam is reassurring without any tenderness nor evident significant distention at present. Afebrile, wbc normal. - Continue NG to low intermittent wall suction - output reassurring as only had 100 cc recorded overnight, 600 for last 24 hrs - Ambulate as able - Awaiting return of bowel function - Discussed with her at length again today. If no improvement today into tomorrow, potential for surgery to further address. All of her questions were answered, she expressed understanding and agreement with the plan. -PPX: SCDs ok for chemical dvt prophylaxis from our perspective   LOS: 3 days   I spent a total of 35 minutes in both face-to-face and non-face-to-face activities, excluding procedures performed, for this visit on the date of this encounter.   Check [email or web address] for General Surgery coverage  night/weekend/holidays  No secure chat available for me given surgeries/clinic/off post call which would lead to a delay in care.    Lonni Pizza, MD [facility], [facility]

## 2024-03-30 NOTE — Plan of Care (Signed)

## 2024-03-30 NOTE — Progress Notes (Signed)
 PROGRESS NOTE  Dominique Mooney FMW:[id number] DOB: Jul 28, 1950   PCP: Emerick Avelina POUR, PA-C  Patient is from: Home.  Lives alone.  Family checks on her every night.  DOA: 03/27/2024 LOS: 3  Chief complaints Chief Complaint  Patient presents with   Abdominal Pain     Brief Narrative / Interim history: 73 year old F with PMH of nonobstructive CAD, CKD-3A, memory loss,  DILI due to statin, post ERCP pancreatitis in 2000, asthma, HTN and HLD presented to ED with nausea, vomiting and abdominal pain, and admitted with small bowel obstruction.  CT abdomen and pelvis showed SBO with 2 adjacent transition points in RLQ and fecalization of the bowel between the transition point concerning for internal hernia or closed-loop obstruction.  General surgery consulted and admitted.  Patient had NG tube placed the next day.  Small bowel protocol consistent with SBO.  General surgery following.  Subjective: Seen and examined earlier this morning.  No major events overnight or this morning.  No complaints.  No bowel movement or flatus.  X-ray with persistent SBO.   Assessment and plan: Small bowel obstruction: Presented with nausea, vomiting and abdominal pain that patient does not recall.  CT showed SBO with adjacent transition points in the RLQ, concerning for internal hernia or closed-loop obstruction.  SBO protocol with persistent SBO on 11/9.  Subsequent KUB with persistent SBO. - General Surgery managing-continue NG tube - N.p.o., IV fluid, antiemetics and analgesics - Mobilize patient.  Memory impairment/cognitive impairment: She is awake and alert but only oriented to self and place. She does not recall symptoms that brought her to the hospital.     Hypothyroidism:TSH was 41.7 on 7/22 secondary to medication nonadherence, improved to 7.73 on recheck 9/23. Synthroid  on hold while NPO.   - IV Synthroid  if remains n.p.o. for > 48 to 72 hours.  .   Hyperlipidemia:Not on statin due to history of  drug-induced liver injury.   Nonobstructive CAD: Stable.  Not on statin due to history of drug-induced liver injury.   Essential hypertension: Not on meds at home.  Normotensive.  Mild intermittent asthma: Stable -Bronchodilators as needed  CKD-3A: Stable -Continue monitoring    Body mass index is 21.73 kg/m.          DVT prophylaxis:  SCDs Start: 03/27/24 2136  Code Status: Full code Family Communication: None at bedside today. Level of care: Med-Surg Status is: Inpatient Remains inpatient appropriate because: SBO   Final disposition: Home   35 minutes with more than 50% spent in reviewing records, counseling patient/family and coordinating care.  Consultants:  General Surgery  Procedures: NG tube insertion  Microbiology summarized: None  Objective: Vitals:   03/29/24 0506 03/29/24 1324 03/29/24 2027 03/30/24 0448  BP: 135/67 138/84 (!) 150/86 130/79  Pulse: 86 86 89 78  Resp: 18 20 18 18   Temp: 99.1 F (37.3 C) 98.8 F (37.1 C) 99.3 F (37.4 C) 99.7 F (37.6 C)  TempSrc: Oral  Oral Oral  SpO2: 97% 96% 99% 99%  Weight:      Height:        Examination:  GENERAL: No apparent distress.  Nontoxic. HEENT: MMM.  Vision and hearing grossly intact.  NG tube in place. NECK: Supple.  No apparent JVD.  RESP:  No IWOB.  Fair aeration bilaterally. CVS:  RRR. Heart sounds normal.  ABD/GI/GU: BS+. Abd soft, NTND.  MSK/EXT:  Moves extremities. No apparent deformity. No edema.  SKIN: no apparent skin lesion or wound NEURO: AA.  Oriented to self, place and family.  No apparent focal neuro deficit. PSYCH: Calm. Normal affect.   Sch Meds:  Scheduled Meds: Continuous Infusions:  lactated ringers  125 mL/hr at 03/30/24 0003   PRN Meds:.acetaminophen  **OR** acetaminophen , albuterol , hydrALAZINE, HYDROmorphone  (DILAUDID ) injection, ondansetron  (ZOFRAN ) IV, phenol  Antimicrobials: Anti-infectives (From admission, onward)    None        I have  personally reviewed the following labs and images: CBC: Recent Labs  Lab 03/27/24 1833 03/28/24 0518 03/29/24 0508 03/30/24 0540  WBC 12.4* 11.9* 9.6 8.9  NEUTROABS 10.7*  --   --   --   HGB 13.8 12.4 12.7 12.1  HCT 42.6 38.6 38.4 35.7*  MCV 92.4 94.8 94.3 92.2  PLT 260 214 211 194   BMP &GFR Recent Labs  Lab 03/27/24 1833 03/28/24 0518 03/29/24 0508 03/30/24 0540  NA 137 135 139 135  K 3.9 4.1 4.2 3.8  CL 100 104 103 99  CO2 26 23 29 26   GLUCOSE 142* 119* 94 84  BUN 14 10 11 12   CREATININE 1.02* 0.87 0.88 0.78  CALCIUM  9.6 8.6* 9.1 8.9  MG  --   --  2.1 1.9  PHOS  --   --  3.0 2.4*   Estimated Creatinine Clearance: 48.7 mL/min (by C-G formula based on SCr of 0.78 mg/dL). Liver & Pancreas: Recent Labs  Lab 03/27/24 1833 03/29/24 0508 03/30/24 0540  AST 38  --   --   ALT <5  --   --   ALKPHOS 73  --   --   BILITOT 0.8  --   --   PROT 7.7  --   --   ALBUMIN 4.3 3.6 3.6   Recent Labs  Lab 03/27/24 1833  LIPASE 36   No results for input(s): AMMONIA in the last 168 hours. Diabetic: No results for input(s): HGBA1C in the last 72 hours. No results for input(s): GLUCAP in the last 168 hours. Cardiac Enzymes: No results for input(s): CKTOTAL, CKMB, CKMBINDEX, TROPONINI in the last 168 hours. No results for input(s): PROBNP in the last 8760 hours. Coagulation Profile: No results for input(s): INR, PROTIME in the last 168 hours. Thyroid  Function Tests: No results for input(s): TSH, T4TOTAL, FREET4, T3FREE, THYROIDAB in the last 72 hours. Lipid Profile: No results for input(s): CHOL, HDL, LDLCALC, TRIG, CHOLHDL, LDLDIRECT in the last 72 hours. Anemia Panel: No results for input(s): VITAMINB12, FOLATE, FERRITIN, TIBC, IRON, RETICCTPCT in the last 72 hours. Urine analysis:    Component Value Date/Time   COLORURINE STRAW (A) 03/27/2024 2012   APPEARANCEUR CLEAR 03/27/2024 2012   LABSPEC 1.013 03/27/2024  2012   PHURINE 7.0 03/27/2024 2012   GLUCOSEU NEGATIVE 03/27/2024 2012   GLUCOSEU NEGATIVE 07/23/2010 1623   HGBUR NEGATIVE 03/27/2024 2012   BILIRUBINUR NEGATIVE 03/27/2024 2012   KETONESUR NEGATIVE 03/27/2024 2012   PROTEINUR NEGATIVE 03/27/2024 2012   UROBILINOGEN 0.2 07/23/2010 1623   NITRITE NEGATIVE 03/27/2024 2012   LEUKOCYTESUR NEGATIVE 03/27/2024 2012   Sepsis Labs: Invalid input(s): PROCALCITONIN, LACTICIDVEN  Microbiology: No results found for this or any previous visit (from the past 240 hours).  Radiology Studies: DG Abd Portable 1V Result Date: 03/30/2024 CLINICAL DATA:  Small bowel obstruction. EXAM: PORTABLE ABDOMEN - 1 VIEW COMPARISON:  03/29/2024 FINDINGS: Gaseous distention of small bowel persists measuring up to 3.6 cm diameter. In situ tip is noted in the stomach, as before. IMPRESSION: Persistent gaseous distention of small bowel compatible with small bowel obstruction. No substantial  change. Electronically Signed   By: Camellia Candle M.D.   On: 03/30/2024 07:57      Lillyian Heidt T. Paytyn Mesta [facility]  If 7PM-7AM, please contact night-coverage [email or web address] 03/30/2024, 11:46 AM

## 2024-03-31 DIAGNOSIS — K56609 Unspecified intestinal obstruction, unspecified as to partial versus complete obstruction: Secondary | ICD-10-CM | POA: Diagnosis not present

## 2024-03-31 DIAGNOSIS — E782 Mixed hyperlipidemia: Secondary | ICD-10-CM | POA: Diagnosis not present

## 2024-03-31 DIAGNOSIS — N1831 Chronic kidney disease, stage 3a: Secondary | ICD-10-CM | POA: Diagnosis not present

## 2024-03-31 DIAGNOSIS — I2510 Atherosclerotic heart disease of native coronary artery without angina pectoris: Secondary | ICD-10-CM | POA: Diagnosis not present

## 2024-03-31 LAB — RENAL FUNCTION PANEL
Albumin: 3.9 g/dL (ref 3.5–5.0)
Anion gap: 13 (ref 5–15)
BUN: 11 mg/dL (ref 8–23)
CO2: 27 mmol/L (ref 22–32)
Calcium: 9.3 mg/dL (ref 8.9–10.3)
Chloride: 94 mmol/L — ABNORMAL LOW (ref 98–111)
Creatinine, Ser: 0.72 mg/dL (ref 0.44–1.00)
GFR, Estimated: >60 mL/min (ref >60)
Glucose, Bld: 82 mg/dL (ref 70–99)
Phosphorus: 2.8 mg/dL (ref 2.5–4.6)
Potassium: 3.7 mmol/L (ref 3.5–5.1)
Sodium: 133 mmol/L — ABNORMAL LOW (ref 135–145)

## 2024-03-31 LAB — CBC
HCT: 38.1 % (ref 36.0–46.0)
Hemoglobin: 13 g/dL (ref 12.0–15.0)
MCH: 30.8 pg (ref 26.0–34.0)
MCHC: 34.1 g/dL (ref 30.0–36.0)
MCV: 90.3 fL (ref 80.0–100.0)
Platelets: 225 K/uL (ref 150–400)
RBC: 4.22 MIL/uL (ref 3.87–5.11)
RDW: 11.7 % (ref 11.5–15.5)
WBC: 7 K/uL (ref 4.0–10.5)
nRBC: 0 % (ref 0.0–0.2)

## 2024-03-31 LAB — TYPE AND SCREEN
ABO/RH(D): A POS
Antibody Screen: NEGATIVE

## 2024-03-31 LAB — MAGNESIUM: Magnesium: 2 mg/dL (ref 1.7–2.4)

## 2024-03-31 MED ORDER — PROPOFOL 10 MG/ML IV BOLUS
INTRAVENOUS | Status: AC
Start: 2024-03-31 — End: 2024-03-31
  Filled 2024-03-31: qty 20

## 2024-03-31 MED ORDER — SODIUM CHLORIDE 0.9 % IV SOLN
2.0000 g | INTRAVENOUS | Status: DC
  Filled 2024-03-31: qty 2

## 2024-03-31 MED ORDER — ONDANSETRON HCL 4 MG/2ML IJ SOLN
INTRAMUSCULAR | Status: AC
Start: 2024-03-31 — End: 2024-03-31
  Filled 2024-03-31: qty 2

## 2024-03-31 MED ORDER — FENTANYL CITRATE (PF) 100 MCG/2ML IJ SOLN
INTRAMUSCULAR | Status: AC
  Filled 2024-03-31: qty 2

## 2024-03-31 MED ORDER — LACTATED RINGERS IV SOLN: INTRAVENOUS | Status: DC

## 2024-03-31 MED ORDER — SUCCINYLCHOLINE CHLORIDE 200 MG/10ML IV SOSY
PREFILLED_SYRINGE | INTRAVENOUS | Status: AC
  Filled 2024-03-31: qty 10

## 2024-03-31 MED ORDER — PHENYLEPHRINE 80 MCG/ML (10ML) SYRINGE FOR IV PUSH (FOR BLOOD PRESSURE SUPPORT)
PREFILLED_SYRINGE | INTRAVENOUS | Status: AC
Start: 2024-03-31 — End: 2024-03-31
  Filled 2024-03-31: qty 10

## 2024-03-31 MED ORDER — SODIUM CHLORIDE 0.9 % IV SOLN: 2.0000 g | INTRAVENOUS | Status: DC

## 2024-03-31 MED ORDER — LIDOCAINE HCL (PF) 2 % IJ SOLN
INTRAMUSCULAR | Status: AC
  Filled 2024-03-31: qty 5

## 2024-03-31 MED ORDER — LIDOCAINE HCL (PF) 2 % IJ SOLN
INTRAMUSCULAR | Status: AC
Start: 2024-03-31 — End: 2024-03-31
  Filled 2024-03-31: qty 5

## 2024-03-31 MED ORDER — ROCURONIUM BROMIDE 10 MG/ML (PF) SYRINGE
PREFILLED_SYRINGE | INTRAVENOUS | Status: AC
  Filled 2024-03-31: qty 10

## 2024-03-31 MED ORDER — PHENYLEPHRINE HCL (PRESSORS) 10 MG/ML IV SOLN
INTRAVENOUS | Status: AC
Start: 2024-03-31 — End: 2024-03-31
  Filled 2024-03-31: qty 1

## 2024-03-31 MED ORDER — MIDAZOLAM HCL 2 MG/2ML IJ SOLN
INTRAMUSCULAR | Status: AC
  Filled 2024-03-31: qty 2

## 2024-03-31 MED ORDER — LEVOTHYROXINE SODIUM 100 MCG/5ML IV SOLN
50.0000 ug | Freq: Every day | INTRAVENOUS | Status: DC
  Administered 2024-04-02 – 2024-04-13 (×11): 50 ug via INTRAVENOUS
  Filled 2024-03-31 (×12): qty 5

## 2024-03-31 MED ORDER — CHLORHEXIDINE GLUCONATE 0.12 % MT SOLN: 15.0000 mL | Freq: Once | OROMUCOSAL | Status: DC

## 2024-03-31 MED ORDER — ORAL CARE MOUTH RINSE: 15.0000 mL | Freq: Once | OROMUCOSAL | Status: DC

## 2024-03-31 NOTE — Progress Notes (Signed)
 PROGRESS NOTE  Dominique Mooney FMW:[id number] DOB: 1951-01-24   PCP: Emerick Avelina POUR, PA-C  Patient is from: Home.  Lives alone.  Family checks on her every night.  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  CT abdomen and pelvis showed SBO with 2 adjacent transition points in RLQ and fecalization of the bowel between the transition point concerning for internal hernia or closed-loop obstruction.  General surgery consulted and admitted.  Patient had NG tube placed the next day.  SBO persisted.  Surgery to take patient to OR today  Subjective: Seen and examined earlier this morning.  No major events overnight or this morning.  No complaints.  No bowel movement or flatus.   Assessment and plan: Small bowel obstruction: Presented with nausea, vomiting and abdominal pain that patient does not recall.  CT showed SBO with adjacent transition points in the RLQ, concerning for internal hernia or closed-loop obstruction.  SBO persisted despite NG tube decompression. - General Surgery managing-planning to take patient to OR today. - N.p.o., IV fluid, antiemetics and analgesics - Mobilize patient.  Memory impairment/cognitive impairment: She is awake and alert but only oriented to self and place. She does not recall symptoms that brought her to the hospital.     Hypothyroidism:TSH was 41.7 on 7/22 secondary to medication nonadherence, improved to 7.73 on recheck 9/23. Synthroid  on hold while NPO.   - Start IV Synthroid  50 mcg daily.   Hyperlipidemia:Not on statin due to history of drug-induced liver injury.   Nonobstructive CAD: Stable.  Not on statin due to  history of drug-induced liver injury.   Essential hypertension: Not on meds at home.  Normotensive.  Mild intermittent asthma: Stable -Bronchodilators as needed  Hyponatremia: Mild - Monitor  CKD-3A: Stable -Continue monitoring    Body mass index is 21.73 kg/m.          DVT prophylaxis:  SCDs Start: 03/27/24 2136  Code Status: Full code Family Communication: Updated patient's daughter at bedside. Level of care: Med-Surg Status is: Inpatient Remains inpatient appropriate because: SBO   Final disposition: Home   35 minutes with more than 50% spent in reviewing records, counseling patient/family and coordinating care.  Consultants:  General Surgery  Procedures: NG tube insertion  Microbiology summarized: None  Objective: Vitals:   03/29/24 2027 03/30/24 0448 03/30/24 2028 03/31/24 0640  BP: (!) 150/86 130/79 (!) 161/93 133/76  Pulse: 89 78 87 96  Resp: 18 18 14 16   Temp: 99.3 F (37.4 C) 99.7 F (37.6 C) 99.3 F (37.4 C) 98.7 F (37.1 C)  TempSrc: Oral Oral Oral Oral  SpO2: 99% 99% 97% 97%  Weight:      Height:        Examination:  GENERAL: No apparent distress.  Nontoxic. HEENT: MMM.  Vision and hearing grossly intact.  NG tube in place. NECK: Supple.  No apparent JVD.  RESP:  No IWOB.  Fair aeration bilaterally. CVS:  RRR. Heart sounds normal.  ABD/GI/GU: BS+. Abd soft, NTND.  MSK/EXT:  Moves extremities. No apparent deformity. No edema.  SKIN: no apparent skin lesion or wound NEURO: AA.  Oriented to self, place and family.  No apparent focal neuro deficit. PSYCH: Calm. Normal affect.   Sch Meds:  Scheduled Meds:  [START ON 04/02/2024] levothyroxine   50 mcg Intravenous Daily   Continuous Infusions:  cefoTEtan (CEFOTAN) IV     lactated ringers  125 mL/hr at 03/31/24 0118   PRN Meds:.acetaminophen  **OR** acetaminophen , albuterol , hydrALAZINE, HYDROmorphone  (DILAUDID ) injection, ondansetron  (ZOFRAN ) IV,  phenol  Antimicrobials: Anti-infectives (From admission, onward)    Start     Dose/Rate Route Frequency Ordered Stop   03/31/24 1300  cefoTEtan (CEFOTAN) 2 g in sodium chloride  0.9 % 100 mL IVPB        2 g 200 mL/hr over 30 Minutes Intravenous On call to O.R. 03/31/24 0909 04/01/24 0559   03/31/24 0945  cefTRIAXone (ROCEPHIN) 2 g in sodium chloride  0.9 % 100 mL IVPB  Status:  Discontinued        2 g 200 mL/hr over 30 Minutes Intravenous On call to O.R. 03/31/24 0854 03/31/24 0909        I have personally reviewed the following labs and images: CBC: Recent Labs  Lab 03/27/24 1833 03/28/24 0518 03/29/24 0508 03/30/24 0540 03/31/24 0516  WBC 12.4* 11.9* 9.6 8.9 7.0  NEUTROABS 10.7*  --   --   --   --   HGB 13.8 12.4 12.7 12.1 13.0  HCT 42.6 38.6 38.4 35.7* 38.1  MCV 92.4 94.8 94.3 92.2 90.3  PLT 260 214 211 194 225   BMP &GFR Recent Labs  Lab 03/27/24 1833 03/28/24 0518 03/29/24 0508 03/30/24 0540 03/31/24 0516  NA 137 135 139 135 133*  K 3.9 4.1 4.2 3.8 3.7  CL 100 104 103 99 94*  CO2 26 23 29 26 27   GLUCOSE 142* 119* 94 84 82  BUN 14 10 11 12 11   CREATININE 1.02* 0.87 0.88 0.78 0.72  CALCIUM  9.6 8.6* 9.1 8.9 9.3  MG  --   --  2.1 1.9 2.0  PHOS  --   --  3.0 2.4* 2.8   Estimated Creatinine Clearance: 48.7 mL/min (by C-G formula based on SCr of 0.72 mg/dL). Liver & Pancreas: Recent Labs  Lab 03/27/24 1833 03/29/24 0508 03/30/24 0540 03/31/24 0516  AST 38  --   --   --   ALT <5  --   --   --   ALKPHOS 73  --   --   --   BILITOT 0.8  --   --   --   PROT 7.7  --   --   --   ALBUMIN 4.3 3.6 3.6 3.9   Recent Labs  Lab 03/27/24 1833  LIPASE 36   No results for input(s): AMMONIA in the last 168 hours. Diabetic: No results for input(s): HGBA1C in the last 72 hours. No results for input(s): GLUCAP in the last 168 hours. Cardiac Enzymes: No results for input(s): CKTOTAL, CKMB, CKMBINDEX, TROPONINI in the last 168 hours. No results for  input(s): PROBNP in the last 8760 hours. Coagulation Profile: No results for input(s): INR, PROTIME in the last 168 hours. Thyroid  Function Tests: No results for input(s): TSH, T4TOTAL, FREET4, T3FREE, THYROIDAB in the last 72 hours. Lipid Profile: No results for input(s): CHOL, HDL, LDLCALC, TRIG, CHOLHDL, LDLDIRECT in the last 72 hours. Anemia Panel: No results for input(s): VITAMINB12, FOLATE, FERRITIN, TIBC, IRON, RETICCTPCT in the last 72 hours. Urine analysis:    Component Value Date/Time   COLORURINE STRAW (A) 03/27/2024 2012   APPEARANCEUR CLEAR 03/27/2024 2012   LABSPEC 1.013 03/27/2024 2012  PHURINE 7.0 03/27/2024 2012   GLUCOSEU NEGATIVE 03/27/2024 2012   GLUCOSEU NEGATIVE 07/23/2010 1623   HGBUR NEGATIVE 03/27/2024 2012   BILIRUBINUR NEGATIVE 03/27/2024 2012   KETONESUR NEGATIVE 03/27/2024 2012   PROTEINUR NEGATIVE 03/27/2024 2012   UROBILINOGEN 0.2 07/23/2010 1623   NITRITE NEGATIVE 03/27/2024 2012   LEUKOCYTESUR NEGATIVE 03/27/2024 2012   Sepsis Labs: Invalid input(s): PROCALCITONIN, LACTICIDVEN  Microbiology: No results found for this or any previous visit (from the past 240 hours).  Radiology Studies: No results found.     Saraia Platner T. Keileigh Vahey [facility]  If 7PM-7AM, please contact night-coverage [email or web address] 03/31/2024, 1:05 PM

## 2024-03-31 NOTE — Anesthesia Preprocedure Evaluation (Addendum)
 Anesthesia Evaluation  Patient identified by MRN, date of birth, ID band Patient confused    Reviewed: Allergy  & Precautions, NPO status , Patient's Chart, lab work & pertinent test results, Unable to perform ROS - Chart review only  History of Anesthesia Complications (+) PONV and history of anesthetic complications  Airway Mallampati: II  TM Distance: >3 FB Neck ROM: Full    Dental  (+) Teeth Intact, Edentulous Upper, Partial Lower, Dental Advisory Given, Missing,    Pulmonary asthma    breath sounds clear to auscultation       Cardiovascular hypertension, Pt. on medications and Pt. on home beta blockers + CAD (Mild, non-obstructive)   Rhythm:Regular Rate:Normal  Coronary CT 2019 1. Left Main:  No significant stenosis. 2. LAD: Proximal LAD: 0.9, mid LAD: 0.83, distal LAD: 0.76. 3. D1: 0.9. 4. LCX: 0.96. 5. RCA: 0.94.   IMPRESSION: 1.  CT FFR analysis showed stenosis in the very distal LAD.   Echo 2019 - Left ventricle: The cavity size was normal. Wall thickness was    normal. Systolic function was normal. The estimated ejection    fraction was in the range of 60% to 65%. Wall motion was normal;    there were no regional wall motion abnormalities. Features are    consistent with a pseudonormal left ventricular filling pattern,    with concomitant abnormal relaxation and increased filling    pressure (grade 2 diastolic dysfunction).      Neuro/Psych   Anxiety     negative neurological ROS     GI/Hepatic ,GERD  Medicated,,(+) Hepatitis - (DILI 2/2 Statin), AutoimmuneSmall Bowel Obstruction s/p NG tube placement   Endo/Other  diabetes, Type 2Hypothyroidism    Renal/GU Renal InsufficiencyRenal disease (2/2 Diabetes (CrCl 48))  negative genitourinary   Musculoskeletal  (+) Arthritis ,    Abdominal   Peds  Hematology negative hematology ROS (+) Hgb 13.3, Plts 235K (04/01/24)   Anesthesia Other Findings Day  of surgery medications reviewed with patient.  Reproductive/Obstetrics negative OB ROS                              Anesthesia Physical Anesthesia Plan  ASA: 3  Anesthesia Plan: General   Post-op Pain Management: Ofirmev  IV (intra-op)*   Induction: Intravenous, Rapid sequence and Cricoid pressure planned  PONV Risk Score and Plan: 4 or greater and Treatment may vary due to age or medical condition, Ondansetron , Propofol  infusion, Dexamethasone  and Midazolam   Airway Management Planned: Oral ETT  Additional Equipment: None  Intra-op Plan:   Post-operative Plan: Extubation in OR  Informed Consent:      Consent reviewed with POA and Dental advisory given  Plan Discussed with: CRNA  Anesthesia Plan Comments: (Daughter, Dominique Mooney, at bedside to discuss consent and procedure. Plan for GETA via RSI, PIV x 2 with possible arterial line based on intraoperative hemodynamics. )         Anesthesia Quick Evaluation

## 2024-03-31 NOTE — Plan of Care (Signed)
  Problem: Education: Goal: Knowledge of General Education information will improve Description: Including pain rating scale, medication(s)/side effects and non-pharmacologic comfort measures Outcome: Progressing   Problem: Pain Managment: Goal: General experience of comfort will improve and/or be controlled Outcome: Progressing

## 2024-03-31 NOTE — Progress Notes (Signed)
 Subjective: No bowel function.  NGT with almost 1.5L out yesterday.  Denies abdominal pain  ROS: See above, otherwise other systems negative  Objective: Vital signs in last 24 hours: Temp:  [98.7 F (37.1 C)-99.3 F (37.4 C)] 98.7 F (37.1 C) (11/12 0640) Pulse Rate:  [87-96] 96 (11/12 0640) Resp:  [14-16] 16 (11/12 0640) BP: (133-161)/(76-93) 133/76 (11/12 0640) SpO2:  [97 %] 97 % (11/12 0640) Last BM Date : 03/26/24  Intake/Output from previous day: 11/11 0701 - 11/12 0700 In: 3016.7 [I.V.:2836.7; NG/GT:180] Out: 2550 [Urine:1100; Emesis/NG output:1450] Intake/Output this shift: No intake/output data recorded.  PE: Gen: NAD HEENT: NGT in place with copious bilious output Abd: soft, NT, ND  Lab Results:  Recent Labs    03/30/24 0540 03/31/24 0516  WBC 8.9 7.0  HGB 12.1 13.0  HCT 35.7* 38.1  PLT 194 225   BMET Recent Labs    03/30/24 0540 03/31/24 0516  NA 135 133*  K 3.8 3.7  CL 99 94*  CO2 26 27  GLUCOSE 84 82  BUN 12 11  CREATININE 0.78 0.72  CALCIUM  8.9 9.3   PT/INR No results for input(s): LABPROT, INR in the last 72 hours. CMP     Component Value Date/Time   NA 133 (L) 03/31/2024 0516   NA 142 08/09/2022 0913   K 3.7 03/31/2024 0516   CL 94 (L) 03/31/2024 0516   CO2 27 03/31/2024 0516   GLUCOSE 82 03/31/2024 0516   BUN 11 03/31/2024 0516   BUN 9 08/09/2022 0913   CREATININE 0.72 03/31/2024 0516   CALCIUM  9.3 03/31/2024 0516   PROT 7.7 03/27/2024 1833   PROT 6.6 08/09/2022 0913   ALBUMIN 3.9 03/31/2024 0516   ALBUMIN 4.0 08/09/2022 0913   AST 38 03/27/2024 1833   ALT <5 03/27/2024 1833   ALKPHOS 73 03/27/2024 1833   BILITOT 0.8 03/27/2024 1833   BILITOT 0.7 08/09/2022 0913   GFRNONAA >60 03/31/2024 0516   GFRAA >60 12/31/2019 1156   Lipase     Component Value Date/Time   LIPASE 36 03/27/2024 1833       Studies/Results: DG Abd Portable 1V Result Date: 03/30/2024 CLINICAL DATA:  Small bowel obstruction. EXAM:  PORTABLE ABDOMEN - 1 VIEW COMPARISON:  03/29/2024 FINDINGS: Gaseous distention of small bowel persists measuring up to 3.6 cm diameter. In situ tip is noted in the stomach, as before. IMPRESSION: Persistent gaseous distention of small bowel compatible with small bowel obstruction. No substantial change. Electronically Signed   By: Camellia Candle M.D.   On: 03/30/2024 07:57    Anti-infectives: Anti-infectives (From admission, onward)    Start     Dose/Rate Route Frequency Ordered Stop   03/31/24 0945  cefTRIAXone (ROCEPHIN) 2 g in sodium chloride  0.9 % 100 mL IVPB        2 g 200 mL/hr over 30 Minutes Intravenous On call to O.R. 03/31/24 9145 04/01/24 0559        Assessment/Plan SBO -remains obstructed with high NGT output and no bowel function -at this point the patient has failed conservative management and we recommend proceeding to the OR for laparotomy -I have discussed this with the patient and she understands and is agreeable to proceed -Rocephin on call to OR -cont NGT/NPO/IVFs  -labs look good this am -d/w primary service  FEN - NPO/NGT/IVFs VTE - ok for chemical prophylaxis from our standpoint, SCDs ID - rocephin on call to OR  I reviewed hospitalist notes,  last 24 h vitals and pain scores, last 48 h intake and output, last 24 h labs and trends, and last 24 h imaging results.   LOS: 4 days    Burnard FORBES Banter , [facility] 03/31/2024, 8:54 AM Please see Amion for pager number during day hours 7:00am-4:30pm or 7:00am -11:30am on weekends

## 2024-03-31 NOTE — Plan of Care (Signed)

## 2024-04-01 ENCOUNTER — Inpatient Hospital Stay (HOSPITAL_COMMUNITY): Admitting: Registered Nurse

## 2024-04-01 ENCOUNTER — Inpatient Hospital Stay (HOSPITAL_COMMUNITY)

## 2024-04-01 ENCOUNTER — Encounter (HOSPITAL_COMMUNITY): Payer: Self-pay | Admitting: Internal Medicine

## 2024-04-01 ENCOUNTER — Encounter (HOSPITAL_COMMUNITY)
Admission: EM | Disposition: A | Discharge status: Home / Self Care | Payer: Self-pay | Source: Home / Self Care | Attending: Student

## 2024-04-01 DIAGNOSIS — I2510 Atherosclerotic heart disease of native coronary artery without angina pectoris: Secondary | ICD-10-CM | POA: Diagnosis not present

## 2024-04-01 DIAGNOSIS — K56609 Unspecified intestinal obstruction, unspecified as to partial versus complete obstruction: Secondary | ICD-10-CM | POA: Diagnosis not present

## 2024-04-01 HISTORY — PX: LAPAROTOMY: SHX154

## 2024-04-01 HISTORY — PX: BOWEL RESECTION: SHX1257

## 2024-04-01 HISTORY — PX: LYSIS OF ADHESION: SHX5961

## 2024-04-01 LAB — RENAL FUNCTION PANEL
Albumin: 3.7 g/dL (ref 3.5–5.0)
Anion gap: 15 (ref 5–15)
BUN: 11 mg/dL (ref 8–23)
CO2: 25 mmol/L (ref 22–32)
Calcium: 9.1 mg/dL (ref 8.9–10.3)
Chloride: 92 mmol/L — ABNORMAL LOW (ref 98–111)
Creatinine, Ser: 0.68 mg/dL (ref 0.44–1.00)
GFR, Estimated: >60 mL/min (ref >60)
Glucose, Bld: 77 mg/dL (ref 70–99)
Phosphorus: 2.6 mg/dL (ref 2.5–4.6)
Potassium: 3.6 mmol/L (ref 3.5–5.1)
Sodium: 132 mmol/L — ABNORMAL LOW (ref 135–145)

## 2024-04-01 LAB — CBC
HCT: 37.9 % (ref 36.0–46.0)
Hemoglobin: 13.3 g/dL (ref 12.0–15.0)
MCH: 31.3 pg (ref 26.0–34.0)
MCHC: 35.1 g/dL (ref 30.0–36.0)
MCV: 89.2 fL (ref 80.0–100.0)
Platelets: 235 K/uL (ref 150–400)
RBC: 4.25 MIL/uL (ref 3.87–5.11)
RDW: 11.8 % (ref 11.5–15.5)
WBC: 8.9 K/uL (ref 4.0–10.5)
nRBC: 0 % (ref 0.0–0.2)

## 2024-04-01 LAB — MAGNESIUM: Magnesium: 2 mg/dL (ref 1.7–2.4)

## 2024-04-01 SURGERY — LAPAROTOMY, EXPLORATORY
Anesthesia: General | Site: Abdomen

## 2024-04-01 MED ORDER — LACTATED RINGERS IV SOLN: INTRAVENOUS | Status: DC

## 2024-04-01 MED ORDER — ONDANSETRON HCL 4 MG/2ML IJ SOLN: 4.0000 mg | Freq: Once | INTRAMUSCULAR | Status: DC | PRN

## 2024-04-01 MED ORDER — PHENYLEPHRINE 80 MCG/ML (10ML) SYRINGE FOR IV PUSH (FOR BLOOD PRESSURE SUPPORT)
PREFILLED_SYRINGE | INTRAVENOUS | Status: AC
  Filled 2024-04-01: qty 10

## 2024-04-01 MED ORDER — PROPOFOL 10 MG/ML IV BOLUS
INTRAVENOUS | Status: AC
  Filled 2024-04-01: qty 20

## 2024-04-01 MED ORDER — ORAL CARE MOUTH RINSE: 15.0000 mL | Freq: Once | OROMUCOSAL | Status: AC

## 2024-04-01 MED ORDER — 0.9 % SODIUM CHLORIDE (POUR BTL) OPTIME
TOPICAL | Status: DC | PRN
  Administered 2024-04-01: 2000 mL

## 2024-04-01 MED ORDER — SODIUM CHLORIDE 0.9 % IV SOLN: 2.0000 g | INTRAVENOUS | Status: DC

## 2024-04-01 MED ORDER — LIDOCAINE 2% (20 MG/ML) 5 ML SYRINGE
INTRAMUSCULAR | Status: DC | PRN
Start: 2024-04-01 — End: 2024-04-01
  Administered 2024-04-01: 80 mg via INTRAVENOUS

## 2024-04-01 MED ORDER — DEXAMETHASONE SOD PHOSPHATE PF 10 MG/ML IJ SOLN
INTRAMUSCULAR | Status: DC | PRN
  Administered 2024-04-01: 6 mg via INTRAVENOUS

## 2024-04-01 MED ORDER — OXYCODONE HCL 5 MG PO TABS: 5.0000 mg | ORAL_TABLET | Freq: Once | ORAL | Status: DC | PRN

## 2024-04-01 MED ORDER — FENTANYL CITRATE (PF) 100 MCG/2ML IJ SOLN
INTRAMUSCULAR | Status: AC
  Filled 2024-04-01: qty 2

## 2024-04-01 MED ORDER — CHLORHEXIDINE GLUCONATE 0.12 % MT SOLN
15.0000 mL | Freq: Once | OROMUCOSAL | Status: AC
  Administered 2024-04-01: 15 mL via OROMUCOSAL

## 2024-04-01 MED ORDER — ONDANSETRON HCL 4 MG/2ML IJ SOLN
INTRAMUSCULAR | Status: DC | PRN
  Administered 2024-04-01: 4 mg via INTRAVENOUS

## 2024-04-01 MED ORDER — FENTANYL CITRATE (PF) 100 MCG/2ML IJ SOLN
INTRAMUSCULAR | Status: DC | PRN
  Administered 2024-04-01 (×2): 50 ug via INTRAVENOUS

## 2024-04-01 MED ORDER — AMISULPRIDE (ANTIEMETIC) 5 MG/2ML IV SOLN: 10.0000 mg | Freq: Once | INTRAVENOUS | Status: DC | PRN

## 2024-04-01 MED ORDER — ACETAMINOPHEN 10 MG/ML IV SOLN: 1000.0000 mg | Freq: Once | INTRAVENOUS | Status: DC | PRN

## 2024-04-01 MED ORDER — OXYCODONE HCL 5 MG/5ML PO SOLN: 5.0000 mg | Freq: Once | ORAL | Status: DC | PRN

## 2024-04-01 MED ORDER — PHENYLEPHRINE 80 MCG/ML (10ML) SYRINGE FOR IV PUSH (FOR BLOOD PRESSURE SUPPORT)
PREFILLED_SYRINGE | INTRAVENOUS | Status: DC | PRN
  Administered 2024-04-01 (×6): 120 ug via INTRAVENOUS

## 2024-04-01 MED ORDER — PROPOFOL 10 MG/ML IV BOLUS
INTRAVENOUS | Status: DC | PRN
  Administered 2024-04-01: 180 mg via INTRAVENOUS

## 2024-04-01 MED ORDER — LACTATED RINGERS IV SOLN
INTRAVENOUS | Status: DC | PRN
Start: 2024-04-01 — End: 2024-04-01

## 2024-04-01 MED ORDER — FENTANYL CITRATE (PF) 50 MCG/ML IJ SOSY
25.0000 ug | PREFILLED_SYRINGE | INTRAMUSCULAR | Status: DC | PRN
  Administered 2024-04-01: 50 ug via INTRAVENOUS

## 2024-04-01 MED ORDER — ROCURONIUM BROMIDE 10 MG/ML (PF) SYRINGE
PREFILLED_SYRINGE | INTRAVENOUS | Status: DC | PRN
  Administered 2024-04-01: 50 mg via INTRAVENOUS
  Administered 2024-04-01: 10 mg via INTRAVENOUS

## 2024-04-01 MED ORDER — SUGAMMADEX SODIUM 200 MG/2ML IV SOLN
INTRAVENOUS | Status: DC | PRN
  Administered 2024-04-01: 200 mg via INTRAVENOUS

## 2024-04-01 MED ORDER — SODIUM CHLORIDE 0.9 % IV SOLN
2.0000 g | INTRAVENOUS | Status: AC
  Administered 2024-04-01: 2 g via INTRAVENOUS
  Filled 2024-04-01: qty 2

## 2024-04-01 MED ORDER — ROCURONIUM BROMIDE 10 MG/ML (PF) SYRINGE
PREFILLED_SYRINGE | INTRAVENOUS | Status: AC
  Filled 2024-04-01: qty 20

## 2024-04-01 MED ORDER — FENTANYL CITRATE (PF) 50 MCG/ML IJ SOSY
PREFILLED_SYRINGE | INTRAMUSCULAR | Status: AC
  Filled 2024-04-01: qty 1

## 2024-04-01 SURGICAL SUPPLY — 42 items
BAG COUNTER SPONGE SURGICOUNT (BAG) IMPLANT
BLADE EXTENDED COATED 6.5IN (ELECTRODE) ×2 IMPLANT
CHLORAPREP W/TINT 26 (MISCELLANEOUS) ×2 IMPLANT
COVER MAYO STAND STRL (DRAPES) ×2 IMPLANT
DRAIN CHANNEL 19F RND (DRAIN) IMPLANT
DRAPE LAPAROSCOPIC ABDOMINAL (DRAPES) ×2 IMPLANT
DRAPE SHEET LG 3/4 BI-LAMINATE (DRAPES) IMPLANT
DRAPE UTILITY XL STRL (DRAPES) ×2 IMPLANT
DRAPE WARM FLUID 44X44 (DRAPES) ×2 IMPLANT
DRSG OPSITE POSTOP 4X8 (GAUZE/BANDAGES/DRESSINGS) IMPLANT
ELECT REM PT RETURN 15FT ADLT (MISCELLANEOUS) ×2 IMPLANT
EVACUATOR SILICONE 100CC (DRAIN) IMPLANT
GAUZE SPONGE 4X4 12PLY STRL (GAUZE/BANDAGES/DRESSINGS) ×2 IMPLANT
GLOVE ECLIPSE 8.0 STRL XLNG CF (GLOVE) ×4 IMPLANT
GLOVE INDICATOR 8.0 STRL GRN (GLOVE) ×2 IMPLANT
GOWN STRL REUS W/ TWL XL LVL3 (GOWN DISPOSABLE) ×4 IMPLANT
HANDLE SUCTION POOLE (INSTRUMENTS) ×2 IMPLANT
KIT BASIN OR (CUSTOM PROCEDURE TRAY) ×2 IMPLANT
KIT TURNOVER KIT A (KITS) ×2 IMPLANT
LEGGING LITHOTOMY PAIR STRL (DRAPES) IMPLANT
LIGASURE IMPACT 36 18CM CVD LR (INSTRUMENTS) IMPLANT
PACK GENERAL/GYN (CUSTOM PROCEDURE TRAY) ×2 IMPLANT
PAD POSITIONING PINK XL (MISCELLANEOUS) ×2 IMPLANT
RELOAD STAPLE 75 3.8 BLU REG (ENDOMECHANICALS) IMPLANT
RETRACTOR WND ALEXIS 18 MED (MISCELLANEOUS) IMPLANT
STAPLER GUN LINEAR PROX 60 (STAPLE) IMPLANT
STAPLER PROXIMATE 75MM BLUE (STAPLE) IMPLANT
STAPLER SKIN PROX 35W (STAPLE) ×2 IMPLANT
SUT ETHILON 3 0 PS 1 (SUTURE) IMPLANT
SUT MNCRL AB 4-0 PS2 18 (SUTURE) IMPLANT
SUT NOVA 1 T20/GS 25DT (SUTURE) IMPLANT
SUT PDS AB 1 CT1 27 (SUTURE) IMPLANT
SUT PDS AB 1 TP1 96 (SUTURE) IMPLANT
SUT SILK 2 0 SH CR/8 (SUTURE) ×2 IMPLANT
SUT SILK 2-0 18XBRD TIE 12 (SUTURE) ×2 IMPLANT
SUT SILK 3 0 SH CR/8 (SUTURE) ×2 IMPLANT
SUT SILK 3-0 18XBRD TIE 12 (SUTURE) ×2 IMPLANT
SUT VIC AB 2-0 SH 18 (SUTURE) IMPLANT
SUT VIC AB 3-0 SH 18 (SUTURE) IMPLANT
TOWEL OR DSP ST BLU DLX 10/PK (DISPOSABLE) ×4 IMPLANT
TRAY FOLEY MTR SLVR 14FR STAT (SET/KITS/TRAYS/PACK) IMPLANT
TRAY FOLEY MTR SLVR 16FR STAT (SET/KITS/TRAYS/PACK) IMPLANT

## 2024-04-01 NOTE — Op Note (Signed)
 04/01/2024  10:39 AM  PATIENT:  Dominique Mooney  73 y.o. female  Patient Care Team: Emerick Avelina MARLA DEVONNA as PCP - General (Physician Assistant) [name], MD as PCP - Cardiology (Cardiology)  PRE-OPERATIVE DIAGNOSIS: Small bowel obstruction  POST-OPERATIVE DIAGNOSIS:  Same  PROCEDURE:   1.  Exploratory laparotomy with small bowel resection 2.  Lysis of adhesions x 80 minutes 3.  Enterorrhaphy (repair of small bowel serosa)  SURGEON:  Lonni HERO. Natanel Snavely, MD  ASSISTANT: OR Staff  ANESTHESIA:   general  COUNTS:  Sponge, needle and instrument counts were reported correct x2 at the conclusion of the operation.  EBL: 20 mL  DRAINS: none  SPECIMEN: Segment of ileum  COMPLICATIONS: none  FINDINGS: Adhesions to abdominal wall related to prior surgery in the low midline.  Additionally, pelvic adhesions containing small bowel related prior surgery.  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  The stricturing segment does appear quite chronic and fairly dense.  Bowel is viable but given the evident stricturing at this location, we opted to resect the segment of small bowel.  1 small deserosalization noted from a dense matted adhesion which was repaired.  This was inherent to the nature of the procedure.  No other significant adhesions noted at conclusion of procedure.  DISPOSITION: PACU in satisfactory condition   DESCRIPTION:  The patient was seen in the pre-op holding area. The risks, benefits, complications, treatment options, and expected outcomes were previously discussed with the patient. The patient agreed with the proposed plan and has signed the informed consent form. The patient was brought to the operating room by the surgical team, identified as Dominique Mooney, and the procedure verified. placed supine on the operating table and SCD's were applied. General anesthesia was induced without  difficulty. She was positioned supine.  Pressure points were evaluated and padded.  A foley catheter was then placed by nursing under sterile conditions. Hair on the abdomen was clipped.  She was secured to the operating table. The abdomen was then prepped and draped in the standard sterile fashion. A time out was completed and the above information confirmed and need for preoperative antibiotics.  Prior low midline laparotomy scar marked.  We made a lower midline incision going just cephalad to the umbilicus.  Subcutaneous tissue divided electrocautery.  The fascia was opened superiorly umbilicus where she has not had prior surgery.  We were able to into the peritoneal cavity carefully.  The incision is then widened.  We did encounter fairly dense adhesions in the low midline and additionally a piece of metal wire was encountered in her fascia.  This was removed.  Fascia is elevated.  Anterior midline adhesions are carefully taken down sharply using Metzenbaum scissors.  There was 1 fairly tenacious piece of band involving a loop of bowel and in taking this down, there was an approximate 1 cm deserosalization that occurred.  This was inherent to the nature of the procedure.  This was repaired in a Lembert fashion using 3-0 silk suture.  After taking down the midline adhesions, we began to eviscerate small bowel.  Interloop adhesions were encountered and lysed sharply.  We then encountered fairly dense adhesions in the pelvis related to her prior surgery and these adhesions were also lysed.  These consist of small bowel containing adhesions to much of the pelvic sidewall.  This was done carefully.  We did not enter the retroperitoneum.  There was  1 fairly dense adhesive band overlying a loop of small bowel that was causing significant narrowing and appeared to be the clear point of obstruction.  This band was released.  The bowel was inspected and viable.  This adhesion appears to be quite chronic and has been  laying on top of this for some time.  She has developed luminal narrowing/stenosis of the segment.  Although viable, was concerned that this would result in a potential point for subsequent obstructions in the future.  We therefore opted to proceed with a small bowel resection.  An Alexis wound protector was placed.  Towels were placed on the field.  Windows are created the mesentery on either side of the strictured segment of ileum.  This is approximately 20 cm proximal to the ileocecal valve.  The small bowel was then divided using a GIA blue load stapler on each respective side.  The intervening mesentery is ligated and divided staying close to the bowel using the hand-held LigaSure device.  The segment of ileum was passed off the specimen.  Attention is turned to creating a small bowel anastomosis.  The mesenteric corner of each respective staple line is cut.  A GIA 75 blue load stapler was then used to create a enteroenterostomy on the antimesenteric edge.  The stapler was closed, held, fired.  The common enterotomy is then elevated with Allis clamps and closed using a TA 60 blue load stapler.  The corners of the TA 60 staple line are then subsequently dunked using 2-0 silk suture.  Contents were milked to and fro.  Staples are well-formed and there are no gaps or evident extravasation of contents.  A 3-0 silk sutures placed at the apex of the anastomosis.  The mesenteric defect was then closed using interrupted 3-0 silk suture.  The entire small bowel was then run again.  No evident injuries or other concerns were noted.  There are no significant adhesions remaining either.  The abdomen is then irrigated.  Hemostasis is verified.  NG tube is within the stomach.  The wound protector was removed.  The midline fascia is then closed using 2 running #1 PDS sutures.  The wound is washed/irrigated.  All sponge, needle, and instrument counts are reported correct.  The skin is then approximated using skin  staples.  She is then awakened from anesthesia, extubated, and transferred to a stretcher for transport to recovery in satisfactory condition.

## 2024-04-01 NOTE — Anesthesia Postprocedure Evaluation (Signed)
 Anesthesia Post Note  Patient: Dominique Mooney  Procedure(s) Performed: LAPAROTOMY, EXPLORATORY (Abdomen) LAPAROTOMY, FOR LYSIS OF ADHESIONS (Abdomen) EXCISION, SMALL INTESTINE (Abdomen)     Patient location during evaluation: PACU Anesthesia Type: General Level of consciousness: awake Pain management: pain level controlled Vital Signs Assessment: post-procedure vital signs reviewed and stable Respiratory status: spontaneous breathing and nonlabored ventilation Cardiovascular status: blood pressure returned to baseline Postop Assessment: no apparent nausea or vomiting Anesthetic complications: no   No notable events documented.  Last Vitals:  Vitals:   04/01/24 1200 04/01/24 1233  BP: (!) 145/85 (!) 136/90  Pulse: 91 97  Resp: 11 18  Temp: 36.4 C   SpO2: 93% 96%    Last Pain:  Vitals:   04/01/24 1200  TempSrc:   PainSc: Asleep                 Lauraine KATHEE [place]

## 2024-04-01 NOTE — Progress Notes (Signed)
 PROGRESS NOTE    Dominique Mooney  FMW:[id number] DOB: 1950/09/18 DOA: 03/27/2024 PCP: Emerick Avelina POUR, PA-C    Brief Narrative:  73 year old with history of nonobstructive coronary artery disease, CKD stage IIIa, hypertension hyperlipidemia presented with nausea vomiting and abdominal pain.  Found to have small bowel obstruction.  Treated conservatively but failed. 11/13, underwent ex lap with bowel resection.  Subjective: Patient seen and examined.  Came back from procedure.  Denies any complaints.  Daughter at the bedside. Assessment & Plan:   Complete small bowel obstruction: Failed conservative management with NG, NG tube drainage. Ex lap with lysis of adhesion and small bowel resection and anastomosis 11/13 N.p.o., IV fluids, continue adequate pain medications.  Mobilize.  NG tube drainage.  Postop management as per surgery.  Currently repairment: Fairly stable.  From home.  Hypothyroidism: On Synthroid .  Currently on IV Synthroid .  TSH 7.7.  Hypertension: Blood pressure stable.  CKD stage III: Stable.    DVT prophylaxis: SCDs Start: 03/27/24 2136   Code Status: Full code Family Communication: Daughter at the bedside Disposition Plan: Status is: Inpatient Remains inpatient appropriate because: Immediate.     Consultants:  General surgery  Procedures:  Ex lap and bowel repair  Antimicrobials:  Perioperative     Objective: Vitals:   04/01/24 1233 04/01/24 1329 04/01/24 1425 04/01/24 1520  BP: (!) 136/90 132/88 (!) 140/90 137/84  Pulse: 97 82 (!) 102 95  Resp: 18 18 18 18   Temp:  98.4 F (36.9 C) 98.6 F (37 C) 98.6 F (37 C)  TempSrc:  Oral Oral Oral  SpO2: 96% 100% 97%   Weight:      Height:        Intake/Output Summary (Last 24 hours) at 04/01/2024 1626 Last data filed at 04/01/2024 1400 Gross per 24 hour  Intake 2575 ml  Output 1700 ml  Net 875 ml   Filed Weights   03/27/24 1809 03/27/24 1815 03/31/24 1543  Weight: 52.2 kg 52.2 kg 52.2  kg    Examination:  General: Looks fairly comfortable. Cardiovascular: S1-S2 normal. Respiratory: On room air.  Bilateral clear. Gastrointestinal: Postop.  Tenderness present.  NG tube with biliary drain.     Data Reviewed: I have personally reviewed following labs and imaging studies  CBC: Recent Labs  Lab 03/27/24 1833 03/28/24 0518 03/29/24 0508 03/30/24 0540 03/31/24 0516 04/01/24 0521  WBC 12.4* 11.9* 9.6 8.9 7.0 8.9  NEUTROABS 10.7*  --   --   --   --   --   HGB 13.8 12.4 12.7 12.1 13.0 13.3  HCT 42.6 38.6 38.4 35.7* 38.1 37.9  MCV 92.4 94.8 94.3 92.2 90.3 89.2  PLT 260 214 211 194 225 235   Basic Metabolic Panel: Recent Labs  Lab 03/28/24 0518 03/29/24 0508 03/30/24 0540 03/31/24 0516 04/01/24 0521  NA 135 139 135 133* 132*  K 4.1 4.2 3.8 3.7 3.6  CL 104 103 99 94* 92*  CO2 23 29 26 27 25   GLUCOSE 119* 94 84 82 77  BUN 10 11 12 11 11   CREATININE 0.87 0.88 0.78 0.72 0.68  CALCIUM  8.6* 9.1 8.9 9.3 9.1  MG  --  2.1 1.9 2.0 2.0  PHOS  --  3.0 2.4* 2.8 2.6   GFR: Estimated Creatinine Clearance: 48.7 mL/min (by C-G formula based on SCr of 0.68 mg/dL). Liver Function Tests: Recent Labs  Lab 03/27/24 1833 03/29/24 0508 03/30/24 0540 03/31/24 0516 04/01/24 0521  AST 38  --   --   --   --  ALT <5  --   --   --   --   ALKPHOS 73  --   --   --   --   BILITOT 0.8  --   --   --   --   PROT 7.7  --   --   --   --   ALBUMIN 4.3 3.6 3.6 3.9 3.7   Recent Labs  Lab 03/27/24 1833  LIPASE 36   No results for input(s): AMMONIA in the last 168 hours. Coagulation Profile: No results for input(s): INR, PROTIME in the last 168 hours. Cardiac Enzymes: No results for input(s): CKTOTAL, CKMB, CKMBINDEX, TROPONINI in the last 168 hours. BNP (last 3 results) No results for input(s): PROBNP in the last 8760 hours. HbA1C: No results for input(s): HGBA1C in the last 72 hours. CBG: No results for input(s): GLUCAP in the last 168 hours. Lipid  Profile: No results for input(s): CHOL, HDL, LDLCALC, TRIG, CHOLHDL, LDLDIRECT in the last 72 hours. Thyroid  Function Tests: No results for input(s): TSH, T4TOTAL, FREET4, T3FREE, THYROIDAB in the last 72 hours. Anemia Panel: No results for input(s): VITAMINB12, FOLATE, FERRITIN, TIBC, IRON, RETICCTPCT in the last 72 hours. Sepsis Labs: No results for input(s): PROCALCITON, LATICACIDVEN in the last 168 hours.  No results found for this or any previous visit (from the past 240 hours).       Radiology Studies: DG Abd 1 View Result Date: 04/01/2024 EXAM: 1 VIEW XRAY OF THE ABDOMEN 04/01/2024 07:03:00 AM COMPARISON: KUB dated 03/30/2024. CLINICAL HISTORY: [id number] Nasogastric tube present [id number] Nasogastric tube present FINDINGS: LINES, TUBES AND DEVICES: An enteric catheter is again demonstrated within the body of the stomach. BOWEL: Persistent mild-to-moderate distention of the proximal to mid small bowel. There is no definite air evident within the colon. The findings are again suggestive of small bowel obstruction. SOFT TISSUES: Surgical clips are noted within the gallbladder fossa. No opaque urinary calculi. BONES: No acute osseous abnormality. IMPRESSION: 1. Persistent mild-to-moderate distention of the proximal to mid small bowel, suggestive of small bowel obstruction. No definite air evident within the colon. Electronically signed by: Evalene Coho MD 04/01/2024 07:09 AM EST RP Workstation: [id number]        Scheduled Meds:  [START ON 04/02/2024] levothyroxine   50 mcg Intravenous Daily   Continuous Infusions:  lactated ringers  125 mL/hr at 04/01/24 0156     LOS: 5 days    Time spent: 35 minutes    Renato Applebaum, MD [facility]

## 2024-04-01 NOTE — Progress Notes (Signed)
 Patient given CHG bath by NT, NG Tube slid out over 20cm, resulting in marking at 47cm. NG Tube replaced/slid back down and X-ray to confirm placement

## 2024-04-01 NOTE — Progress Notes (Addendum)
 * Day of Surgery *  Subjective: No bowel function.  NGT with almost 1L out.  Denies abdominal pain  ROS: See above, otherwise other systems negative  Objective: Vital signs in last 24 hours: Temp:  [98.6 F (37 C)-100.5 F (38.1 C)] 99.2 F (37.3 C) (11/13 0729) Pulse Rate:  [92-106] 101 (11/13 0729) Resp:  [15-16] 16 (11/13 0729) BP: (144-162)/(89-98) 144/89 (11/13 0729) SpO2:  [96 %-100 %] 97 % (11/13 0729) Weight:  [52.2 kg] 52.2 kg (11/12 1543) Last BM Date : 03/26/24  Intake/Output from previous day: 11/12 0701 - 11/13 0700 In: 1949.4 [I.V.:1949.4] Out: 1450 [Urine:500; Emesis/NG output:950] Intake/Output this shift: No intake/output data recorded.  PE: Gen: NAD HEENT: NGT in place with copious bilious output Abd: soft, NT, ND  Lab Results:  Recent Labs    03/31/24 0516 04/01/24 0521  WBC 7.0 8.9  HGB 13.0 13.3  HCT 38.1 37.9  PLT 225 235   BMET Recent Labs    03/31/24 0516 04/01/24 0521  NA 133* 132*  K 3.7 3.6  CL 94* 92*  CO2 27 25  GLUCOSE 82 77  BUN 11 11  CREATININE 0.72 0.68  CALCIUM  9.3 9.1   PT/INR No results for input(s): LABPROT, INR in the last 72 hours. CMP     Component Value Date/Time   NA 132 (L) 04/01/2024 0521   NA 142 08/09/2022 0913   K 3.6 04/01/2024 0521   CL 92 (L) 04/01/2024 0521   CO2 25 04/01/2024 0521   GLUCOSE 77 04/01/2024 0521   BUN 11 04/01/2024 0521   BUN 9 08/09/2022 0913   CREATININE 0.68 04/01/2024 0521   CALCIUM  9.1 04/01/2024 0521   PROT 7.7 03/27/2024 1833   PROT 6.6 08/09/2022 0913   ALBUMIN 3.7 04/01/2024 0521   ALBUMIN 4.0 08/09/2022 0913   AST 38 03/27/2024 1833   ALT <5 03/27/2024 1833   ALKPHOS 73 03/27/2024 1833   BILITOT 0.8 03/27/2024 1833   BILITOT 0.7 08/09/2022 0913   GFRNONAA >60 04/01/2024 0521   GFRAA >60 12/31/2019 1156   Lipase     Component Value Date/Time   LIPASE 36 03/27/2024 1833       Studies/Results: DG Abd 1 View Result Date: 04/01/2024 EXAM: 1  VIEW XRAY OF THE ABDOMEN 04/01/2024 07:03:00 AM COMPARISON: KUB dated 03/30/2024. CLINICAL HISTORY: [id number] Nasogastric tube present [id number] Nasogastric tube present FINDINGS: LINES, TUBES AND DEVICES: An enteric catheter is again demonstrated within the body of the stomach. BOWEL: Persistent mild-to-moderate distention of the proximal to mid small bowel. There is no definite air evident within the colon. The findings are again suggestive of small bowel obstruction. SOFT TISSUES: Surgical clips are noted within the gallbladder fossa. No opaque urinary calculi. BONES: No acute osseous abnormality. IMPRESSION: 1. Persistent mild-to-moderate distention of the proximal to mid small bowel, suggestive of small bowel obstruction. No definite air evident within the colon. Electronically signed by: Evalene Coho MD 04/01/2024 07:09 AM EST RP Workstation: [id number]    Anti-infectives: Anti-infectives (From admission, onward)    Start     Dose/Rate Route Frequency Ordered Stop   04/01/24 0800  cefTRIAXone (ROCEPHIN) 2 g in sodium chloride  0.9 % 100 mL IVPB  Status:  Discontinued        2 g 200 mL/hr over 30 Minutes Intravenous On call to O.R. 04/01/24 0700 04/01/24 0731   04/01/24 0730  cefoTEtan (CEFOTAN) 2 g in sodium chloride  0.9 % 100 mL IVPB  2 g 200 mL/hr over 30 Minutes Intravenous On call to O.R. 04/01/24 0640 04/02/24 0559   03/31/24 1300  cefoTEtan (CEFOTAN) 2 g in sodium chloride  0.9 % 100 mL IVPB  Status:  Discontinued        2 g 200 mL/hr over 30 Minutes Intravenous On call to O.R. 03/31/24 0909 03/31/24 1601   03/31/24 0945  cefTRIAXone (ROCEPHIN) 2 g in sodium chloride  0.9 % 100 mL IVPB  Status:  Discontinued        2 g 200 mL/hr over 30 Minutes Intravenous On call to O.R. 03/31/24 9145 03/31/24 0909        Assessment/Plan SBO -remains obstructed with high NGT output and no bowel function -at this point the patient has failed conservative management and we recommend  proceeding to the OR for laparotomy today  -The anatomy and physiology of the GI tract was discussed with her and her daughter. The pathophysiology of bowel obstruction was discussed as well. -We have discussed surgery, exploratory laparotomy, lysis of adhesions, possible bowel resection  -The planned procedure, material risks (including, but not limited to, pain, bleeding, infection, scarring, need for blood transfusion, damage to surrounding structures- blood vessels/nerves/viscus/organs, damage to ureter, urine leak, leak from anastomosis, need for additional procedures, blood clot, pulmonary embolus, worsening of pre-existing medical conditions, chronic diarrhea, constipation secondary to narcotic use, hernia, recurrence, pneumonia, heart attack, stroke, death) benefits and alternatives to surgery were discussed at length. The patient's questions were answered to her and her daughter's satisfaction, they voiced understanding and elected to proceed with surgery. Additionally, we discussed typical postoperative expectations and the recovery process.  -Rocephin on call to OR -cont NGT/NPO/IVFs  -labs look good this am -d/w primary service  FEN - NPO/NGT/IVFs VTE - ok for chemical prophylaxis from our standpoint, SCDs ID - rocephin on call to OR  I spent a total of 50 minutes today in both face-to-face and non-face-to-face activities to perform the following: review records, take and update history, examine the patient, counsel the patient on the diagnosis, and document encounter, findings, and plan in the EHR  for this visit on the date of this encounter.   I reviewed hospitalist notes, last 24 h vitals and pain scores, last 48 h intake and output, last 24 h labs and trends, and last 24 h imaging results.   LOS: 5 days     Lonni Pizza, MD [facility]

## 2024-04-01 NOTE — Transfer of Care (Signed)
 Immediate Anesthesia Transfer of Care Note  Patient: Dominique Mooney  Procedure(s) Performed: LAPAROTOMY, EXPLORATORY (Abdomen) LAPAROTOMY, FOR LYSIS OF ADHESIONS (Abdomen) EXCISION, SMALL INTESTINE (Abdomen)  Patient Location: PACU  Anesthesia Type:General  Level of Consciousness: drowsy and patient cooperative  Airway & Oxygen Therapy: Patient Spontanous Breathing and Patient connected to face mask oxygen  Post-op Assessment: Report given to RN and Post -op Vital signs reviewed and stable  Post vital signs: Reviewed and stable  Last Vitals:  Vitals Value Taken Time  BP 143/74 04/01/24 10:54  Temp    Pulse 83 04/01/24 10:56  Resp 14 04/01/24 10:56  SpO2 100 % 04/01/24 10:56  Vitals shown include unfiled device data.  Last Pain:  Vitals:   04/01/24 0729  TempSrc: Oral  PainSc: 0-No pain         Complications: No notable events documented.

## 2024-04-01 NOTE — Anesthesia Procedure Notes (Signed)
 Procedure Name: Intubation Date/Time: 04/01/2024 9:30 AM  Performed by: Vincenzo Show, CRNAPre-anesthesia Checklist: Patient identified, Emergency Drugs available, Suction available, Patient being monitored and Timeout performed Patient Re-evaluated:Patient Re-evaluated prior to induction Oxygen Delivery Method: Circle system utilized Preoxygenation: Pre-oxygenation with 100% oxygen Induction Type: IV induction Ventilation: Mask ventilation without difficulty Laryngoscope Size: Mac and 3 Grade View: Grade I Tube type: Oral Tube size: 7.0 mm Number of attempts: 1 Airway Equipment and Method: Stylet Placement Confirmation: ETT inserted through vocal cords under direct vision, positive ETCO2, CO2 detector and breath sounds checked- equal and bilateral Secured at: 21 cm Tube secured with: Tape Dental Injury: Teeth and Oropharynx as per pre-operative assessment  Comments: ATOI

## 2024-04-02 ENCOUNTER — Encounter (HOSPITAL_COMMUNITY): Payer: Self-pay | Admitting: Surgery

## 2024-04-02 ENCOUNTER — Telehealth: Payer: Self-pay | Admitting: Physician Assistant

## 2024-04-02 DIAGNOSIS — K56609 Unspecified intestinal obstruction, unspecified as to partial versus complete obstruction: Secondary | ICD-10-CM | POA: Diagnosis not present

## 2024-04-02 MED ORDER — ENOXAPARIN SODIUM 40 MG/0.4ML IJ SOSY: 40.0000 mg | PREFILLED_SYRINGE | INTRAMUSCULAR | Status: DC

## 2024-04-02 MED ORDER — ACETAMINOPHEN 10 MG/ML IV SOLN
1000.0000 mg | Freq: Four times a day (QID) | INTRAVENOUS | Status: AC
  Administered 2024-04-02 – 2024-04-03 (×4): 1000 mg via INTRAVENOUS
  Filled 2024-04-02 (×4): qty 100

## 2024-04-02 MED ORDER — ENOXAPARIN SODIUM 30 MG/0.3ML IJ SOSY
30.0000 mg | PREFILLED_SYRINGE | INTRAMUSCULAR | Status: DC
  Administered 2024-04-02 – 2024-04-05 (×4): 30 mg via SUBCUTANEOUS
  Filled 2024-04-02 (×4): qty 0.3

## 2024-04-02 MED ORDER — METHOCARBAMOL 1000 MG/10ML IJ SOLN
500.0000 mg | Freq: Three times a day (TID) | INTRAMUSCULAR | Status: DC | PRN
  Administered 2024-04-04 – 2024-04-06 (×2): 500 mg via INTRAVENOUS
  Filled 2024-04-02 (×2): qty 10

## 2024-04-02 NOTE — Progress Notes (Signed)
 PROGRESS NOTE    Dominique Mooney  FMW:[id number] DOB: 07-20-1950 DOA: 03/27/2024 PCP: Emerick Avelina POUR, PA-C    Brief Narrative:  73 year old with history of nonobstructive coronary artery disease, CKD stage IIIa, hypertension hyperlipidemia presented with nausea vomiting and abdominal pain.  Found to have small bowel obstruction.  Treated conservatively but failed. 11/13, underwent ex lap with bowel resection.  Subjective: Patient seen and examined.  Patient is pleasantly confused.  She denies any complaints.  She does not know she had surgery.  Assessment & Plan:   Complete small bowel obstruction: Failed conservative management with n.p.o. , NG tube drainage. Ex lap with lysis of adhesion and small bowel resection and anastomosis 11/13 N.p.o., IV fluids, continue adequate pain medications.  Mobilize.  NG tube drainage.  Postop management as per surgery.  Cognitive impairment: Fairly stable.  From home.  Hypothyroidism: On Synthroid .  Currently on IV Synthroid .  TSH 7.7.  Hypertension: Blood pressure stable.  CKD stage III: Stable.  Recheck labs tomorrow.    DVT prophylaxis: SCDs Start: 03/27/24 2136   Code Status: Full code Family Communication: None today. Disposition Plan: Status is: Inpatient Remains inpatient appropriate because: Immediate after surgery.     Consultants:  General surgery  Procedures:  Ex lap and bowel repair  Antimicrobials:  Perioperative     Objective: Vitals:   04/01/24 2111 04/02/24 0216 04/02/24 0605 04/02/24 1027  BP: 107/85 117/78 120/80 126/74  Pulse: (!) 110 (!) 108 (!) 108 88  Resp: 16 16 16 18   Temp: 98.7 F (37.1 C) 98.9 F (37.2 C) 98.7 F (37.1 C) 98.6 F (37 C)  TempSrc: Oral Oral Oral Oral  SpO2: 97% 100% 98% 99%  Weight:      Height:        Intake/Output Summary (Last 24 hours) at 04/02/2024 1055 Last data filed at 04/02/2024 1000 Gross per 24 hour  Intake 3060 ml  Output 1500 ml  Net 1560 ml   Filed  Weights   03/27/24 1809 03/27/24 1815 03/31/24 1543  Weight: 52.2 kg 52.2 kg 52.2 kg    Examination:  General: Looks fairly comfortable.  Pleasant but interactive.  Confused. Cardiovascular: S1-S2 normal. Respiratory: On room air.  Bilateral clear. Gastrointestinal: Postop.  Tenderness present.  NG tube with biliary drain.  Bowel sounds absent.     Data Reviewed: I have personally reviewed following labs and imaging studies  CBC: Recent Labs  Lab 03/27/24 1833 03/28/24 0518 03/29/24 0508 03/30/24 0540 03/31/24 0516 04/01/24 0521  WBC 12.4* 11.9* 9.6 8.9 7.0 8.9  NEUTROABS 10.7*  --   --   --   --   --   HGB 13.8 12.4 12.7 12.1 13.0 13.3  HCT 42.6 38.6 38.4 35.7* 38.1 37.9  MCV 92.4 94.8 94.3 92.2 90.3 89.2  PLT 260 214 211 194 225 235   Basic Metabolic Panel: Recent Labs  Lab 03/28/24 0518 03/29/24 0508 03/30/24 0540 03/31/24 0516 04/01/24 0521  NA 135 139 135 133* 132*  K 4.1 4.2 3.8 3.7 3.6  CL 104 103 99 94* 92*  CO2 23 29 26 27 25   GLUCOSE 119* 94 84 82 77  BUN 10 11 12 11 11   CREATININE 0.87 0.88 0.78 0.72 0.68  CALCIUM  8.6* 9.1 8.9 9.3 9.1  MG  --  2.1 1.9 2.0 2.0  PHOS  --  3.0 2.4* 2.8 2.6   GFR: Estimated Creatinine Clearance: 48.7 mL/min (by C-G formula based on SCr of 0.68 mg/dL). Liver Function  Tests: Recent Labs  Lab 03/27/24 1833 03/29/24 0508 03/30/24 0540 03/31/24 0516 04/01/24 0521  AST 38  --   --   --   --   ALT <5  --   --   --   --   ALKPHOS 73  --   --   --   --   BILITOT 0.8  --   --   --   --   PROT 7.7  --   --   --   --   ALBUMIN 4.3 3.6 3.6 3.9 3.7   Recent Labs  Lab 03/27/24 1833  LIPASE 36   No results for input(s): AMMONIA in the last 168 hours. Coagulation Profile: No results for input(s): INR, PROTIME in the last 168 hours. Cardiac Enzymes: No results for input(s): CKTOTAL, CKMB, CKMBINDEX, TROPONINI in the last 168 hours. BNP (last 3 results) No results for input(s): PROBNP in the last  8760 hours. HbA1C: No results for input(s): HGBA1C in the last 72 hours. CBG: No results for input(s): GLUCAP in the last 168 hours. Lipid Profile: No results for input(s): CHOL, HDL, LDLCALC, TRIG, CHOLHDL, LDLDIRECT in the last 72 hours. Thyroid  Function Tests: No results for input(s): TSH, T4TOTAL, FREET4, T3FREE, THYROIDAB in the last 72 hours. Anemia Panel: No results for input(s): VITAMINB12, FOLATE, FERRITIN, TIBC, IRON, RETICCTPCT in the last 72 hours. Sepsis Labs: No results for input(s): PROCALCITON, LATICACIDVEN in the last 168 hours.  No results found for this or any previous visit (from the past 240 hours).       Radiology Studies: DG Abd 1 View Result Date: 04/01/2024 EXAM: 1 VIEW XRAY OF THE ABDOMEN 04/01/2024 07:03:00 AM COMPARISON: KUB dated 03/30/2024. CLINICAL HISTORY: [id number] Nasogastric tube present [id number] Nasogastric tube present FINDINGS: LINES, TUBES AND DEVICES: An enteric catheter is again demonstrated within the body of the stomach. BOWEL: Persistent mild-to-moderate distention of the proximal to mid small bowel. There is no definite air evident within the colon. The findings are again suggestive of small bowel obstruction. SOFT TISSUES: Surgical clips are noted within the gallbladder fossa. No opaque urinary calculi. BONES: No acute osseous abnormality. IMPRESSION: 1. Persistent mild-to-moderate distention of the proximal to mid small bowel, suggestive of small bowel obstruction. No definite air evident within the colon. Electronically signed by: Evalene Coho MD 04/01/2024 07:09 AM EST RP Workstation: [id number]        Scheduled Meds:  levothyroxine   50 mcg Intravenous Daily   Continuous Infusions:  acetaminophen      lactated ringers  125 mL/hr at 04/01/24 1718     LOS: 6 days    Time spent: 35 minutes    Renato Applebaum, MD [facility]

## 2024-04-02 NOTE — Plan of Care (Signed)
  Problem: Education: Goal: Knowledge of General Education information will improve Description: Including pain rating scale, medication(s)/side effects and non-pharmacologic comfort measures 04/02/2024 1625 by Haig Damien HERO, RN Outcome: Progressing 04/02/2024 1625 by Haig Damien HERO, RN Outcome: Progressing   Problem: Health Behavior/Discharge Planning: Goal: Ability to manage health-related needs will improve 04/02/2024 1625 by Haig Damien HERO, RN Outcome: Progressing 04/02/2024 1625 by Haig Damien HERO, RN Outcome: Progressing   Problem: Clinical Measurements: Goal: Ability to maintain clinical measurements within normal limits will improve 04/02/2024 1625 by Haig Damien HERO, RN Outcome: Progressing 04/02/2024 1625 by Haig Damien HERO, RN Outcome: Progressing Goal: Will remain free from infection 04/02/2024 1625 by Haig Damien HERO, RN Outcome: Progressing 04/02/2024 1625 by Haig Damien HERO, RN Outcome: Progressing Goal: Diagnostic test results will improve 04/02/2024 1625 by Haig Damien HERO, RN Outcome: Progressing 04/02/2024 1625 by Haig Damien HERO, RN Outcome: Progressing Goal: Respiratory complications will improve 04/02/2024 1625 by Haig Damien HERO, RN Outcome: Progressing 04/02/2024 1625 by Haig Damien HERO, RN Outcome: Progressing Goal: Cardiovascular complication will be avoided 04/02/2024 1625 by Haig Damien HERO, RN Outcome: Progressing 04/02/2024 1625 by Haig Damien HERO, RN Outcome: Progressing   Problem: Activity: Goal: Risk for activity intolerance will decrease 04/02/2024 1625 by Haig Damien HERO, RN Outcome: Progressing 04/02/2024 1625 by Haig Damien HERO, RN Outcome: Progressing   Problem: Nutrition: Goal: Adequate nutrition will be maintained 04/02/2024 1625 by Haig Damien HERO, RN Outcome: Progressing 04/02/2024 1625 by Haig Damien HERO, RN Outcome: Progressing   Problem: Coping: Goal: Level of anxiety will decrease 04/02/2024  1625 by Haig Damien HERO, RN Outcome: Progressing 04/02/2024 1625 by Haig Damien HERO, RN Outcome: Progressing   Problem: Elimination: Goal: Will not experience complications related to bowel motility 04/02/2024 1625 by Haig Damien HERO, RN Outcome: Progressing 04/02/2024 1625 by Haig Damien HERO, RN Outcome: Progressing Goal: Will not experience complications related to urinary retention 04/02/2024 1625 by Haig Damien HERO, RN Outcome: Progressing 04/02/2024 1625 by Haig Damien HERO, RN Outcome: Progressing   Problem: Pain Managment: Goal: General experience of comfort will improve and/or be controlled 04/02/2024 1625 by Haig Damien HERO, RN Outcome: Progressing 04/02/2024 1625 by Haig Damien HERO, RN Outcome: Progressing   Problem: Safety: Goal: Ability to remain free from injury will improve 04/02/2024 1625 by Haig Damien HERO, RN Outcome: Progressing 04/02/2024 1625 by Haig Damien HERO, RN Outcome: Progressing   Problem: Skin Integrity: Goal: Risk for impaired skin integrity will decrease 04/02/2024 1625 by Haig Damien HERO, RN Outcome: Progressing 04/02/2024 1625 by Haig Damien HERO, RN Outcome: Progressing

## 2024-04-02 NOTE — TOC Progression Note (Signed)
 Transition of Care [facility]) - Progression Note    Patient Details  Name: Dominique Mooney MRN: [id number] Date of Birth: 1950/11/25  [facility]) CM/SW Contact  NORMAN ASPEN, LCSW Phone Number: 04/02/2024, 3:11 PM  Clinical Narrative:      Alerted by RN that pt's daughter requesting to speak with me about dc planning issues.  Met with daughter, [name], who explains that family is trying to get patient placed in local ALF due to pt's continuing cognitive decline.  She, also, notes that pt remains very defiant and refuses to consider move and insists she has no cognitive issues.  Daughter asking if we could get her mother placed in ALF from the hospital.  We discussed and I have explained that this is not something we can do from here unless bed is firmly secured at an ALF and admission to ALF was imminent.  Daughter understands and reports she has been working with [facility] and is narrowing down the facility choices but still battling mother's resistance.  Daughter understands that plan would be dc home with family continuing to work with agency on securing ALF.                     Expected Discharge Plan and Services                                               Social Drivers of Health (SDOH) Interventions SDOH Screenings   Food Insecurity: No Food Insecurity (03/27/2024)  Housing: High Risk (03/27/2024)  Transportation Needs: No Transportation Needs (03/27/2024)  Utilities: Not At Risk (03/27/2024)  Social Connections: Unknown (03/27/2024)  Tobacco Use: Low Risk  (04/01/2024)    Readmission Risk Interventions    03/30/2024    4:16 PM  Readmission Risk Prevention Plan  Post Dischage Appt Complete  Medication Screening Complete  Transportation Screening Complete

## 2024-04-02 NOTE — Telephone Encounter (Signed)
 I advised POA to talk to hospitalist unsure if they would do this. Otherwise do MRI after hospital visit. She thanked me for calling.

## 2024-04-02 NOTE — Progress Notes (Signed)
 1 Day Post-Op  Subjective: No bowel function.  Sitting up in her chair and going for a walk with mobility specialist.  Daughter at bedside. Pain well controlled  Objective: Vital signs in last 24 hours: Temp:  [97.4 F (36.3 C)-98.9 F (37.2 C)] 98.6 F (37 C) (11/14 1027) Pulse Rate:  [82-110] 88 (11/14 1027) Resp:  [11-18] 18 (11/14 1027) BP: (107-145)/(74-90) 126/74 (11/14 1027) SpO2:  [91 %-100 %] 99 % (11/14 1027) Last BM Date : 03/26/24  Intake/Output from previous day: 11/13 0701 - 11/14 0700 In: 4200 [I.V.:4200] Out: 1750 [Urine:1400; Emesis/NG output:300; Blood:50] Intake/Output this shift: Total I/O In: 60 [NG/GT:60] Out: 300 [Urine:200; Emesis/NG output:100]  PE: Gen: NAD HEENT: NGT in place with bilious output Abd: soft, appropriately tender, ND, incision c/d/I with staples and honeycomb dressing  Lab Results:  Recent Labs    03/31/24 0516 04/01/24 0521  WBC 7.0 8.9  HGB 13.0 13.3  HCT 38.1 37.9  PLT 225 235   BMET Recent Labs    03/31/24 0516 04/01/24 0521  NA 133* 132*  K 3.7 3.6  CL 94* 92*  CO2 27 25  GLUCOSE 82 77  BUN 11 11  CREATININE 0.72 0.68  CALCIUM  9.3 9.1   PT/INR No results for input(s): LABPROT, INR in the last 72 hours. CMP     Component Value Date/Time   NA 132 (L) 04/01/2024 0521   NA 142 08/09/2022 0913   K 3.6 04/01/2024 0521   CL 92 (L) 04/01/2024 0521   CO2 25 04/01/2024 0521   GLUCOSE 77 04/01/2024 0521   BUN 11 04/01/2024 0521   BUN 9 08/09/2022 0913   CREATININE 0.68 04/01/2024 0521   CALCIUM  9.1 04/01/2024 0521   PROT 7.7 03/27/2024 1833   PROT 6.6 08/09/2022 0913   ALBUMIN 3.7 04/01/2024 0521   ALBUMIN 4.0 08/09/2022 0913   AST 38 03/27/2024 1833   ALT <5 03/27/2024 1833   ALKPHOS 73 03/27/2024 1833   BILITOT 0.8 03/27/2024 1833   BILITOT 0.7 08/09/2022 0913   GFRNONAA >60 04/01/2024 0521   GFRAA >60 12/31/2019 1156   Lipase     Component Value Date/Time   LIPASE 36 03/27/2024 1833        Studies/Results: DG Abd 1 View Result Date: 04/01/2024 EXAM: 1 VIEW XRAY OF THE ABDOMEN 04/01/2024 07:03:00 AM COMPARISON: KUB dated 03/30/2024. CLINICAL HISTORY: [id number] Nasogastric tube present [id number] Nasogastric tube present FINDINGS: LINES, TUBES AND DEVICES: An enteric catheter is again demonstrated within the body of the stomach. BOWEL: Persistent mild-to-moderate distention of the proximal to mid small bowel. There is no definite air evident within the colon. The findings are again suggestive of small bowel obstruction. SOFT TISSUES: Surgical clips are noted within the gallbladder fossa. No opaque urinary calculi. BONES: No acute osseous abnormality. IMPRESSION: 1. Persistent mild-to-moderate distention of the proximal to mid small bowel, suggestive of small bowel obstruction. No definite air evident within the colon. Electronically signed by: Evalene Coho MD 04/01/2024 07:09 AM EST RP Workstation: [id number]    Anti-infectives: Anti-infectives (From admission, onward)    Start     Dose/Rate Route Frequency Ordered Stop   04/01/24 0800  cefTRIAXone (ROCEPHIN) 2 g in sodium chloride  0.9 % 100 mL IVPB  Status:  Discontinued        2 g 200 mL/hr over 30 Minutes Intravenous On call to O.R. 04/01/24 0700 04/01/24 0731   04/01/24 0730  cefoTEtan (CEFOTAN) 2 g in sodium chloride  0.9 %  100 mL IVPB        2 g 200 mL/hr over 30 Minutes Intravenous On call to O.R. 04/01/24 0640 04/01/24 1924   03/31/24 1300  cefoTEtan (CEFOTAN) 2 g in sodium chloride  0.9 % 100 mL IVPB  Status:  Discontinued        2 g 200 mL/hr over 30 Minutes Intravenous On call to O.R. 03/31/24 0909 03/31/24 1601   03/31/24 0945  cefTRIAXone (ROCEPHIN) 2 g in sodium chloride  0.9 % 100 mL IVPB  Status:  Discontinued        2 g 200 mL/hr over 30 Minutes Intravenous On call to O.R. 03/31/24 0854 03/31/24 0909        Assessment/Plan POD 1, s/p ex lap with LOA and SBR for SBO, Dr. Teresa 11/13 -cont NGT and  await bowel function -mobilize, pulm toilet -DC foley today -multi-modal pain control -labs in am -discussed plan with patient and daughter -d/w primary service this am as well  FEN - NPO/NGT/IVFs VTE - Lovenox  ID - rocephin on call to OR, no further needed    LOS: 6 days    Burnard FORBES Banter , [facility] 04/02/2024, 10:48 AM Please see Amion for pager number during day hours 7:00am-4:30pm or 7:00am -11:30am on weekends

## 2024-04-02 NOTE — Telephone Encounter (Signed)
 Pt.s daughter is asking for MRI order since mother is already at [facility] for GI issue

## 2024-04-02 NOTE — Progress Notes (Signed)
 Mobility Specialist Progress Note:    04/02/24 1105  Mobility  Activity  (Chair Exercises)  Level of Assistance Independent  Range of Motion/Exercises Active  Activity Response Tolerated fair  Mobility Referral Yes  Mobility visit 1 Mobility  Mobility Specialist Start Time (ACUTE ONLY) 1000  Mobility Specialist Stop Time (ACUTE ONLY) 1015  Mobility Specialist Time Calculation (min) (ACUTE ONLY) 15 min   Pt was received in recliner and agreed to mobility. Opted for chair exercises due to tiredness. Seated BLE Exercises: 10 reps each  2) Knee Extension: 1 x 5 each leg  3) Marching: 1 x 5 each leg   4) Hip Adduction (pillow squeezes): 1 x 10  No complaints during session. Returned to recliner with all needs met. Call bell in reach. Left in room with family.    [employer] - Mobility Specialist

## 2024-04-03 DIAGNOSIS — K56609 Unspecified intestinal obstruction, unspecified as to partial versus complete obstruction: Secondary | ICD-10-CM | POA: Diagnosis not present

## 2024-04-03 LAB — BASIC METABOLIC PANEL WITH GFR
Anion gap: 10 (ref 5–15)
BUN: 23 mg/dL (ref 8–23)
CO2: 28 mmol/L (ref 22–32)
Calcium: 8.5 mg/dL — ABNORMAL LOW (ref 8.9–10.3)
Chloride: 95 mmol/L — ABNORMAL LOW (ref 98–111)
Creatinine, Ser: 0.89 mg/dL (ref 0.44–1.00)
GFR, Estimated: >60 mL/min (ref >60)
Glucose, Bld: 76 mg/dL (ref 70–99)
Potassium: 3.7 mmol/L (ref 3.5–5.1)
Sodium: 134 mmol/L — ABNORMAL LOW (ref 135–145)

## 2024-04-03 LAB — CBC
HCT: 30.3 % — ABNORMAL LOW (ref 36.0–46.0)
Hemoglobin: 10.6 g/dL — ABNORMAL LOW (ref 12.0–15.0)
MCH: 31.6 pg (ref 26.0–34.0)
MCHC: 35 g/dL (ref 30.0–36.0)
MCV: 90.4 fL (ref 80.0–100.0)
Platelets: 229 K/uL (ref 150–400)
RBC: 3.35 MIL/uL — ABNORMAL LOW (ref 3.87–5.11)
RDW: 12.2 % (ref 11.5–15.5)
WBC: 13.9 K/uL — ABNORMAL HIGH (ref 4.0–10.5)
nRBC: 0 % (ref 0.0–0.2)

## 2024-04-03 NOTE — Plan of Care (Signed)
  Problem: Clinical Measurements: Goal: Ability to maintain clinical measurements within normal limits will improve Outcome: Progressing Goal: Will remain free from infection Outcome: Progressing Goal: Diagnostic test results will improve Outcome: Progressing Goal: Respiratory complications will improve Outcome: Progressing Goal: Cardiovascular complication will be avoided Outcome: Progressing   Problem: Activity: Goal: Risk for activity intolerance will decrease Outcome: Progressing   Problem: Nutrition: Goal: Adequate nutrition will be maintained Outcome: Not Progressing   Problem: Coping: Goal: Level of anxiety will decrease Outcome: Progressing   Problem: Elimination: Goal: Will not experience complications related to bowel motility Outcome: Progressing Goal: Will not experience complications related to urinary retention Outcome: Progressing   Problem: Pain Managment: Goal: General experience of comfort will improve and/or be controlled Outcome: Progressing

## 2024-04-03 NOTE — Progress Notes (Signed)
 PROGRESS NOTE    Dominique Mooney  FMW:[id number] DOB: Apr 24, 1951 DOA: 03/27/2024 PCP: Emerick Avelina POUR, PA-C    Brief Narrative:  73 year old with history of nonobstructive coronary artery disease, CKD stage IIIa, hypertension hyperlipidemia presented with nausea vomiting and abdominal pain.  Found to have small bowel obstruction.  Treated conservatively but failed. 11/13, underwent ex lap with bowel resection.  Subjective: Patient seen and examined.  Looks comfortable.  Denies any issues.  Daughter at the bedside.  No flatus or bowel movements yet. She had brain MRI scheduled as outpatient and patient was unable to make it for dementia workup.  Her daughter requested if we can order MRI brain while she is in the hospital.  Will order.  Assessment & Plan:   Complete small bowel obstruction: Failed conservative management with n.p.o. , NG tube drainage. Ex lap with lysis of adhesion and small bowel resection and anastomosis 11/13 N.p.o., IV fluids, continue adequate pain medications.  Mobilize.  NG tube drainage.  Postop management as per surgery.  No return of bowel function yet.  Cognitive impairment: Fairly stable.  From home.  Brain MRI today.  Hypothyroidism: On Synthroid .  Currently on IV Synthroid .  TSH 7.7.  Hypertension: Blood pressure stable.  CKD stage III: Stable.  Continue monitoring electrolytes.    DVT prophylaxis: enoxaparin  (LOVENOX ) injection 30 mg Start: 04/02/24 1200 SCDs Start: 03/27/24 2136   Code Status: Full code Family Communication: Daughter at the bedside Disposition Plan: Status is: Inpatient Remains inpatient appropriate because: Immediate after surgery.     Consultants:  General surgery  Procedures:  Ex lap and bowel repair  Antimicrobials:  Perioperative     Objective: Vitals:   04/02/24 1027 04/02/24 1351 04/02/24 2017 04/03/24 0614  BP: 126/74 104/69 108/69 108/66  Pulse: 88 99 97 75  Resp: 18 18 14 16   Temp: 98.6 F (37 C)  98.6 F (37 C) 98.5 F (36.9 C) 98.5 F (36.9 C)  TempSrc: Oral Oral    SpO2: 99% 98% 98% 98%  Weight:      Height:        Intake/Output Summary (Last 24 hours) at 04/03/2024 1045 Last data filed at 04/03/2024 0900 Gross per 24 hour  Intake 290 ml  Output 2200 ml  Net -1910 ml   Filed Weights   03/27/24 1809 03/27/24 1815 03/31/24 1543  Weight: 52.2 kg 52.2 kg 52.2 kg    Examination:  General: Looks fairly comfortable.  Pleasant interactive.  Confused.  Mobilizing around. Cardiovascular: S1-S2 normal. Respiratory: On room air.  Bilateral clear. Gastrointestinal: Postop.  Mild tenderness present.  NG tube with biliary drain.  Bowel sounds absent.     Data Reviewed: I have personally reviewed following labs and imaging studies  CBC: Recent Labs  Lab 03/27/24 1833 03/28/24 0518 03/29/24 0508 03/30/24 0540 03/31/24 0516 04/01/24 0521 04/03/24 0452  WBC 12.4*   < > 9.6 8.9 7.0 8.9 13.9*  NEUTROABS 10.7*  --   --   --   --   --   --   HGB 13.8   < > 12.7 12.1 13.0 13.3 10.6*  HCT 42.6   < > 38.4 35.7* 38.1 37.9 30.3*  MCV 92.4   < > 94.3 92.2 90.3 89.2 90.4  PLT 260   < > 211 194 225 235 229   < > = values in this interval not displayed.   Basic Metabolic Panel: Recent Labs  Lab 03/29/24 0508 03/30/24 0540 03/31/24 0516 04/01/24 0521 04/03/24  0452  NA 139 135 133* 132* 134*  K 4.2 3.8 3.7 3.6 3.7  CL 103 99 94* 92* 95*  CO2 29 26 27 25 28   GLUCOSE 94 84 82 77 76  BUN 11 12 11 11 23   CREATININE 0.88 0.78 0.72 0.68 0.89  CALCIUM  9.1 8.9 9.3 9.1 8.5*  MG 2.1 1.9 2.0 2.0  --   PHOS 3.0 2.4* 2.8 2.6  --    GFR: Estimated Creatinine Clearance: 43.7 mL/min (by C-G formula based on SCr of 0.89 mg/dL). Liver Function Tests: Recent Labs  Lab 03/27/24 1833 03/29/24 0508 03/30/24 0540 03/31/24 0516 04/01/24 0521  AST 38  --   --   --   --   ALT <5  --   --   --   --   ALKPHOS 73  --   --   --   --   BILITOT 0.8  --   --   --   --   PROT 7.7  --   --    --   --   ALBUMIN 4.3 3.6 3.6 3.9 3.7   Recent Labs  Lab 03/27/24 1833  LIPASE 36   No results for input(s): AMMONIA in the last 168 hours. Coagulation Profile: No results for input(s): INR, PROTIME in the last 168 hours. Cardiac Enzymes: No results for input(s): CKTOTAL, CKMB, CKMBINDEX, TROPONINI in the last 168 hours. BNP (last 3 results) No results for input(s): PROBNP in the last 8760 hours. HbA1C: No results for input(s): HGBA1C in the last 72 hours. CBG: No results for input(s): GLUCAP in the last 168 hours. Lipid Profile: No results for input(s): CHOL, HDL, LDLCALC, TRIG, CHOLHDL, LDLDIRECT in the last 72 hours. Thyroid  Function Tests: No results for input(s): TSH, T4TOTAL, FREET4, T3FREE, THYROIDAB in the last 72 hours. Anemia Panel: No results for input(s): VITAMINB12, FOLATE, FERRITIN, TIBC, IRON, RETICCTPCT in the last 72 hours. Sepsis Labs: No results for input(s): PROCALCITON, LATICACIDVEN in the last 168 hours.  No results found for this or any previous visit (from the past 240 hours).       Radiology Studies: No results found.       Scheduled Meds:  enoxaparin  (LOVENOX ) injection  30 mg Subcutaneous Q24H   levothyroxine   50 mcg Intravenous Daily   Continuous Infusions:  lactated ringers  125 mL/hr at 04/01/24 1718     LOS: 7 days    Time spent: 35 minutes    Renato Applebaum, MD [facility]

## 2024-04-03 NOTE — Progress Notes (Signed)
 2 Days Post-Op  Subjective: No bowel function. Daughter at bedside. Pain well controlled  Objective: Vital signs in last 24 hours: Temp:  [98.5 F (36.9 C)-98.6 F (37 C)] 98.5 F (36.9 C) (11/15 0614) Pulse Rate:  [75-99] 75 (11/15 0614) Resp:  [14-18] 16 (11/15 0614) BP: (104-126)/(66-74) 108/66 (11/15 0614) SpO2:  [98 %-99 %] 98 % (11/15 0614) Last BM Date : 03/26/24  Intake/Output from previous day: 11/14 0701 - 11/15 0700 In: 230 [NG/GT:230] Out: 2500 [Urine:1050; Emesis/NG output:1450] Intake/Output this shift: No intake/output data recorded.  PE: Gen: NAD HEENT: NGT in place with bilious output Abd: soft, appropriately tender, ND, incision c/d/I with staples and honeycomb dressing  Lab Results:  Recent Labs    04/01/24 0521 04/03/24 0452  WBC 8.9 13.9*  HGB 13.3 10.6*  HCT 37.9 30.3*  PLT 235 229   BMET Recent Labs    04/01/24 0521 04/03/24 0452  NA 132* 134*  K 3.6 3.7  CL 92* 95*  CO2 25 28  GLUCOSE 77 76  BUN 11 23  CREATININE 0.68 0.89  CALCIUM  9.1 8.5*   PT/INR No results for input(s): LABPROT, INR in the last 72 hours. CMP     Component Value Date/Time   NA 134 (L) 04/03/2024 0452   NA 142 08/09/2022 0913   K 3.7 04/03/2024 0452   CL 95 (L) 04/03/2024 0452   CO2 28 04/03/2024 0452   GLUCOSE 76 04/03/2024 0452   BUN 23 04/03/2024 0452   BUN 9 08/09/2022 0913   CREATININE 0.89 04/03/2024 0452   CALCIUM  8.5 (L) 04/03/2024 0452   PROT 7.7 03/27/2024 1833   PROT 6.6 08/09/2022 0913   ALBUMIN 3.7 04/01/2024 0521   ALBUMIN 4.0 08/09/2022 0913   AST 38 03/27/2024 1833   ALT <5 03/27/2024 1833   ALKPHOS 73 03/27/2024 1833   BILITOT 0.8 03/27/2024 1833   BILITOT 0.7 08/09/2022 0913   GFRNONAA >60 04/03/2024 0452   GFRAA >60 12/31/2019 1156   Lipase     Component Value Date/Time   LIPASE 36 03/27/2024 1833       Studies/Results: No results found.   Anti-infectives: Anti-infectives (From admission, onward)     Start     Dose/Rate Route Frequency Ordered Stop   04/01/24 0800  cefTRIAXone (ROCEPHIN) 2 g in sodium chloride  0.9 % 100 mL IVPB  Status:  Discontinued        2 g 200 mL/hr over 30 Minutes Intravenous On call to O.R. 04/01/24 0700 04/01/24 0731   04/01/24 0730  cefoTEtan (CEFOTAN) 2 g in sodium chloride  0.9 % 100 mL IVPB        2 g 200 mL/hr over 30 Minutes Intravenous On call to O.R. 04/01/24 0640 04/01/24 1924   03/31/24 1300  cefoTEtan (CEFOTAN) 2 g in sodium chloride  0.9 % 100 mL IVPB  Status:  Discontinued        2 g 200 mL/hr over 30 Minutes Intravenous On call to O.R. 03/31/24 0909 03/31/24 1601   03/31/24 0945  cefTRIAXone (ROCEPHIN) 2 g in sodium chloride  0.9 % 100 mL IVPB  Status:  Discontinued        2 g 200 mL/hr over 30 Minutes Intravenous On call to O.R. 03/31/24 0854 03/31/24 0909        Assessment/Plan POD 2, s/p ex lap with LOA and SBR for SBO, Dr. Teresa 11/13 -cont NGT and await bowel function  -NG with high output but UOP and electrolytes are  appropriate  -mobilize, pulm toilet -multi-modal pain control -labs in am -discussed plan with patient and daughter -d/w primary service this am as well  FEN - NPO/NGT/IVFs VTE - Lovenox  ID - rocephin on call to OR, no further needed    LOS: 7 days    Dominique JAYSON Ned, MD  Colorectal and General Surgery [facility]

## 2024-04-03 NOTE — Plan of Care (Signed)

## 2024-04-04 ENCOUNTER — Inpatient Hospital Stay (HOSPITAL_COMMUNITY)

## 2024-04-04 DIAGNOSIS — K56609 Unspecified intestinal obstruction, unspecified as to partial versus complete obstruction: Secondary | ICD-10-CM | POA: Diagnosis not present

## 2024-04-04 DIAGNOSIS — I2510 Atherosclerotic heart disease of native coronary artery without angina pectoris: Secondary | ICD-10-CM | POA: Diagnosis not present

## 2024-04-04 DIAGNOSIS — E782 Mixed hyperlipidemia: Secondary | ICD-10-CM | POA: Diagnosis not present

## 2024-04-04 DIAGNOSIS — N1831 Chronic kidney disease, stage 3a: Secondary | ICD-10-CM | POA: Diagnosis not present

## 2024-04-04 LAB — CBC
HCT: 30.5 % — ABNORMAL LOW (ref 36.0–46.0)
Hemoglobin: 10.4 g/dL — ABNORMAL LOW (ref 12.0–15.0)
MCH: 31 pg (ref 26.0–34.0)
MCHC: 34.1 g/dL (ref 30.0–36.0)
MCV: 91 fL (ref 80.0–100.0)
Platelets: 275 K/uL (ref 150–400)
RBC: 3.35 MIL/uL — ABNORMAL LOW (ref 3.87–5.11)
RDW: 12 % (ref 11.5–15.5)
WBC: 16.8 K/uL — ABNORMAL HIGH (ref 4.0–10.5)
nRBC: 0 % (ref 0.0–0.2)

## 2024-04-04 LAB — BASIC METABOLIC PANEL WITH GFR
Anion gap: 13 (ref 5–15)
BUN: 14 mg/dL (ref 8–23)
CO2: 27 mmol/L (ref 22–32)
Calcium: 8.3 mg/dL — ABNORMAL LOW (ref 8.9–10.3)
Chloride: 95 mmol/L — ABNORMAL LOW (ref 98–111)
Creatinine, Ser: 0.73 mg/dL (ref 0.44–1.00)
GFR, Estimated: >60 mL/min (ref >60)
Glucose, Bld: 66 mg/dL — ABNORMAL LOW (ref 70–99)
Potassium: 3.5 mmol/L (ref 3.5–5.1)
Sodium: 135 mmol/L (ref 135–145)

## 2024-04-04 MED ORDER — LORAZEPAM 2 MG/ML IJ SOLN: 0.5000 mg | Freq: Once | INTRAMUSCULAR | Status: DC | PRN

## 2024-04-04 MED ORDER — LORAZEPAM 2 MG/ML IJ SOLN
1.0000 mg | Freq: Once | INTRAMUSCULAR | Status: AC | PRN
  Administered 2024-04-04: 1 mg via INTRAVENOUS
  Filled 2024-04-04: qty 1

## 2024-04-04 MED ORDER — DEXTROSE IN LACTATED RINGERS 5 % IV SOLN: INTRAVENOUS | Status: DC

## 2024-04-04 NOTE — Progress Notes (Signed)
 PROGRESS NOTE  Dominique Mooney FMW:[id number] DOB: 03-28-1951   PCP: Emerick Avelina POUR, PA-C  Patient is from: Home.  Lives alone.  Family checks on her every night.  DOA: 03/27/2024 LOS: 8  Chief complaints Chief Complaint  Patient presents with   Abdominal Pain     Brief Narrative / Interim history: 73 year old F with PMH of nonobstructive CAD, CKD-3A, memory loss,  DILI due to statin, post ERCP pancreatitis in 2000, asthma, HTN and HLD presented to ED with nausea, vomiting and abdominal pain, and admitted with small bowel obstruction.  CT abdomen and pelvis showed SBO with 2 adjacent transition points in RLQ and fecalization of the bowel between the transition point concerning for internal hernia or closed-loop obstruction.  General surgery consulted and admitted.  Initially managed conservatively with NG tube decompression.  SBO persisted.  She underwent ex lap with LOA on 11/13 by Dr. Pizza.  Remains n.p.o. with NG tube pending return of bowel function.  Subjective: Seen and examined earlier this morning.  No major events overnight or this morning.  No complaints other than feeling hungry.  Feels like she passed some flatus.  About 1.9 L from NG tube.  Daughter at bedside.   Assessment and plan: Small bowel obstruction: Presents with N/V and pain.  CT showed SBO with adjacent transition points in the RLQ, concerning for internal hernia or closed-loop obstruction.  SBO persisted despite NG tube decompression. -S/p ex lap and LOA by Dr. Pizza on 11/13. -NG tube pending return of bowel function. -N.p.o. except sips, chips and hard candy. -Mobilize patient.  Memory impairment/cognitive impairment: Seems to be at baseline. - Follow MRI brain.   Hypothyroidism:TSH was 41.7 on 7/22 secondary to medication nonadherence, improved to 7.73 on recheck 9/23. Synthroid  on hold while NPO.   - Continue IV Synthroid  50 mcg daily.   Hyperlipidemia:Not on statin due to history of drug-induced  liver injury.   Nonobstructive CAD: Stable.  Not on statin due to history of drug-induced liver injury.   Essential hypertension: Not on meds at home.  Normotensive.  Mild intermittent asthma: Stable -Bronchodilators as needed  Hyponatremia: Mild - Monitor  CKD-3A: Stable -Continue monitoring  Leukocytosis: Demargination? -Continue monitoring. -May consider imaging if no improvement    Body mass index is 21.73 kg/m.          DVT prophylaxis:  enoxaparin  (LOVENOX ) injection 30 mg Start: 04/02/24 1200 SCDs Start: 03/27/24 2136  Code Status: Full code Family Communication: Updated patient's daughter at bedside. Level of care: Med-Surg Status is: Inpatient Remains inpatient appropriate because: SBO   Final disposition: Home   35 minutes with more than 50% spent in reviewing records, counseling patient/family and coordinating care.  Consultants:  General Surgery  Procedures: 11/13-ex lap and LOA by Dr. Pizza  Microbiology summarized: None  Objective: Vitals:   04/03/24 0614 04/03/24 1439 04/03/24 2104 04/04/24 0553  BP: 108/66 112/74 120/73 121/77  Pulse: 75 92 92 90  Resp: 16 16 15 16   Temp: 98.5 F (36.9 C) 99 F (37.2 C) 99 F (37.2 C) 99 F (37.2 C)  TempSrc:   Oral Oral  SpO2: 98% 98% 97% 98%  Weight:      Height:        Examination:  GENERAL: No apparent distress.  Nontoxic. HEENT: MMM.  Vision and hearing grossly intact.  NG tube in place. NECK: Supple.  No apparent JVD.  RESP:  No IWOB.  Fair aeration bilaterally. CVS:  RRR. Heart sounds normal.  ABD/GI/GU: BS+. Abd soft.  Appropriately tender.  Dressing DCI. MSK/EXT:  Moves extremities. No apparent deformity. No edema.  SKIN: no apparent skin lesion or wound NEURO: AA.  Oriented to self, place and family.  No apparent focal neuro deficit. PSYCH: Calm. Normal affect.   Sch Meds:  Scheduled Meds:  enoxaparin  (LOVENOX ) injection  30 mg Subcutaneous Q24H   levothyroxine   50 mcg  Intravenous Daily   Continuous Infusions:  dextrose  5% lactated ringers      PRN Meds:.albuterol , hydrALAZINE, HYDROmorphone  (DILAUDID ) injection, LORazepam, methocarbamol  (ROBAXIN ) injection, ondansetron  (ZOFRAN ) IV, phenol  Antimicrobials: Anti-infectives (From admission, onward)    Start     Dose/Rate Route Frequency Ordered Stop   04/01/24 0800  cefTRIAXone (ROCEPHIN) 2 g in sodium chloride  0.9 % 100 mL IVPB  Status:  Discontinued        2 g 200 mL/hr over 30 Minutes Intravenous On call to O.R. 04/01/24 0700 04/01/24 0731   04/01/24 0730  cefoTEtan (CEFOTAN) 2 g in sodium chloride  0.9 % 100 mL IVPB        2 g 200 mL/hr over 30 Minutes Intravenous On call to O.R. 04/01/24 0640 04/01/24 1924   03/31/24 1300  cefoTEtan (CEFOTAN) 2 g in sodium chloride  0.9 % 100 mL IVPB  Status:  Discontinued        2 g 200 mL/hr over 30 Minutes Intravenous On call to O.R. 03/31/24 0909 03/31/24 1601   03/31/24 0945  cefTRIAXone (ROCEPHIN) 2 g in sodium chloride  0.9 % 100 mL IVPB  Status:  Discontinued        2 g 200 mL/hr over 30 Minutes Intravenous On call to O.R. 03/31/24 9145 03/31/24 0909        I have personally reviewed the following labs and images: CBC: Recent Labs  Lab 03/30/24 0540 03/31/24 0516 04/01/24 0521 04/03/24 0452 04/04/24 0509  WBC 8.9 7.0 8.9 13.9* 16.8*  HGB 12.1 13.0 13.3 10.6* 10.4*  HCT 35.7* 38.1 37.9 30.3* 30.5*  MCV 92.2 90.3 89.2 90.4 91.0  PLT 194 225 235 229 275   BMP &GFR Recent Labs  Lab 03/29/24 0508 03/30/24 0540 03/31/24 0516 04/01/24 0521 04/03/24 0452 04/04/24 0509  NA 139 135 133* 132* 134* 135  K 4.2 3.8 3.7 3.6 3.7 3.5  CL 103 99 94* 92* 95* 95*  CO2 29 26 27 25 28 27   GLUCOSE 94 84 82 77 76 66*  BUN 11 12 11 11 23 14   CREATININE 0.88 0.78 0.72 0.68 0.89 0.73  CALCIUM  9.1 8.9 9.3 9.1 8.5* 8.3*  MG 2.1 1.9 2.0 2.0  --   --   PHOS 3.0 2.4* 2.8 2.6  --   --    Estimated Creatinine Clearance: 48.7 mL/min (by C-G formula based on SCr  of 0.73 mg/dL). Liver & Pancreas: Recent Labs  Lab 03/29/24 0508 03/30/24 0540 03/31/24 0516 04/01/24 0521  ALBUMIN 3.6 3.6 3.9 3.7   No results for input(s): LIPASE, AMYLASE in the last 168 hours.  No results for input(s): AMMONIA in the last 168 hours. Diabetic: No results for input(s): HGBA1C in the last 72 hours. No results for input(s): GLUCAP in the last 168 hours. Cardiac Enzymes: No results for input(s): CKTOTAL, CKMB, CKMBINDEX, TROPONINI in the last 168 hours. No results for input(s): PROBNP in the last 8760 hours. Coagulation Profile: No results for input(s): INR, PROTIME in the last 168 hours. Thyroid  Function Tests: No results for input(s): TSH, T4TOTAL, FREET4, T3FREE, THYROIDAB in the last 72  hours. Lipid Profile: No results for input(s): CHOL, HDL, LDLCALC, TRIG, CHOLHDL, LDLDIRECT in the last 72 hours. Anemia Panel: No results for input(s): VITAMINB12, FOLATE, FERRITIN, TIBC, IRON, RETICCTPCT in the last 72 hours. Urine analysis:    Component Value Date/Time   COLORURINE STRAW (A) 03/27/2024 2012   APPEARANCEUR CLEAR 03/27/2024 2012   LABSPEC 1.013 03/27/2024 2012   PHURINE 7.0 03/27/2024 2012   GLUCOSEU NEGATIVE 03/27/2024 2012   GLUCOSEU NEGATIVE 07/23/2010 1623   HGBUR NEGATIVE 03/27/2024 2012   BILIRUBINUR NEGATIVE 03/27/2024 2012   KETONESUR NEGATIVE 03/27/2024 2012   PROTEINUR NEGATIVE 03/27/2024 2012   UROBILINOGEN 0.2 07/23/2010 1623   NITRITE NEGATIVE 03/27/2024 2012   LEUKOCYTESUR NEGATIVE 03/27/2024 2012   Sepsis Labs: Invalid input(s): PROCALCITONIN, LACTICIDVEN  Microbiology: No results found for this or any previous visit (from the past 240 hours).  Radiology Studies: No results found.     Anylah Scheib T. Ashanty Coltrane [facility]  If 7PM-7AM, please contact night-coverage [email or web address] 04/04/2024, 11:06 AM

## 2024-04-04 NOTE — Plan of Care (Signed)
   Problem: Clinical Measurements: Goal: Diagnostic test results will improve Outcome: Progressing

## 2024-04-04 NOTE — Progress Notes (Signed)
 3 Days Post-Op  Subjective: No bowel function yet. Daughter at bedside. Pain controlled  Objective: Vital signs in last 24 hours: Temp:  [99 F (37.2 C)] 99 F (37.2 C) (11/16 0553) Pulse Rate:  [90-92] 90 (11/16 0553) Resp:  [15-16] 16 (11/16 0553) BP: (112-121)/(73-77) 121/77 (11/16 0553) SpO2:  [97 %-98 %] 98 % (11/16 0553) Last BM Date : 03/26/24  Intake/Output from previous day: 11/15 0701 - 11/16 0700 In: 1620 [I.V.:1500; NG/GT:120] Out: 1850 [Emesis/NG output:1850] Intake/Output this shift: Total I/O In: 0  Out: 100 [Emesis/NG output:100]  PE: Gen: NAD HEENT: NGT in place with bilious output Abd: soft, appropriately tender, ND, incision c/d/I with staples and honeycomb dressing  Lab Results:  Recent Labs    04/03/24 0452 04/04/24 0509  WBC 13.9* 16.8*  HGB 10.6* 10.4*  HCT 30.3* 30.5*  PLT 229 275   BMET Recent Labs    04/03/24 0452 04/04/24 0509  NA 134* 135  K 3.7 3.5  CL 95* 95*  CO2 28 27  GLUCOSE 76 66*  BUN 23 14  CREATININE 0.89 0.73  CALCIUM  8.5* 8.3*   PT/INR No results for input(s): LABPROT, INR in the last 72 hours. CMP     Component Value Date/Time   NA 135 04/04/2024 0509   NA 142 08/09/2022 0913   K 3.5 04/04/2024 0509   CL 95 (L) 04/04/2024 0509   CO2 27 04/04/2024 0509   GLUCOSE 66 (L) 04/04/2024 0509   BUN 14 04/04/2024 0509   BUN 9 08/09/2022 0913   CREATININE 0.73 04/04/2024 0509   CALCIUM  8.3 (L) 04/04/2024 0509   PROT 7.7 03/27/2024 1833   PROT 6.6 08/09/2022 0913   ALBUMIN 3.7 04/01/2024 0521   ALBUMIN 4.0 08/09/2022 0913   AST 38 03/27/2024 1833   ALT <5 03/27/2024 1833   ALKPHOS 73 03/27/2024 1833   BILITOT 0.8 03/27/2024 1833   BILITOT 0.7 08/09/2022 0913   GFRNONAA >60 04/04/2024 0509   GFRAA >60 12/31/2019 1156   Lipase     Component Value Date/Time   LIPASE 36 03/27/2024 1833       Studies/Results: No results found.   Anti-infectives: Anti-infectives (From admission, onward)     Start     Dose/Rate Route Frequency Ordered Stop   04/01/24 0800  cefTRIAXone (ROCEPHIN) 2 g in sodium chloride  0.9 % 100 mL IVPB  Status:  Discontinued        2 g 200 mL/hr over 30 Minutes Intravenous On call to O.R. 04/01/24 0700 04/01/24 0731   04/01/24 0730  cefoTEtan (CEFOTAN) 2 g in sodium chloride  0.9 % 100 mL IVPB        2 g 200 mL/hr over 30 Minutes Intravenous On call to O.R. 04/01/24 0640 04/01/24 1924   03/31/24 1300  cefoTEtan (CEFOTAN) 2 g in sodium chloride  0.9 % 100 mL IVPB  Status:  Discontinued        2 g 200 mL/hr over 30 Minutes Intravenous On call to O.R. 03/31/24 0909 03/31/24 1601   03/31/24 0945  cefTRIAXone (ROCEPHIN) 2 g in sodium chloride  0.9 % 100 mL IVPB  Status:  Discontinued        2 g 200 mL/hr over 30 Minutes Intravenous On call to O.R. 03/31/24 0854 03/31/24 0909        Assessment/Plan POD 3, s/p ex lap with LOA and SBR for SBO, Dr. Teresa 11/13 -cont NGT and await bowel function  -NG with high output but UOP and  electrolytes are appropriate, ok for sips, chips and hard candy -mobilize, pulm toilet -multi-modal pain control -labs in am -discussed plan with patient and daughter  FEN - NPO/NGT/IVFs VTE - Lovenox  ID - rocephin on call to OR, no further needed    LOS: 8 days    Bernarda JAYSON Ned, MD  Colorectal and General Surgery [facility]

## 2024-04-04 NOTE — Plan of Care (Signed)
 ?  Problem: Clinical Measurements: ?Goal: Ability to maintain clinical measurements within normal limits will improve ?Outcome: Progressing ?Goal: Will remain free from infection ?Outcome: Progressing ?Goal: Diagnostic test results will improve ?Outcome: Progressing ?  ?

## 2024-04-05 ENCOUNTER — Other Ambulatory Visit: Payer: Self-pay

## 2024-04-05 DIAGNOSIS — I2510 Atherosclerotic heart disease of native coronary artery without angina pectoris: Secondary | ICD-10-CM | POA: Diagnosis not present

## 2024-04-05 DIAGNOSIS — E43 Unspecified severe protein-calorie malnutrition: Secondary | ICD-10-CM | POA: Insufficient documentation

## 2024-04-05 DIAGNOSIS — N1831 Chronic kidney disease, stage 3a: Secondary | ICD-10-CM | POA: Diagnosis not present

## 2024-04-05 DIAGNOSIS — E782 Mixed hyperlipidemia: Secondary | ICD-10-CM | POA: Diagnosis not present

## 2024-04-05 DIAGNOSIS — K56609 Unspecified intestinal obstruction, unspecified as to partial versus complete obstruction: Secondary | ICD-10-CM | POA: Diagnosis not present

## 2024-04-05 LAB — COMPREHENSIVE METABOLIC PANEL WITH GFR
ALT: <5 U/L (ref 0–44)
AST: 27 U/L (ref 15–41)
Albumin: 2.9 g/dL — ABNORMAL LOW (ref 3.5–5.0)
Alkaline Phosphatase: 74 U/L (ref 38–126)
Anion gap: 10 (ref 5–15)
BUN: 6 mg/dL — ABNORMAL LOW (ref 8–23)
CO2: 31 mmol/L (ref 22–32)
Calcium: 8.5 mg/dL — ABNORMAL LOW (ref 8.9–10.3)
Chloride: 97 mmol/L — ABNORMAL LOW (ref 98–111)
Creatinine, Ser: 0.7 mg/dL (ref 0.44–1.00)
GFR, Estimated: >60 mL/min (ref >60)
Glucose, Bld: 126 mg/dL — ABNORMAL HIGH (ref 70–99)
Potassium: 3.1 mmol/L — ABNORMAL LOW (ref 3.5–5.1)
Sodium: 138 mmol/L (ref 135–145)
Total Bilirubin: 0.5 mg/dL (ref 0.0–1.2)
Total Protein: 6.2 g/dL — ABNORMAL LOW (ref 6.5–8.1)

## 2024-04-05 LAB — SURGICAL PATHOLOGY

## 2024-04-05 LAB — CBC WITH DIFFERENTIAL/PLATELET
Abs Immature Granulocytes: 0.13 K/uL — ABNORMAL HIGH (ref 0.00–0.07)
Basophils Absolute: 0.1 K/uL (ref 0.0–0.1)
Basophils Relative: 1 %
Eosinophils Absolute: 0.1 K/uL (ref 0.0–0.5)
Eosinophils Relative: 1 %
HCT: 32.9 % — ABNORMAL LOW (ref 36.0–46.0)
Hemoglobin: 10.9 g/dL — ABNORMAL LOW (ref 12.0–15.0)
Immature Granulocytes: 1 %
Lymphocytes Relative: 10 %
Lymphs Abs: 1.3 K/uL (ref 0.7–4.0)
MCH: 31.1 pg (ref 26.0–34.0)
MCHC: 33.1 g/dL (ref 30.0–36.0)
MCV: 94 fL (ref 80.0–100.0)
Monocytes Absolute: 1.4 K/uL — ABNORMAL HIGH (ref 0.1–1.0)
Monocytes Relative: 11 %
Neutro Abs: 9.8 K/uL — ABNORMAL HIGH (ref 1.7–7.7)
Neutrophils Relative %: 76 %
Platelets: 308 K/uL (ref 150–400)
RBC: 3.5 MIL/uL — ABNORMAL LOW (ref 3.87–5.11)
RDW: 12.1 % (ref 11.5–15.5)
WBC: 12.8 K/uL — ABNORMAL HIGH (ref 4.0–10.5)
nRBC: 0 % (ref 0.0–0.2)

## 2024-04-05 LAB — GLUCOSE, CAPILLARY
Glucose-Capillary: 113 mg/dL — ABNORMAL HIGH (ref 70–99)
Glucose-Capillary: 137 mg/dL — ABNORMAL HIGH (ref 70–99)

## 2024-04-05 LAB — PHOSPHORUS: Phosphorus: 1.2 mg/dL — ABNORMAL LOW (ref 2.5–4.6)

## 2024-04-05 LAB — MAGNESIUM: Magnesium: 2.1 mg/dL (ref 1.7–2.4)

## 2024-04-05 LAB — PREALBUMIN: Prealbumin: 6 mg/dL — ABNORMAL LOW (ref 18–38)

## 2024-04-05 MED ORDER — KCL-LACTATED RINGERS-D5W 20 MEQ/L IV SOLN
INTRAVENOUS | Status: DC
  Filled 2024-04-05 (×2): qty 1000

## 2024-04-05 MED ORDER — THIAMINE HCL 100 MG/ML IJ SOLN
100.0000 mg | Freq: Every day | INTRAMUSCULAR | Status: AC
Start: 2024-04-05 — End: 2024-04-10
  Administered 2024-04-05 – 2024-04-09 (×4): 100 mg via INTRAVENOUS
  Filled 2024-04-05 (×5): qty 2

## 2024-04-05 MED ORDER — POTASSIUM CHLORIDE 10 MEQ/100ML IV SOLN
10.0000 meq | INTRAVENOUS | Status: AC
  Administered 2024-04-05: 10 meq via INTRAVENOUS
  Filled 2024-04-05 (×2): qty 100

## 2024-04-05 MED ORDER — SODIUM CHLORIDE 0.9% FLUSH: 10.0000 mL | INTRAVENOUS | Status: DC | PRN

## 2024-04-05 MED ORDER — INSULIN ASPART 100 UNIT/ML IJ SOLN
0.0000 [IU] | Freq: Four times a day (QID) | INTRAMUSCULAR | Status: DC
  Administered 2024-04-06 – 2024-04-08 (×6): 1 [IU] via SUBCUTANEOUS
  Administered 2024-04-08: 2 [IU] via SUBCUTANEOUS
  Administered 2024-04-08 (×2): 1 [IU] via SUBCUTANEOUS
  Administered 2024-04-09: 2 [IU] via SUBCUTANEOUS
  Administered 2024-04-09 – 2024-04-10 (×3): 1 [IU] via SUBCUTANEOUS
  Filled 2024-04-05 (×8): qty 1
  Filled 2024-04-05 (×2): qty 2
  Filled 2024-04-05 (×3): qty 1

## 2024-04-05 MED ORDER — TRAVASOL 10 % IV SOLN
INTRAVENOUS | Status: AC
  Filled 2024-04-05: qty 403.2

## 2024-04-05 MED ORDER — MENTHOL 3 MG MT LOZG
1.0000 | LOZENGE | OROMUCOSAL | Status: DC | PRN
  Filled 2024-04-05: qty 9

## 2024-04-05 MED ORDER — POTASSIUM PHOSPHATES 15 MMOLE/5ML IV SOLN
30.0000 mmol | Freq: Once | INTRAVENOUS | Status: AC
  Administered 2024-04-05: 30 mmol via INTRAVENOUS
  Filled 2024-04-05: qty 10

## 2024-04-05 MED ORDER — POTASSIUM CHLORIDE 10 MEQ/100ML IV SOLN
10.0000 meq | INTRAVENOUS | Status: AC
  Administered 2024-04-05 (×3): 10 meq via INTRAVENOUS
  Filled 2024-04-05 (×2): qty 100

## 2024-04-05 MED ORDER — ENOXAPARIN SODIUM 40 MG/0.4ML IJ SOSY
40.0000 mg | PREFILLED_SYRINGE | INTRAMUSCULAR | Status: DC
  Administered 2024-04-06 – 2024-04-19 (×12): 40 mg via SUBCUTANEOUS
  Filled 2024-04-05 (×12): qty 0.4

## 2024-04-05 MED ORDER — KCL-LACTATED RINGERS-D5W 20 MEQ/L IV SOLN
INTRAVENOUS | Status: AC
  Filled 2024-04-05 (×2): qty 1000

## 2024-04-05 MED ORDER — CHLORHEXIDINE GLUCONATE CLOTH 2 % EX PADS
6.0000 | MEDICATED_PAD | Freq: Every day | CUTANEOUS | Status: DC
  Administered 2024-04-05 – 2024-04-16 (×12): 6 via TOPICAL

## 2024-04-05 NOTE — Progress Notes (Signed)
 Mobility Specialist Progress Note:   04/05/24 1422  Mobility  Activity  (Chair exercises)  Level of Assistance Independent  Range of Motion/Exercises Active  Activity Response Tolerated well  Mobility Referral Yes  Mobility visit 1 Mobility  Mobility Specialist Start Time (ACUTE ONLY) 1413  Mobility Specialist Stop Time (ACUTE ONLY) 1425  Mobility Specialist Time Calculation (min) (ACUTE ONLY) 12 min   Pt was received in recliner and opted for chair mobility. Seated BLE Exercises:  1) Knee Extension: 1 x 10 (5 each leg)  2) Marching: 1 x 10  3) Hip Adduction (pillow squeezes): 1 x 10   No issues during session. Pt returned to recliner with all needs met. Call bell in reach.  [employer] - Mobility Specialist

## 2024-04-05 NOTE — Plan of Care (Signed)
 ?  Problem: Clinical Measurements: ?Goal: Ability to maintain clinical measurements within normal limits will improve ?Outcome: Progressing ?Goal: Will remain free from infection ?Outcome: Progressing ?Goal: Diagnostic test results will improve ?Outcome: Progressing ?  ?

## 2024-04-05 NOTE — Progress Notes (Addendum)
 PROGRESS NOTE  Dominique Mooney FMW:[id number] DOB: March 10, 1951   PCP: Emerick Avelina POUR, PA-C  Patient is from: Home.  Lives alone.  Family checks on her every night.  DOA: 03/27/2024 LOS: 9  Chief complaints Chief Complaint  Patient presents with   Abdominal Pain     Brief Narrative / Interim history: 73 year old F with PMH of nonobstructive CAD, CKD-3A, memory loss,  DILI due to statin, post ERCP pancreatitis in 2000, asthma, HTN and HLD presented to ED with nausea, vomiting and abdominal pain, and admitted with small bowel obstruction.  CT abdomen and pelvis showed SBO with 2 adjacent transition points in RLQ and fecalization of the bowel between the transition point concerning for internal hernia or closed-loop obstruction.  General surgery consulted and admitted.  Initially managed conservatively with NG tube decompression.  SBO persisted.  She underwent ex lap with LOA on 11/13 by Dr. Pizza.  Remains n.p.o. with NG tube pending return of bowel function.  Subjective: Seen and examined earlier this morning.  No major events overnight or this morning.  No complaints other than feeling hungry.  About 1 L from NG tube.   Assessment and plan: Small bowel obstruction: Presents with N/V and pain.  CT showed SBO with adjacent transition points in the RLQ, concerning for internal hernia or closed-loop obstruction.  SBO persisted despite NG tube decompression. -S/p ex lap and LOA by Dr. Pizza on 11/13. -NG tube pending return of bowel function. -N.p.o. except sips, chips and hard candy. -PICC line and TPN per surgery. -Mobilize patient.  Memory impairment/cognitive impairment: Seems to be at baseline.  MRI brain without acute intracranial abnormality or reversible cause of memory loss. - Reorientation and delirium precaution   Hypothyroidism:TSH was 41.7 on 7/22 secondary to medication nonadherence, improved to 7.73 on recheck 9/23. Synthroid  on hold while NPO.   - Continue IV Synthroid   50 mcg daily.   Hyperlipidemia:Not on statin due to history of drug-induced liver injury.   Nonobstructive CAD: Stable.  Not on statin due to history of drug-induced liver injury.   Essential hypertension: Not on meds at home.  Normotensive.  Mild intermittent asthma: Stable -Bronchodilators as needed  Hypokalemia/hypophosphatemia -Monitor replenish as appropriate.  Hyponatremia: Mild - Monitor  CKD-3A: Stable -Continue monitoring  Leukocytosis: Demargination?  Improved without antibiotics. -Continue monitoring.   Severe malnutrition Body mass index is 21.73 kg/m. Nutrition Problem: Severe Malnutrition Etiology: acute illness (SBO) Signs/Symptoms: severe muscle depletion, moderate fat depletion, energy intake < 75% for > 7 days Interventions: TPN   DVT prophylaxis:  enoxaparin  (LOVENOX ) injection 40 mg Start: 04/06/24 1200 SCDs Start: 03/27/24 2136  Code Status: Full code Family Communication: Updated patient's daughter at bedside. Level of care: Med-Surg Status is: Inpatient Remains inpatient appropriate because: SBO   Final disposition: Home   35 minutes with more than 50% spent in reviewing records, counseling patient/family and coordinating care.  Consultants:  General Surgery  Procedures: 11/13-ex lap and LOA by Dr. Pizza  Microbiology summarized: None  Objective: Vitals:   04/04/24 1307 04/04/24 2109 04/05/24 0541 04/05/24 1317  BP: 122/75 136/74 129/86 118/80  Pulse: 89 88 86 81  Resp:  15 16 18   Temp:  98.7 F (37.1 C) 98.3 F (36.8 C) 99 F (37.2 C)  TempSrc:  Oral Oral Oral  SpO2: 98% 98% 98% 98%  Weight:      Height:        Examination:  GENERAL: No apparent distress.  Nontoxic. HEENT: MMM.  Vision and  hearing grossly intact.  NG tube in place. NECK: Supple.  No apparent JVD.  RESP:  No IWOB.  Fair aeration bilaterally. CVS:  RRR. Heart sounds normal.  ABD/GI/GU: BS+. Abd soft.  Honeycomb dressing in place. MSK/EXT:  Moves  extremities. No apparent deformity. No edema.  SKIN: As above. NEURO: AA.  Oriented to self, place and family.  No apparent focal neuro deficit. PSYCH: Calm. Normal affect.   Sch Meds:  Scheduled Meds:  [START ON 04/06/2024] enoxaparin  (LOVENOX ) injection  40 mg Subcutaneous Q24H   insulin aspart  0-9 Units Subcutaneous Q6H   levothyroxine   50 mcg Intravenous Daily   thiamine (VITAMIN B1) injection  100 mg Intravenous Daily   Continuous Infusions:  dextrose  5% lactated ringers  with KCl 20 mEq/L 125 mL/hr at 04/05/24 0843   dextrose  5% lactated ringers  with KCl 20 mEq/L     potassium chloride     potassium PHOSPHATE IVPB (in mmol) 30 mmol (04/05/24 0929)   TPN ADULT (ION)     PRN Meds:.albuterol , hydrALAZINE, HYDROmorphone  (DILAUDID ) injection, methocarbamol  (ROBAXIN ) injection, ondansetron  (ZOFRAN ) IV, phenol  Antimicrobials: Anti-infectives (From admission, onward)    Start     Dose/Rate Route Frequency Ordered Stop   04/01/24 0800  cefTRIAXone (ROCEPHIN) 2 g in sodium chloride  0.9 % 100 mL IVPB  Status:  Discontinued        2 g 200 mL/hr over 30 Minutes Intravenous On call to O.R. 04/01/24 0700 04/01/24 0731   04/01/24 0730  cefoTEtan (CEFOTAN) 2 g in sodium chloride  0.9 % 100 mL IVPB        2 g 200 mL/hr over 30 Minutes Intravenous On call to O.R. 04/01/24 0640 04/01/24 1924   03/31/24 1300  cefoTEtan (CEFOTAN) 2 g in sodium chloride  0.9 % 100 mL IVPB  Status:  Discontinued        2 g 200 mL/hr over 30 Minutes Intravenous On call to O.R. 03/31/24 0909 03/31/24 1601   03/31/24 0945  cefTRIAXone (ROCEPHIN) 2 g in sodium chloride  0.9 % 100 mL IVPB  Status:  Discontinued        2 g 200 mL/hr over 30 Minutes Intravenous On call to O.R. 03/31/24 9145 03/31/24 0909        I have personally reviewed the following labs and images: CBC: Recent Labs  Lab 03/31/24 0516 04/01/24 0521 04/03/24 0452 04/04/24 0509 04/05/24 0455  WBC 7.0 8.9 13.9* 16.8* 12.8*  NEUTROABS  --   --    --   --  9.8*  HGB 13.0 13.3 10.6* 10.4* 10.9*  HCT 38.1 37.9 30.3* 30.5* 32.9*  MCV 90.3 89.2 90.4 91.0 94.0  PLT 225 235 229 275 308   BMP &GFR Recent Labs  Lab 03/30/24 0540 03/31/24 0516 04/01/24 0521 04/03/24 0452 04/04/24 0509 04/05/24 0455  NA 135 133* 132* 134* 135 138  K 3.8 3.7 3.6 3.7 3.5 3.1*  CL 99 94* 92* 95* 95* 97*  CO2 26 27 25 28 27 31   GLUCOSE 84 82 77 76 66* 126*  BUN 12 11 11 23 14  6*  CREATININE 0.78 0.72 0.68 0.89 0.73 0.70  CALCIUM  8.9 9.3 9.1 8.5* 8.3* 8.5*  MG 1.9 2.0 2.0  --   --  2.1  PHOS 2.4* 2.8 2.6  --   --  1.2*   Estimated Creatinine Clearance: 48.7 mL/min (by C-G formula based on SCr of 0.7 mg/dL). Liver & Pancreas: Recent Labs  Lab 03/30/24 0540 03/31/24 0516 04/01/24 0521 04/05/24 0455  AST  --   --   --  27  ALT  --   --   --  <5  ALKPHOS  --   --   --  74  BILITOT  --   --   --  0.5  PROT  --   --   --  6.2*  ALBUMIN 3.6 3.9 3.7 2.9*   No results for input(s): LIPASE, AMYLASE in the last 168 hours.  No results for input(s): AMMONIA in the last 168 hours. Diabetic: No results for input(s): HGBA1C in the last 72 hours. No results for input(s): GLUCAP in the last 168 hours. Cardiac Enzymes: No results for input(s): CKTOTAL, CKMB, CKMBINDEX, TROPONINI in the last 168 hours. No results for input(s): PROBNP in the last 8760 hours. Coagulation Profile: No results for input(s): INR, PROTIME in the last 168 hours. Thyroid  Function Tests: No results for input(s): TSH, T4TOTAL, FREET4, T3FREE, THYROIDAB in the last 72 hours. Lipid Profile: No results for input(s): CHOL, HDL, LDLCALC, TRIG, CHOLHDL, LDLDIRECT in the last 72 hours. Anemia Panel: No results for input(s): VITAMINB12, FOLATE, FERRITIN, TIBC, IRON, RETICCTPCT in the last 72 hours. Urine analysis:    Component Value Date/Time   COLORURINE STRAW (A) 03/27/2024 2012   APPEARANCEUR CLEAR 03/27/2024 2012    LABSPEC 1.013 03/27/2024 2012   PHURINE 7.0 03/27/2024 2012   GLUCOSEU NEGATIVE 03/27/2024 2012   GLUCOSEU NEGATIVE 07/23/2010 1623   HGBUR NEGATIVE 03/27/2024 2012   BILIRUBINUR NEGATIVE 03/27/2024 2012   KETONESUR NEGATIVE 03/27/2024 2012   PROTEINUR NEGATIVE 03/27/2024 2012   UROBILINOGEN 0.2 07/23/2010 1623   NITRITE NEGATIVE 03/27/2024 2012   LEUKOCYTESUR NEGATIVE 03/27/2024 2012   Sepsis Labs: Invalid input(s): PROCALCITONIN, LACTICIDVEN  Microbiology: No results found for this or any previous visit (from the past 240 hours).  Radiology Studies: US  EKG SITE RITE Result Date: 04/05/2024 If Site Rite image not attached, placement could not be confirmed due to current cardiac rhythm.      Hayat Warbington T. Chandlar Staebell [facility]  If 7PM-7AM, please contact night-coverage [email or web address] 04/05/2024, 2:40 PM

## 2024-04-05 NOTE — Progress Notes (Signed)
 ECG confirmation for tip location and catheter to vessel ratio image placed in paper chart due to image not transmitting electronically.

## 2024-04-05 NOTE — Progress Notes (Signed)
 Peripherally Inserted Central Catheter Placement  The IV Nurse has discussed with the patient and/or persons authorized to consent for the patient, the purpose of this procedure and the potential benefits and risks involved with this procedure.  The benefits include less needle sticks, lab draws from the catheter, and the patient may be discharged home with the catheter. Risks include, but not limited to, infection, bleeding, blood clot (thrombus formation), and puncture of an artery; nerve damage and irregular heartbeat and possibility to perform a PICC exchange if needed/ordered by physician.  Alternatives to this procedure were also discussed.  Bard Power PICC patient education guide, fact sheet on infection prevention and patient information card has been provided to patient /or left at bedside.  Discussed PICC with patient at bedside but she is having difficulty recalling what information has been shared.  She is okay with calling her daughter for consent.  Telephone consent obtained from daughter.  PICC Placement Documentation  PICC Double Lumen 04/05/24 Right Brachial 39 cm 0 cm (Active)  Indication for Insertion or Continuance of Line Administration of hyperosmolar/irritating solutions (i.e. TPN, Vancomycin, etc.) 04/05/24 1856  Exposed Catheter (cm) 0 cm 04/05/24 1856  Site Assessment Clean, Dry, Intact 04/05/24 1856  Lumen #1 Status Flushed;Saline locked;Blood return noted 04/05/24 1856  Lumen #2 Status Flushed;Saline locked;Blood return noted 04/05/24 1856  Dressing Type Transparent;Securing device 04/05/24 1856  Dressing Status Antimicrobial disc/dressing in place;Clean, Dry, Intact 04/05/24 1856  Line Care Connections checked and tightened 04/05/24 1856  Line Adjustment (NICU/IV Team Only) No 04/05/24 1856  Dressing Intervention New dressing;Adhesive placed at insertion site (IV team only) 04/05/24 1856  Dressing Change Due 04/12/24 04/05/24 1856       Avarey Yaeger, Cherene Place 04/05/2024, 6:57 PM

## 2024-04-05 NOTE — Progress Notes (Signed)
 Mobility Specialist Progress Note:   04/05/24 0909  Mobility  Activity Ambulated with assistance  Level of Assistance Contact guard assist, steadying assist  Assistive Device Front wheel walker  Distance Ambulated (ft) 130 ft  Activity Response Tolerated well  Mobility Referral Yes  Mobility visit 1 Mobility  Mobility Specialist Start Time (ACUTE ONLY) [id number]  Mobility Specialist Stop Time (ACUTE ONLY) 0855  Mobility Specialist Time Calculation (min) (ACUTE ONLY) 13 min   Pt was received in bed and agreed to mobility. No complaints during session. Returned to recliner with all needs met. Call bell in reach. Left in room with family.  [employer] - Mobility Specialist

## 2024-04-05 NOTE — Progress Notes (Signed)
 4 Days Post-Op  Subjective: No bowel function yet. Daughter at bedside. Pain controlled  Objective: Vital signs in last 24 hours: Temp:  [98.3 F (36.8 C)-98.7 F (37.1 C)] 98.3 F (36.8 C) (11/17 0541) Pulse Rate:  [86-89] 86 (11/17 0541) Resp:  [15-16] 16 (11/17 0541) BP: (122-136)/(74-86) 129/86 (11/17 0541) SpO2:  [98 %] 98 % (11/17 0541) Last BM Date : 03/26/24  Intake/Output from previous day: 11/16 0701 - 11/17 0700 In: 2366.3 [P.O.:30; I.V.:2336.3] Out: 1750 [Urine:800; Emesis/NG output:950] Intake/Output this shift: Total I/O In: -  Out: 50 [Emesis/NG output:50]  PE: Gen: NAD HEENT: NGT in place with bilious output Abd: soft, appropriately tender, ND, incision c/d/I with staples and honeycomb dressing  Lab Results:  Recent Labs    04/04/24 0509 04/05/24 0455  WBC 16.8* 12.8*  HGB 10.4* 10.9*  HCT 30.5* 32.9*  PLT 275 308   BMET Recent Labs    04/04/24 0509 04/05/24 0455  NA 135 138  K 3.5 3.1*  CL 95* 97*  CO2 27 31  GLUCOSE 66* 126*  BUN 14 6*  CREATININE 0.73 0.70  CALCIUM  8.3* 8.5*   PT/INR No results for input(s): LABPROT, INR in the last 72 hours. CMP     Component Value Date/Time   NA 138 04/05/2024 0455   NA 142 08/09/2022 0913   K 3.1 (L) 04/05/2024 0455   CL 97 (L) 04/05/2024 0455   CO2 31 04/05/2024 0455   GLUCOSE 126 (H) 04/05/2024 0455   BUN 6 (L) 04/05/2024 0455   BUN 9 08/09/2022 0913   CREATININE 0.70 04/05/2024 0455   CALCIUM  8.5 (L) 04/05/2024 0455   PROT 6.2 (L) 04/05/2024 0455   PROT 6.6 08/09/2022 0913   ALBUMIN 2.9 (L) 04/05/2024 0455   ALBUMIN 4.0 08/09/2022 0913   AST 27 04/05/2024 0455   ALT <5 04/05/2024 0455   ALKPHOS 74 04/05/2024 0455   BILITOT 0.5 04/05/2024 0455   BILITOT 0.7 08/09/2022 0913   GFRNONAA >60 04/05/2024 0455   GFRAA >60 12/31/2019 1156   Lipase     Component Value Date/Time   LIPASE 36 03/27/2024 1833       Studies/Results: US  EKG SITE RITE Result Date:  04/05/2024 If Site Rite image not attached, placement could not be confirmed due to current cardiac rhythm.  MR BRAIN WO CONTRAST Result Date: 04/04/2024 EXAM: MRI BRAIN WITHOUT CONTRAST 04/04/2024 02:15:58 PM TECHNIQUE: Multiplanar multisequence MRI of the head/brain was performed without the administration of intravenous contrast. COMPARISON: CT head 08/17/2021 CLINICAL HISTORY: Dementia, vascular etiology suspected; MRI evaluation for dementia. FINDINGS: BRAIN AND VENTRICLES: No acute infarct. No intracranial hemorrhage. No mass. No midline shift. No hydrocephalus. The sella is unremarkable. Normal flow voids. Mild T2 hyperintensities in the Amor matter are compatible with mild chronic microvascular ischemic change. ORBITS: No acute abnormality. SINUSES AND MASTOIDS: No acute abnormality. BONES AND SOFT TISSUES: Normal marrow signal. No acute soft tissue abnormality. IMPRESSION: 1. No acute intracranial abnormality or reversible cause of memory loss . Electronically signed by: Gilmore Molt MD 04/04/2024 03:33 PM EST RP Workstation: [id number]     Anti-infectives: Anti-infectives (From admission, onward)    Start     Dose/Rate Route Frequency Ordered Stop   04/01/24 0800  cefTRIAXone (ROCEPHIN) 2 g in sodium chloride  0.9 % 100 mL IVPB  Status:  Discontinued        2 g 200 mL/hr over 30 Minutes Intravenous On call to O.R. 04/01/24 0700 04/01/24 0731   04/01/24  0730  cefoTEtan (CEFOTAN) 2 g in sodium chloride  0.9 % 100 mL IVPB        2 g 200 mL/hr over 30 Minutes Intravenous On call to O.R. 04/01/24 0640 04/01/24 1924   03/31/24 1300  cefoTEtan (CEFOTAN) 2 g in sodium chloride  0.9 % 100 mL IVPB  Status:  Discontinued        2 g 200 mL/hr over 30 Minutes Intravenous On call to O.R. 03/31/24 0909 03/31/24 1601   03/31/24 0945  cefTRIAXone (ROCEPHIN) 2 g in sodium chloride  0.9 % 100 mL IVPB  Status:  Discontinued        2 g 200 mL/hr over 30 Minutes Intravenous On call to O.R. 03/31/24 0854  03/31/24 0909        Assessment/Plan POD 4, s/p ex lap with LOA and SBR for SBO, Dr. Teresa 11/13 -cont NGT and await bowel function -given prolonged NPO status, will start picc/TNA.  D/w patient and daughter at beside.  Both agreeable. -NG with high output .  K and phos low today.  Replace today -mobilize, pulm toilet -multi-modal pain control -labs in am -discussed plan with primary service at bedside as well  FEN - NPO/NGT/IVFs/PICC/TNA VTE - Lovenox  ID - rocephin on call to OR, no further needed    LOS: 9 days    Bernarda JAYSON Ned, MD  Colorectal and General Surgery [facility]

## 2024-04-05 NOTE — Progress Notes (Signed)
 PHARMACY - TOTAL PARENTERAL NUTRITION CONSULT NOTE   Indication: Prolonged ileus  Patient Measurements: Height: 5' 1 (154.9 cm) Weight: 52.2 kg (115 lb) IBW/kg (Calculated) : 47.8 TPN AdjBW (KG): 52.2 Body mass index is 21.73 kg/m. Usual Weight:   Assessment: Patient is a 73 y.o F who presented to the ED on 03/27/24 with c/o abdominal pain and n/v. Abdominal CT on 03/27/24 showed SBO.   She subsequently underwent exp lap with small bowel resection, lysis of adhesions and enterorrhaphy on 04/01/24.  Pharmacy has been consulted on 04/05/24 to start TPN for prolonged ileus.  Glucose / Insulin: no hx DM; not on insulin - CBG (goal <150): 126 from cmet Electrolytes: K low 3.1 (KCL IV x4 runs ordered by MD), phos low 1.2 (kPhos 30 mmol IV x1 ordered by MD), Cl slightly low at 97;  CorrCa, Mag wnl - goal for ileus: Mag >2 and K >4 Renal: scr <1 Hepatic: LFTs wnl - albumin low 2.9 - prealbumin low 6 Intake / Output; MIVF:  D5LR with 20 meq KCL/L @ 125 ml/hr - I/O: + 616 mL - emesis/NG output: 960 mL - UOP: 800 mL GI Imaging: GI Surgeries / Procedures:  - 11/9: unsuccessful  placement of NG tube - 11/10: NGT - 11/13: exp lap with small bowel resection, lysis of adhesions and enterorrhaphy.   Central access: pending PICC placement TPN start date: 04/05/24 pending PICC placement   Nutritional Goals: Goal TPN rate is 70 mL/hr (provides 70 g of protein and 1632 kcals per day)  RD Assessment:    Per secure chat msg with Morna Lee:  - Tkcal: 1550-1750 - Protein:  65-80g  - 1.7L fluid daily - Pt is at risk for refeeding given NPO x 8 days, recommend Thiamine for 5 days.   Current Nutrition:  - NPO   Plan:   Today, at 1800:  - Start TPN at 40 mL/hr at 1800 - Electrolytes in TPN:  Na 50mEq/L K 50mEq/L Ca 5mEq/L Mg 5mEq/L Phos 15mmol/L. Cl:Ac 1:1 - Add standard MVI and trace elements to TPN - Initiate Sensitive q6h SSI and adjust as needed  - Reduce MIVF to 85  mL/hr at 1800 - thiamine 100 mg IV daily x 5 days (11/17 thru 11/21) - bmp, Mag, phos daily thru 11/20 - Monitor TPN labs on Mon/Thurs, at a minimum  Mohamedamin Nifong P 04/05/2024,10:48 AM

## 2024-04-05 NOTE — Progress Notes (Signed)
 Initial Nutrition Assessment  DOCUMENTATION CODES:   Severe malnutrition in context of acute illness/injury  INTERVENTION:   Monitor magnesium , potassium, and phosphorus daily for at least 3 days, MD to replete as needed, as pt is at risk for refeeding syndrome. -Recommend 100 mg Thiamine for at least 5 days.  -TPN management per Pharmacy -Daily weights while on TPN  -Monitor for diet advancement  NUTRITION DIAGNOSIS:   Severe Malnutrition related to acute illness (SBO) as evidenced by severe muscle depletion, moderate fat depletion, energy intake < 75% for > 7 days.  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  CT abdomen and pelvis showed SBO with 2 adjacent transition points in RLQ and fecalization of the bowel between the transition point concerning for internal hernia or closed-loop obstruction.She underwent ex lap with LOA on 11/13 by Dr. Teresa.  Patient in room, family at bedside. Pt with NGT in place, currently on suction with output. Over past 24 hours, ~300 ml so far.  Pt awaiting bowel function following ex lap and LOA on 11/13. Pt has been NPO since admission on 11/8. Pt ate some [employer] on 11/7 and was vomiting on 11/8 AM. Prior to symptom onset, pt typically eats a large breakfast and then snacks throughout the day afterwards. For breakfast she may have pancakes with bacon, hashbrowns and fruit. She likes to snack on yogurt. Was drinking Ensure clear as well for calories and protein. Does not like milky supplements. Pt to start TPN today, will be at refeeding syndrome risk given NPO for 8 days now.  Recommend thiamine supplementation.   TPN to begin tonight at 40 ml/hr, providing 933 kcals and 40g protein.  Per patient, UBW ~116-120 lbs. Pt did lose weight when she began to eat more fruits and vegetables in her diet.  Medications: D5 in lactated ringers  infusion, KCl, K-Phos  Labs reviewed: Low potassium Low Phos  NUTRITION - FOCUSED PHYSICAL EXAM:  Flowsheet Row Most Recent Value  Orbital Region Moderate depletion  Upper Arm Region Moderate depletion  Thoracic and Lumbar Region Moderate depletion  Buccal Region Moderate depletion  Temple Region Moderate depletion  Clavicle Bone Region Severe depletion  Clavicle and Acromion Bone Region Severe depletion  Scapular Bone Region Severe depletion  Dorsal Hand Moderate depletion  Patellar Region Moderate depletion  Anterior Thigh Region Moderate depletion  Posterior Calf Region Moderate depletion  Edema (RD Assessment) None  Hair Reviewed  Eyes Reviewed  Mouth Reviewed  Skin Reviewed  Nails Reviewed    Diet Order:   Diet Order             Diet NPO time specified Except for: Sips with Meds, Ice Chips  Diet effective now                   EDUCATION NEEDS:   Education needs have been addressed  Skin:  Skin Assessment: Skin Integrity Issues: Skin Integrity Issues:: Incisions Incisions: 11/13 abdomen  Last BM:  11/7  Height:   Ht Readings from Last 1 Encounters:  03/31/24 5' 1 (1.549 m)    Weight:   Wt  Readings from Last 1 Encounters:  03/31/24 52.2 kg    BMI:  Body mass index is 21.73 kg/m.  Estimated Nutritional Needs:   Kcal:  1550-1750  Protein:  65-80g  Fluid:  1.7L/day   Morna Lee, MS, RD, LDN Inpatient Clinical Dietitian Contact via Secure chat

## 2024-04-06 ENCOUNTER — Inpatient Hospital Stay (HOSPITAL_COMMUNITY)

## 2024-04-06 DIAGNOSIS — N1831 Chronic kidney disease, stage 3a: Secondary | ICD-10-CM | POA: Diagnosis not present

## 2024-04-06 DIAGNOSIS — I2510 Atherosclerotic heart disease of native coronary artery without angina pectoris: Secondary | ICD-10-CM | POA: Diagnosis not present

## 2024-04-06 DIAGNOSIS — E782 Mixed hyperlipidemia: Secondary | ICD-10-CM | POA: Diagnosis not present

## 2024-04-06 DIAGNOSIS — K56609 Unspecified intestinal obstruction, unspecified as to partial versus complete obstruction: Secondary | ICD-10-CM | POA: Diagnosis not present

## 2024-04-06 LAB — GLUCOSE, CAPILLARY
Glucose-Capillary: 143 mg/dL — ABNORMAL HIGH (ref 70–99)
Glucose-Capillary: 119 mg/dL — ABNORMAL HIGH (ref 70–99)
Glucose-Capillary: 111 mg/dL — ABNORMAL HIGH (ref 70–99)

## 2024-04-06 LAB — CBC
HCT: 30.8 % — ABNORMAL LOW (ref 36.0–46.0)
Hemoglobin: 10.4 g/dL — ABNORMAL LOW (ref 12.0–15.0)
MCH: 31.2 pg (ref 26.0–34.0)
MCHC: 33.8 g/dL (ref 30.0–36.0)
MCV: 92.5 fL (ref 80.0–100.0)
Platelets: 301 K/uL (ref 150–400)
RBC: 3.33 MIL/uL — ABNORMAL LOW (ref 3.87–5.11)
RDW: 12.1 % (ref 11.5–15.5)
WBC: 11.9 K/uL — ABNORMAL HIGH (ref 4.0–10.5)
nRBC: 0 % (ref 0.0–0.2)

## 2024-04-06 LAB — BASIC METABOLIC PANEL WITH GFR
Anion gap: 7 (ref 5–15)
BUN: <5 mg/dL — ABNORMAL LOW (ref 8–23)
CO2: 31 mmol/L (ref 22–32)
Calcium: 8.8 mg/dL — ABNORMAL LOW (ref 8.9–10.3)
Chloride: 99 mmol/L (ref 98–111)
Creatinine, Ser: 0.63 mg/dL (ref 0.44–1.00)
GFR, Estimated: >60 mL/min (ref >60)
Glucose, Bld: 145 mg/dL — ABNORMAL HIGH (ref 70–99)
Potassium: 4 mmol/L (ref 3.5–5.1)
Sodium: 137 mmol/L (ref 135–145)

## 2024-04-06 LAB — MAGNESIUM: Magnesium: 1.9 mg/dL (ref 1.7–2.4)

## 2024-04-06 LAB — PHOSPHORUS: Phosphorus: 1.5 mg/dL — ABNORMAL LOW (ref 2.5–4.6)

## 2024-04-06 MED ORDER — POTASSIUM PHOSPHATES 15 MMOLE/5ML IV SOLN
30.0000 mmol | Freq: Once | INTRAVENOUS | Status: AC
  Administered 2024-04-06: 30 mmol via INTRAVENOUS
  Filled 2024-04-06: qty 10

## 2024-04-06 MED ORDER — MELATONIN 3 MG PO TABS
3.0000 mg | ORAL_TABLET | Freq: Every day | ORAL | Status: DC
  Administered 2024-04-06 – 2024-04-18 (×13): 3 mg via ORAL
  Filled 2024-04-06 (×13): qty 1

## 2024-04-06 MED ORDER — TRAVASOL 10 % IV SOLN
INTRAVENOUS | Status: AC
  Filled 2024-04-06: qty 705.6

## 2024-04-06 MED ORDER — LACTATED RINGERS IV SOLN: INTRAVENOUS | Status: AC

## 2024-04-06 MED ORDER — LACTATED RINGERS IV SOLN: INTRAVENOUS | Status: DC

## 2024-04-06 NOTE — Plan of Care (Signed)
  Problem: Elimination: Goal: Will not experience complications related to urinary retention Outcome: Progressing   Problem: Pain Managment: Goal: General experience of comfort will improve and/or be controlled Outcome: Progressing

## 2024-04-06 NOTE — Progress Notes (Signed)
 PROGRESS NOTE  Dominique Mooney FMW:[id number] DOB: 06-27-1950   PCP: Emerick Avelina POUR, PA-C  Patient is from: Home.  Lives alone.  Family checks on her every night.  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  CT abdomen and pelvis showed SBO with 2 adjacent transition points in RLQ and fecalization of the bowel between the transition point concerning for internal hernia or closed-loop obstruction.  General surgery consulted and admitted.  Initially managed conservatively with NG tube decompression.  SBO persisted.  She underwent ex lap with LOA on 11/13 by Dr. Pizza.  Remains n.p.o. with NG tube pending return of bowel function.  Started TPN on 11/18.  Subjective: Seen and examined earlier this morning.  No major events overnight or this morning.  No complaints but not a great historian.  She denies pain.   Assessment and plan: Small bowel obstruction: Presents with N/V and pain.  CT showed SBO with adjacent transition points in the RLQ, concerning for internal hernia or closed-loop obstruction.  SBO persisted despite NG tube decompression. -S/p ex lap and LOA by Dr. Pizza on 11/13. -NG tube pending return of bowel function. -Started TPN on 11/18. -Follow KUB. -Mobilize patient.  Memory impairment/cognitive impairment: Seems to be at baseline.  MRI brain without acute intracranial abnormality or reversible cause of memory loss. - Reorientation and delirium precaution   Hypothyroidism:TSH was 41.7 on 7/22 secondary to medication nonadherence, improved to 7.73 on recheck 9/23. Synthroid  on hold while NPO.   - Continue IV Synthroid  50 mcg daily.    Hyperlipidemia:Not on statin due to history of drug-induced liver injury.   Nonobstructive CAD: Stable.  Not on statin due to history of drug-induced liver injury.   Essential hypertension: Not on meds at home.  Normotensive.  Mild intermittent asthma: Stable -Bronchodilators as needed  Hypokalemia/hypophosphatemia -Monitor replenish with TPN.  Hyponatremia: Mild - Monitor  CKD-3A: Stable -Continue monitoring  Leukocytosis: Resolving without antibiotics. -Continue monitoring.   Severe malnutrition Body mass index is 22.37 kg/m. Nutrition Problem: Severe Malnutrition Etiology: acute illness (SBO) Signs/Symptoms: severe muscle depletion, moderate fat depletion, energy intake < 75% for > 7 days Interventions: TPN   DVT prophylaxis:  enoxaparin  (LOVENOX ) injection 40 mg Start: 04/06/24 1200 SCDs Start: 03/27/24 2136  Code Status: Full code Family Communication: None at bedside today. Level of care: Med-Surg Status is: Inpatient Remains inpatient appropriate because: SBO   Final disposition: Home   35 minutes with more than 50% spent in reviewing records, counseling patient/family and coordinating care.  Consultants:  General Surgery  Procedures: 11/13-ex lap and LOA by Dr. Pizza  Microbiology summarized: None  Objective: Vitals:   04/05/24 1317 04/05/24 2045 04/06/24 0416 04/06/24 0500  BP: 118/80 137/85 134/89   Pulse: 81 85 86   Resp: 18 17 17    Temp: 99 F (37.2 C) 99.8 F (37.7 C) 99.1 F (37.3 C)   TempSrc: Oral Oral Oral   SpO2: 98% 99% 96%   Weight:    53.7 kg  Height:        Examination:  GENERAL: No apparent distress.  Nontoxic. HEENT: MMM.  Vision and hearing grossly intact.  NG tube  in place. NECK: Supple.  No apparent JVD.  RESP:  No IWOB.  Fair aeration bilaterally. CVS:  RRR. Heart sounds normal.  ABD/GI/GU: BS+. Abd soft.  Honeycomb dressing in place. MSK/EXT:  Moves extremities. No apparent deformity. No edema.  SKIN: As  above. NEURO: AA.  Oriented to self, place and family.  No apparent focal neuro deficit. PSYCH: Calm. Normal affect.   Sch Meds:  Scheduled Meds:  Chlorhexidine  Gluconate Cloth  6 each Topical Daily   enoxaparin  (LOVENOX ) injection  40 mg Subcutaneous Q24H   insulin aspart  0-9 Units Subcutaneous Q6H   levothyroxine   50 mcg Intravenous Daily   thiamine (VITAMIN B1) injection  100 mg Intravenous Daily   Continuous Infusions:  lactated ringers  85 mL/hr at 04/06/24 1035   lactated ringers      potassium PHOSPHATE IVPB (in mmol) 30 mmol (04/06/24 1040)   TPN ADULT (ION) 40 mL/hr at 04/05/24 1907   TPN ADULT (ION)     PRN Meds:.albuterol , hydrALAZINE, HYDROmorphone  (DILAUDID ) injection, menthol, methocarbamol  (ROBAXIN ) injection, ondansetron  (ZOFRAN ) IV, phenol, sodium chloride  flush  Antimicrobials: Anti-infectives (From admission, onward)    Start     Dose/Rate Route Frequency Ordered Stop   04/01/24 0800  cefTRIAXone (ROCEPHIN) 2 g in sodium chloride  0.9 % 100 mL IVPB  Status:  Discontinued        2 g 200 mL/hr over 30 Minutes Intravenous On call to O.R. 04/01/24 0700 04/01/24 0731   04/01/24 0730  cefoTEtan (CEFOTAN) 2 g in sodium chloride  0.9 % 100 mL IVPB        2 g 200 mL/hr over 30 Minutes Intravenous On call to O.R. 04/01/24 0640 04/01/24 1924   03/31/24 1300  cefoTEtan (CEFOTAN) 2 g in sodium chloride  0.9 % 100 mL IVPB  Status:  Discontinued        2 g 200 mL/hr over 30 Minutes Intravenous On call to O.R. 03/31/24 0909 03/31/24 1601   03/31/24 0945  cefTRIAXone (ROCEPHIN) 2 g in sodium chloride  0.9 % 100 mL IVPB  Status:  Discontinued        2 g 200 mL/hr over 30 Minutes Intravenous On call to O.R. 03/31/24 0854 03/31/24 0909        I have personally reviewed the following labs and images: CBC: Recent Labs  Lab 04/01/24 0521 04/03/24 0452 04/04/24 0509 04/05/24 0455 04/06/24 0315  WBC 8.9 13.9* 16.8* 12.8* 11.9*  NEUTROABS  --   --   --  9.8*  --   HGB 13.3  10.6* 10.4* 10.9* 10.4*  HCT 37.9 30.3* 30.5* 32.9* 30.8*  MCV 89.2 90.4 91.0 94.0 92.5  PLT 235 229 275 308 301   BMP &GFR Recent Labs  Lab 03/31/24 0516 04/01/24 0521 04/03/24 0452 04/04/24 0509 04/05/24 0455 04/06/24 0315  NA 133* 132* 134* 135 138 137  K 3.7 3.6 3.7 3.5 3.1* 4.0  CL 94* 92* 95* 95* 97* 99  CO2 27 25 28 27 31 31   GLUCOSE 82 77 76 66* 126* 145*  BUN 11 11 23 14  6* <5*  CREATININE 0.72 0.68 0.89 0.73 0.70 0.63  CALCIUM  9.3 9.1 8.5* 8.3* 8.5* 8.8*  MG 2.0 2.0  --   --  2.1 1.9  PHOS 2.8 2.6  --   --  1.2* 1.5*   Estimated Creatinine Clearance: 48.7 mL/min (by C-G formula based on SCr of 0.63 mg/dL). Liver & Pancreas: Recent Labs  Lab 03/31/24 0516 04/01/24 0521 04/05/24 0455  AST  --   --  27  ALT  --   --  <5  ALKPHOS  --   --  74  BILITOT  --   --  0.5  PROT  --   --  6.2*  ALBUMIN 3.9 3.7 2.9*   No results for input(s): LIPASE, AMYLASE in the last 168 hours.  No results for input(s): AMMONIA in the last 168 hours. Diabetic: No results for input(s): HGBA1C in the last 72 hours. Recent Labs  Lab 04/05/24 1745 04/05/24 2345 04/06/24 0612 04/06/24 1133  GLUCAP 113* 137* 143* 119*   Cardiac Enzymes: No results for input(s): CKTOTAL, CKMB, CKMBINDEX, TROPONINI in the last 168 hours. No results for input(s): PROBNP in the last 8760 hours. Coagulation Profile: No results for input(s): INR, PROTIME in the last 168 hours. Thyroid  Function Tests: No results for input(s): TSH, T4TOTAL, FREET4, T3FREE, THYROIDAB in the last 72 hours. Lipid Profile: No results for input(s): CHOL, HDL, LDLCALC, TRIG, CHOLHDL, LDLDIRECT in the last 72 hours. Anemia Panel: No results for input(s): VITAMINB12, FOLATE, FERRITIN, TIBC, IRON, RETICCTPCT in the last 72 hours. Urine analysis:    Component Value Date/Time   COLORURINE STRAW (A) 03/27/2024 2012   APPEARANCEUR CLEAR 03/27/2024 2012   LABSPEC 1.013  03/27/2024 2012   PHURINE 7.0 03/27/2024 2012   GLUCOSEU NEGATIVE 03/27/2024 2012   GLUCOSEU NEGATIVE 07/23/2010 1623   HGBUR NEGATIVE 03/27/2024 2012   BILIRUBINUR NEGATIVE 03/27/2024 2012   KETONESUR NEGATIVE 03/27/2024 2012   PROTEINUR NEGATIVE 03/27/2024 2012   UROBILINOGEN 0.2 07/23/2010 1623   NITRITE NEGATIVE 03/27/2024 2012   LEUKOCYTESUR NEGATIVE 03/27/2024 2012   Sepsis Labs: Invalid input(s): PROCALCITONIN, LACTICIDVEN  Microbiology: No results found for this or any previous visit (from the past 240 hours).  Radiology Studies: No results found.      Aayan Haskew T. Aaliyah Cancro [facility]  If 7PM-7AM, please contact night-coverage [email or web address] 04/06/2024, 12:20 PM

## 2024-04-06 NOTE — Progress Notes (Signed)
 PHARMACY - TOTAL PARENTERAL NUTRITION CONSULT NOTE   Indication: Prolonged ileus  Patient Measurements: Height: 5' 1 (154.9 cm) Weight: 53.7 kg (118 lb 6.2 oz) IBW/kg (Calculated) : 47.8 TPN AdjBW (KG): 52.2 Body mass index is 22.37 kg/m. Usual Weight:   Assessment: Patient is a 73 y.o F who presented to the ED on 03/27/24 with c/o abdominal pain and n/v. Abdominal CT on 03/27/24 showed SBO.   She subsequently underwent exp lap with small bowel resection, lysis of adhesions and enterorrhaphy on 04/01/24.  Pharmacy has been consulted on 04/05/24 to start TPN for prolonged ileus.  Glucose / Insulin: no hx DM - CBGs: 113-143   (goal range 140-180 mg/dL) - SSI / 24 hrs: 2 units Electrolytes: Phos low at 1.5, others WNL including K 4, CorrCa 9.7 - goal for ileus: Mag >2 and K >4 Renal: scr <1, BUN < 5 Hepatic: LFTs wnl - albumin low 2.9 - prealbumin low 6 Intake / Output; MIVF:   - IVF @ 85 ml/hr - I/O: + 616 mL (output: urine 3.5 L, NG 1L) GI Imaging: GI Surgeries / Procedures:  - 11/9: unsuccessful  placement of NG tube - 11/10: NGT - 11/13: exp lap with small bowel resection, lysis of adhesions and enterorrhaphy.   Central access: PICC 11/17 TPN start date: 04/05/24   Nutritional Goals: Goal TPN rate is 70 mL/hr (provides 70 g of protein and 1632 kcals per day)  RD Assessment: Estimated Needs Total Energy Estimated Needs: 1550-1750 Total Protein Estimated Needs: 65-80g Total Fluid Estimated Needs: 1.7L/day - Pt is at risk for refeeding, recommend Thiamine for 5 days.   Current Nutrition:  - NPO - TPN   Plan:  Now: KPhos 30 mmol IV x1 dose  At 18:00 Increase TPN to goal rate 70 ml/hr - Electrolytes in TPN:  Na 59mEq/L K 50mEq/L Ca 61mEq/L Mg 10 mEq/L  Phos 20 mmol/L.  Cl:Ac 1:1 - Add standard MVI and trace elements to TPN - Continue Sensitive q6h SSI and adjust as needed  - Change mIVF to plain LR.  Reduce MIVF to 55 mL/hr at 1800 - Thiamine 100 mg IV  daily x 5 days (11/17 thru 11/21) - Bmp, Mag, phos daily thru 11/20 - Monitor TPN labs on Mon/Thurs, at a minimum    Wanda Hasting PharmD, BCPS [facility] main pharmacy [phone] 04/06/2024 7:45 AM

## 2024-04-06 NOTE — Progress Notes (Signed)
 5 Days Post-Op  Subjective: Patient queries some flatus, but no BM.  Difficult to tell due to forgetfulness.  Having throat pain this morning.  Seen with bedside RN  Objective: Vital signs in last 24 hours: Temp:  [99 F (37.2 C)-99.8 F (37.7 C)] 99.1 F (37.3 C) (11/18 0416) Pulse Rate:  [81-86] 86 (11/18 0416) Resp:  [17-18] 17 (11/18 0416) BP: (118-137)/(80-89) 134/89 (11/18 0416) SpO2:  [96 %-99 %] 96 % (11/18 0416) Weight:  [53.7 kg] 53.7 kg (11/18 0500) Last BM Date : 03/26/24  Intake/Output from previous day: 11/17 0701 - 11/18 0700 In: 3726.4 [I.V.:2963.7; IV Piggyback:762.8] Out: 4050 [Urine:3050; Emesis/NG output:1000] Intake/Output this shift: No intake/output data recorded.  PE: Gen: NAD HEENT: NGT in place with bilious output, 100cc noted yesterday Abd: soft, appropriately tender, ND, incision c/d/I with staples   Lab Results:  Recent Labs    04/05/24 0455 04/06/24 0315  WBC 12.8* 11.9*  HGB 10.9* 10.4*  HCT 32.9* 30.8*  PLT 308 301   BMET Recent Labs    04/05/24 0455 04/06/24 0315  NA 138 137  K 3.1* 4.0  CL 97* 99  CO2 31 31  GLUCOSE 126* 145*  BUN 6* <5*  CREATININE 0.70 0.63  CALCIUM  8.5* 8.8*   PT/INR No results for input(s): LABPROT, INR in the last 72 hours. CMP     Component Value Date/Time   NA 137 04/06/2024 0315   NA 142 08/09/2022 0913   K 4.0 04/06/2024 0315   CL 99 04/06/2024 0315   CO2 31 04/06/2024 0315   GLUCOSE 145 (H) 04/06/2024 0315   BUN <5 (L) 04/06/2024 0315   BUN 9 08/09/2022 0913   CREATININE 0.63 04/06/2024 0315   CALCIUM  8.8 (L) 04/06/2024 0315   PROT 6.2 (L) 04/05/2024 0455   PROT 6.6 08/09/2022 0913   ALBUMIN 2.9 (L) 04/05/2024 0455   ALBUMIN 4.0 08/09/2022 0913   AST 27 04/05/2024 0455   ALT <5 04/05/2024 0455   ALKPHOS 74 04/05/2024 0455   BILITOT 0.5 04/05/2024 0455   BILITOT 0.7 08/09/2022 0913   GFRNONAA >60 04/06/2024 0315   GFRAA >60 12/31/2019 1156   Lipase     Component  Value Date/Time   LIPASE 36 03/27/2024 1833       Studies/Results: US  EKG SITE RITE Result Date: 04/05/2024 If Site Rite image not attached, placement could not be confirmed due to current cardiac rhythm.  MR BRAIN WO CONTRAST Result Date: 04/04/2024 EXAM: MRI BRAIN WITHOUT CONTRAST 04/04/2024 02:15:58 PM TECHNIQUE: Multiplanar multisequence MRI of the head/brain was performed without the administration of intravenous contrast. COMPARISON: CT head 08/17/2021 CLINICAL HISTORY: Dementia, vascular etiology suspected; MRI evaluation for dementia. FINDINGS: BRAIN AND VENTRICLES: No acute infarct. No intracranial hemorrhage. No mass. No midline shift. No hydrocephalus. The sella is unremarkable. Normal flow voids. Mild T2 hyperintensities in the [place] matter are compatible with mild chronic microvascular ischemic change. ORBITS: No acute abnormality. SINUSES AND MASTOIDS: No acute abnormality. BONES AND SOFT TISSUES: Normal marrow signal. No acute soft tissue abnormality. IMPRESSION: 1. No acute intracranial abnormality or reversible cause of memory loss . Electronically signed by: Gilmore Molt MD 04/04/2024 03:33 PM EST RP Workstation: [id number]     Anti-infectives: Anti-infectives (From admission, onward)    Start     Dose/Rate Route Frequency Ordered Stop   04/01/24 0800  cefTRIAXone (ROCEPHIN) 2 g in sodium chloride  0.9 % 100 mL IVPB  Status:  Discontinued  2 g 200 mL/hr over 30 Minutes Intravenous On call to O.R. 04/01/24 0700 04/01/24 0731   04/01/24 0730  cefoTEtan (CEFOTAN) 2 g in sodium chloride  0.9 % 100 mL IVPB        2 g 200 mL/hr over 30 Minutes Intravenous On call to O.R. 04/01/24 0640 04/01/24 1924   03/31/24 1300  cefoTEtan (CEFOTAN) 2 g in sodium chloride  0.9 % 100 mL IVPB  Status:  Discontinued        2 g 200 mL/hr over 30 Minutes Intravenous On call to O.R. 03/31/24 0909 03/31/24 1601   03/31/24 0945  cefTRIAXone (ROCEPHIN) 2 g in sodium chloride  0.9 % 100 mL  IVPB  Status:  Discontinued        2 g 200 mL/hr over 30 Minutes Intravenous On call to O.R. 03/31/24 0854 03/31/24 0909        Assessment/Plan POD 5, s/p ex lap with LOA and SBR for SBO, Dr. Teresa 11/13 -cont NGT and await bowel function -given prolonged NPO status, picc/TNA.  -obtain plain film to check status of ileus given patient unable to provide a good history -NG with high output .  K improved today -mobilize, pulm toilet -multi-modal pain control -labs in am -discussed plan with primary service   FEN - NPO/NGT/IVFs/PICC/TNA VTE - Lovenox  ID - rocephin on call to OR, no further needed    LOS: 10 days   Burnard FORBES Banter, [facility]

## 2024-04-07 DIAGNOSIS — I2510 Atherosclerotic heart disease of native coronary artery without angina pectoris: Secondary | ICD-10-CM | POA: Diagnosis not present

## 2024-04-07 DIAGNOSIS — N1831 Chronic kidney disease, stage 3a: Secondary | ICD-10-CM | POA: Diagnosis not present

## 2024-04-07 DIAGNOSIS — E782 Mixed hyperlipidemia: Secondary | ICD-10-CM | POA: Diagnosis not present

## 2024-04-07 DIAGNOSIS — K56609 Unspecified intestinal obstruction, unspecified as to partial versus complete obstruction: Secondary | ICD-10-CM | POA: Diagnosis not present

## 2024-04-07 LAB — BASIC METABOLIC PANEL WITH GFR
Anion gap: 9 (ref 5–15)
BUN: 6 mg/dL — ABNORMAL LOW (ref 8–23)
CO2: 29 mmol/L (ref 22–32)
Calcium: 9.1 mg/dL (ref 8.9–10.3)
Chloride: 97 mmol/L — ABNORMAL LOW (ref 98–111)
Creatinine, Ser: 0.57 mg/dL (ref 0.44–1.00)
GFR, Estimated: >60 mL/min (ref >60)
Glucose, Bld: 131 mg/dL — ABNORMAL HIGH (ref 70–99)
Potassium: 4.2 mmol/L (ref 3.5–5.1)
Sodium: 135 mmol/L (ref 135–145)

## 2024-04-07 LAB — GLUCOSE, CAPILLARY
Glucose-Capillary: 118 mg/dL — ABNORMAL HIGH (ref 70–99)
Glucose-Capillary: 127 mg/dL — ABNORMAL HIGH (ref 70–99)
Glucose-Capillary: 138 mg/dL — ABNORMAL HIGH (ref 70–99)
Glucose-Capillary: 143 mg/dL — ABNORMAL HIGH (ref 70–99)

## 2024-04-07 LAB — PHOSPHORUS: Phosphorus: 3.3 mg/dL (ref 2.5–4.6)

## 2024-04-07 LAB — MAGNESIUM: Magnesium: 2.1 mg/dL (ref 1.7–2.4)

## 2024-04-07 MED ORDER — TRAVASOL 10 % IV SOLN
INTRAVENOUS | Status: AC
  Filled 2024-04-07: qty 705.6

## 2024-04-07 NOTE — Evaluation (Signed)
 Physical Therapy Evaluation Patient Details Name: Dominique Mooney MRN: [id number] DOB: 15-Oct-1950 Today's Date: 04/07/2024  History of Present Illness  Dominique Mooney is a 73 yo female, s/p ex lap, lysis of adhesions and enterorrhapy (repair of small bowel serosa) 11/13 for SBO. PMH: nonobstructive CAD, CKD-3A, memory loss,  DILI due to statin, post ERCP pancreatitis in 2000, asthma, HTN and HLD  Clinical Impression  Pt admitted with above diagnosis. PTA, pt ind without AD, lives in single level home with 2 steps to enter, has equipment from spouse who passed away. On eval, pt with generalized weakness, good sensation in BLE, requiring increased time and cues for mobility, RN recently clamped NG tube. On eval, pt cued for log roll for bed mobility, needing CGA to power up into sitting at bedside. Pt needing CGA for transfers and gait to/from restroom using RW. Pt significantly limited by nausea complaints; VSS on RA once back in bed. Notified RN of vitals and nausea. Anticipate progress while in hospital, recommend HHPT at d/c with family assist as needed. Pt currently with functional limitations due to the deficits listed below (see PT Problem List). Pt will benefit from acute skilled PT to increase their independence and safety with mobility to allow discharge.           If plan is discharge home, recommend the following: A little help with walking and/or transfers;A little help with bathing/dressing/bathroom;Assistance with cooking/housework;Help with stairs or ramp for entrance;Assist for transportation   Can travel by private vehicle        Equipment Recommendations None recommended by PT  Recommendations for Other Services       Functional Status Assessment Patient has had a recent decline in their functional status and demonstrates the ability to make significant improvements in function in a reasonable and predictable amount of time.     Precautions / Restrictions  Precautions Precautions: Fall Precaution/Restrictions Comments: abdominal surgery, NG, TPN Restrictions Weight Bearing Restrictions Per Provider Order: No      Mobility  Bed Mobility Overal bed mobility: Needs Assistance Bed Mobility: Rolling, Sidelying to Sit, Sit to Sidelying Rolling: Supervision Sidelying to sit: Contact guard assist     Sit to sidelying: Contact guard assist General bed mobility comments: supv to roll onto side, CGA to power up to sitting and return back to sidelying, increased time and effort    Transfers Overall transfer level: Needs assistance Equipment used: Rolling walker (2 wheels) Transfers: Sit to/from Stand Sit to Stand: Contact guard assist           General transfer comment: CGA for steadying to power up to stand from bedside and toilet    Ambulation/Gait Ambulation/Gait assistance: Contact guard assist Gait Distance (Feet): 10 Feet (x2) Assistive device: Rolling walker (2 wheels) Gait Pattern/deviations: Step-to pattern, Decreased stride length, Trunk flexed, Narrow base of support Gait velocity: decreased     General Gait Details: slow, near shuffling gait pattern with short steps and decreased cadence, forward flexed trunk appears to be for abdominal comfort and due to nausea, limited by nausea - notified RN  Stairs            Wheelchair Mobility     Tilt Bed    Modified Rankin (Stroke Patients Only)       Balance Overall balance assessment: Needs assistance Sitting-balance support: Feet supported Sitting balance-Leahy Scale: Fair Sitting balance - Comments: not challenged   Standing balance support: Reliant on assistive device for balance, During functional activity, Bilateral upper  extremity supported Standing balance-Leahy Scale: Poor                               Pertinent Vitals/Pain Pain Assessment Pain Assessment: Faces Faces Pain Scale: Hurts even more Pain Location: abdomen Pain  Descriptors / Indicators:  (nausea) Pain Intervention(s): Limited activity within patient's tolerance, Monitored during session, Other (comment) (notified RN regarding nausea)    Home Living Family/patient expects to be discharged to:: Private residence Living Arrangements: Children Available Help at Discharge: Family Type of Home: House Home Access: Stairs to enter Entrance Stairs-Rails: Doctor, General Practice of Steps: 2   Home Layout: One level Home Equipment: Shower seat - built in;Rollator (4 wheels);Rolling Walker (2 wheels);BSC/3in1 Additional Comments: per daughter, family seeking ALF    Prior Function Prior Level of Function : Independent/Modified Independent             Mobility Comments: pt reports ind without AD ADLs Comments: pt reports ind with household chores and self care without AD     Extremity/Trunk Assessment   Upper Extremity Assessment Upper Extremity Assessment: Overall WFL for tasks assessed    Lower Extremity Assessment Lower Extremity Assessment: Generalized weakness (AROM WFL, strength grossly 3+/5, denies numbness/tingling)    Cervical / Trunk Assessment Cervical / Trunk Assessment: Kyphotic (appears to be for comfort)  Communication   Communication Communication: No apparent difficulties    Cognition Arousal: Alert Behavior During Therapy: WFL for tasks assessed/performed                           PT - Cognition Comments: pt states name, DOB, location appropriate; pt reports situation becuase I was having issues and unable to state current date or current president's name Following commands: Impaired Following commands impaired: Follows one step commands with increased time     Cueing Cueing Techniques: Verbal cues, Gestural cues, Tactile cues, Visual cues     General Comments General comments (skin integrity, edema, etc.): BP 156/95, HR 92 and SpO2 99% on RA post amb to restroom and back to bed     Exercises     Assessment/Plan    PT Assessment Patient needs continued PT services  PT Problem List Decreased strength;Decreased activity tolerance;Decreased balance;Decreased cognition;Decreased safety awareness;Decreased knowledge of precautions;Pain       PT Treatment Interventions DME instruction;Gait training;Stair training;Functional mobility training;Therapeutic activities;Therapeutic exercise;Balance training;Neuromuscular re-education;Patient/family education    PT Goals (Current goals can be found in the Care Plan section)  Acute Rehab PT Goals Patient Stated Goal: none stated PT Goal Formulation: With patient/family Time For Goal Achievement: 04/21/24 Potential to Achieve Goals: Good    Frequency Min 3X/week     Co-evaluation               AM-PAC PT 6 Clicks Mobility  Outcome Measure Help needed turning from your back to your side while in a flat bed without using bedrails?: A Little Help needed moving from lying on your back to sitting on the side of a flat bed without using bedrails?: A Little Help needed moving to and from a bed to a chair (including a wheelchair)?: A Little Help needed standing up from a chair using your arms (e.g., wheelchair or bedside chair)?: A Little Help needed to walk in hospital room?: A Little Help needed climbing 3-5 steps with a railing? : A Lot 6 Click Score: 17    End of  Session Equipment Utilized During Treatment: Gait belt Activity Tolerance: Other (comment) (limited by nausea) Patient left: in bed;with bed alarm set;with call bell/phone within reach;with family/visitor present Nurse Communication: Mobility status;Other (comment) (nausea, vitals) PT Visit Diagnosis: Muscle weakness (generalized) (M62.81);Pain Pain - Right/Left:  (abdominal)    Time: 8892-8871 PT Time Calculation (min) (ACUTE ONLY): 21 min   Charges:   PT Evaluation $PT Eval Moderate Complexity: 1 Mod   PT General Charges $$ ACUTE PT VISIT: 1  Visit         Tori Yamile Roedl PT, DPT 04/07/24, 3:10 PM

## 2024-04-07 NOTE — TOC Progression Note (Signed)
 Transition of Care [facility]) - Progression Note    Patient Details  Name: Dominique Mooney MRN: [id number] Date of Birth: 01-Jul-1950  [facility] CM/SW Contact  NORMAN ASPEN, LCSW Phone Number: 04/07/2024, 10:56 AM  Clinical Narrative:     IP CM continues to follow along for any potential dc needs.      Expected Discharge Plan and Services                                               Social Drivers of Health (SDOH) Interventions SDOH Screenings   Food Insecurity: No Food Insecurity (03/27/2024)  Housing: High Risk (03/27/2024)  Transportation Needs: No Transportation Needs (03/27/2024)  Utilities: Not At Risk (03/27/2024)  Social Connections: Unknown (03/27/2024)  Tobacco Use: Low Risk  (04/01/2024)    Readmission Risk Interventions    03/30/2024    4:16 PM  Readmission Risk Prevention Plan  Post Dischage Appt Complete  Medication Screening Complete  Transportation Screening Complete

## 2024-04-07 NOTE — Progress Notes (Signed)
 6 Days Post-Op  Subjective: Patient denies flatus or BM, but forgetful so difficult to tell if she remembers flatus.  Daughter at bedside today.  No new complaints.  Pain minimal  Objective: Vital signs in last 24 hours: Temp:  [98.6 F (37 C)-99.1 F (37.3 C)] 98.7 F (37.1 C) (11/19 0814) Pulse Rate:  [82-89] 89 (11/19 0814) Resp:  [16-18] 18 (11/19 0814) BP: (130-151)/(83-93) 139/93 (11/19 0814) SpO2:  [97 %-100 %] 98 % (11/19 0814) Weight:  [53 kg] 53 kg (11/19 0355) Last BM Date : 03/26/24 (no BM charted on 11/18)  Intake/Output from previous day: 11/18 0701 - 11/19 0700 In: 3199.1 [P.O.:30; I.V.:2469.2; NG/GT:190; IV Piggyback:510] Out: 3550 [Urine:3050; Emesis/NG output:500] Intake/Output this shift: Total I/O In: -  Out: 300 [Urine:200; Emesis/NG output:100]  PE: Gen: NAD HEENT: NGT in place with bilious output, 500cc noted yesterday Abd: soft, appropriately tender, ND, incision c/d/I with staples   Lab Results:  Recent Labs    04/05/24 0455 04/06/24 0315  WBC 12.8* 11.9*  HGB 10.9* 10.4*  HCT 32.9* 30.8*  PLT 308 301   BMET Recent Labs    04/06/24 0315 04/07/24 0254  NA 137 135  K 4.0 4.2  CL 99 97*  CO2 31 29  GLUCOSE 145* 131*  BUN <5* 6*  CREATININE 0.63 0.57  CALCIUM  8.8* 9.1   PT/INR No results for input(s): LABPROT, INR in the last 72 hours. CMP     Component Value Date/Time   NA 135 04/07/2024 0254   NA 142 08/09/2022 0913   K 4.2 04/07/2024 0254   CL 97 (L) 04/07/2024 0254   CO2 29 04/07/2024 0254   GLUCOSE 131 (H) 04/07/2024 0254   BUN 6 (L) 04/07/2024 0254   BUN 9 08/09/2022 0913   CREATININE 0.57 04/07/2024 0254   CALCIUM  9.1 04/07/2024 0254   PROT 6.2 (L) 04/05/2024 0455   PROT 6.6 08/09/2022 0913   ALBUMIN 2.9 (L) 04/05/2024 0455   ALBUMIN 4.0 08/09/2022 0913   AST 27 04/05/2024 0455   ALT <5 04/05/2024 0455   ALKPHOS 74 04/05/2024 0455   BILITOT 0.5 04/05/2024 0455   BILITOT 0.7 08/09/2022 0913   GFRNONAA  >60 04/07/2024 0254   GFRAA >60 12/31/2019 1156   Lipase     Component Value Date/Time   LIPASE 36 03/27/2024 1833       Studies/Results: DG Abd Portable 1V Result Date: 04/06/2024 EXAM: 1 VIEW XRAY OF THE ABDOMEN 04/06/2024 10:33:00 AM COMPARISON: 04/01/2024 CLINICAL HISTORY: Ileus (HCC) [id number] FINDINGS: LINES, TUBES AND DEVICES: Enteric tube in place, terminating within the gastric body. BOWEL: Reduced small bowel dilatation with only a mildly dilated left upper quadrant small bowel loop currently identified. Small amount of contrast in the proximal colon. SOFT TISSUES: Cholecystectomy clips in right upper quadrant noted. Midline surgical staples noted. No opaque urinary calculi. BONES: No acute osseous abnormality. LUNGS: Suspected subsegmental atelectasis in the lung bases. IMPRESSION: 1. Reduced small bowel dilatation with only a mildly dilated left upper quadrant small bowel loop currently identified. 2. Small amount of contrast in the proximal colon. 3. Suspected subsegmental atelectasis in the lung bases. Electronically signed by: Ryan Salvage MD 04/06/2024 02:30 PM EST RP Workstation: [id number]   US  EKG SITE RITE Result Date: 04/05/2024 If Site Rite image not attached, placement could not be confirmed due to current cardiac rhythm.    Anti-infectives: Anti-infectives (From admission, onward)    Start     Dose/Rate Route Frequency Ordered Stop  04/01/24 0800  cefTRIAXone (ROCEPHIN) 2 g in sodium chloride  0.9 % 100 mL IVPB  Status:  Discontinued        2 g 200 mL/hr over 30 Minutes Intravenous On call to O.R. 04/01/24 0700 04/01/24 0731   04/01/24 0730  cefoTEtan (CEFOTAN) 2 g in sodium chloride  0.9 % 100 mL IVPB        2 g 200 mL/hr over 30 Minutes Intravenous On call to O.R. 04/01/24 0640 04/01/24 1924   03/31/24 1300  cefoTEtan (CEFOTAN) 2 g in sodium chloride  0.9 % 100 mL IVPB  Status:  Discontinued        2 g 200 mL/hr over 30 Minutes Intravenous On call to O.R.  03/31/24 0909 03/31/24 1601   03/31/24 0945  cefTRIAXone (ROCEPHIN) 2 g in sodium chloride  0.9 % 100 mL IVPB  Status:  Discontinued        2 g 200 mL/hr over 30 Minutes Intravenous On call to O.R. 03/31/24 0854 03/31/24 0909        Assessment/Plan POD 6, s/p ex lap with LOA and SBR for SBO, Dr. Teresa 11/13 -NGT output down from 1000cc to 500cc and abdomen soft and ND.  Will try clamping her NGT today and see how she does.  Can return to suction if develops any issues -given prolonged NPO status, picc/TNA.  -mobilize, pulm toilet -multi-modal pain control -K 4.2 today -discussed plan with daughter at bedside   FEN - NPO/NGT/IVFs/PICC/TNA VTE - Lovenox  ID - rocephin on call to OR, no further needed    LOS: 11 days   Burnard FORBES Banter, [facility]

## 2024-04-07 NOTE — Progress Notes (Signed)
 PHARMACY - TOTAL PARENTERAL NUTRITION CONSULT NOTE   Indication: Prolonged ileus  Patient Measurements: Height: 5' 1 (154.9 cm) Weight: 53 kg (116 lb 13.5 oz) IBW/kg (Calculated) : 47.8 TPN AdjBW (KG): 52.2 Body mass index is 22.08 kg/m. Usual Weight:   Assessment: Patient is a 73 y.o F who presented to the ED on 03/27/24 with c/o abdominal pain and n/v. Abdominal CT on 03/27/24 showed SBO.   She subsequently underwent exp lap with small bowel resection, lysis of adhesions and enterorrhaphy on 04/01/24.  Pharmacy has been consulted on 04/05/24 to start TPN for prolonged ileus.  Glucose / Insulin: no hx DM.  CBGs: 111-143   (goal range 140-180 mg/dL) - SSI / 24 hrs: 2 units Electrolytes: WNL except low Cl, CorrCa 9.98 - goal for ileus: Mag >2 and K >4 Renal: Scr <1, BUN 6 Hepatic: LFTs wnl - albumin low 2.9, prealbumin low 6 Intake / Output; MIVF:   - mIVF LR @ 55 ml/hr - Output: urine 3.5 L, NG .  Net I/O - GI Imaging: 11/18 Abd xray: Reduced small bowel dilatation  GI Surgeries / Procedures:  - 11/9: unsuccessful  placement of NG tube - 11/10: NGT - 11/13: exp lap with small bowel resection, lysis of adhesions and enterorrhaphy.   Central access: PICC 11/17 TPN start date: 04/05/24   Nutritional Goals: Goal TPN rate is 70 mL/hr (provides 70 g of protein and 1632 kcals per day)  RD Assessment: Estimated Needs Total Energy Estimated Needs: 1550-1750 Total Protein Estimated Needs: 65-80g Total Fluid Estimated Needs: 1.7L/day  Current Nutrition:  - NPO, TPN - NG tube to LIWS  Plan:  At 18:00 Continue TPN at goal rate 70 ml/hr - Electrolytes in TPN:  Na 70 mEq/L K 50 mEq/L Ca 2.5 mEq/L Mg 10 mEq/L  Phos 20 mmol/L Cl:Ac 2:1 - Add standard MVI and trace elements to TPN - Continue Sensitive q6h SSI and adjust as needed  - Continue mIVF of LR at 55 mL/hr - Thiamine 100 mg IV daily x 5 days (11/17 - 11/21) - Monitor TPN labs on Mon/Thurs and  PRN   Wanda Hasting PharmD, BCPS [facility] main pharmacy [phone] 04/07/2024 7:37 AM

## 2024-04-07 NOTE — Progress Notes (Signed)
 PROGRESS NOTE  Dominique Mooney FMW:[id number] DOB: Aug 13, 1950   PCP: Emerick Avelina POUR, PA-C  Patient is from: Home.  Lives alone.  Family checks on her every night.  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  CT abdomen and pelvis showed SBO with 2 adjacent transition points in RLQ and fecalization of the bowel between the transition point concerning for internal hernia or closed-loop obstruction.  General surgery consulted and admitted.  Initially managed conservatively with NG tube decompression.  SBO persisted.  She underwent ex lap with LOA on 11/13 by Dr. Pizza.  Remains n.p.o. with NG tube pending return of bowel function.  Started TPN on 11/18.  Subjective: Seen and examined earlier this morning.  No major events overnight or this morning.  No complaints but not a great historian.  She denies pain but tender with palpation.  Assessment and plan: Small bowel obstruction: Presents with N/V and pain.  CT showed SBO with adjacent transition points in the RLQ, concerning for internal hernia or closed-loop obstruction.  SBO persisted despite NG tube decompression. -S/p ex lap and LOA by Dr. Pizza on 11/13. -NG tube pending return of bowel function. -KUB on 11/18 with reduced small bowel dilation with small amount of contrast in proximal colon. -N.p.o. except sips with meds and ice chips.  Started TPN on 11/18. -Mobilize patient. -Follow further recommendation by surgery.  Memory impairment/cognitive impairment: Seems to be at baseline.  MRI brain without acute intracranial abnormality or reversible cause of memory loss. - Reorientation and delirium  precaution   Hypothyroidism:TSH was 41.7 on 7/22 secondary to medication nonadherence, improved to 7.73 on recheck 9/23. Synthroid  on hold while NPO.   - Continue IV Synthroid  50 mcg daily.   Hyperlipidemia:Not on statin due to history of drug-induced liver injury.   Nonobstructive CAD: Stable.  Not on statin due to history of drug-induced liver injury.   Essential hypertension: Not on meds at home.  Normotensive.  Mild intermittent asthma: Stable -Bronchodilators as needed  Hypokalemia/hypophosphatemia -Monitor replenish with TPN.  Hyponatremia: Mild - Monitor  CKD-3A: Stable -Continue monitoring  Leukocytosis: Resolving without antibiotics. -Continue monitoring.   Severe malnutrition Body mass index is 22.08 kg/m. Nutrition Problem: Severe Malnutrition Etiology: acute illness (SBO) Signs/Symptoms: severe muscle depletion, moderate fat depletion, energy intake < 75% for > 7 days Interventions: TPN   DVT prophylaxis:  enoxaparin  (LOVENOX ) injection 40 mg Start: 04/06/24 1200 SCDs Start: 03/27/24 2136  Code Status: Full code Family Communication: Updated patient's daughter at bedside. Level of care: Med-Surg Status is: Inpatient Remains inpatient appropriate because: SBO/postop ileus   Final disposition: Home   35 minutes with more than 50% spent in reviewing records, counseling patient/family and coordinating care.  Consultants:  General Surgery  Procedures: 11/13-ex lap and LOA by Dr. Pizza  Microbiology summarized: None  Objective: Vitals:   04/06/24 2239 04/07/24 0355 04/07/24 0525 04/07/24 0814  BP: (!) 150/90  (!) 151/93 (!) 139/93  Pulse: 86  85 89  Resp: 16  16 18   Temp: 99.1 F (37.3 C)  98.7 F (37.1 C) 98.7 F (37.1 C)  TempSrc: Oral  Oral Oral  SpO2: 97%  98%  98%  Weight:  53 kg    Height:        Examination:  GENERAL: No apparent distress.  Nontoxic. HEENT: MMM.  Vision and hearing grossly intact.  NG tube in place. NECK:  Supple.  No apparent JVD.  RESP:  No IWOB.  Fair aeration bilaterally. CVS:  RRR. Heart sounds normal.  ABD/GI/GU: BS+. Abd soft.  Honeycomb dressing in place. MSK/EXT:  Moves extremities. No apparent deformity. No edema.  SKIN: As above. NEURO: AA.  Oriented to self, place and family.  No apparent focal neuro deficit. PSYCH: Calm. Normal affect.   Sch Meds:  Scheduled Meds:  Chlorhexidine  Gluconate Cloth  6 each Topical Daily   enoxaparin  (LOVENOX ) injection  40 mg Subcutaneous Q24H   insulin aspart  0-9 Units Subcutaneous Q6H   levothyroxine   50 mcg Intravenous Daily   melatonin  3 mg Oral QHS   thiamine (VITAMIN B1) injection  100 mg Intravenous Daily   Continuous Infusions:  lactated ringers  55 mL/hr at 04/06/24 1738   TPN ADULT (ION) 70 mL/hr at 04/06/24 1757   TPN ADULT (ION)     PRN Meds:.albuterol , hydrALAZINE, HYDROmorphone  (DILAUDID ) injection, menthol, methocarbamol  (ROBAXIN ) injection, ondansetron  (ZOFRAN ) IV, phenol, sodium chloride  flush  Antimicrobials: Anti-infectives (From admission, onward)    Start     Dose/Rate Route Frequency Ordered Stop   04/01/24 0800  cefTRIAXone (ROCEPHIN) 2 g in sodium chloride  0.9 % 100 mL IVPB  Status:  Discontinued        2 g 200 mL/hr over 30 Minutes Intravenous On call to O.R. 04/01/24 0700 04/01/24 0731   04/01/24 0730  cefoTEtan (CEFOTAN) 2 g in sodium chloride  0.9 % 100 mL IVPB        2 g 200 mL/hr over 30 Minutes Intravenous On call to O.R. 04/01/24 0640 04/01/24 1924   03/31/24 1300  cefoTEtan (CEFOTAN) 2 g in sodium chloride  0.9 % 100 mL IVPB  Status:  Discontinued        2 g 200 mL/hr over 30 Minutes Intravenous On call to O.R. 03/31/24 0909 03/31/24 1601   03/31/24 0945  cefTRIAXone (ROCEPHIN) 2 g in sodium chloride  0.9 % 100 mL IVPB  Status:  Discontinued        2 g 200 mL/hr over 30 Minutes Intravenous On call to O.R. 03/31/24 0854 03/31/24 0909        I have personally reviewed the following labs and  images: CBC: Recent Labs  Lab 04/01/24 0521 04/03/24 0452 04/04/24 0509 04/05/24 0455 04/06/24 0315  WBC 8.9 13.9* 16.8* 12.8* 11.9*  NEUTROABS  --   --   --  9.8*  --   HGB 13.3 10.6* 10.4* 10.9* 10.4*  HCT 37.9 30.3* 30.5* 32.9* 30.8*  MCV 89.2 90.4 91.0 94.0 92.5  PLT 235 229 275 308 301   BMP &GFR Recent Labs  Lab 04/01/24 0521 04/03/24 0452 04/04/24 0509 04/05/24 0455 04/06/24 0315 04/07/24 0254  NA 132* 134* 135 138 137 135  K 3.6 3.7 3.5 3.1* 4.0 4.2  CL 92* 95* 95* 97* 99 97*  CO2 25 28 27 31 31 29   GLUCOSE 77 76 66* 126* 145* 131*  BUN 11 23 14  6* <5* 6*  CREATININE 0.68 0.89 0.73 0.70 0.63 0.57  CALCIUM  9.1 8.5* 8.3* 8.5* 8.8* 9.1  MG 2.0  --   --  2.1 1.9 2.1  PHOS 2.6  --   --  1.2* 1.5* 3.3   Estimated Creatinine Clearance: 48.7 mL/min (  by C-G formula based on SCr of 0.57 mg/dL). Liver & Pancreas: Recent Labs  Lab 04/01/24 0521 04/05/24 0455  AST  --  27  ALT  --  <5  ALKPHOS  --  74  BILITOT  --  0.5  PROT  --  6.2*  ALBUMIN 3.7 2.9*   No results for input(s): LIPASE, AMYLASE in the last 168 hours.  No results for input(s): AMMONIA in the last 168 hours. Diabetic: No results for input(s): HGBA1C in the last 72 hours. Recent Labs  Lab 04/06/24 0612 04/06/24 1133 04/06/24 1743 04/06/24 2351 04/07/24 0539  GLUCAP 143* 119* 111* 127* 143*   Cardiac Enzymes: No results for input(s): CKTOTAL, CKMB, CKMBINDEX, TROPONINI in the last 168 hours. No results for input(s): PROBNP in the last 8760 hours. Coagulation Profile: No results for input(s): INR, PROTIME in the last 168 hours. Thyroid  Function Tests: No results for input(s): TSH, T4TOTAL, FREET4, T3FREE, THYROIDAB in the last 72 hours. Lipid Profile: No results for input(s): CHOL, HDL, LDLCALC, TRIG, CHOLHDL, LDLDIRECT in the last 72 hours. Anemia Panel: No results for input(s): VITAMINB12, FOLATE, FERRITIN, TIBC, IRON, RETICCTPCT  in the last 72 hours. Urine analysis:    Component Value Date/Time   COLORURINE STRAW (A) 03/27/2024 2012   APPEARANCEUR CLEAR 03/27/2024 2012   LABSPEC 1.013 03/27/2024 2012   PHURINE 7.0 03/27/2024 2012   GLUCOSEU NEGATIVE 03/27/2024 2012   GLUCOSEU NEGATIVE 07/23/2010 1623   HGBUR NEGATIVE 03/27/2024 2012   BILIRUBINUR NEGATIVE 03/27/2024 2012   KETONESUR NEGATIVE 03/27/2024 2012   PROTEINUR NEGATIVE 03/27/2024 2012   UROBILINOGEN 0.2 07/23/2010 1623   NITRITE NEGATIVE 03/27/2024 2012   LEUKOCYTESUR NEGATIVE 03/27/2024 2012   Sepsis Labs: Invalid input(s): PROCALCITONIN, LACTICIDVEN  Microbiology: No results found for this or any previous visit (from the past 240 hours).  Radiology Studies: DG Abd Portable 1V Result Date: 04/06/2024 EXAM: 1 VIEW XRAY OF THE ABDOMEN 04/06/2024 10:33:00 AM COMPARISON: 04/01/2024 CLINICAL HISTORY: Ileus (HCC) 01250 FINDINGS: LINES, TUBES AND DEVICES: Enteric tube in place, terminating within the gastric body. BOWEL: Reduced small bowel dilatation with only a mildly dilated left upper quadrant small bowel loop currently identified. Small amount of contrast in the proximal colon. SOFT TISSUES: Cholecystectomy clips in right upper quadrant noted. Midline surgical staples noted. No opaque urinary calculi. BONES: No acute osseous abnormality. LUNGS: Suspected subsegmental atelectasis in the lung bases. IMPRESSION: 1. Reduced small bowel dilatation with only a mildly dilated left upper quadrant small bowel loop currently identified. 2. Small amount of contrast in the proximal colon. 3. Suspected subsegmental atelectasis in the lung bases. Electronically signed by: Ryan Salvage MD 04/06/2024 02:30 PM EST RP Workstation: [id number]        Ujbz T. Jennessy Sandridge [facility]  If 7PM-7AM, please contact night-coverage [email or web address] 04/07/2024, 10:31 AM

## 2024-04-08 ENCOUNTER — Ambulatory Visit

## 2024-04-08 ENCOUNTER — Inpatient Hospital Stay (HOSPITAL_COMMUNITY)

## 2024-04-08 ENCOUNTER — Ambulatory Visit: Admitting: Physician Assistant

## 2024-04-08 DIAGNOSIS — K56609 Unspecified intestinal obstruction, unspecified as to partial versus complete obstruction: Secondary | ICD-10-CM | POA: Diagnosis not present

## 2024-04-08 DIAGNOSIS — E782 Mixed hyperlipidemia: Secondary | ICD-10-CM | POA: Diagnosis not present

## 2024-04-08 DIAGNOSIS — N1831 Chronic kidney disease, stage 3a: Secondary | ICD-10-CM | POA: Diagnosis not present

## 2024-04-08 DIAGNOSIS — I2510 Atherosclerotic heart disease of native coronary artery without angina pectoris: Secondary | ICD-10-CM | POA: Diagnosis not present

## 2024-04-08 LAB — GLUCOSE, CAPILLARY
Glucose-Capillary: 132 mg/dL — ABNORMAL HIGH (ref 70–99)
Glucose-Capillary: 139 mg/dL — ABNORMAL HIGH (ref 70–99)
Glucose-Capillary: 142 mg/dL — ABNORMAL HIGH (ref 70–99)
Glucose-Capillary: 162 mg/dL — ABNORMAL HIGH (ref 70–99)

## 2024-04-08 LAB — COMPREHENSIVE METABOLIC PANEL WITH GFR
ALT: <5 U/L (ref 0–44)
AST: 30 U/L (ref 15–41)
Albumin: 3.2 g/dL — ABNORMAL LOW (ref 3.5–5.0)
Alkaline Phosphatase: 85 U/L (ref 38–126)
Anion gap: 8 (ref 5–15)
BUN: 11 mg/dL (ref 8–23)
CO2: 27 mmol/L (ref 22–32)
Calcium: 8.8 mg/dL — ABNORMAL LOW (ref 8.9–10.3)
Chloride: 95 mmol/L — ABNORMAL LOW (ref 98–111)
Creatinine, Ser: 0.58 mg/dL (ref 0.44–1.00)
GFR, Estimated: >60 mL/min (ref >60)
Glucose, Bld: 132 mg/dL — ABNORMAL HIGH (ref 70–99)
Potassium: 4.6 mmol/L (ref 3.5–5.1)
Sodium: 130 mmol/L — ABNORMAL LOW (ref 135–145)
Total Bilirubin: 0.5 mg/dL (ref 0.0–1.2)
Total Protein: 7.1 g/dL (ref 6.5–8.1)

## 2024-04-08 LAB — PHOSPHORUS: Phosphorus: 3.2 mg/dL (ref 2.5–4.6)

## 2024-04-08 LAB — MAGNESIUM: Magnesium: 2.3 mg/dL (ref 1.7–2.4)

## 2024-04-08 MED ORDER — IOHEXOL 300 MG/ML  SOLN
100.0000 mL | Freq: Once | INTRAMUSCULAR | Status: AC | PRN
  Administered 2024-04-08: 100 mL via INTRAVENOUS

## 2024-04-08 MED ORDER — IOHEXOL 9 MG/ML PO SOLN
ORAL | Status: AC
  Filled 2024-04-08: qty 1000

## 2024-04-08 MED ORDER — IOHEXOL 9 MG/ML PO SOLN
500.0000 mL | ORAL | Status: AC
  Administered 2024-04-08 (×2): 500 mL via ORAL

## 2024-04-08 MED ORDER — TRAVASOL 10 % IV SOLN
INTRAVENOUS | Status: AC
  Filled 2024-04-08: qty 705.6

## 2024-04-08 NOTE — Progress Notes (Signed)
 PHARMACY - TOTAL PARENTERAL NUTRITION CONSULT NOTE   Indication: Prolonged ileus  Patient Measurements: Height: 5' 1 (154.9 cm) Weight: 51.8 kg (114 lb 3.2 oz) IBW/kg (Calculated) : 47.8 TPN AdjBW (KG): 52.2 Body mass index is 21.58 kg/m. Usual Weight:   Assessment: Patient is a 73 y.o F who presented to the ED on 03/27/24 with c/o abdominal pain and n/v. Abdominal CT on 03/27/24 showed SBO.   She subsequently underwent exp lap with small bowel resection, lysis of adhesions and enterorrhaphy on 04/01/24.  Pharmacy has been consulted on 04/05/24 to start TPN for prolonged ileus.  Glucose / Insulin: no hx DM.  CBGs: 118-162 (goal range 140-180 mg/dL).  SSI / 24 hrs: 4 units Electrolytes: Na and Cl low/decreased, Others WNL including CorrCa 9.44 - goal for ileus: Mag >2 and K >4 Renal: Scr <1, BUN 11 Hepatic: LFTs wnl - albumin low 2.9, prealbumin low 6 Intake / Output; MIVF:   - mIVF LR @ 55 ml/hr - Output: urine 2.3 L, NG .  Net I/O - GI Imaging: 11/18 Abd xray: Reduced small bowel dilatation  GI Surgeries / Procedures:  - 11/9: unsuccessful  placement of NG tube - 11/10: NGT - 11/13: exp lap with small bowel resection, lysis of adhesions and enterorrhaphy.   Central access: PICC 11/17 TPN start date: 04/05/24   Nutritional Goals: Goal TPN rate is 70 mL/hr (provides 70 g of protein and 1632 kcals per day)  RD Assessment: Estimated Needs Total Energy Estimated Needs: 1550-1750 Total Protein Estimated Needs: 65-80g Total Fluid Estimated Needs: 1.7L/day  Current Nutrition:  - NPO, TPN - NG tube to LIWS  Plan:  At 18:00 Continue TPN at goal rate 70 ml/hr - Electrolytes in TPN:  Na 100 mEq/L K 40 mEq/L Ca 2.5 mEq/L Mg 5 mEq/L  Phos 20 mmol/L Cl:Ac = Max Cl - Continue standard MVI and trace elements in TPN - Continue Sensitive q6h SSI and adjust as needed  - D/C mIVF on 11/20 per Dr. Kathrin.   - Thiamine 100 mg IV daily x 5 days (11/17-11/21) - Monitor  TPN labs on Mon/Thurs and PRN   Wanda Hasting PharmD, BCPS [facility] main pharmacy [phone] 04/08/2024 9:33 AM

## 2024-04-08 NOTE — Plan of Care (Signed)

## 2024-04-08 NOTE — Progress Notes (Signed)
 Nutrition Follow-up  DOCUMENTATION CODES:   Severe malnutrition in context of acute illness/injury  INTERVENTION:   Monitor magnesium , potassium, and phosphorus daily for at least 3 days, MD to replete as needed, as pt is at risk for refeeding syndrome. -Recommend 100 mg Thiamine for at least 5 days.   -TPN management per Pharmacy -Daily weights while on TPN   -Monitor for diet advancement  NUTRITION DIAGNOSIS:   Severe Malnutrition related to acute illness (SBO) as evidenced by severe muscle depletion, moderate fat depletion, energy intake < 75% for > 7 days.  Ongoing.  GOAL:   Patient will meet greater than or equal to 90% of their needs  Meeting with TPN  MONITOR:   Diet advancement (TPN)  ASSESSMENT:   73 year old F with PMH of nonobstructive CAD, CKD-3A, memory loss,  DILI due to statin, post ERCP pancreatitis in 2000, asthma, HTN and HLD presented to ED with nausea, vomiting and abdominal pain, and admitted with small bowel obstruction.  CT abdomen and pelvis showed SBO with 2 adjacent transition points in RLQ and fecalization of the bowel between the transition point concerning for internal hernia or closed-loop obstruction.She underwent ex lap with LOA on 11/13 by Dr. Teresa.  11/8: admitted, NPO 11/13: s/p ex lap, LOA 11/17: TPN initiated  Patient continues to be NPO. NGT still in place, awaiting return of bowel function. Pt having CT of abdomen today.   TPN continuing at 70 ml/hr, providing 1632 kcals and 70g protein.  Admission weight: 115 lbs Current weight: 114 lbs  Medications: Thiamine  Labs reviewed: CBGs: 118-142 Low sodium   Diet Order:   Diet Order             Diet NPO time specified Except for: Sips with Meds, Ice Chips  Diet effective now                   EDUCATION NEEDS:   Education needs have been addressed  Skin:  Skin Assessment: Skin Integrity Issues: Skin Integrity Issues:: Incisions Incisions: 11/13 abdomen  Last  BM:  11/7  Height:   Ht Readings from Last 1 Encounters:  03/31/24 5' 1 (1.549 m)    Weight:   Wt Readings from Last 1 Encounters:  04/08/24 51.8 kg    BMI:  Body mass index is 21.58 kg/m.  Estimated Nutritional Needs:   Kcal:  1550-1750  Protein:  65-80g  Fluid:  1.7L/day  Morna Lee, MS, RD, LDN Inpatient Clinical Dietitian Contact via Secure chat

## 2024-04-08 NOTE — Progress Notes (Signed)
 PROGRESS NOTE  Dominique Mooney FMW:[id number] DOB: [birth year]   PCP: Emerick Avelina POUR, PA-C  Patient is from: Home.  Lives alone.  Family checks on her every night.  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  CT abdomen and pelvis showed SBO with 2 adjacent transition points in RLQ and fecalization of the bowel between the transition point concerning for internal hernia or closed-loop obstruction.  General surgery consulted and admitted.  Initially managed conservatively with NG tube decompression.  SBO persisted.  She underwent ex lap with LOA on 11/13 by Dr. Pizza.  Prolonged ileus postoperatively.  Started TPN on 11/18.  Subjective: Seen and examined earlier this morning.  No major events overnight or this morning.  No complaints.  Daughter at bedside.  Assessment and plan: Small bowel obstruction/prolonged postop ileus: Presents with N/V and pain.  CT on admission showed SBO with adjacent transition points in the RLQ, concerning for internal hernia or closed-loop obstruction.  SBO persisted despite NG tube decompression and she underwent ex lap and LOA by Dr. Pizza on 11/13.  Prolonged postop ileus. -General surgery managing. -NG tube pending return of bowel function.  N.p.o. except sips with meds.  TPN started on 11/18. -Follow CT abdomen and pelvis -Mobilize patient.  Memory impairment/cognitive impairment: Seems to be at baseline.  MRI brain without acute intracranial abnormality or reversible cause of memory loss. - Reorientation and delirium precaution   Hypothyroidism:TSH was 41.7 on 7/22 secondary to medication nonadherence, improved to 7.73 on  recheck 9/23. Synthroid  on hold while NPO.   - Continue IV Synthroid  50 mcg daily.   Hyperlipidemia:Not on statin due to history of drug-induced liver injury.   Nonobstructive CAD: Stable.  Not on statin due to history of drug-induced liver injury.   Essential hypertension: Not on meds at home.  Normotensive.  Mild intermittent asthma: Stable -Bronchodilators as needed  Hypokalemia/hypophosphatemia -Monitor replenish with TPN.  Hyponatremia: Mild - Monitor  CKD-3A: Stable -Continue monitoring  Leukocytosis: Resolving without antibiotics. -Continue monitoring.   Severe malnutrition Body mass index is 21.58 kg/m. Nutrition Problem: Severe Malnutrition Etiology: acute illness (SBO) Signs/Symptoms: severe muscle depletion, moderate fat depletion, energy intake < 75% for > 7 days Interventions: TPN   DVT prophylaxis:  enoxaparin  (LOVENOX ) injection 40 mg Start: 04/06/24 1200 SCDs Start: 03/27/24 2136  Code Status: Full code Family Communication: Updated patient's daughter at bedside. Level of care: Med-Surg Status is: Inpatient Remains inpatient appropriate because: SBO/postop ileus   Final disposition: Home   35 minutes with more than 50% spent in reviewing records, counseling patient/family and coordinating care.  Consultants:  General Surgery  Procedures: 11/13-ex lap and LOA by Dr. Pizza  Microbiology summarized: None  Objective: Vitals:   04/07/24 1432 04/07/24 2044 04/08/24 0454 04/08/24 0500  BP: (!) 143/91 (!) 163/95 (!) 156/94   Pulse: 83 85 87   Resp: 18 15 18    Temp: 98.7 F (37.1 C) 99.3 F (37.4 C) 98.6 F (37 C)   TempSrc: Oral Oral    SpO2: 99% 99% 99%   Weight:    51.8 kg  Height:        Examination:  GENERAL: No  apparent distress.  Nontoxic. HEENT: MMM.  Vision and hearing grossly intact.  NG tube in place. NECK: Supple.  No apparent JVD.  RESP:  No IWOB.  Fair aeration bilaterally. CVS:  RRR. Heart sounds normal.  ABD/GI/GU:  BS+. Abd soft.  Honeycomb dressing in place. MSK/EXT:  Moves extremities. No apparent deformity. No edema.  SKIN: As above. NEURO: AA.  Oriented to self, place and family.  No apparent focal neuro deficit. PSYCH: Calm. Normal affect.   Sch Meds:  Scheduled Meds:  Chlorhexidine  Gluconate Cloth  6 each Topical Daily   enoxaparin  (LOVENOX ) injection  40 mg Subcutaneous Q24H   insulin aspart  0-9 Units Subcutaneous Q6H   levothyroxine   50 mcg Intravenous Daily   melatonin  3 mg Oral QHS   thiamine (VITAMIN B1) injection  100 mg Intravenous Daily   Continuous Infusions:  TPN ADULT (ION) 70 mL/hr at 04/07/24 1824   TPN ADULT (ION)     PRN Meds:.albuterol , hydrALAZINE, HYDROmorphone  (DILAUDID ) injection, menthol, methocarbamol  (ROBAXIN ) injection, ondansetron  (ZOFRAN ) IV, phenol, sodium chloride  flush  Antimicrobials: Anti-infectives (From admission, onward)    Start     Dose/Rate Route Frequency Ordered Stop   04/01/24 0800  cefTRIAXone (ROCEPHIN) 2 g in sodium chloride  0.9 % 100 mL IVPB  Status:  Discontinued        2 g 200 mL/hr over 30 Minutes Intravenous On call to O.R. 04/01/24 0700 04/01/24 0731   04/01/24 0730  cefoTEtan (CEFOTAN) 2 g in sodium chloride  0.9 % 100 mL IVPB        2 g 200 mL/hr over 30 Minutes Intravenous On call to O.R. 04/01/24 0640 04/01/24 1924   03/31/24 1300  cefoTEtan (CEFOTAN) 2 g in sodium chloride  0.9 % 100 mL IVPB  Status:  Discontinued        2 g 200 mL/hr over 30 Minutes Intravenous On call to O.R. 03/31/24 0909 03/31/24 1601   03/31/24 0945  cefTRIAXone (ROCEPHIN) 2 g in sodium chloride  0.9 % 100 mL IVPB  Status:  Discontinued        2 g 200 mL/hr over 30 Minutes Intravenous On call to O.R. 03/31/24 0854 03/31/24 0909        I have personally reviewed the following labs and images: CBC: Recent Labs  Lab 04/03/24 0452 04/04/24 0509 04/05/24 0455 04/06/24 0315  WBC 13.9* 16.8* 12.8* 11.9*  NEUTROABS  --   --  9.8*  --   HGB 10.6* 10.4*  10.9* 10.4*  HCT 30.3* 30.5* 32.9* 30.8*  MCV 90.4 91.0 94.0 92.5  PLT 229 275 308 301   BMP &GFR Recent Labs  Lab 04/04/24 0509 04/05/24 0455 04/06/24 0315 04/07/24 0254 04/08/24 0135  NA 135 138 137 135 130*  K 3.5 3.1* 4.0 4.2 4.6  CL 95* 97* 99 97* 95*  CO2 27 31 31 29 27   GLUCOSE 66* 126* 145* 131* 132*  BUN 14 6* <5* 6* 11  CREATININE 0.73 0.70 0.63 0.57 0.58  CALCIUM  8.3* 8.5* 8.8* 9.1 8.8*  MG  --  2.1 1.9 2.1 2.3  PHOS  --  1.2* 1.5* 3.3 3.2   Estimated Creatinine Clearance: 48.7 mL/min (by C-G formula based on SCr of 0.58 mg/dL). Liver & Pancreas: Recent Labs  Lab 04/05/24 0455 04/08/24 0135  AST 27 30  ALT <5 <5  ALKPHOS 74 85  BILITOT 0.5 0.5  PROT 6.2* 7.1  ALBUMIN 2.9* 3.2*   No results for input(s): LIPASE, AMYLASE in the last 168  hours.  No results for input(s): AMMONIA in the last 168 hours. Diabetic: No results for input(s): HGBA1C in the last 72 hours. Recent Labs  Lab 04/07/24 0539 04/07/24 1136 04/07/24 1726 04/08/24 0011 04/08/24 0458  GLUCAP 143* 138* 118* 162* 142*   Cardiac Enzymes: No results for input(s): CKTOTAL, CKMB, CKMBINDEX, TROPONINI in the last 168 hours. No results for input(s): PROBNP in the last 8760 hours. Coagulation Profile: No results for input(s): INR, PROTIME in the last 168 hours. Thyroid  Function Tests: No results for input(s): TSH, T4TOTAL, FREET4, T3FREE, THYROIDAB in the last 72 hours. Lipid Profile: No results for input(s): CHOL, HDL, LDLCALC, TRIG, CHOLHDL, LDLDIRECT in the last 72 hours. Anemia Panel: No results for input(s): VITAMINB12, FOLATE, FERRITIN, TIBC, IRON, RETICCTPCT in the last 72 hours. Urine analysis:    Component Value Date/Time   COLORURINE STRAW (A) 03/27/2024 2012   APPEARANCEUR CLEAR 03/27/2024 2012   LABSPEC 1.013 03/27/2024 2012   PHURINE 7.0 03/27/2024 2012   GLUCOSEU NEGATIVE 03/27/2024 2012   GLUCOSEU NEGATIVE  07/23/2010 1623   HGBUR NEGATIVE 03/27/2024 2012   BILIRUBINUR NEGATIVE 03/27/2024 2012   KETONESUR NEGATIVE 03/27/2024 2012   PROTEINUR NEGATIVE 03/27/2024 2012   UROBILINOGEN 0.2 07/23/2010 1623   NITRITE NEGATIVE 03/27/2024 2012   LEUKOCYTESUR NEGATIVE 03/27/2024 2012   Sepsis Labs: Invalid input(s): PROCALCITONIN, LACTICIDVEN  Microbiology: No results found for this or any previous visit (from the past 240 hours).  Radiology Studies: No results found.       Deigo Alonso T. Aza Dantes [facility]  If 7PM-7AM, please contact night-coverage [email or web address] 04/08/2024, 10:52 AM

## 2024-04-08 NOTE — Progress Notes (Signed)
 7 Days Post-Op  Subjective: No bowel function yet.  No new complaints.  Sleeping this am  Objective: Vital signs in last 24 hours: Temp:  [98.6 F (37 C)-99.3 F (37.4 C)] 98.6 F (37 C) (11/20 0454) Pulse Rate:  [83-87] 87 (11/20 0454) Resp:  [15-18] 18 (11/20 0454) BP: (143-163)/(91-95) 156/94 (11/20 0454) SpO2:  [99 %] 99 % (11/20 0454) Weight:  [51.8 kg] 51.8 kg (11/20 0500) Last BM Date : 03/26/24  Intake/Output from previous day: 11/19 0701 - 11/20 0700 In: 2902.1 [I.V.:2902.1] Out: 3050 [Urine:2300; Emesis/NG output:750] Intake/Output this shift: Total I/O In: 1000 [NG/GT:1000] Out: -   PE: Gen: NAD HEENT: NGT in place with bilious output, 750cc noted yesterday Abd: soft, appropriately tender, ND, incision c/d/I with staples   Lab Results:  Recent Labs    04/06/24 0315  WBC 11.9*  HGB 10.4*  HCT 30.8*  PLT 301   BMET Recent Labs    04/07/24 0254 04/08/24 0135  NA 135 130*  K 4.2 4.6  CL 97* 95*  CO2 29 27  GLUCOSE 131* 132*  BUN 6* 11  CREATININE 0.57 0.58  CALCIUM  9.1 8.8*   PT/INR No results for input(s): LABPROT, INR in the last 72 hours. CMP     Component Value Date/Time   NA 130 (L) 04/08/2024 0135   NA 142 08/09/2022 0913   K 4.6 04/08/2024 0135   CL 95 (L) 04/08/2024 0135   CO2 27 04/08/2024 0135   GLUCOSE 132 (H) 04/08/2024 0135   BUN 11 04/08/2024 0135   BUN 9 08/09/2022 0913   CREATININE 0.58 04/08/2024 0135   CALCIUM  8.8 (L) 04/08/2024 0135   PROT 7.1 04/08/2024 0135   PROT 6.6 08/09/2022 0913   ALBUMIN 3.2 (L) 04/08/2024 0135   ALBUMIN 4.0 08/09/2022 0913   AST 30 04/08/2024 0135   ALT <5 04/08/2024 0135   ALKPHOS 85 04/08/2024 0135   BILITOT 0.5 04/08/2024 0135   BILITOT 0.7 08/09/2022 0913   GFRNONAA >60 04/08/2024 0135   GFRAA >60 12/31/2019 1156   Lipase     Component Value Date/Time   LIPASE 36 03/27/2024 1833       Studies/Results: DG Abd Portable 1V Result Date: 04/06/2024 EXAM: 1 VIEW  XRAY OF THE ABDOMEN 04/06/2024 10:33:00 AM COMPARISON: 04/01/2024 CLINICAL HISTORY: Ileus (HCC) 01250 FINDINGS: LINES, TUBES AND DEVICES: Enteric tube in place, terminating within the gastric body. BOWEL: Reduced small bowel dilatation with only a mildly dilated left upper quadrant small bowel loop currently identified. Small amount of contrast in the proximal colon. SOFT TISSUES: Cholecystectomy clips in right upper quadrant noted. Midline surgical staples noted. No opaque urinary calculi. BONES: No acute osseous abnormality. LUNGS: Suspected subsegmental atelectasis in the lung bases. IMPRESSION: 1. Reduced small bowel dilatation with only a mildly dilated left upper quadrant small bowel loop currently identified. 2. Small amount of contrast in the proximal colon. 3. Suspected subsegmental atelectasis in the lung bases. Electronically signed by: Ryan Salvage MD 04/06/2024 02:30 PM EST RP Workstation: [id number]     Anti-infectives: Anti-infectives (From admission, onward)    Start     Dose/Rate Route Frequency Ordered Stop   04/01/24 0800  cefTRIAXone (ROCEPHIN) 2 g in sodium chloride  0.9 % 100 mL IVPB  Status:  Discontinued        2 g 200 mL/hr over 30 Minutes Intravenous On call to O.R. 04/01/24 0700 04/01/24 0731   04/01/24 0730  cefoTEtan (CEFOTAN) 2 g in sodium chloride  0.9 %  100 mL IVPB        2 g 200 mL/hr over 30 Minutes Intravenous On call to O.R. 04/01/24 0640 04/01/24 1924   03/31/24 1300  cefoTEtan (CEFOTAN) 2 g in sodium chloride  0.9 % 100 mL IVPB  Status:  Discontinued        2 g 200 mL/hr over 30 Minutes Intravenous On call to O.R. 03/31/24 0909 03/31/24 1601   03/31/24 0945  cefTRIAXone (ROCEPHIN) 2 g in sodium chloride  0.9 % 100 mL IVPB  Status:  Discontinued        2 g 200 mL/hr over 30 Minutes Intravenous On call to O.R. 03/31/24 0854 03/31/24 0909        Assessment/Plan POD 7, s/p ex lap with LOA and SBR for SBO, Dr. Teresa 11/13 -failed NGT clamping trial  yesterday -CT A/P today to eval for complication causing a prolonged ileus. -given prolonged NPO status, picc/TNA.  -mobilize, pulm toilet -multi-modal pain control -K 4.6 today -discussed plan with daughter and patient at bedside   FEN - NPO/NGT/IVFs/PICC/TNA VTE - Lovenox  ID - rocephin on call to OR, no further needed    LOS: 12 days   Burnard FORBES Banter, [facility]

## 2024-04-09 DIAGNOSIS — K56609 Unspecified intestinal obstruction, unspecified as to partial versus complete obstruction: Secondary | ICD-10-CM | POA: Diagnosis not present

## 2024-04-09 DIAGNOSIS — E782 Mixed hyperlipidemia: Secondary | ICD-10-CM | POA: Diagnosis not present

## 2024-04-09 DIAGNOSIS — I2510 Atherosclerotic heart disease of native coronary artery without angina pectoris: Secondary | ICD-10-CM | POA: Diagnosis not present

## 2024-04-09 DIAGNOSIS — N1831 Chronic kidney disease, stage 3a: Secondary | ICD-10-CM | POA: Diagnosis not present

## 2024-04-09 LAB — GLUCOSE, CAPILLARY
Glucose-Capillary: 105 mg/dL — ABNORMAL HIGH (ref 70–99)
Glucose-Capillary: 122 mg/dL — ABNORMAL HIGH (ref 70–99)
Glucose-Capillary: 126 mg/dL — ABNORMAL HIGH (ref 70–99)
Glucose-Capillary: 130 mg/dL — ABNORMAL HIGH (ref 70–99)
Glucose-Capillary: 132 mg/dL — ABNORMAL HIGH (ref 70–99)

## 2024-04-09 LAB — CBC
HCT: 32.7 % — ABNORMAL LOW (ref 36.0–46.0)
Hemoglobin: 11.1 g/dL — ABNORMAL LOW (ref 12.0–15.0)
MCH: 30.9 pg (ref 26.0–34.0)
MCHC: 33.9 g/dL (ref 30.0–36.0)
MCV: 91.1 fL (ref 80.0–100.0)
Platelets: 349 K/uL (ref 150–400)
RBC: 3.59 MIL/uL — ABNORMAL LOW (ref 3.87–5.11)
RDW: 12.3 % (ref 11.5–15.5)
WBC: 17 K/uL — ABNORMAL HIGH (ref 4.0–10.5)
nRBC: 0 % (ref 0.0–0.2)

## 2024-04-09 LAB — BASIC METABOLIC PANEL WITH GFR
Anion gap: 7 (ref 5–15)
BUN: 15 mg/dL (ref 8–23)
CO2: 27 mmol/L (ref 22–32)
Calcium: 8.5 mg/dL — ABNORMAL LOW (ref 8.9–10.3)
Chloride: 96 mmol/L — ABNORMAL LOW (ref 98–111)
Creatinine, Ser: 0.67 mg/dL (ref 0.44–1.00)
GFR, Estimated: >60 mL/min (ref >60)
Glucose, Bld: 124 mg/dL — ABNORMAL HIGH (ref 70–99)
Potassium: 4.7 mmol/L (ref 3.5–5.1)
Sodium: 130 mmol/L — ABNORMAL LOW (ref 135–145)

## 2024-04-09 LAB — PHOSPHORUS: Phosphorus: 3.6 mg/dL (ref 2.5–4.6)

## 2024-04-09 LAB — MAGNESIUM: Magnesium: 2.4 mg/dL (ref 1.7–2.4)

## 2024-04-09 MED ORDER — TRAVASOL 10 % IV SOLN
INTRAVENOUS | Status: AC
  Filled 2024-04-09: qty 705.6

## 2024-04-09 NOTE — Progress Notes (Signed)
 Mobility Specialist Progress Note:   04/09/24 1446  Mobility  Activity Ambulated with assistance  Level of Assistance Contact guard assist, steadying assist  Assistive Device Front wheel walker  Distance Ambulated (ft) 200 ft  Activity Response Tolerated well  Mobility Referral Yes  Mobility visit 1 Mobility  Mobility Specialist Start Time (ACUTE ONLY) 1415  Mobility Specialist Stop Time (ACUTE ONLY) 1428  Mobility Specialist Time Calculation (min) (ACUTE ONLY) 13 min   Pt was received in recliner and agreed to mobility. No complaints during ambulation. Returned to recliner with all needs met and call bell in reach. Left in room with RN.  [employer] - Mobility Specialist

## 2024-04-09 NOTE — Progress Notes (Signed)
 PROGRESS NOTE  Dominique Mooney FMW:[id number] DOB: Oct 29, 1950   PCP: Emerick Avelina POUR, PA-C  Patient is from: Home.  Lives alone.  Family checks on her every night.  DOA: 03/27/2024 LOS: 13  Chief complaints Chief Complaint  Patient presents with   Abdominal Pain     Brief Narrative / Interim history: 73 year old F with PMH of nonobstructive CAD, CKD-3A, memory loss,  DILI due to statin, post ERCP pancreatitis in 2000, asthma, HTN and HLD presented to ED with nausea, vomiting and abdominal pain, and admitted with small bowel obstruction.  CT abdomen and pelvis showed SBO with 2 adjacent transition points in RLQ and fecalization of the bowel between the transition point concerning for internal hernia or closed-loop obstruction.  General surgery consulted and admitted.  Initially managed conservatively with NG tube decompression.  SBO persisted.  She underwent ex lap with LOA on 11/13 by Dr. Pizza.  Prolonged ileus postoperatively.  Started TPN on 11/18.  Repeat CT abdomen and pelvis on 11/20 suggested possible ileus or partial SBO, fluid anterior to rectum and in LLQ.  She has some leukocytosis but no fever  Subjective: Seen and examined earlier this morning.  No major events overnight or this morning.  No complaints.  Daughter at bedside.  Assessment and plan: Small bowel obstruction/prolonged postop ileus: Presents with N/V and pain.  CT on admission showed SBO with adjacent transition points in the RLQ, concerning for internal hernia or closed-loop obstruction.  SBO persisted despite NG tube decompression and she underwent ex lap and LOA by Dr. Pizza on 11/13.  Prolonged postop ileus.  CT abdomen and pelvis on 11/20 as above. -General surgery managing-NGT, n.p.o. except sips with meds and TPN (started on 11/18). -Monitor leukocytosis. -Mobilize patient.  Memory impairment/cognitive impairment: Seems to be at baseline.  MRI brain without acute intracranial abnormality or reversible  cause of memory loss. - Reorientation and delirium precaution   Hypothyroidism:TSH was 41.7 on 7/22 secondary to medication nonadherence, improved to 7.73 on recheck 9/23. Synthroid  on hold while NPO.   - Continue IV Synthroid  50 mcg daily.   Hyperlipidemia:Not on statin due to history of drug-induced liver injury.   Nonobstructive CAD: Stable.  Not on statin due to history of drug-induced liver injury.   Essential hypertension: Not on meds at home.  Normotensive.  Mild intermittent asthma: Stable -Bronchodilators as needed  Hypokalemia/hypophosphatemia -Monitor replenish with TPN.  Hyponatremia: Mild - Monitor  CKD-3A: Stable -Continue monitoring  Leukocytosis: Resolving without antibiotics. -Continue monitoring.   Severe malnutrition Body mass index is 21.33 kg/m. Nutrition Problem: Severe Malnutrition Etiology: acute illness (SBO) Signs/Symptoms: severe muscle depletion, moderate fat depletion, energy intake < 75% for > 7 days Interventions: TPN   DVT prophylaxis:  enoxaparin  (LOVENOX ) injection 40 mg Start: 04/06/24 1200 SCDs Start: 03/27/24 2136  Code Status: Full code Family Communication: Updated patient's daughter at bedside. Level of care: Med-Surg Status is: Inpatient Remains inpatient appropriate because: SBO/postop ileus   Final disposition: Home   35 minutes with more than 50% spent in reviewing records, counseling patient/family and coordinating care.  Consultants:  General Surgery  Procedures: 11/13-ex lap and LOA by Dr. Pizza  Microbiology summarized: None  Objective: Vitals:   04/08/24 1400 04/08/24 2132 04/09/24 0500 04/09/24 0641  BP: (!) 146/93 (!) 149/91  117/87  Pulse: 100 92  89  Resp: 17 16  16   Temp: 98.6 F (37 C) 98.6 F (37 C)  98.7 F (37.1 C)  TempSrc: Oral  SpO2: 99% 99%  99%  Weight:   51.2 kg   Height:        Examination:  GENERAL: No apparent distress.  Nontoxic. HEENT: MMM.  Vision and hearing grossly  intact.  NG tube in place. NECK: Supple.  No apparent JVD.  RESP:  No IWOB.  Fair aeration bilaterally. CVS:  RRR. Heart sounds normal.  ABD/GI/GU: BS+. Abd soft.  Honeycomb dressing in place. MSK/EXT:  Moves extremities. No apparent deformity. No edema.  SKIN: As above. NEURO: AA.  Oriented to self, place and family.  No apparent focal neuro deficit. PSYCH: Calm. Normal affect.   Sch Meds:  Scheduled Meds:  Chlorhexidine  Gluconate Cloth  6 each Topical Daily   enoxaparin  (LOVENOX ) injection  40 mg Subcutaneous Q24H   insulin  aspart  0-9 Units Subcutaneous Q6H   levothyroxine   50 mcg Intravenous Daily   melatonin  3 mg Oral QHS   thiamine  (VITAMIN B1) injection  100 mg Intravenous Daily   Continuous Infusions:  TPN ADULT (ION) 70 mL/hr at 04/08/24 1732   PRN Meds:.albuterol , hydrALAZINE , HYDROmorphone  (DILAUDID ) injection, menthol , methocarbamol  (ROBAXIN ) injection, ondansetron  (ZOFRAN ) IV, phenol, sodium chloride  flush  Antimicrobials: Anti-infectives (From admission, onward)    Start     Dose/Rate Route Frequency Ordered Stop   04/01/24 0800  cefTRIAXone (ROCEPHIN) 2 g in sodium chloride  0.9 % 100 mL IVPB  Status:  Discontinued        2 g 200 mL/hr over 30 Minutes Intravenous On call to O.R. 04/01/24 0700 04/01/24 0731   04/01/24 0730  cefoTEtan  (CEFOTAN ) 2 g in sodium chloride  0.9 % 100 mL IVPB        2 g 200 mL/hr over 30 Minutes Intravenous On call to O.R. 04/01/24 0640 04/01/24 1924   03/31/24 1300  cefoTEtan  (CEFOTAN ) 2 g in sodium chloride  0.9 % 100 mL IVPB  Status:  Discontinued        2 g 200 mL/hr over 30 Minutes Intravenous On call to O.R. 03/31/24 0909 03/31/24 1601   03/31/24 0945  cefTRIAXone (ROCEPHIN) 2 g in sodium chloride  0.9 % 100 mL IVPB  Status:  Discontinued        2 g 200 mL/hr over 30 Minutes Intravenous On call to O.R. 03/31/24 0854 03/31/24 0909        I have personally reviewed the following labs and images: CBC: Recent Labs  Lab  04/03/24 0452 04/04/24 0509 04/05/24 0455 04/06/24 0315 04/09/24 0010  WBC 13.9* 16.8* 12.8* 11.9* 17.0*  NEUTROABS  --   --  9.8*  --   --   HGB 10.6* 10.4* 10.9* 10.4* 11.1*  HCT 30.3* 30.5* 32.9* 30.8* 32.7*  MCV 90.4 91.0 94.0 92.5 91.1  PLT 229 275 308 301 349   BMP &GFR Recent Labs  Lab 04/05/24 0455 04/06/24 0315 04/07/24 0254 04/08/24 0135 04/09/24 0010  NA 138 137 135 130* 130*  K 3.1* 4.0 4.2 4.6 4.7  CL 97* 99 97* 95* 96*  CO2 31 31 29 27 27   GLUCOSE 126* 145* 131* 132* 124*  BUN 6* <5* 6* 11 15  CREATININE 0.70 0.63 0.57 0.58 0.67  CALCIUM  8.5* 8.8* 9.1 8.8* 8.5*  MG 2.1 1.9 2.1 2.3 2.4  PHOS 1.2* 1.5* 3.3 3.2 3.6   Estimated Creatinine Clearance: 48.7 mL/min (by C-G formula based on SCr of 0.67 mg/dL). Liver & Pancreas: Recent Labs  Lab 04/05/24 0455 04/08/24 0135  AST 27 30  ALT <5 <5  ALKPHOS 74 85  BILITOT 0.5 0.5  PROT 6.2* 7.1  ALBUMIN 2.9* 3.2*   No results for input(s): LIPASE, AMYLASE in the last 168 hours.  No results for input(s): AMMONIA in the last 168 hours. Diabetic: No results for input(s): HGBA1C in the last 72 hours. Recent Labs  Lab 04/08/24 0458 04/08/24 1143 04/08/24 1704 04/08/24 2357 04/09/24 0639  GLUCAP 142* 139* 132* 130* 132*   Cardiac Enzymes: No results for input(s): CKTOTAL, CKMB, CKMBINDEX, TROPONINI in the last 168 hours. No results for input(s): PROBNP in the last 8760 hours. Coagulation Profile: No results for input(s): INR, PROTIME in the last 168 hours. Thyroid  Function Tests: No results for input(s): TSH, T4TOTAL, FREET4, T3FREE, THYROIDAB in the last 72 hours. Lipid Profile: No results for input(s): CHOL, HDL, LDLCALC, TRIG, CHOLHDL, LDLDIRECT in the last 72 hours. Anemia Panel: No results for input(s): VITAMINB12, FOLATE, FERRITIN, TIBC, IRON, RETICCTPCT in the last 72 hours. Urine analysis:    Component Value Date/Time   COLORURINE STRAW  (A) 03/27/2024 2012   APPEARANCEUR CLEAR 03/27/2024 2012   LABSPEC 1.013 03/27/2024 2012   PHURINE 7.0 03/27/2024 2012   GLUCOSEU NEGATIVE 03/27/2024 2012   GLUCOSEU NEGATIVE 07/23/2010 1623   HGBUR NEGATIVE 03/27/2024 2012   BILIRUBINUR NEGATIVE 03/27/2024 2012   KETONESUR NEGATIVE 03/27/2024 2012   PROTEINUR NEGATIVE 03/27/2024 2012   UROBILINOGEN 0.2 07/23/2010 1623   NITRITE NEGATIVE 03/27/2024 2012   LEUKOCYTESUR NEGATIVE 03/27/2024 2012   Sepsis Labs: Invalid input(s): PROCALCITONIN, LACTICIDVEN  Microbiology: No results found for this or any previous visit (from the past 240 hours).  Radiology Studies: CT ABDOMEN PELVIS W CONTRAST Result Date: 04/08/2024 EXAM: CT ABDOMEN AND PELVIS WITH CONTRAST 04/08/2024 10:35:18 AM TECHNIQUE: CT of the abdomen and pelvis was performed with the administration of 100 mL of iohexol  (OMNIPAQUE ) 300 MG/ML solution. Multiplanar reformatted images are provided for review. Automated exposure control, iterative reconstruction, and/or weight-based adjustment of the mA/kV was utilized to reduce the radiation dose to as low as reasonably achievable. COMPARISON: 03/27/2024 CLINICAL HISTORY: Bowel obstruction suspected. FINDINGS: LOWER CHEST: Mild subpleural dependent changes within the posterior right base. Small pericardial effusion. Mild cardiac enlargement. Coronary artery calcifications. LIVER: No suspicious liver lesion. GALLBLADDER AND BILE DUCTS: Status post cholecystectomy. No signs of bile duct dilatation. SPLEEN: The spleen is unremarkable. PANCREAS: Normal appearance of the pancreas. ADRENAL GLANDS: No acute abnormality. KIDNEYS, URETERS AND BLADDER: No stones in the kidneys or ureters. No hydronephrosis. No perinephric or periureteral stranding. Urinary bladder is unremarkable. GI AND BOWEL: Enteric contrast material is identified within the distended stomach. An NG tube is in place, which appears looped within the body of the stomach with the tip  in the gastric fundus. Enteric contrast material passes from the stomach into the small bowel loops up to the level of the splenic flexure. Mild generalized small bowel wall edema is noted throughout the left hemiabdomen, lower abdomen, and pelvis. Mild diffuse small bowel dilatation noted which measures up to 2.6 cm in diameter with a few scattered air-fluid levels. Small bowel anastomosis noted within the right lower quadrant of the abdomen. Signs of previous small bowel resection with anastomosis identified in the right hemiabdomen. Enteric contrast material is identified distal to the anastomosis within the colon. No signs of extravasation of the enteric contrast material to suggest anastomosis dehiscence. There is decreased caliber of the terminal ileum, which contains contrast material up to the ileocecal valve. PERITONEUM AND RETROPERITONEUM: A small volume of pneumoperitoneum. Within the left lower quadrant of the  abdomen along the undersurface of the ventral abdominal wall there is a focal collection of complex fluid and gas which measures 4.5 x 2.7 cm and 69 HU, image 662. This is favored to represent a postoperative hematoma. Within the posterior pelvis, there is a fluid collection anterior to the rectum with peripheral mural enhancement measuring 4.8 x 1.8 x 3.2 cm, image 69/2. No additional focal fluid collections identified. VASCULATURE: Aorta is normal in caliber. Aortic atherosclerosis. LYMPH NODES: No signs of abdominopelvic adenopathy. REPRODUCTIVE ORGANS: Status post hysterectomy. No adnexal mass identified. BONES AND SOFT TISSUES: Mild diffuse bony marrow edema. No acute or suspicious osseous findings. Postoperative changes with midline skin staples identified along the ventral abdominal wall. IMPRESSION: 1. Mild diffuse small bowel dilatation with air-fluid levels and mild generalized small bowel wall edema. Transition to distal small bowel noted at the level of the anastomosis in the right  hemipelvis with normal caliber terminal ileum. Enteric contrast material, however, is noted throughout the small bowel, beyond the anastomosis and into the colon which suggest the small bowel dilatation is due to either a postoperative ileus or partial obstruction at the level of the anastomosis. 2. Fluid collection in the posterior pelvis anterior to the rectum with peripheral mural enhancement. Cannot exclude underlying abscess. 3. Small volume pneumoperitoneum, likely postoperative . 4. Focal collection of complex fluid and gas in the left lower quadrant, favored postoperative hematoma. Electronically signed by: Waddell Calk MD 04/08/2024 03:11 PM EST RP Workstation: [id number]         Joaquin Knebel T. Keno Caraway [facility]  If 7PM-7AM, please contact night-coverage [email or web address] 04/09/2024, 10:30 AM

## 2024-04-09 NOTE — Progress Notes (Signed)
 PHARMACY - TOTAL PARENTERAL NUTRITION CONSULT NOTE   Indication: Prolonged ileus  Patient Measurements: Height: 5' 1 (154.9 cm) Weight: 51.2 kg (112 lb 14 oz) IBW/kg (Calculated) : 47.8 TPN AdjBW (KG): 52.2 Body mass index is 21.33 kg/m. Usual Weight:   Assessment: Patient is a 73 y.o F who presented to the ED on 03/27/24 with c/o abdominal pain and n/v. Abdominal CT on 03/27/24 showed SBO.   She subsequently underwent exp lap with small bowel resection, lysis of adhesions and enterorrhaphy on 04/01/24.  Pharmacy has been consulted on 04/05/24 to start TPN for prolonged ileus.  Glucose / Insulin : no hx DM.  CBGs: 130-142 (goal range 140-180 mg/dL).  SSI / 24 hrs: 5 units Electrolytes: Na and Cl low, Others WNL including CorrCa 9.14 - goal for ileus: Mag >2 and K >4 Renal: Scr <1, BUN WNL Hepatic: LFTs wnl, Albumin 3.2 Intake / Output; MIVF:   - mIVF d/c on 11/20 - Output: urine 2.6 L, NG 1110 mL.  Net I/O -1.5 L GI Imaging: 11/18 Abd xray: Reduced small bowel dilatation  11/20 CT: small bowel dilatation, possibly due to postoperative ileus or partial obstruction at the level of the anastomosis. GI Surgeries / Procedures:  - 11/9: unsuccessful  placement of NG tube - 11/10: NGT - 11/13: exp lap with small bowel resection, lysis of adhesions and enterorrhaphy.   Central access: PICC 11/17 TPN start date: 04/05/24   Nutritional Goals: Goal TPN rate is 70 mL/hr (provides 70 g of protein and 1632 kcals per day)  RD Assessment: Estimated Needs Total Energy Estimated Needs: 1550-1750 Total Protein Estimated Needs: 65-80g Total Fluid Estimated Needs: 1.7L/day  Current Nutrition:  - NPO, TPN - NG tube to LIWS  Plan:  At 18:00 Continue TPN at goal rate 70 ml/hr - Electrolytes in TPN:  Na 120 mEq/L K 35 mEq/L Ca 5 mEq/L Mg 2.5 mEq/L  Phos 20 mmol/L Cl:Ac = Max Cl - Continue standard MVI and trace elements in TPN - Continue Sensitive q6h SSI and adjust as needed  -  Thiamine  100 mg IV daily x 5 days (11/17-11/21) - Monitor TPN labs on Mon/Thurs and PRN   Wanda Hasting PharmD, BCPS [facility] main pharmacy [phone] 04/09/2024 8:58 AM

## 2024-04-09 NOTE — Plan of Care (Signed)

## 2024-04-09 NOTE — Progress Notes (Signed)
 8 Days Post-Op  Subjective: No bowel function yet.  No new complaints, except sore throat.  Patient very forgetful this morning.  1100cc out of NGT yesterday  Objective: Vital signs in last 24 hours: Temp:  [98.6 F (37 C)-98.7 F (37.1 C)] 98.7 F (37.1 C) (11/21 0641) Pulse Rate:  [89-100] 89 (11/21 0641) Resp:  [16-17] 16 (11/21 0641) BP: (117-149)/(87-93) 117/87 (11/21 0641) SpO2:  [99 %] 99 % (11/21 0641) Weight:  [51.2 kg] 51.2 kg (11/21 0500) Last BM Date : 03/26/24  Intake/Output from previous day: 11/20 0701 - 11/21 0700 In: 2235.6 [I.V.:1235.6; NG/GT:1000] Out: 3710 [Urine:2600; Emesis/NG output:1110] Intake/Output this shift: Total I/O In: 1117.5 [I.V.:1117.5] Out: 300 [Urine:300]  PE: Gen: NAD HEENT: NGT in place with bilious output, 1100cc noted yesterday Abd: soft, appropriately tender, ND, incision c/d/I with staples   Lab Results:  Recent Labs    04/09/24 0010  WBC 17.0*  HGB 11.1*  HCT 32.7*  PLT 349   BMET Recent Labs    04/08/24 0135 04/09/24 0010  NA 130* 130*  K 4.6 4.7  CL 95* 96*  CO2 27 27  GLUCOSE 132* 124*  BUN 11 15  CREATININE 0.58 0.67  CALCIUM  8.8* 8.5*   PT/INR No results for input(s): LABPROT, INR in the last 72 hours. CMP     Component Value Date/Time   NA 130 (L) 04/09/2024 0010   NA 142 08/09/2022 0913   K 4.7 04/09/2024 0010   CL 96 (L) 04/09/2024 0010   CO2 27 04/09/2024 0010   GLUCOSE 124 (H) 04/09/2024 0010   BUN 15 04/09/2024 0010   BUN 9 08/09/2022 0913   CREATININE 0.67 04/09/2024 0010   CALCIUM  8.5 (L) 04/09/2024 0010   PROT 7.1 04/08/2024 0135   PROT 6.6 08/09/2022 0913   ALBUMIN 3.2 (L) 04/08/2024 0135   ALBUMIN 4.0 08/09/2022 0913   AST 30 04/08/2024 0135   ALT <5 04/08/2024 0135   ALKPHOS 85 04/08/2024 0135   BILITOT 0.5 04/08/2024 0135   BILITOT 0.7 08/09/2022 0913   GFRNONAA >60 04/09/2024 0010   GFRAA >60 12/31/2019 1156   Lipase     Component Value Date/Time   LIPASE 36  03/27/2024 1833       Studies/Results: CT ABDOMEN PELVIS W CONTRAST Result Date: 04/08/2024 EXAM: CT ABDOMEN AND PELVIS WITH CONTRAST 04/08/2024 10:35:18 AM TECHNIQUE: CT of the abdomen and pelvis was performed with the administration of 100 mL of iohexol  (OMNIPAQUE ) 300 MG/ML solution. Multiplanar reformatted images are provided for review. Automated exposure control, iterative reconstruction, and/or weight-based adjustment of the mA/kV was utilized to reduce the radiation dose to as low as reasonably achievable. COMPARISON: 03/27/2024 CLINICAL HISTORY: Bowel obstruction suspected. FINDINGS: LOWER CHEST: Mild subpleural dependent changes within the posterior right base. Small pericardial effusion. Mild cardiac enlargement. Coronary artery calcifications. LIVER: No suspicious liver lesion. GALLBLADDER AND BILE DUCTS: Status post cholecystectomy. No signs of bile duct dilatation. SPLEEN: The spleen is unremarkable. PANCREAS: Normal appearance of the pancreas. ADRENAL GLANDS: No acute abnormality. KIDNEYS, URETERS AND BLADDER: No stones in the kidneys or ureters. No hydronephrosis. No perinephric or periureteral stranding. Urinary bladder is unremarkable. GI AND BOWEL: Enteric contrast material is identified within the distended stomach. An NG tube is in place, which appears looped within the body of the stomach with the tip in the gastric fundus. Enteric contrast material passes from the stomach into the small bowel loops up to the level of the splenic flexure. Mild generalized  small bowel wall edema is noted throughout the left hemiabdomen, lower abdomen, and pelvis. Mild diffuse small bowel dilatation noted which measures up to 2.6 cm in diameter with a few scattered air-fluid levels. Small bowel anastomosis noted within the right lower quadrant of the abdomen. Signs of previous small bowel resection with anastomosis identified in the right hemiabdomen. Enteric contrast material is identified distal to the  anastomosis within the colon. No signs of extravasation of the enteric contrast material to suggest anastomosis dehiscence. There is decreased caliber of the terminal ileum, which contains contrast material up to the ileocecal valve. PERITONEUM AND RETROPERITONEUM: A small volume of pneumoperitoneum. Within the left lower quadrant of the abdomen along the undersurface of the ventral abdominal wall there is a focal collection of complex fluid and gas which measures 4.5 x 2.7 cm and 69 HU, image 662. This is favored to represent a postoperative hematoma. Within the posterior pelvis, there is a fluid collection anterior to the rectum with peripheral mural enhancement measuring 4.8 x 1.8 x 3.2 cm, image 69/2. No additional focal fluid collections identified. VASCULATURE: Aorta is normal in caliber. Aortic atherosclerosis. LYMPH NODES: No signs of abdominopelvic adenopathy. REPRODUCTIVE ORGANS: Status post hysterectomy. No adnexal mass identified. BONES AND SOFT TISSUES: Mild diffuse bony marrow edema. No acute or suspicious osseous findings. Postoperative changes with midline skin staples identified along the ventral abdominal wall. IMPRESSION: 1. Mild diffuse small bowel dilatation with air-fluid levels and mild generalized small bowel wall edema. Transition to distal small bowel noted at the level of the anastomosis in the right hemipelvis with normal caliber terminal ileum. Enteric contrast material, however, is noted throughout the small bowel, beyond the anastomosis and into the colon which suggest the small bowel dilatation is due to either a postoperative ileus or partial obstruction at the level of the anastomosis. 2. Fluid collection in the posterior pelvis anterior to the rectum with peripheral mural enhancement. Cannot exclude underlying abscess. 3. Small volume pneumoperitoneum, likely postoperative . 4. Focal collection of complex fluid and gas in the left lower quadrant, favored postoperative hematoma.  Electronically signed by: Waddell Calk MD 04/08/2024 03:11 PM EST RP Workstation: [id number]     Anti-infectives: Anti-infectives (From admission, onward)    Start     Dose/Rate Route Frequency Ordered Stop   04/01/24 0800  cefTRIAXone (ROCEPHIN) 2 g in sodium chloride  0.9 % 100 mL IVPB  Status:  Discontinued        2 g 200 mL/hr over 30 Minutes Intravenous On call to O.R. 04/01/24 0700 04/01/24 0731   04/01/24 0730  cefoTEtan  (CEFOTAN ) 2 g in sodium chloride  0.9 % 100 mL IVPB        2 g 200 mL/hr over 30 Minutes Intravenous On call to O.R. 04/01/24 0640 04/01/24 1924   03/31/24 1300  cefoTEtan  (CEFOTAN ) 2 g in sodium chloride  0.9 % 100 mL IVPB  Status:  Discontinued        2 g 200 mL/hr over 30 Minutes Intravenous On call to O.R. 03/31/24 0909 03/31/24 1601   03/31/24 0945  cefTRIAXone (ROCEPHIN) 2 g in sodium chloride  0.9 % 100 mL IVPB  Status:  Discontinued        2 g 200 mL/hr over 30 Minutes Intravenous On call to O.R. 03/31/24 0854 03/31/24 0909        Assessment/Plan POD 8, s/p ex lap with LOA and SBR for SBO, Dr. Teresa 11/13 -failed NGT clamping trial 2 days ago.  CT yesterday reviewed.  Has  ileus appearing picture but contrast made it to her colon.  Still with high NGT output.  Small fluid collection in pelvis, doubt that's the etiology of her ileus or even her WBC, but monitor.  Doesn't have any other infectious symptoms currently. -given prolonged NPO status, picc/TNA.  -mobilize, pulm toilet -multi-modal pain control -d/w primary service on ward  FEN - NPO/NGT/IVFs/PICC/TNA VTE - Lovenox  ID - rocephin on call to OR, no further needed, would monitor WBC prior to initiating new abx, unclear etiology of etiology currently    LOS: 13 days   Burnard FORBES Banter, [facility]

## 2024-04-09 NOTE — Progress Notes (Signed)
 Physical Therapy Treatment Patient Details Name: Dominique Mooney MRN: [id number] DOB: Nov 13, 1950 Today's Date: 04/09/2024   History of Present Illness Dominique Mooney is a 73 yo female, s/p ex lap, lysis of adhesions and enterorrhapy (repair of small bowel serosa) 11/13 for SBO. PMH: nonobstructive CAD, CKD-3A, memory loss,  DILI due to statin, post ERCP pancreatitis in 2000, asthma, HTN and HLD    PT Comments  Pt up in recliner, RN in room clamping NG tube. Pt pleasantly confused, unaware of NG tube being present for multiple days and wondering why she can't eat despite PA educating pt. Pt mobilizing with CGA to supv, therapist managing lines for safety. Pt amb 400 ft with RW, step through gait pattern, trunk slightly forward flexed, able to navigate around obstacles and complete turns without LOB. Pt without complaints during session, reports nausea resolved with medication. 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   If plan is discharge home, recommend the following: A little help with walking and/or transfers;A little help with bathing/dressing/bathroom;Assistance with cooking/housework;Help with stairs or ramp for entrance;Assist for transportation   Can travel by private vehicle        Equipment Recommendations  None recommended by PT    Recommendations for Other Services       Precautions / Restrictions Precautions Precautions: Fall Recall of Precautions/Restrictions: Impaired Precaution/Restrictions Comments: abdominal surgery, NG, TPN Restrictions Weight Bearing Restrictions Per Provider Order: No     Mobility  Bed Mobility               General bed mobility comments: in recliner upon arrival    Transfers Overall transfer level: Needs assistance Equipment used: Rolling walker (2 wheels) Transfers: Sit to/from Stand Sit to Stand: Contact guard assist           General transfer comment: CGA for steadying to power up, cues for hand placement,  therapist managing lines for safety    Ambulation/Gait Ambulation/Gait assistance: Supervision Gait Distance (Feet): 400 Feet Assistive device: Rolling walker (2 wheels) Gait Pattern/deviations: Decreased stride length, Step-through pattern Gait velocity: decreased     General Gait Details: step through gait pattern, trunk slightly forward flexed, pt easily distracted in hallway with gait veering towards eye gaze   Stairs             Wheelchair Mobility     Tilt Bed    Modified Rankin (Stroke Patients Only)       Balance Overall balance assessment: Needs assistance         Standing balance support: Reliant on assistive device for balance, During functional activity, Bilateral upper extremity supported Standing balance-Leahy Scale: Poor                              Communication Communication Communication: No apparent difficulties  Cognition Arousal: Alert Behavior During Therapy: WFL for tasks assessed/performed   PT - Cognitive impairments: History of cognitive impairments                       PT - Cognition Comments: pt able to state name and DOB appropriately; pt unaware of NG tube and asking why throat is sore, pt also perseverating on not eating for days despite PA educating pt prior to treatment Following commands: Impaired Following commands impaired: Follows one step commands with increased time    Cueing Cueing Techniques: Verbal cues, Gestural cues, Tactile cues, Visual cues  Exercises  General Comments        Pertinent Vitals/Pain Pain Assessment Pain Assessment: Faces Faces Pain Scale: Hurts a little bit Pain Location: abdomen Pain Descriptors / Indicators:  (nausea) Pain Intervention(s): Limited activity within patient's tolerance, Monitored during session (nausea medicine administered during session)    Home Living                          Prior Function            PT Goals (current  goals can now be found in the care plan section) Acute Rehab PT Goals Patient Stated Goal: none stated PT Goal Formulation: With patient/family Time For Goal Achievement: 04/21/24 Potential to Achieve Goals: Good Progress towards PT goals: Progressing toward goals    Frequency    Min 3X/week      PT Plan      Co-evaluation              AM-PAC PT 6 Clicks Mobility   Outcome Measure  Help needed turning from your back to your side while in a flat bed without using bedrails?: A Little Help needed moving from lying on your back to sitting on the side of a flat bed without using bedrails?: A Little Help needed moving to and from a bed to a chair (including a wheelchair)?: A Little Help needed standing up from a chair using your arms (e.g., wheelchair or bedside chair)?: A Little Help needed to walk in hospital room?: A Little Help needed climbing 3-5 steps with a railing? : A Lot 6 Click Score: 17    End of Session Equipment Utilized During Treatment: Gait belt Activity Tolerance: Patient tolerated treatment well Patient left: in chair;with call bell/phone within reach Nurse Communication: Mobility status;Other (comment) (needs chair alarm) PT Visit Diagnosis: Muscle weakness (generalized) (M62.81);Pain Pain - Right/Left:  (abdominal)     Time: 8883-8862 PT Time Calculation (min) (ACUTE ONLY): 21 min  Charges:    $Gait Training: 8-22 mins PT General Charges $$ ACUTE PT VISIT: 1 Visit                     Tori Arnold Kester PT, DPT 04/09/24, 1:17 PM

## 2024-04-10 DIAGNOSIS — N1831 Chronic kidney disease, stage 3a: Secondary | ICD-10-CM | POA: Diagnosis not present

## 2024-04-10 DIAGNOSIS — I2510 Atherosclerotic heart disease of native coronary artery without angina pectoris: Secondary | ICD-10-CM | POA: Diagnosis not present

## 2024-04-10 DIAGNOSIS — E782 Mixed hyperlipidemia: Secondary | ICD-10-CM | POA: Diagnosis not present

## 2024-04-10 DIAGNOSIS — K56609 Unspecified intestinal obstruction, unspecified as to partial versus complete obstruction: Secondary | ICD-10-CM | POA: Diagnosis not present

## 2024-04-10 LAB — CBC
HCT: 27.8 % — ABNORMAL LOW (ref 36.0–46.0)
Hemoglobin: 9.2 g/dL — ABNORMAL LOW (ref 12.0–15.0)
MCH: 30.7 pg (ref 26.0–34.0)
MCHC: 33.1 g/dL (ref 30.0–36.0)
MCV: 92.7 fL (ref 80.0–100.0)
Platelets: 343 K/uL (ref 150–400)
RBC: 3 MIL/uL — ABNORMAL LOW (ref 3.87–5.11)
RDW: 12.6 % (ref 11.5–15.5)
WBC: 12.4 K/uL — ABNORMAL HIGH (ref 4.0–10.5)
nRBC: 0 % (ref 0.0–0.2)

## 2024-04-10 LAB — GLUCOSE, CAPILLARY
Glucose-Capillary: 118 mg/dL — ABNORMAL HIGH (ref 70–99)
Glucose-Capillary: 114 mg/dL — ABNORMAL HIGH (ref 70–99)

## 2024-04-10 LAB — BASIC METABOLIC PANEL WITH GFR
Anion gap: 9 (ref 5–15)
BUN: 21 mg/dL (ref 8–23)
CO2: 23 mmol/L (ref 22–32)
Calcium: 8.2 mg/dL — ABNORMAL LOW (ref 8.9–10.3)
Chloride: 103 mmol/L (ref 98–111)
Creatinine, Ser: 0.7 mg/dL (ref 0.44–1.00)
GFR, Estimated: >60 mL/min (ref >60)
Glucose, Bld: 106 mg/dL — ABNORMAL HIGH (ref 70–99)
Potassium: 4.6 mmol/L (ref 3.5–5.1)
Sodium: 134 mmol/L — ABNORMAL LOW (ref 135–145)

## 2024-04-10 LAB — MAGNESIUM: Magnesium: 2.6 mg/dL — ABNORMAL HIGH (ref 1.7–2.4)

## 2024-04-10 LAB — PHOSPHORUS: Phosphorus: 3.3 mg/dL (ref 2.5–4.6)

## 2024-04-10 MED ORDER — TRAVASOL 10 % IV SOLN
INTRAVENOUS | Status: AC
  Filled 2024-04-10: qty 705.6

## 2024-04-10 NOTE — Progress Notes (Signed)
 9 Days Post-Op   Subjective/Chief Complaint: Still no bowel function   Objective: Vital signs in last 24 hours: Temp:  [98.2 F (36.8 C)-99.1 F (37.3 C)] 98.6 F (37 C) (11/22 0513) Pulse Rate:  [75-95] 75 (11/22 0513) Resp:  [15-16] 15 (11/22 0513) BP: (98-107)/(72-76) 107/72 (11/22 0513) SpO2:  [99 %-100 %] 99 % (11/22 0513) Weight:  [53 kg] 53 kg (11/22 0500) Last BM Date : 03/26/24  Intake/Output from previous day: 11/21 0701 - 11/22 0700 In: 1671.6 [I.V.:1671.6] Out: 1700 [Urine:1500; Emesis/NG output:200] Intake/Output this shift: Total I/O In: -  Out: 300 [Urine:300]  Ab soft nontender incision clean  Lab Results:  Recent Labs    04/09/24 0010 04/10/24 0328  WBC 17.0* 12.4*  HGB 11.1* 9.2*  HCT 32.7* 27.8*  PLT 349 343   BMET Recent Labs    04/09/24 0010 04/10/24 0328  NA 130* 134*  K 4.7 4.6  CL 96* 103  CO2 27 23  GLUCOSE 124* 106*  BUN 15 21  CREATININE 0.67 0.70  CALCIUM  8.5* 8.2*   PT/INR No results for input(s): LABPROT, INR in the last 72 hours. ABG No results for input(s): PHART, HCO3 in the last 72 hours.  Invalid input(s): PCO2, PO2  Studies/Results: CT ABDOMEN PELVIS W CONTRAST Result Date: 04/08/2024 EXAM: CT ABDOMEN AND PELVIS WITH CONTRAST 04/08/2024 10:35:18 AM TECHNIQUE: CT of the abdomen and pelvis was performed with the administration of 100 mL of iohexol  (OMNIPAQUE ) 300 MG/ML solution. Multiplanar reformatted images are provided for review. Automated exposure control, iterative reconstruction, and/or weight-based adjustment of the mA/kV was utilized to reduce the radiation dose to as low as reasonably achievable. COMPARISON: 03/27/2024 CLINICAL HISTORY: Bowel obstruction suspected. FINDINGS: LOWER CHEST: Mild subpleural dependent changes within the posterior right base. Small pericardial effusion. Mild cardiac enlargement. Coronary artery calcifications. LIVER: No suspicious liver lesion. GALLBLADDER AND BILE  DUCTS: Status post cholecystectomy. No signs of bile duct dilatation. SPLEEN: The spleen is unremarkable. PANCREAS: Normal appearance of the pancreas. ADRENAL GLANDS: No acute abnormality. KIDNEYS, URETERS AND BLADDER: No stones in the kidneys or ureters. No hydronephrosis. No perinephric or periureteral stranding. Urinary bladder is unremarkable. GI AND BOWEL: Enteric contrast material is identified within the distended stomach. An NG tube is in place, which appears looped within the body of the stomach with the tip in the gastric fundus. Enteric contrast material passes from the stomach into the small bowel loops up to the level of the splenic flexure. Mild generalized small bowel wall edema is noted throughout the left hemiabdomen, lower abdomen, and pelvis. Mild diffuse small bowel dilatation noted which measures up to 2.6 cm in diameter with a few scattered air-fluid levels. Small bowel anastomosis noted within the right lower quadrant of the abdomen. Signs of previous small bowel resection with anastomosis identified in the right hemiabdomen. Enteric contrast material is identified distal to the anastomosis within the colon. No signs of extravasation of the enteric contrast material to suggest anastomosis dehiscence. There is decreased caliber of the terminal ileum, which contains contrast material up to the ileocecal valve. PERITONEUM AND RETROPERITONEUM: A small volume of pneumoperitoneum. Within the left lower quadrant of the abdomen along the undersurface of the ventral abdominal wall there is a focal collection of complex fluid and gas which measures 4.5 x 2.7 cm and 69 HU, image 662. This is favored to represent a postoperative hematoma. Within the posterior pelvis, there is a fluid collection anterior to the rectum with peripheral mural enhancement measuring 4.8  x 1.8 x 3.2 cm, image 69/2. No additional focal fluid collections identified. VASCULATURE: Aorta is normal in caliber. Aortic atherosclerosis.  LYMPH NODES: No signs of abdominopelvic adenopathy. REPRODUCTIVE ORGANS: Status post hysterectomy. No adnexal mass identified. BONES AND SOFT TISSUES: Mild diffuse bony marrow edema. No acute or suspicious osseous findings. Postoperative changes with midline skin staples identified along the ventral abdominal wall. IMPRESSION: 1. Mild diffuse small bowel dilatation with air-fluid levels and mild generalized small bowel wall edema. Transition to distal small bowel noted at the level of the anastomosis in the right hemipelvis with normal caliber terminal ileum. Enteric contrast material, however, is noted throughout the small bowel, beyond the anastomosis and into the colon which suggest the small bowel dilatation is due to either a postoperative ileus or partial obstruction at the level of the anastomosis. 2. Fluid collection in the posterior pelvis anterior to the rectum with peripheral mural enhancement. Cannot exclude underlying abscess. 3. Small volume pneumoperitoneum, likely postoperative . 4. Focal collection of complex fluid and gas in the left lower quadrant, favored postoperative hematoma. Electronically signed by: Waddell Calk MD 04/08/2024 03:11 PM EST RP Workstation: [id number]    Anti-infectives: Anti-infectives (From admission, onward)    Start     Dose/Rate Route Frequency Ordered Stop   04/01/24 0800  cefTRIAXone (ROCEPHIN) 2 g in sodium chloride  0.9 % 100 mL IVPB  Status:  Discontinued        2 g 200 mL/hr over 30 Minutes Intravenous On call to O.R. 04/01/24 0700 04/01/24 0731   04/01/24 0730  cefoTEtan  (CEFOTAN ) 2 g in sodium chloride  0.9 % 100 mL IVPB        2 g 200 mL/hr over 30 Minutes Intravenous On call to O.R. 04/01/24 0640 04/01/24 1924   03/31/24 1300  cefoTEtan  (CEFOTAN ) 2 g in sodium chloride  0.9 % 100 mL IVPB  Status:  Discontinued        2 g 200 mL/hr over 30 Minutes Intravenous On call to O.R. 03/31/24 0909 03/31/24 1601   03/31/24 0945  cefTRIAXone (ROCEPHIN) 2 g in  sodium chloride  0.9 % 100 mL IVPB  Status:  Discontinued        2 g 200 mL/hr over 30 Minutes Intravenous On call to O.R. 03/31/24 0854 03/31/24 0909       Assessment/Plan: POD 9 s/p ex lap with LOA and SBR for SBO, Dr. Teresa 11/13 -failed NGT clamping trial 2 days ago.  CT yesterday reviewed.  Has ileus appearing picture but contrast made it to her colon.  Still with high NGT output.  Small fluid collection in pelvis, doubt that's the etiology of her ileus or even her WBC, but monitor.  - picc/TNA.  -mobilize, pulm toilet -multi-modal pain control   FEN - NPO/NGT/IVFs/PICC/TNA VTE - Lovenox  ID - rocephin on call to OR, no further needed, wbc better today    Donnice Bury 04/10/2024

## 2024-04-10 NOTE — Progress Notes (Signed)
 PHARMACY - TOTAL PARENTERAL NUTRITION CONSULT NOTE   Indication: Prolonged ileus  Patient Measurements: Height: 5' 1 (154.9 cm) Weight: 53 kg (116 lb 13.5 oz) IBW/kg (Calculated) : 47.8 TPN AdjBW (KG): 52.2 Body mass index is 22.08 kg/m. Usual Weight:   Assessment: Patient is a 73 y.o F who presented to the ED on 03/27/24 with c/o abdominal pain and n/v. Abdominal CT on 03/27/24 showed SBO.   She subsequently underwent exp lap with small bowel resection, lysis of adhesions and enterorrhaphy on 04/01/24.  Pharmacy has been consulted on 04/05/24 to start TPN for prolonged ileus.  Glucose / Insulin : no hx DM.  CBGs: 105 - 122  (goal range 140-180 mg/dL).  SSI / 24 hrs: 2 units. Will DC SSI & CBGs Electrolytes: Na 130> 134, Mag slightly elevated at 2.6; Others WNL including CorrCa 8.84 - goal for ileus: Mag >2 and K >4 Renal: Scr <1, BUN WNL Hepatic: LFTs wnl, Albumin 3.2 Intake / Output; MIVF:   - mIVF d/c on 11/20 - Output: urine 1.5 L, NG 200 mL.  Net I/O -27 ml - no BM recorded  GI Imaging: 11/18 Abd xray: Reduced small bowel dilatation  11/20 CT: small bowel dilatation, possibly due to postoperative ileus or partial obstruction at the level of the anastomosis. GI Surgeries / Procedures:  - 11/9: unsuccessful  placement of NG tube - 11/10: NGT - 11/13: exp lap with small bowel resection, lysis of adhesions and enterorrhaphy.   Central access: PICC 11/17 TPN start date: 04/05/24   Nutritional Goals: Goal TPN rate is 70 mL/hr (provides 70 g of protein and 1632 kcals per day)  RD Assessment: Estimated Needs Total Energy Estimated Needs: 1550-1750 Total Protein Estimated Needs: 65-80g Total Fluid Estimated Needs: 1.7L/day  Current Nutrition:  - NPO, TPN - NG tube to LIWS  Plan:  At 18:00 Continue TPN at goal rate 70 ml/hr - Electrolytes in TPN:  Na 120 mEq/L K 30 mEq/L Ca 5 mEq/L Mg 1 mEq/L  Phos 20 mmol/L Cl:Ac = 1:1 - Continue standard MVI and trace elements in  TPN - DC SSI & DC CBGs - Thiamine  100 mg IV daily x 5 days completed (11/17-11/21) - Monitor TPN labs on Mon/Thurs and PRN   Rosaline IVAR Edison, Pharm.D Use secure chat for questions 04/10/2024 9:56 AM

## 2024-04-10 NOTE — Plan of Care (Signed)
  Problem: Clinical Measurements: Goal: Ability to maintain clinical measurements within normal limits will improve Outcome: Progressing   Problem: Activity: Goal: Risk for activity intolerance will decrease Outcome: Progressing   Problem: Nutrition: Goal: Adequate nutrition will be maintained Outcome: Progressing   Problem: Elimination: Goal: Will not experience complications related to bowel motility Outcome: Progressing Goal: Will not experience complications related to urinary retention Outcome: Progressing   Problem: Pain Managment: Goal: General experience of comfort will improve and/or be controlled Outcome: Progressing   Problem: Safety: Goal: Ability to remain free from injury will improve Outcome: Progressing

## 2024-04-10 NOTE — Progress Notes (Signed)
 Mobility Specialist Progress Note:   04/10/24 1011  Mobility  Activity Ambulated with assistance  Level of Assistance Minimal assist, patient does 75% or more  Assistive Device Front wheel walker  Distance Ambulated (ft) 250 ft  Activity Response Tolerated well  Mobility Referral Yes  Mobility visit 1 Mobility  Mobility Specialist Start Time (ACUTE ONLY) [id number]  Mobility Specialist Stop Time (ACUTE ONLY) [id number]  Mobility Specialist Time Calculation (min) (ACUTE ONLY) 14 min   Pt was received in bed and agreed to mobility. Min A sit to stand. No complaints during ambulation. Returned to bed with all needs met and call bell in reach.  [employer] - Mobility Specialist

## 2024-04-10 NOTE — Progress Notes (Signed)
 PROGRESS NOTE  Dominique Mooney FMW:[id number] DOB: 1950-11-28   PCP: Emerick Avelina POUR, PA-C  Patient is from: Home.  Lives alone.  Family checks on her every night.  DOA: 03/27/2024 LOS: 14  Chief complaints Chief Complaint  Patient presents with   Abdominal Pain     Brief Narrative / Interim history: 73 year old F with PMH of nonobstructive CAD, CKD-3A, cognitive impairment, post ERCP pancreatitis in 2000, asthma, HTN and HLD presented to ED with nausea, vomiting and abdominal pain, and admitted with small bowel obstruction.  CT abdomen and pelvis showed SBO with 2 adjacent transition points in RLQ and fecalization of the bowel between the transition point concerning for internal hernia or closed-loop obstruction.    Failed conservative management and underwent ex lap with LOA on 11/13 by Dr. Pizza.  Prolonged ileus postoperatively.  Started TPN on 11/18.  Repeat CT abdomen and pelvis on 11/20 suggested possible ileus or partial SBO, fluid anterior to rectum and in LLQ.  She has some leukocytosis that seems to be improving without antibiotics.  No fever.  Surgery managing  Subjective: Seen and examined earlier this morning.  No major events overnight or this morning.  No complaints.   Assessment and plan: Small bowel obstruction/prolonged postop ileus:  -General surgery managing-NGT, n.p.o. except sips with meds and TPN (started on 11/18). -Leukocytosis improved without antibiotics.  Afebrile -Mobilize patient.  Memory impairment/cognitive impairment: Seems to be at baseline.  MRI brain without acute intracranial abnormality or reversible cause of memory loss. - Reorientation and delirium precaution   Hypothyroidism:TSH was 41.7 on 7/22 secondary to medication nonadherence, improved to 7.73 on recheck 9/23. Synthroid  on hold while NPO.   - Continue IV Synthroid  50 mcg daily.   Hyperlipidemia:Not on statin due to history of drug-induced liver injury.   Nonobstructive CAD:  Stable.  Not on statin due to history of drug-induced liver injury.   Essential hypertension: Not on meds at home.  Normotensive.  Mild intermittent asthma: Stable -Bronchodilators as needed  Hypokalemia/hypophosphatemia -Monitor replenish with TPN.  Hyponatremia: Mild - Monitor  CKD-3A: Stable -Continue monitoring  Leukocytosis: Improved without antibiotics.  Afebrile.  Severe malnutrition Body mass index is 22.08 kg/m. Nutrition Problem: Severe Malnutrition Etiology: acute illness (SBO) Signs/Symptoms: severe muscle depletion, moderate fat depletion, energy intake < 75% for > 7 days Interventions: TPN   DVT prophylaxis:  enoxaparin  (LOVENOX ) injection 40 mg Start: 04/06/24 1200 SCDs Start: 03/27/24 2136  Code Status: Full code Family Communication: None at bedside. Level of care: Med-Surg Status is: Inpatient Remains inpatient appropriate because: SBO/postop ileus   Final disposition: Home   35 minutes with more than 50% spent in reviewing records, counseling patient/family and coordinating care.  Consultants:  General Surgery  Procedures: 11/13-ex lap and LOA by Dr. Pizza  Microbiology summarized: None  Objective: Vitals:   04/09/24 1439 04/09/24 2030 04/10/24 0500 04/10/24 0513  BP: 98/76 101/72  107/72  Pulse: 95 87  75  Resp: 16 16  15   Temp: 98.2 F (36.8 C) 99.1 F (37.3 C)  98.6 F (37 C)  TempSrc: Oral Oral  Oral  SpO2: 100% 99%  99%  Weight:   53 kg   Height:        Examination:  GENERAL: No apparent distress.  Nontoxic. HEENT: MMM.  Vision and hearing grossly intact.  NG tube in place. NECK: Supple.  No apparent JVD.  RESP:  No IWOB.  Fair aeration bilaterally. CVS:  RRR. Heart sounds normal.  ABD/GI/GU: BS+.  Abd soft.  Honeycomb dressing in place. MSK/EXT:  Moves extremities. No apparent deformity. No edema.  SKIN: As above. NEURO: AA.  Oriented to self, place and family.  No apparent focal neuro deficit. PSYCH: Calm. Normal  affect.   Sch Meds:  Scheduled Meds:  Chlorhexidine  Gluconate Cloth  6 each Topical Daily   enoxaparin  (LOVENOX ) injection  40 mg Subcutaneous Q24H   levothyroxine   50 mcg Intravenous Daily   melatonin  3 mg Oral QHS   Continuous Infusions:  TPN ADULT (ION) 70 mL/hr at 04/09/24 1723   TPN ADULT (ION)     PRN Meds:.albuterol , hydrALAZINE , HYDROmorphone  (DILAUDID ) injection, menthol , methocarbamol  (ROBAXIN ) injection, ondansetron  (ZOFRAN ) IV, phenol, sodium chloride  flush  Antimicrobials: Anti-infectives (From admission, onward)    Start     Dose/Rate Route Frequency Ordered Stop   04/01/24 0800  cefTRIAXone (ROCEPHIN) 2 g in sodium chloride  0.9 % 100 mL IVPB  Status:  Discontinued        2 g 200 mL/hr over 30 Minutes Intravenous On call to O.R. 04/01/24 0700 04/01/24 0731   04/01/24 0730  cefoTEtan  (CEFOTAN ) 2 g in sodium chloride  0.9 % 100 mL IVPB        2 g 200 mL/hr over 30 Minutes Intravenous On call to O.R. 04/01/24 0640 04/01/24 1924   03/31/24 1300  cefoTEtan  (CEFOTAN ) 2 g in sodium chloride  0.9 % 100 mL IVPB  Status:  Discontinued        2 g 200 mL/hr over 30 Minutes Intravenous On call to O.R. 03/31/24 0909 03/31/24 1601   03/31/24 0945  cefTRIAXone (ROCEPHIN) 2 g in sodium chloride  0.9 % 100 mL IVPB  Status:  Discontinued        2 g 200 mL/hr over 30 Minutes Intravenous On call to O.R. 03/31/24 0854 03/31/24 0909        I have personally reviewed the following labs and images: CBC: Recent Labs  Lab 04/04/24 0509 04/05/24 0455 04/06/24 0315 04/09/24 0010 04/10/24 0328  WBC 16.8* 12.8* 11.9* 17.0* 12.4*  NEUTROABS  --  9.8*  --   --   --   HGB 10.4* 10.9* 10.4* 11.1* 9.2*  HCT 30.5* 32.9* 30.8* 32.7* 27.8*  MCV 91.0 94.0 92.5 91.1 92.7  PLT 275 308 301 349 343   BMP &GFR Recent Labs  Lab 04/06/24 0315 04/07/24 0254 04/08/24 0135 04/09/24 0010 04/10/24 0328  NA 137 135 130* 130* 134*  K 4.0 4.2 4.6 4.7 4.6  CL 99 97* 95* 96* 103  CO2 31 29 27 27 23    GLUCOSE 145* 131* 132* 124* 106*  BUN <5* 6* 11 15 21   CREATININE 0.63 0.57 0.58 0.67 0.70  CALCIUM  8.8* 9.1 8.8* 8.5* 8.2*  MG 1.9 2.1 2.3 2.4 2.6*  PHOS 1.5* 3.3 3.2 3.6 3.3   Estimated Creatinine Clearance: 48.7 mL/min (by C-G formula based on SCr of 0.7 mg/dL). Liver & Pancreas: Recent Labs  Lab 04/05/24 0455 04/08/24 0135  AST 27 30  ALT <5 <5  ALKPHOS 74 85  BILITOT 0.5 0.5  PROT 6.2* 7.1  ALBUMIN 2.9* 3.2*   No results for input(s): LIPASE, AMYLASE in the last 168 hours.  No results for input(s): AMMONIA in the last 168 hours. Diabetic: No results for input(s): HGBA1C in the last 72 hours. Recent Labs  Lab 04/09/24 1113 04/09/24 1705 04/09/24 2357 04/10/24 0511 04/10/24 0533  GLUCAP 126* 105* 122* 118* 114*   Cardiac Enzymes: No results for input(s): CKTOTAL, CKMB, CKMBINDEX,  TROPONINI in the last 168 hours. No results for input(s): PROBNP in the last 8760 hours. Coagulation Profile: No results for input(s): INR, PROTIME in the last 168 hours. Thyroid  Function Tests: No results for input(s): TSH, T4TOTAL, FREET4, T3FREE, THYROIDAB in the last 72 hours. Lipid Profile: No results for input(s): CHOL, HDL, LDLCALC, TRIG, CHOLHDL, LDLDIRECT in the last 72 hours. Anemia Panel: No results for input(s): VITAMINB12, FOLATE, FERRITIN, TIBC, IRON, RETICCTPCT in the last 72 hours. Urine analysis:    Component Value Date/Time   COLORURINE STRAW (A) 03/27/2024 2012   APPEARANCEUR CLEAR 03/27/2024 2012   LABSPEC 1.013 03/27/2024 2012   PHURINE 7.0 03/27/2024 2012   GLUCOSEU NEGATIVE 03/27/2024 2012   GLUCOSEU NEGATIVE 07/23/2010 1623   HGBUR NEGATIVE 03/27/2024 2012   BILIRUBINUR NEGATIVE 03/27/2024 2012   KETONESUR NEGATIVE 03/27/2024 2012   PROTEINUR NEGATIVE 03/27/2024 2012   UROBILINOGEN 0.2 07/23/2010 1623   NITRITE NEGATIVE 03/27/2024 2012   LEUKOCYTESUR NEGATIVE 03/27/2024 2012   Sepsis  Labs: Invalid input(s): PROCALCITONIN, LACTICIDVEN  Microbiology: No results found for this or any previous visit (from the past 240 hours).  Radiology Studies: No results found.        Joselyn Edling T. Robina Hamor [facility]  If 7PM-7AM, please contact night-coverage [email or web address] 04/10/2024, 10:36 AM

## 2024-04-11 DIAGNOSIS — K56609 Unspecified intestinal obstruction, unspecified as to partial versus complete obstruction: Secondary | ICD-10-CM | POA: Diagnosis not present

## 2024-04-11 LAB — CBC
HCT: 26.5 % — ABNORMAL LOW (ref 36.0–46.0)
Hemoglobin: 8.8 g/dL — ABNORMAL LOW (ref 12.0–15.0)
MCH: 31 pg (ref 26.0–34.0)
MCHC: 33.2 g/dL (ref 30.0–36.0)
MCV: 93.3 fL (ref 80.0–100.0)
Platelets: 329 K/uL (ref 150–400)
RBC: 2.84 MIL/uL — ABNORMAL LOW (ref 3.87–5.11)
RDW: 12.5 % (ref 11.5–15.5)
WBC: 13.2 K/uL — ABNORMAL HIGH (ref 4.0–10.5)
nRBC: 0 % (ref 0.0–0.2)

## 2024-04-11 LAB — BASIC METABOLIC PANEL WITH GFR
Anion gap: 7 (ref 5–15)
BUN: 17 mg/dL (ref 8–23)
CO2: 25 mmol/L (ref 22–32)
Calcium: 8.4 mg/dL — ABNORMAL LOW (ref 8.9–10.3)
Chloride: 103 mmol/L (ref 98–111)
Creatinine, Ser: 0.65 mg/dL (ref 0.44–1.00)
GFR, Estimated: >60 mL/min (ref >60)
Glucose, Bld: 117 mg/dL — ABNORMAL HIGH (ref 70–99)
Potassium: 4.2 mmol/L (ref 3.5–5.1)
Sodium: 134 mmol/L — ABNORMAL LOW (ref 135–145)

## 2024-04-11 LAB — PHOSPHORUS: Phosphorus: 3.2 mg/dL (ref 2.5–4.6)

## 2024-04-11 LAB — MAGNESIUM: Magnesium: 2 mg/dL (ref 1.7–2.4)

## 2024-04-11 MED ORDER — TRAVASOL 10 % IV SOLN
INTRAVENOUS | Status: AC
  Filled 2024-04-11: qty 705.6

## 2024-04-11 NOTE — Progress Notes (Signed)
 PROGRESS NOTE  Dominique Mooney FMW:[id number] DOB: 29-Oct-1950   PCP: Emerick Avelina POUR, PA-C  Patient is from: Home.  Lives alone.  Family checks on her every night.  DOA: 03/27/2024 LOS: 15  Chief complaints Chief Complaint  Patient presents with   Abdominal Pain     Brief Narrative / Interim history: As per prior documentation: 73 year old F with PMH of nonobstructive CAD, CKD-3A, cognitive impairment, post ERCP pancreatitis in 2000, asthma, HTN and HLD presented to ED with nausea, vomiting and abdominal pain, and admitted with small bowel obstruction.  CT abdomen and pelvis showed SBO with 2 adjacent transition points in RLQ and fecalization of the bowel between the transition point concerning for internal hernia or closed-loop obstruction.    Failed conservative management and underwent ex lap with LOA on 11/13 by Dr. Pizza.  Prolonged ileus postoperatively.  Started TPN on 11/18.  Repeat CT abdomen and pelvis on 11/20 suggested possible ileus or partial SBO, fluid anterior to rectum and in LLQ.  She has some leukocytosis that seems to be improving without antibiotics.  No fever.  Surgery managing  04/11/2024: Patient seen alongside patient's daughter and another family member.  No new complaints.  Patient tells me that she has broken wind.  Sodium level of 134.  Positive bowel sounds.  Subjective: No complaints.   Assessment and plan: Small bowel obstruction/prolonged postop ileus:  -General surgery managing-NGT, n.p.o. except sips with meds and TPN (started on 11/18). -Leukocytosis improved without antibiotics.  Afebrile -Mobilize patient. 04/11/2024: Persistent leukocytosis.  WBC of 13.2.  Continue to monitor closely.  Memory impairment/cognitive impairment: Seems to be at baseline.  MRI brain without acute intracranial abnormality or reversible cause of memory loss. - Reorientation and delirium precaution   Hypothyroidism:TSH was 41.7 on 7/22 secondary to medication  nonadherence, improved to 7.73 on recheck 9/23. Synthroid  on hold while NPO.   - Continue IV Synthroid  50 mcg daily.   Hyperlipidemia:Not on statin due to history of drug-induced liver injury.   Nonobstructive CAD: Stable.  Not on statin due to history of drug-induced liver injury.   Essential hypertension: Not on meds at home.  Normotensive.  Mild intermittent asthma: Stable -Bronchodilators as needed  Hypokalemia/hypophosphatemia -Monitor replenish with TPN.  Hyponatremia: Mild - Monitor - Sodium level of 134 today.  CKD-3A: Stable -Continue monitoring  Leukocytosis:  - Afebrile. - Nontoxic looking. - WBC of 13.2 today.  Severe malnutrition Body mass index is 22.08 kg/m. Nutrition Problem: Severe Malnutrition Etiology: acute illness (SBO) Signs/Symptoms: severe muscle depletion, moderate fat depletion, energy intake < 75% for > 7 days Interventions: TPN   DVT prophylaxis:  enoxaparin  (LOVENOX ) injection 40 mg Start: 04/06/24 1200 SCDs Start: 03/27/24 2136  Code Status: Full code Family Communication: Daughter by bedside.. Level of care: Med-Surg Status is: Inpatient Remains inpatient appropriate because: SBO/postop ileus   Final disposition: Home   35 minutes with more than 50% spent in reviewing records, counseling patient/family and coordinating care.  Consultants:  General Surgery  Procedures: 11/13-ex lap and LOA by Dr. Pizza  Microbiology summarized: None  Objective: Vitals:   04/10/24 0513 04/10/24 1403 04/10/24 1949 04/11/24 0503  BP: 107/72 103/71 119/72 118/72  Pulse: 75 79 84 82  Resp: 15 18 14 12   Temp: 98.6 F (37 C) 98.8 F (37.1 C) 98.7 F (37.1 C) 98.5 F (36.9 C)  TempSrc: Oral  Oral Oral  SpO2: 99% 100% 100% 97%  Weight:      Height:  Examination:  GENERAL: Not in any distress.  Awake and alert. HEENT: Patient is pale. NECK: Supple.    RESP: Clear to auscultation. CVS: S1-S2. ABD/GI/GU: Abdomen is soft and  nontender. MSK/EXT: No leg edema.   NEURO: Awake and alert.  Sch Meds:  Scheduled Meds:  Chlorhexidine  Gluconate Cloth  6 each Topical Daily   enoxaparin  (LOVENOX ) injection  40 mg Subcutaneous Q24H   levothyroxine   50 mcg Intravenous Daily   melatonin  3 mg Oral QHS   Continuous Infusions:  TPN ADULT (ION) 70 mL/hr at 04/11/24 0505   PRN Meds:.albuterol , hydrALAZINE , HYDROmorphone  (DILAUDID ) injection, menthol , methocarbamol  (ROBAXIN ) injection, ondansetron  (ZOFRAN ) IV, phenol, sodium chloride  flush  Antimicrobials: Anti-infectives (From admission, onward)    Start     Dose/Rate Route Frequency Ordered Stop   04/01/24 0800  cefTRIAXone (ROCEPHIN) 2 g in sodium chloride  0.9 % 100 mL IVPB  Status:  Discontinued        2 g 200 mL/hr over 30 Minutes Intravenous On call to O.R. 04/01/24 0700 04/01/24 0731   04/01/24 0730  cefoTEtan  (CEFOTAN ) 2 g in sodium chloride  0.9 % 100 mL IVPB        2 g 200 mL/hr over 30 Minutes Intravenous On call to O.R. 04/01/24 0640 04/01/24 1924   03/31/24 1300  cefoTEtan  (CEFOTAN ) 2 g in sodium chloride  0.9 % 100 mL IVPB  Status:  Discontinued        2 g 200 mL/hr over 30 Minutes Intravenous On call to O.R. 03/31/24 0909 03/31/24 1601   03/31/24 0945  cefTRIAXone (ROCEPHIN) 2 g in sodium chloride  0.9 % 100 mL IVPB  Status:  Discontinued        2 g 200 mL/hr over 30 Minutes Intravenous On call to O.R. 03/31/24 0854 03/31/24 0909        I have personally reviewed the following labs and images: CBC: Recent Labs  Lab 04/05/24 0455 04/06/24 0315 04/09/24 0010 04/10/24 0328 04/11/24 0300  WBC 12.8* 11.9* 17.0* 12.4* 13.2*  NEUTROABS 9.8*  --   --   --   --   HGB 10.9* 10.4* 11.1* 9.2* 8.8*  HCT 32.9* 30.8* 32.7* 27.8* 26.5*  MCV 94.0 92.5 91.1 92.7 93.3  PLT 308 301 349 343 329   BMP &GFR Recent Labs  Lab 04/07/24 0254 04/08/24 0135 04/09/24 0010 04/10/24 0328 04/11/24 0300  NA 135 130* 130* 134* 134*  K 4.2 4.6 4.7 4.6 4.2  CL 97* 95*  96* 103 103  CO2 29 27 27 23 25   GLUCOSE 131* 132* 124* 106* 117*  BUN 6* 11 15 21 17   CREATININE 0.57 0.58 0.67 0.70 0.65  CALCIUM  9.1 8.8* 8.5* 8.2* 8.4*  MG 2.1 2.3 2.4 2.6* 2.0  PHOS 3.3 3.2 3.6 3.3 3.2   Estimated Creatinine Clearance: 48.7 mL/min (by C-G formula based on SCr of 0.65 mg/dL). Liver & Pancreas: Recent Labs  Lab 04/05/24 0455 04/08/24 0135  AST 27 30  ALT <5 <5  ALKPHOS 74 85  BILITOT 0.5 0.5  PROT 6.2* 7.1  ALBUMIN 2.9* 3.2*   No results for input(s): LIPASE, AMYLASE in the last 168 hours.  No results for input(s): AMMONIA in the last 168 hours. Diabetic: No results for input(s): HGBA1C in the last 72 hours. Recent Labs  Lab 04/09/24 1113 04/09/24 1705 04/09/24 2357 04/10/24 0511 04/10/24 0533  GLUCAP 126* 105* 122* 118* 114*   Cardiac Enzymes: No results for input(s): CKTOTAL, CKMB, CKMBINDEX, TROPONINI in the last 168  hours. No results for input(s): PROBNP in the last 8760 hours. Coagulation Profile: No results for input(s): INR, PROTIME in the last 168 hours. Thyroid  Function Tests: No results for input(s): TSH, T4TOTAL, FREET4, T3FREE, THYROIDAB in the last 72 hours. Lipid Profile: No results for input(s): CHOL, HDL, LDLCALC, TRIG, CHOLHDL, LDLDIRECT in the last 72 hours. Anemia Panel: No results for input(s): VITAMINB12, FOLATE, FERRITIN, TIBC, IRON, RETICCTPCT in the last 72 hours. Urine analysis:    Component Value Date/Time   COLORURINE STRAW (A) 03/27/2024 2012   APPEARANCEUR CLEAR 03/27/2024 2012   LABSPEC 1.013 03/27/2024 2012   PHURINE 7.0 03/27/2024 2012   GLUCOSEU NEGATIVE 03/27/2024 2012   GLUCOSEU NEGATIVE 07/23/2010 1623   HGBUR NEGATIVE 03/27/2024 2012   BILIRUBINUR NEGATIVE 03/27/2024 2012   KETONESUR NEGATIVE 03/27/2024 2012   PROTEINUR NEGATIVE 03/27/2024 2012   UROBILINOGEN 0.2 07/23/2010 1623   NITRITE NEGATIVE 03/27/2024 2012   LEUKOCYTESUR NEGATIVE  03/27/2024 2012   Sepsis Labs: Invalid input(s): PROCALCITONIN, LACTICIDVEN  Microbiology: No results found for this or any previous visit (from the past 240 hours).  Radiology Studies: No results found.  Time spent: 35 minutes  Leatrice chapel, MD [facility]  If 7PM-7AM, please contact night-coverage [email or web address] 04/11/2024, 10:43 AM

## 2024-04-11 NOTE — Plan of Care (Signed)

## 2024-04-11 NOTE — TOC Progression Note (Addendum)
 Transition of Care [facility]) - Progression Note    Patient Details  Name: Dominique Mooney MRN: [id number] Date of Birth: 03-22-1951  [facility]) CM/SW Contact  Lorraine LILLETTE [place], [place] Phone Number: 04/11/2024, 11:38 AM  Clinical Narrative:    CSW visited patient at bedside to offer HHPT services that were recommended. Pt accepts and had no specific choice. Sent Pt out to DME providers. ICM following.    Addendum: [facility] and [facility] declined. [facility] accepts, added to AVS.    Barriers to Discharge: Continued Medical Work up               Expected Discharge Plan and Services                                               Social Drivers of Health (SDOH) Interventions SDOH Screenings   Food Insecurity: No Food Insecurity (03/27/2024)  Housing: High Risk (03/27/2024)  Transportation Needs: No Transportation Needs (03/27/2024)  Utilities: Not At Risk (03/27/2024)  Social Connections: Unknown (03/27/2024)  Tobacco Use: Low Risk  (04/01/2024)    Readmission Risk Interventions    03/30/2024    4:16 PM  Readmission Risk Prevention Plan  Post Dischage Appt Complete  Medication Screening Complete  Transportation Screening Complete

## 2024-04-11 NOTE — Progress Notes (Signed)
 PHARMACY - TOTAL PARENTERAL NUTRITION CONSULT NOTE   Indication: Prolonged ileus  Patient Measurements: Height: 5' 1 (154.9 cm) Weight: 53 kg (116 lb 13.5 oz) IBW/kg (Calculated) : 47.8 TPN AdjBW (KG): 52.2 Body mass index is 22.08 kg/m. Usual Weight:   Assessment: Patient is a 73 y.o F who presented to the ED on 03/27/24 with c/o abdominal pain and n/v. Abdominal CT on 03/27/24 showed SBO.   She subsequently underwent exp lap with small bowel resection, lysis of adhesions and enterorrhaphy on 04/01/24.  Pharmacy has been consulted on 04/05/24 to start TPN for prolonged ileus.  Glucose / Insulin : no hx DM.  SSI & CBGs DC'd on 11/22. Serum gluc 117 Electrolytes: Na 134, Others WNL including CorrCa 9.04 - goal for ileus: Mag >2 and K >4 Renal: Scr <1, BUN WNL Hepatic: LFTs wnl, Albumin 3.2 Intake / Output; MIVF:   - mIVF d/c on 11/20 - Output: urine 900 ml, NG 250 mL.  Net I/O +1295 ml -11/23 large BM per CCS note  GI Imaging: 11/18 Abd xray: Reduced small bowel dilatation  11/20 CT: small bowel dilatation, possibly due to postoperative ileus or partial obstruction at the level of the anastomosis. GI Surgeries / Procedures:  - 11/13: exp lap with small bowel resection, lysis of adhesions and enterorrhaphy.   Central access: PICC 11/17 TPN start date: 04/05/24   Nutritional Goals: Goal TPN rate is 70 mL/hr (provides 70 g of protein and 1632 kcals per day)  RD Assessment: Estimated Needs Total Energy Estimated Needs: 1550-1750 Total Protein Estimated Needs: 65-80g Total Fluid Estimated Needs: 1.7L/day  Current Nutrition:  - NPO, TPN - 11/23 DC NGT  Plan:  At 18:00 Continue TPN at goal rate 70 ml/hr - Electrolytes in TPN:  Na 120 mEq/L K 30 mEq/L Ca 5 mEq/L Mg 2 mEq/L  Phos 20 mmol/L Cl:Ac = 1:1 - Continue standard MVI and trace elements in TPN - Thiamine  100 mg IV daily x 5 days completed (11/17-11/21) - Monitor TPN labs on Mon/Thurs and PRN - f/u to change IV  synthroid  to PO   Dominique Mooney, Pharm.D Use secure chat for questions 04/11/2024 11:11 AM

## 2024-04-11 NOTE — Progress Notes (Signed)
 10 Days Post-Op   Subjective/Chief Complaint: Feels much better, had large bm this am   Objective: Vital signs in last 24 hours: Temp:  [98.5 F (36.9 C)-98.8 F (37.1 C)] 98.5 F (36.9 C) (11/23 0503) Pulse Rate:  [79-84] 82 (11/23 0503) Resp:  [12-18] 12 (11/23 0503) BP: (103-119)/(71-72) 118/72 (11/23 0503) SpO2:  [97 %-100 %] 97 % (11/23 0503) Last BM Date : 03/26/24  Intake/Output from previous day: 11/22 0701 - 11/23 0700 In: 2445 [I.V.:2445] Out: 1150 [Urine:900; Emesis/NG output:250] Intake/Output this shift: No intake/output data recorded.  Ab soft nontender incision clean  Lab Results:  Recent Labs    04/10/24 0328 04/11/24 0300  WBC 12.4* 13.2*  HGB 9.2* 8.8*  HCT 27.8* 26.5*  PLT 343 329   BMET Recent Labs    04/10/24 0328 04/11/24 0300  NA 134* 134*  K 4.6 4.2  CL 103 103  CO2 23 25  GLUCOSE 106* 117*  BUN 21 17  CREATININE 0.70 0.65  CALCIUM  8.2* 8.4*   PT/INR No results for input(s): LABPROT, INR in the last 72 hours. ABG No results for input(s): PHART, HCO3 in the last 72 hours.  Invalid input(s): PCO2, PO2  Studies/Results: No results found.  Anti-infectives: Anti-infectives (From admission, onward)    Start     Dose/Rate Route Frequency Ordered Stop   04/01/24 0800  cefTRIAXone (ROCEPHIN) 2 g in sodium chloride  0.9 % 100 mL IVPB  Status:  Discontinued        2 g 200 mL/hr over 30 Minutes Intravenous On call to O.R. 04/01/24 0700 04/01/24 0731   04/01/24 0730  cefoTEtan  (CEFOTAN ) 2 g in sodium chloride  0.9 % 100 mL IVPB        2 g 200 mL/hr over 30 Minutes Intravenous On call to O.R. 04/01/24 0640 04/01/24 1924   03/31/24 1300  cefoTEtan  (CEFOTAN ) 2 g in sodium chloride  0.9 % 100 mL IVPB  Status:  Discontinued        2 g 200 mL/hr over 30 Minutes Intravenous On call to O.R. 03/31/24 0909 03/31/24 1601   03/31/24 0945  cefTRIAXone (ROCEPHIN) 2 g in sodium chloride  0.9 % 100 mL IVPB  Status:  Discontinued        2  g 200 mL/hr over 30 Minutes Intravenous On call to O.R. 03/31/24 0854 03/31/24 0909       Assessment/Plan: POD 10 s/p ex lap with LOA and SBR for SBO, Dr. Teresa 11/13 -has postop CT that is not concerning -having bowel function, will dc ng today, keep on ice chips - TPN -mobilize, pulm toilet -multi-modal pain control   FEN - NPO/IVFs/PICC/TNA VTE - Lovenox  ID - rocephin on call to OR, no further needed, wbc stable   Dominique Mooney 04/11/2024

## 2024-04-12 DIAGNOSIS — K56609 Unspecified intestinal obstruction, unspecified as to partial versus complete obstruction: Secondary | ICD-10-CM | POA: Diagnosis not present

## 2024-04-12 LAB — CBC
HCT: 26.5 % — ABNORMAL LOW (ref 36.0–46.0)
Hemoglobin: 8.7 g/dL — ABNORMAL LOW (ref 12.0–15.0)
MCH: 30.6 pg (ref 26.0–34.0)
MCHC: 32.8 g/dL (ref 30.0–36.0)
MCV: 93.3 fL (ref 80.0–100.0)
Platelets: 356 K/uL (ref 150–400)
RBC: 2.84 MIL/uL — ABNORMAL LOW (ref 3.87–5.11)
RDW: 12.5 % (ref 11.5–15.5)
WBC: 9.9 K/uL (ref 4.0–10.5)
nRBC: 0 % (ref 0.0–0.2)

## 2024-04-12 LAB — COMPREHENSIVE METABOLIC PANEL WITH GFR
ALT: 25 U/L (ref 0–44)
AST: 59 U/L — ABNORMAL HIGH (ref 15–41)
Albumin: 3 g/dL — ABNORMAL LOW (ref 3.5–5.0)
Alkaline Phosphatase: 70 U/L (ref 38–126)
Anion gap: 8 (ref 5–15)
BUN: 16 mg/dL (ref 8–23)
CO2: 26 mmol/L (ref 22–32)
Calcium: 8.5 mg/dL — ABNORMAL LOW (ref 8.9–10.3)
Chloride: 105 mmol/L (ref 98–111)
Creatinine, Ser: 0.68 mg/dL (ref 0.44–1.00)
GFR, Estimated: >60 mL/min (ref >60)
Glucose, Bld: 111 mg/dL — ABNORMAL HIGH (ref 70–99)
Potassium: 4.2 mmol/L (ref 3.5–5.1)
Sodium: 138 mmol/L (ref 135–145)
Total Bilirubin: 0.4 mg/dL (ref 0.0–1.2)
Total Protein: 6.9 g/dL (ref 6.5–8.1)

## 2024-04-12 LAB — PHOSPHORUS: Phosphorus: 3.3 mg/dL (ref 2.5–4.6)

## 2024-04-12 LAB — MAGNESIUM: Magnesium: 2.1 mg/dL (ref 1.7–2.4)

## 2024-04-12 LAB — TRIGLYCERIDES: Triglycerides: 60 mg/dL (ref <150)

## 2024-04-12 MED ORDER — TRAVASOL 10 % IV SOLN
INTRAVENOUS | Status: AC
  Filled 2024-04-12: qty 705.6

## 2024-04-12 NOTE — TOC Progression Note (Addendum)
 Transition of Care [facility]) - Progression Note    Patient Details  Name: AKASIA AHMAD MRN: [id number] Date of Birth: 1951-04-14  [facility]) CM/SW Contact  NORMAN ASPEN, LCSW Phone Number: 04/12/2024, 10:56 AM  Clinical Narrative:     ADDENDUM: Received call from [facility] requesting progress notes with plans for their RN to complete assessment with pt tomorrow morning at 10am.  Spoke with pt's daughter who is agreeable with this CSW send documentation to facility.  Received call today from patient's daughter, [name], who expresses concern that CSW coverage yesterday had made referral for [facility] follow up.  She states, my mother lives alone! She can't go home.  Reviewed with daughter that she and I have spoken about these concerns before and that pt's current mobility level does not support SNF rehab (I.e. amb 400' supervision with most recent PT note).  Daughter had reported during our first discussion that family had been working with [facility] to try and secure an ALF for her and noted that ALF is the most appropriate level of care.  Again, encouraged her to try and make as much progress on securing ALF while her mother is here.     Barriers to Discharge: Continued Medical Work up               Expected Discharge Plan and Services                                               Social Drivers of Health (SDOH) Interventions SDOH Screenings   Food Insecurity: No Food Insecurity (03/27/2024)  Housing: High Risk (03/27/2024)  Transportation Needs: No Transportation Needs (03/27/2024)  Utilities: Not At Risk (03/27/2024)  Social Connections: Unknown (03/27/2024)  Tobacco Use: Low Risk  (04/01/2024)    Readmission Risk Interventions    03/30/2024    4:16 PM  Readmission Risk Prevention Plan  Post Dischage Appt Complete  Medication Screening Complete  Transportation Screening Complete

## 2024-04-12 NOTE — Progress Notes (Signed)
 [facility]  PROCEDURAL EXPEDITER PROGRESS NOTE  Patient Name: Dominique Mooney  DOB:20-Dec-1950 Date of Admission: 03/27/2024  Date of Assessment:04/12/24   ------------------------------------------------------------------------------------------------------------------- Pt lives along and daughter is trying to get her into a ALF, but hasn't found one yet.  Doesn't qualify for SNF -------------------------------------------------------------------------------------------------------------------  [facility] Expediter, Ronal DELENA Bald Please contact us  directly via secure chat (search for [facility]) or by calling us  at [phone] [facility]).

## 2024-04-12 NOTE — Progress Notes (Signed)
 Progress Note  11 Days Post-Op  Subjective: Patient daughter is present during encounter.  Pt denies pain. Has had no n/v. Had 2 Bms yesterday. No BM since yesterday and no definitive flatulence.    ROS  All negative with the exception of above.  Objective: Vital signs in last 24 hours: Temp:  [98.5 F (36.9 C)-99 F (37.2 C)] 98.6 F (37 C) (11/24 0528) Pulse Rate:  [74-78] 74 (11/24 0528) Resp:  [14-16] 14 (11/24 0528) BP: (109-118)/(65-73) 109/65 (11/24 0528) SpO2:  [99 %-100 %] 99 % (11/24 0528) Weight:  [53.3 kg] 53.3 kg (11/24 0500) Last BM Date : 04/11/24  Intake/Output from previous day: 11/23 0701 - 11/24 0700 In: 912.9 [P.O.:30; I.V.:882.9] Out: 1300 [Urine:900; Blood:400] Intake/Output this shift: No intake/output data recorded.  PE: General: Pleasant female who is sitting in chair at bedside in NAD. HEENT: Head is normocephalic, atraumatic.  Heart: HR normal during encounter.  Lungs: Respiratory effort nonlabored. Abd: Soft, NT, ND. Midline incision with staples present without concerns of infection. No rebound tenderness or guarding.  MS: Able to move all 4 extremities.  Skin: Warm and dry.   Lab Results:  Recent Labs    04/11/24 0300 04/12/24 0245  WBC 13.2* 9.9  HGB 8.8* 8.7*  HCT 26.5* 26.5*  PLT 329 356   BMET Recent Labs    04/11/24 0300 04/12/24 0245  NA 134* 138  K 4.2 4.2  CL 103 105  CO2 25 26  GLUCOSE 117* 111*  BUN 17 16  CREATININE 0.65 0.68  CALCIUM  8.4* 8.5*   PT/INR No results for input(s): LABPROT, INR in the last 72 hours. CMP     Component Value Date/Time   NA 138 04/12/2024 0245   NA 142 08/09/2022 0913   K 4.2 04/12/2024 0245   CL 105 04/12/2024 0245   CO2 26 04/12/2024 0245   GLUCOSE 111 (H) 04/12/2024 0245   BUN 16 04/12/2024 0245   BUN 9 08/09/2022 0913   CREATININE 0.68 04/12/2024 0245   CALCIUM  8.5 (L) 04/12/2024 0245   PROT 6.9 04/12/2024 0245   PROT 6.6 08/09/2022 0913   ALBUMIN 3.0 (L)  04/12/2024 0245   ALBUMIN 4.0 08/09/2022 0913   AST 59 (H) 04/12/2024 0245   ALT 25 04/12/2024 0245   ALKPHOS 70 04/12/2024 0245   BILITOT 0.4 04/12/2024 0245   BILITOT 0.7 08/09/2022 0913   GFRNONAA >60 04/12/2024 0245   GFRAA >60 12/31/2019 1156   Lipase     Component Value Date/Time   LIPASE 36 03/27/2024 1833       Studies/Results: No results found.  Anti-infectives: Anti-infectives (From admission, onward)    Start     Dose/Rate Route Frequency Ordered Stop   04/01/24 0800  cefTRIAXone (ROCEPHIN) 2 g in sodium chloride  0.9 % 100 mL IVPB  Status:  Discontinued        2 g 200 mL/hr over 30 Minutes Intravenous On call to O.R. 04/01/24 0700 04/01/24 0731   04/01/24 0730  cefoTEtan  (CEFOTAN ) 2 g in sodium chloride  0.9 % 100 mL IVPB        2 g 200 mL/hr over 30 Minutes Intravenous On call to O.R. 04/01/24 0640 04/01/24 1924   03/31/24 1300  cefoTEtan  (CEFOTAN ) 2 g in sodium chloride  0.9 % 100 mL IVPB  Status:  Discontinued        2 g 200 mL/hr over 30 Minutes Intravenous On call to O.R. 03/31/24 9090 03/31/24 1601   03/31/24 0945  cefTRIAXone (ROCEPHIN) 2 g in sodium chloride  0.9 % 100 mL IVPB  Status:  Discontinued        2 g 200 mL/hr over 30 Minutes Intravenous On call to O.R. 03/31/24 0854 03/31/24 0909        Assessment/Plan POD 11: s/p ex lap with LOA and SBR for SBO, Dr. Teresa 11/13 -Has postop CT 11/20 that was not concerning. -Afebrile. -WBC 9.9 from 13.2. -HGB 8.7 from 8.8. -Had BM yesterday. No BM or definitive flatulence today yet. No n/v.  -Will discuss with attending possibly advancing diet this afternoon.  -TPN -Mobilize, pulm toilet -Multi-modal pain control   FEN - NPO except for sips with meds and ice chips/IVFs/PICC/TNA VTE - Lovenox  ID - rocephin on call to OR, no further needed.    LOS: 16 days   I reviewed specialist notes, physician notes, hospitalist notes, last 24 h vitals and pain scores, last 48 h intake and output, last 24 h  labs and trends, and last 24 h imaging results.   Marjorie Carlyon Favre, [facility] 04/12/2024, 9:23 AM Please see Amion for pager number during day hours 7:00am-4:30pm

## 2024-04-12 NOTE — Progress Notes (Signed)
 Physical Therapy Treatment Patient Details Name: Dominique Mooney MRN: [id number] DOB: June 10, 1950 Today's Date: 04/12/2024   History of Present Illness Tabbetha Kutscher is a 73 yo female, s/p ex lap, lysis of adhesions and enterorrhapy (repair of small bowel serosa) 11/13 for SBO. PMH: nonobstructive CAD, CKD-3A, memory loss,  DILI due to statin, post ERCP pancreatitis in 2000, asthma, HTN and HLD    PT Comments  Pt agreeable to working with therapy. Tolerated activity well.    If plan is discharge home, recommend the following: A little help with walking and/or transfers;A little help with bathing/dressing/bathroom;Assistance with cooking/housework;Help with stairs or ramp for entrance;Assist for transportation   Can travel by private vehicle        Equipment Recommendations  None recommended by PT    Recommendations for Other Services       Precautions / Restrictions Precautions Precautions: Fall Precaution/Restrictions Comments: abdominal surgery, TPN Restrictions Weight Bearing Restrictions Per Provider Order: No     Mobility  Bed Mobility               General bed mobility comments: in recliner upon arrival    Transfers Overall transfer level: Needs assistance Equipment used: Rolling walker (2 wheels) Transfers: Sit to/from Stand Sit to Stand: Supervision           General transfer comment: Supv for safety, lines    Ambulation/Gait Ambulation/Gait assistance: Supervision Gait Distance (Feet): 350 Feet Assistive device: Rolling walker (2 wheels) Gait Pattern/deviations: Step-through pattern       General Gait Details: pt easily distracted in hallway with gait veering towards eye gaze. tolerated distance well.   Stairs             Wheelchair Mobility     Tilt Bed    Modified Rankin (Stroke Patients Only)       Balance Overall balance assessment: Mild deficits observed, not formally tested                                           Communication Communication Communication: No apparent difficulties  Cognition Arousal: Alert Behavior During Therapy: WFL for tasks assessed/performed, Anxious (mild)   PT - Cognitive impairments: History of cognitive impairments                       PT - Cognition Comments: pt anxious about being ready for when family comes to get her to go home Following commands: Impaired Following commands impaired: Only follows one step commands consistently    Cueing Cueing Techniques: Verbal cues, Gestural cues, Visual cues, Tactile cues  Exercises      General Comments        Pertinent Vitals/Pain Pain Assessment Pain Assessment: Faces Faces Pain Scale: No hurt    Home Living                          Prior Function            PT Goals (current goals can now be found in the care plan section) Progress towards PT goals: Progressing toward goals    Frequency    Min 3X/week      PT Plan      Co-evaluation              AM-PAC PT 6 Clicks Mobility   Outcome Measure  Help needed turning from your back to your side while in a flat bed without using bedrails?: A Little Help needed moving from lying on your back to sitting on the side of a flat bed without using bedrails?: A Little Help needed moving to and from a bed to a chair (including a wheelchair)?: A Little Help needed standing up from a chair using your arms (e.g., wheelchair or bedside chair)?: A Little Help needed to walk in hospital room?: A Little Help needed climbing 3-5 steps with a railing? : A Little 6 Click Score: 18    End of Session   Activity Tolerance: Patient tolerated treatment well Patient left: in chair;with call bell/phone within reach;with chair alarm set   PT Visit Diagnosis: Other abnormalities of gait and mobility (R26.89)     Time: 1431-1445 PT Time Calculation (min) (ACUTE ONLY): 14 min  Charges:    $Gait Training: 8-22 mins PT General  Charges $$ ACUTE PT VISIT: 1 Visit                        Dannial SQUIBB, PT Acute Rehabilitation  Office: [phone]

## 2024-04-12 NOTE — Progress Notes (Signed)
 PROGRESS NOTE  Dominique Mooney FMW:[id number] DOB: Nov 23, 1950   PCP: Emerick Avelina POUR, PA-C  Patient is from: Home.  Lives alone.  Family checks on her every night.  DOA: 03/27/2024 LOS: 16  Chief complaints Chief Complaint  Patient presents with   Abdominal Pain     Brief Narrative / Interim history: Patient is a 73 year old female with past medical history significant for nonobstructive CAD, CKD-3A, cognitive impairment, post ERCP pancreatitis in 2000, asthma, HTN and HLD.  Patient was admitted with small bowel obstruction.  CT abdomen and pelvis showed SBO with 2 adjacent transition points in RLQ and fecalization of the bowel between the transition point concerning for internal hernia or closed-loop obstruction.  Patient failed conservative management, underwent ex lap with lysis of adhesion on 04/01/2024 by Dr. Pizza.  Patient has had prolonged ileus postoperatively, and was started TPN on 04/06/2024.  Repeat CT abdomen and pelvis done on 04/08/24 suggested possible ileus or partial SBO, fluid anterior to rectum and in LLQ, with some leukocytosis.  Leukocytosis has resolved.  Has had bowel movement.  Surgery team is directing care.   Subjective: No complaints.   Assessment and plan: Small bowel obstruction/prolonged postop ileus:  - Above documentation. -Management as per surgery team. -Patient has had bowel movement. -Leukocytosis has resolved. -Discharge patient once cleared for discharge by the surgical team.  Memory impairment/cognitive impairment:  - No behavioral problems.   -Seems to be at baseline.   -MRI brain without acute intracranial abnormality. - Reorientation and delirium precaution   Hypothyroidism: -TSH was 41.7 on 7/22 secondary to medication non-adherence, improved to 7.73 on recheck 9/23.  - Continue IV Synthroid  50 mcg daily.  Hyperlipidemia: -Not on statin due to history of drug-induced liver injury.   Nonobstructive CAD:  -Stable.  Not on statin  due to history of drug-induced liver injury.   Essential hypertension:  -Not on meds at home.  Normotensive.  Mild intermittent asthma:  -Stable -Bronchodilators as needed  Hypokalemia/hypophosphatemia - Resolved.   - Potassium of 4.2 today.   - Phosphorus of 3.3.    Hyponatremia:  - Resolved. - Sodium of 138 today.   CKD-3A versus acute kidney injury:  -AKI has resolved. -Continue to monitor renal function and electrolytes.  Leukocytosis:  - Afebrile. - Nontoxic looking. - Resolved. - WBC of 9.9 today.  Severe malnutrition Body mass index is 22.2 kg/m. Nutrition Problem: Severe Malnutrition Etiology: acute illness (SBO) Signs/Symptoms: severe muscle depletion, moderate fat depletion, energy intake < 75% for > 7 days Interventions: TPN   DVT prophylaxis:  enoxaparin  (LOVENOX ) injection 40 mg Start: 04/06/24 1200 SCDs Start: 03/27/24 2136  Code Status: Full code Family Communication: Daughter by bedside.. Level of care: Med-Surg Status is: Inpatient Remains inpatient appropriate because: SBO/postop ileus   Final disposition: Home   Consultants:  General Surgery  Procedures: 11/13-ex lap and lysis of adhesion by Dr. Pizza  Microbiology summarized: None  Objective: Vitals:   04/11/24 2010 04/12/24 0500 04/12/24 0528 04/12/24 1346  BP: 118/73  109/65 100/70  Pulse: 75  74 72  Resp: 16  14 18   Temp: 98.5 F (36.9 C)  98.6 F (37 C) 98.5 F (36.9 C)  TempSrc: Oral  Oral Oral  SpO2: 100%  99% 100%  Weight:  53.3 kg    Height:        Examination:  GENERAL: Not in any distress.  Awake and alert. HEENT: Patient is pale. NECK: Supple.    RESP: Clear to auscultation. CVS:  S1-S2. ABD/GI/GU: Abdomen is soft and nontender. MSK/EXT: No leg edema.   NEURO: Awake and alert.  Sch Meds:  Scheduled Meds:  Chlorhexidine  Gluconate Cloth  6 each Topical Daily   enoxaparin  (LOVENOX ) injection  40 mg Subcutaneous Q24H   levothyroxine   50 mcg Intravenous  Daily   melatonin  3 mg Oral QHS   Continuous Infusions:  TPN ADULT (ION) 70 mL/hr at 04/12/24 0600   TPN ADULT (ION)     PRN Meds:.albuterol , hydrALAZINE , HYDROmorphone  (DILAUDID ) injection, menthol , methocarbamol  (ROBAXIN ) injection, ondansetron  (ZOFRAN ) IV, phenol, sodium chloride  flush  Antimicrobials: Anti-infectives (From admission, onward)    Start     Dose/Rate Route Frequency Ordered Stop   04/01/24 0800  cefTRIAXone (ROCEPHIN) 2 g in sodium chloride  0.9 % 100 mL IVPB  Status:  Discontinued        2 g 200 mL/hr over 30 Minutes Intravenous On call to O.R. 04/01/24 0700 04/01/24 0731   04/01/24 0730  cefoTEtan  (CEFOTAN ) 2 g in sodium chloride  0.9 % 100 mL IVPB        2 g 200 mL/hr over 30 Minutes Intravenous On call to O.R. 04/01/24 0640 04/01/24 1924   03/31/24 1300  cefoTEtan  (CEFOTAN ) 2 g in sodium chloride  0.9 % 100 mL IVPB  Status:  Discontinued        2 g 200 mL/hr over 30 Minutes Intravenous On call to O.R. 03/31/24 0909 03/31/24 1601   03/31/24 0945  cefTRIAXone (ROCEPHIN) 2 g in sodium chloride  0.9 % 100 mL IVPB  Status:  Discontinued        2 g 200 mL/hr over 30 Minutes Intravenous On call to O.R. 03/31/24 0854 03/31/24 0909        I have personally reviewed the following labs and images: CBC: Recent Labs  Lab 04/06/24 0315 04/09/24 0010 04/10/24 0328 04/11/24 0300 04/12/24 0245  WBC 11.9* 17.0* 12.4* 13.2* 9.9  HGB 10.4* 11.1* 9.2* 8.8* 8.7*  HCT 30.8* 32.7* 27.8* 26.5* 26.5*  MCV 92.5 91.1 92.7 93.3 93.3  PLT 301 349 343 329 356   BMP &GFR Recent Labs  Lab 04/08/24 0135 04/09/24 0010 04/10/24 0328 04/11/24 0300 04/12/24 0245  NA 130* 130* 134* 134* 138  K 4.6 4.7 4.6 4.2 4.2  CL 95* 96* 103 103 105  CO2 27 27 23 25 26   GLUCOSE 132* 124* 106* 117* 111*  BUN 11 15 21 17 16   CREATININE 0.58 0.67 0.70 0.65 0.68  CALCIUM  8.8* 8.5* 8.2* 8.4* 8.5*  MG 2.3 2.4 2.6* 2.0 2.1  PHOS 3.2 3.6 3.3 3.2 3.3   Estimated Creatinine Clearance: 48.7  mL/min (by C-G formula based on SCr of 0.68 mg/dL). Liver & Pancreas: Recent Labs  Lab 04/08/24 0135 04/12/24 0245  AST 30 59*  ALT <5 25  ALKPHOS 85 70  BILITOT 0.5 0.4  PROT 7.1 6.9  ALBUMIN 3.2* 3.0*   No results for input(s): LIPASE, AMYLASE in the last 168 hours.  No results for input(s): AMMONIA in the last 168 hours. Diabetic: No results for input(s): HGBA1C in the last 72 hours. Recent Labs  Lab 04/09/24 1113 04/09/24 1705 04/09/24 2357 04/10/24 0511 04/10/24 0533  GLUCAP 126* 105* 122* 118* 114*   Cardiac Enzymes: No results for input(s): CKTOTAL, CKMB, CKMBINDEX, TROPONINI in the last 168 hours. No results for input(s): PROBNP in the last 8760 hours. Coagulation Profile: No results for input(s): INR, PROTIME in the last 168 hours. Thyroid  Function Tests: No results for input(s): TSH, T4TOTAL,  FREET4, T3FREE, THYROIDAB in the last 72 hours. Lipid Profile: Recent Labs    04/12/24 0245  TRIG 60   Anemia Panel: No results for input(s): VITAMINB12, FOLATE, FERRITIN, TIBC, IRON, RETICCTPCT in the last 72 hours. Urine analysis:    Component Value Date/Time   COLORURINE STRAW (A) 03/27/2024 2012   APPEARANCEUR CLEAR 03/27/2024 2012   LABSPEC 1.013 03/27/2024 2012   PHURINE 7.0 03/27/2024 2012   GLUCOSEU NEGATIVE 03/27/2024 2012   GLUCOSEU NEGATIVE 07/23/2010 1623   HGBUR NEGATIVE 03/27/2024 2012   BILIRUBINUR NEGATIVE 03/27/2024 2012   KETONESUR NEGATIVE 03/27/2024 2012   PROTEINUR NEGATIVE 03/27/2024 2012   UROBILINOGEN 0.2 07/23/2010 1623   NITRITE NEGATIVE 03/27/2024 2012   LEUKOCYTESUR NEGATIVE 03/27/2024 2012   Sepsis Labs: Invalid input(s): PROCALCITONIN, LACTICIDVEN  Microbiology: No results found for this or any previous visit (from the past 240 hours).  Radiology Studies: No results found.  Time spent: 35 minutes  Leatrice chapel, MD [facility]  If 7PM-7AM, please contact  night-coverage [email or web address] 04/12/2024, 5:00 PM

## 2024-04-12 NOTE — Progress Notes (Signed)
 PHARMACY - TOTAL PARENTERAL NUTRITION CONSULT NOTE   Indication: Prolonged ileus  Patient Measurements: Height: 5' 1 (154.9 cm) Weight: 53.3 kg (117 lb 8.1 oz) IBW/kg (Calculated) : 47.8 TPN AdjBW (KG): 52.2 Body mass index is 22.2 kg/m. Usual Weight:   Assessment: Patient is a 73 y.o F who presented to the ED on 03/27/24 with c/o abdominal pain and n/v. Abdominal CT on 03/27/24 showed SBO.   She subsequently underwent exp lap with small bowel resection, lysis of adhesions and enterorrhaphy on 04/01/24.  Pharmacy has been consulted on 04/05/24 to start TPN for prolonged ileus.  Glucose / Insulin : no hx DM.  -SSI & CBGs stopped on 11/22 Electrolytes: WNL including CorrCa 9.3 -Goal for ileus: Mag > 2 and K > 4 Renal: Scr <1, BUN WNL Hepatic: AST minimally elevated, albumin 3 Intake / Output; MIVF:   -UOP ~ 500 ml documented 11/23 -LBM 11/23 x 2 GI Imaging: -11/18 Abd xray: Reduced small bowel dilatation  -11/20 CT: small bowel dilatation, possibly due to postoperative ileus or partial obstruction at the level of the anastomosis. GI Surgeries / Procedures:  -11/13: exp lap with small bowel resection, lysis of adhesions and enterorrhaphy.   Central access: PICC TPN start date: 04/05/24   Nutritional Goals: Goal TPN rate is 70 mL/hr (provides 70 g of protein and 1632 kcals per day)  RD Assessment: Estimated Needs Total Energy Estimated Needs: 1550-1750 Total Protein Estimated Needs: 65-80g Total Fluid Estimated Needs: 1.7L/day  Current Nutrition: TPN, remains NPO except for meds/ice chips  Plan:  -Continue TPN at goal rate 70 ml/hr -Electrolytes in TPN: Na 100 mEq/L, K 30 mEq/L, Ca 5 mEq/L, Mg 2 mEq/L, Phos 20 mmol/L. Cl:Ac 1:1 -Continue standard MVI and trace elements in TPN -Monitor TPN labs on Mon/Thurs and PRN -Plan to possibly advance diet this afternoon   Stefano MARLA Bologna, PharmD, BCPS Clinical Pharmacist 04/12/2024 9:51 AM

## 2024-04-13 DIAGNOSIS — K56609 Unspecified intestinal obstruction, unspecified as to partial versus complete obstruction: Secondary | ICD-10-CM | POA: Diagnosis not present

## 2024-04-13 LAB — CBC
HCT: 26.2 % — ABNORMAL LOW (ref 36.0–46.0)
Hemoglobin: 8.6 g/dL — ABNORMAL LOW (ref 12.0–15.0)
MCH: 30.8 pg (ref 26.0–34.0)
MCHC: 32.8 g/dL (ref 30.0–36.0)
MCV: 93.9 fL (ref 80.0–100.0)
Platelets: 372 K/uL (ref 150–400)
RBC: 2.79 MIL/uL — ABNORMAL LOW (ref 3.87–5.11)
RDW: 12.5 % (ref 11.5–15.5)
WBC: 8.7 K/uL (ref 4.0–10.5)
nRBC: 0 % (ref 0.0–0.2)

## 2024-04-13 MED ORDER — TRAVASOL 10 % IV SOLN
INTRAVENOUS | Status: AC
  Filled 2024-04-13: qty 705.6

## 2024-04-13 MED ORDER — LEVOTHYROXINE SODIUM 75 MCG PO TABS
75.0000 ug | ORAL_TABLET | Freq: Every day | ORAL | Status: DC
  Administered 2024-04-14 – 2024-04-20 (×7): 75 ug via ORAL
  Filled 2024-04-13 (×7): qty 1

## 2024-04-13 MED ORDER — TUBERCULIN PPD 5 UNIT/0.1ML ID SOLN
5.0000 [IU] | Freq: Once | INTRADERMAL | Status: AC
  Administered 2024-04-13: 5 [IU] via INTRADERMAL
  Filled 2024-04-13: qty 0.1

## 2024-04-13 MED ORDER — BOOST / RESOURCE BREEZE PO LIQD CUSTOM
1.0000 | Freq: Three times a day (TID) | ORAL | Status: DC
  Administered 2024-04-13 – 2024-04-18 (×12): 1 via ORAL
  Administered 2024-04-19: 237 mL via ORAL

## 2024-04-13 NOTE — Plan of Care (Signed)
   Problem: Nutrition: Goal: Adequate nutrition will be maintained Outcome: Progressing   Problem: Coping: Goal: Level of anxiety will decrease Outcome: Progressing

## 2024-04-13 NOTE — Progress Notes (Signed)
 Progress Note  12 Days Post-Op  Subjective: No family at bedside during encounter.  Patient reports tolerating CLD. Reports that she had BM and flatulence (BM has been recorded this morning). Denies nausea and vomiting. Denies pain.   ROS  All negative with the exception of above.  Objective: Vital signs in last 24 hours: Temp:  [98.5 F (36.9 C)-98.9 F (37.2 C)] 98.5 F (36.9 C) (11/25 0412) Pulse Rate:  [69-74] 69 (11/25 0412) Resp:  [18] 18 (11/25 0412) BP: (100-113)/(67-77) 111/77 (11/25 0412) SpO2:  [100 %] 100 % (11/25 0412) Last BM Date : 04/13/24  Intake/Output from previous day: 11/24 0701 - 11/25 0700 In: 1687.9 [P.O.:180; I.V.:1507.9] Out: 1400 [Urine:1400] Intake/Output this shift: No intake/output data recorded.  PE: General: Pleasant female who is sitting in chair at bedside in NAD. HEENT: Head is normocephalic, atraumatic.  Heart: HR normal during encounter.  Lungs: Respiratory effort nonlabored. Abd: Soft, NT, ND. Midline incision with staples present without concerns of infection. No rebound tenderness or guarding.  MS: Able to move all 4 extremities.  Skin: Warm and dry.   Lab Results:  Recent Labs    04/12/24 0245 04/13/24 0317  WBC 9.9 8.7  HGB 8.7* 8.6*  HCT 26.5* 26.2*  PLT 356 372   BMET Recent Labs    04/11/24 0300 04/12/24 0245  NA 134* 138  K 4.2 4.2  CL 103 105  CO2 25 26  GLUCOSE 117* 111*  BUN 17 16  CREATININE 0.65 0.68  CALCIUM  8.4* 8.5*   PT/INR No results for input(s): LABPROT, INR in the last 72 hours. CMP     Component Value Date/Time   NA 138 04/12/2024 0245   NA 142 08/09/2022 0913   K 4.2 04/12/2024 0245   CL 105 04/12/2024 0245   CO2 26 04/12/2024 0245   GLUCOSE 111 (H) 04/12/2024 0245   BUN 16 04/12/2024 0245   BUN 9 08/09/2022 0913   CREATININE 0.68 04/12/2024 0245   CALCIUM  8.5 (L) 04/12/2024 0245   PROT 6.9 04/12/2024 0245   PROT 6.6 08/09/2022 0913   ALBUMIN 3.0 (L) 04/12/2024 0245    ALBUMIN 4.0 08/09/2022 0913   AST 59 (H) 04/12/2024 0245   ALT 25 04/12/2024 0245   ALKPHOS 70 04/12/2024 0245   BILITOT 0.4 04/12/2024 0245   BILITOT 0.7 08/09/2022 0913   GFRNONAA >60 04/12/2024 0245   GFRAA >60 12/31/2019 1156   Lipase     Component Value Date/Time   LIPASE 36 03/27/2024 1833       Studies/Results: No results found.  Anti-infectives: Anti-infectives (From admission, onward)    Start     Dose/Rate Route Frequency Ordered Stop   04/01/24 0800  cefTRIAXone (ROCEPHIN) 2 g in sodium chloride  0.9 % 100 mL IVPB  Status:  Discontinued        2 g 200 mL/hr over 30 Minutes Intravenous On call to O.R. 04/01/24 0700 04/01/24 0731   04/01/24 0730  cefoTEtan  (CEFOTAN ) 2 g in sodium chloride  0.9 % 100 mL IVPB        2 g 200 mL/hr over 30 Minutes Intravenous On call to O.R. 04/01/24 0640 04/01/24 1924   03/31/24 1300  cefoTEtan  (CEFOTAN ) 2 g in sodium chloride  0.9 % 100 mL IVPB  Status:  Discontinued        2 g 200 mL/hr over 30 Minutes Intravenous On call to O.R. 03/31/24 0909 03/31/24 1601   03/31/24 0945  cefTRIAXone (ROCEPHIN) 2 g in sodium  chloride 0.9 % 100 mL IVPB  Status:  Discontinued        2 g 200 mL/hr over 30 Minutes Intravenous On call to O.R. 03/31/24 0854 03/31/24 0909        Assessment/Plan POD 12: s/p ex lap with LOA and SBR for SBO, Dr. Teresa 11/13 -Had postop CT 11/20 that was not concerning. -Afebrile. -WBC 8.7 -HGB 8.6 from 8.7. -Had BM and flatulence today. Tolerating CLD. No n/v or pain.  -Will advance to FLD. -Will consider decreasing TPN. -Mobilize, pulm toilet -Multi-modal pain control   FEN - Will advance to FLD and consider decreasing TPN/IVFs per primary team VTE - Lovenox  ID - rocephin on call to OR, no further needed.    LOS: 17 days   I reviewed hospitalist notes, specialist notes, nursing notes, last 24 h vitals and pain scores, last 48 h intake and output, last 24 h labs and trends, and last 24 h imaging  results.   Dominique Mooney, [facility] Surgery 04/13/2024, 9:34 AM Please see Amion for pager number during day hours 7:00am-4:30pm

## 2024-04-13 NOTE — Progress Notes (Signed)
 PHARMACY - TOTAL PARENTERAL NUTRITION CONSULT NOTE   Indication: Prolonged ileus  Patient Measurements: Height: 5' 1 (154.9 cm) Weight: 53.3 kg (117 lb 8.1 oz) IBW/kg (Calculated) : 47.8 TPN AdjBW (KG): 52.2 Body mass index is 22.2 kg/m.  Assessment: Patient is a 73 y.o F who presented to the ED on 03/27/24 with c/o abdominal pain and n/v. Abdominal CT on 03/27/24 showed SBO.   She subsequently underwent exp lap with small bowel resection, lysis of adhesions and enterorrhaphy on 04/01/24.  Pharmacy has been consulted on 04/05/24 to start TPN for prolonged ileus.  Glucose / Insulin : no hx DM.  -SSI & CBGs stopped on 11/22 Electrolytes: WNL including CorrCa 9.3 -Goal for ileus: Mag > 2 and K > 4 Renal: Scr <1, BUN WNL Hepatic: AST minimally elevated, albumin 3 Intake / Output; MIVF:   -Minimal PO documented -UOP decent -LBM 11/25 GI Imaging: -11/18 Abd xray: Reduced small bowel dilatation  -11/20 CT: small bowel dilatation, possibly due to postoperative ileus or partial obstruction at the level of the anastomosis. GI Surgeries / Procedures:  -11/13: exp lap with small bowel resection, lysis of adhesions and enterorrhaphy.   Central access: PICC TPN start date: 04/05/24   Nutritional Goals: Goal TPN rate is 70 mL/hr (provides 70 g of protein and 1632 kcals per day)  RD Assessment: Estimated Needs Total Energy Estimated Needs: 1550-1750 Total Protein Estimated Needs: 65-80g Total Fluid Estimated Needs: 1.7L/day  Current Nutrition:  TPN CLD started 11/24 - pt reports tolerating per note however no intake documented during the day, ice chips documented overnight Advance to FLD 11/25  Plan:  -Continue TPN at goal rate 70 ml/hr for now per discussion with surgery PA -Electrolytes in TPN: Na 100 mEq/L, K 30 mEq/L, Ca 5 mEq/L, Mg 2 mEq/L, Phos 20 mmol/L. Cl:Ac 1:1 -Continue standard MVI and trace elements in TPN -Monitor TPN labs on Mon/Thurs and PRN -Further discussions with  surgeon regarding ability to decrease TPN   Stefano MARLA Bologna, PharmD, BCPS Clinical Pharmacist 04/13/2024 10:21 AM

## 2024-04-13 NOTE — Discharge Instructions (Signed)
 [facility]      [facility], [place] [phone]  OPEN ABDOMINAL SURGERY: POST OP INSTRUCTIONS  Always review your discharge instruction sheet given to you by the facility where your surgery was performed.  IF YOU HAVE DISABILITY OR FAMILY LEAVE FORMS, YOU MUST BRING THEM TO THE OFFICE FOR PROCESSING.  PLEASE DO NOT GIVE THEM TO YOUR DOCTOR.  A prescription for pain medication may be given to you upon discharge.  Take your pain medication as prescribed, if needed.  If narcotic pain medicine is not needed, then you may take acetaminophen  (Tylenol ) or ibuprofen (Advil) as needed. Take your usually prescribed medications unless otherwise directed. If you need a refill on your pain medication, please contact your pharmacy. They will contact our office to request authorization.  Prescriptions will not be filled after 5pm or on week-ends. You should follow a light diet the first few days after arrival home, such as soup and crackers, pudding, etc.unless your doctor has advised otherwise. A high-fiber, low fat diet can be resumed as tolerated.   Be sure to include lots of fluids daily. Most patients will experience some swelling and bruising on the chest and neck area.  Ice packs will help.  Swelling and bruising can take several days to resolve Most patients will experience some swelling and bruising in the area of the incision. Ice pack will help. Swelling and bruising can take several days to resolve..  It is common to experience some constipation if taking pain medication after surgery.  Increasing fluid intake and taking a stool softener will usually help or prevent this problem from occurring.  A mild laxative (Milk of Magnesia or Miralax) should be taken according to package directions if there are no bowel movements after 48 hours.  You may have steri-strips (small skin tapes) in place directly over the incision.  These strips should be left on the skin for 7-10 days.  If your surgeon used skin  glue on the incision, you may shower in 24 hours.  The glue will flake off over the next 2-3 weeks.  Any sutures or staples will be removed at the office during your follow-up visit. You may find that a light gauze bandage over your incision may keep your staples from being rubbed or pulled. You may shower and replace the bandage daily. ACTIVITIES:  You may resume regular (light) daily activities beginning the next day--such as daily self-care, walking, climbing stairs--gradually increasing activities as tolerated.  You may have sexual intercourse when it is comfortable.  Refrain from any heavy lifting or straining until approved by your doctor. You may drive when you no longer are taking prescription pain medication, you can comfortably wear a seatbelt, and you can safely maneuver your car and apply brakes Return to Work: ___________________________________ Dominique Mooney should see your doctor in the office for a follow-up appointment approximately two weeks after your surgery.  Make sure that you call for this appointment within a day or two after you arrive home to insure a convenient appointment time. OTHER INSTRUCTIONS:  _____________________________________________________________ _____________________________________________________________  WHEN TO CALL YOUR DOCTOR: Fever over 101.0 Inability to urinate Nausea and/or vomiting Extreme swelling or bruising Continued bleeding from incision. Increased pain, redness, or drainage from the incision. Difficulty swallowing or breathing Muscle cramping or spasms. Numbness or tingling in hands or feet or around lips.  The clinic staff is available to answer your questions during regular business hours.  Please don't hesitate to call and ask to speak to one of  the nurses if you have concerns.  For further questions, please visit [email or web address]

## 2024-04-13 NOTE — Progress Notes (Signed)
 Nutrition Follow-up  DOCUMENTATION CODES:   Severe malnutrition in context of acute illness/injury  INTERVENTION:    -TPN management per Pharmacy -Daily weights while on TPN  -Boost Breeze po TID, each supplement provides 250 kcal and 9 grams of protein   NUTRITION DIAGNOSIS:   Severe Malnutrition related to acute illness (SBO) as evidenced by severe muscle depletion, moderate fat depletion, energy intake < 75% for > 7 days.  Ongoing.  GOAL:   Patient will meet greater than or equal to 90% of their needs  Meeting with TPN  MONITOR:   Diet advancement (TPN)   ASSESSMENT:   73 year old F with PMH of nonobstructive CAD, CKD-3A, memory loss,  DILI due to statin, post ERCP pancreatitis in 2000, asthma, HTN and HLD presented to ED with nausea, vomiting and abdominal pain, and admitted with small bowel obstruction.  CT abdomen and pelvis showed SBO with 2 adjacent transition points in RLQ and fecalization of the bowel between the transition point concerning for internal hernia or closed-loop obstruction.She underwent ex lap with LOA on 11/13 by Dr. Teresa.  11/8: admitted, NPO 11/9: NGT placed 11/13: s/p ex lap, LOA 11/17: TPN initiated 11/23: NGT removed 11/24: CLD  Patient now on full liquids. Will order Boost Breeze as prefers these PTA.  TPN continuing at 70 ml/hr, providing 1632 kcals and 70g protein.  Admission weight: 115 lbs Current weight: 117 lbs  Medications reviewed.  Labs reviewed.  Diet Order:   Diet Order             Diet full liquid Room service appropriate? Yes; Fluid consistency: Thin  Diet effective now                   EDUCATION NEEDS:   Education needs have been addressed  Skin:  Skin Assessment: Skin Integrity Issues: Skin Integrity Issues:: Incisions Incisions: 11/13 abdomen  Last BM:  11/25 -type 4  Height:   Ht Readings from Last 1 Encounters:  03/31/24 5' 1 (1.549 m)    Weight:   Wt Readings from Last 1 Encounters:   04/12/24 53.3 kg    BMI:  Body mass index is 22.2 kg/m.  Estimated Nutritional Needs:   Kcal:  1550-1750  Protein:  65-80g  Fluid:  1.7L/day   Morna Lee, MS, RD, LDN Inpatient Clinical Dietitian Contact via Secure chat

## 2024-04-13 NOTE — Progress Notes (Signed)
 Mobility Specialist Progress Note:   04/13/24 0928  Mobility  Activity Ambulated with assistance  Level of Assistance Contact guard assist, steadying assist  Assistive Device Front wheel walker  Distance Ambulated (ft) 385 ft  Activity Response Tolerated well  Mobility Referral Yes  Mobility visit 1 Mobility  Mobility Specialist Start Time (ACUTE ONLY) 0908  Mobility Specialist Stop Time (ACUTE ONLY) [id number]  Mobility Specialist Time Calculation (min) (ACUTE ONLY) 15 min   Pt was received in bed and agreed to mobility. No complaints during ambulation. Returned to bed with all needs met. Call bell in reach.  [employer] - Mobility Specialist

## 2024-04-13 NOTE — TOC Progression Note (Signed)
 Transition of Care [facility]) - Progression Note    Patient Details  Name: Dominique Mooney MRN: [id number] Date of Birth: 1951-03-04  Transition of Care [facility]) CM/SW Contact  NORMAN ASPEN, LCSW Phone Number: 04/13/2024, 2:10 PM  Clinical Narrative:     Have confirmed with pt's daughter that she is working with [facility] to have ALF bed ready for her mother to dc to from here when medically cleared.  Daughter aware that the transition off of TPN is the primary goal to be medically cleared for dc.   Have confirmed this plan with Admission coordinator at [facility] as well, Leartis Alderman @ [phone]. [phone].  She has requested TB test to be done - MD aware and this has been ordered.  Have also sent over to [facility] the orders for PT and OT. Will continue to follow and stay in touch with ALF and daughter.   Barriers to Discharge: Continued Medical Work up               Expected Discharge Plan and Services                                               Social Drivers of Health (SDOH) Interventions SDOH Screenings   Food Insecurity: No Food Insecurity (03/27/2024)  Housing: High Risk (03/27/2024)  Transportation Needs: No Transportation Needs (03/27/2024)  Utilities: Not At Risk (03/27/2024)  Social Connections: Unknown (03/27/2024)  Tobacco Use: Low Risk  (04/01/2024)    Readmission Risk Interventions    03/30/2024    4:16 PM  Readmission Risk Prevention Plan  Post Dischage Appt Complete  Medication Screening Complete  Transportation Screening Complete

## 2024-04-13 NOTE — Progress Notes (Signed)
 PROGRESS NOTE  Dominique Mooney  FMW:[id number] DOB: [birth year] DOA: 03/27/2024 PCP: Emerick Avelina POUR, PA-C   Brief Narrative: Patient is a 73 year old female with history of nonobstructive coronary artery disease, CKD stage IIIa, cognitive impairment, post ERCP pancreatitis, asthma, hypertension, hyperlipidemia who presented with abdominal pain.  CT abdomen/pelvis showed SBO with 2 adjacent transition points in the right lower quadrant and fecalization of the bowel within the transition point concerning for internal hernia or closed-loop obstruction.  Patient failed conservative management, underwent expiratory laparotomy with lysis of adhesions on 04/01/2024 by Dr. Pizza.  Had prolonged ileus postoperatively and was started on TPN on 04/06/2024.Repeat CT abdomen and pelvis done on 04/08/24 suggested possible ileus or partial SBO, fluid anterior to rectum and in LLQ, with some leukocytosis.  Leukocytosis has resolved.  Finally had a bowel movement.  Diet upgraded to full liquid diet, plan to wean TPN.  General surgery following  Assessment & Plan:  Principal Problem:   Small bowel obstruction (HCC) Active Problems:   Hypothyroidism   Hyperlipidemia   Mild persistent asthma   CAD in native artery   Chronic kidney disease, stage 3a (HCC)   Protein-calorie malnutrition, severe  Small bowel obstruction/prolonged postop ileus: Clinical scenario as above.  Patient had a bowel movement.  Leukocytosis resolved.  Currently on full liquid diet.  Plan to taper TPN.  Memory impairment/cognitive impairment: Continue delirium precaution, frequent reorientation.  MRI did not show any acute intracranial abnormalities  Hypothyroidism: History of noncompliance.  Currently on Synthyroid IV 50 mcg daily.  History of nonobstructive coronary disease: Stable.  No anginal symptoms  Hyperlipidemia: Not on statin due to drug-induced liver injury  Hypertension: Currently normotensive.  Not taking any medications at  home  Hypokalemia/hypophosphatemia: Will continue to monitor and supplement as necessary  Normocytic anemia: Currently hemoglobin stable in the range of 8-9  Mild intermittent asthma: Stable  Hyponatremia: Resolved  Debility/deconditioning: PT recommended home health on discharge    Nutrition Problem: Severe Malnutrition Etiology: acute illness (SBO)    DVT prophylaxis:enoxaparin  (LOVENOX ) injection 40 mg Start: 04/06/24 1200 SCDs Start: 03/27/24 2136     Code Status: Full Code  Family Communication: None at bedside  Patient status:Inpatient  Patient is from :Home  Anticipated discharge un:Ynfz  Estimated DC date:after clearance from general surgery   Consultants: General surgery  Procedures: Exploratory laparotomy  Antimicrobials:  Anti-infectives (From admission, onward)    Start     Dose/Rate Route Frequency Ordered Stop   04/01/24 0800  cefTRIAXone (ROCEPHIN) 2 g in sodium chloride  0.9 % 100 mL IVPB  Status:  Discontinued        2 g 200 mL/hr over 30 Minutes Intravenous On call to O.R. 04/01/24 0700 04/01/24 0731   04/01/24 0730  cefoTEtan  (CEFOTAN ) 2 g in sodium chloride  0.9 % 100 mL IVPB        2 g 200 mL/hr over 30 Minutes Intravenous On call to O.R. 04/01/24 0640 04/01/24 1924   03/31/24 1300  cefoTEtan  (CEFOTAN ) 2 g in sodium chloride  0.9 % 100 mL IVPB  Status:  Discontinued        2 g 200 mL/hr over 30 Minutes Intravenous On call to O.R. 03/31/24 0909 03/31/24 1601   03/31/24 0945  cefTRIAXone (ROCEPHIN) 2 g in sodium chloride  0.9 % 100 mL IVPB  Status:  Discontinued        2 g 200 mL/hr over 30 Minutes Intravenous On call to O.R. 03/31/24 9145 03/31/24 9090  Subjective: Patient seen and examined at bedside today.  She was sitting on the seat.  Comfortable.  No abdominal pain, nausea or vomiting today.  Had 2 bowel movements today.  Abdomen is soft and nondistended.  Diet avoided to full liquid diet.  She wants to go home soon for  holidays  Objective: Vitals:   04/12/24 0528 04/12/24 1346 04/12/24 2009 04/13/24 0412  BP: 109/65 100/70 113/67 111/77  Pulse: 74 72 74 69  Resp: 14 18 18 18   Temp: 98.6 F (37 C) 98.5 F (36.9 C) 98.9 F (37.2 C) 98.5 F (36.9 C)  TempSrc: Oral Oral Oral Oral  SpO2: 99% 100% 100% 100%  Weight:      Height:        Intake/Output Summary (Last 24 hours) at 04/13/2024 1022 Last data filed at 04/13/2024 1000 Gross per 24 hour  Intake 1807.92 ml  Output 1400 ml  Net 407.92 ml   Filed Weights   04/09/24 0500 04/10/24 0500 04/12/24 0500  Weight: 51.2 kg 53 kg 53.3 kg    Examination:  General exam: Overall comfortable, not in distress HEENT: PERRL Respiratory system:  no wheezes or crackles  Cardiovascular system: S1 & S2 heard, RRR.  Gastrointestinal system: Abdomen is nondistended, soft and nontender.  Staples on the lower abdomen from expiratory laparotomy Central nervous system: Alert and oriented Extremities: No edema, no clubbing ,no cyanosis Skin: No rashes, no ulcers,no icterus     Data Reviewed: I have personally reviewed following labs and imaging studies  CBC: Recent Labs  Lab 04/09/24 0010 04/10/24 0328 04/11/24 0300 04/12/24 0245 04/13/24 0317  WBC 17.0* 12.4* 13.2* 9.9 8.7  HGB 11.1* 9.2* 8.8* 8.7* 8.6*  HCT 32.7* 27.8* 26.5* 26.5* 26.2*  MCV 91.1 92.7 93.3 93.3 93.9  PLT 349 343 329 356 372   Basic Metabolic Panel: Recent Labs  Lab 04/08/24 0135 04/09/24 0010 04/10/24 0328 04/11/24 0300 04/12/24 0245  NA 130* 130* 134* 134* 138  K 4.6 4.7 4.6 4.2 4.2  CL 95* 96* 103 103 105  CO2 27 27 23 25 26   GLUCOSE 132* 124* 106* 117* 111*  BUN 11 15 21 17 16   CREATININE 0.58 0.67 0.70 0.65 0.68  CALCIUM  8.8* 8.5* 8.2* 8.4* 8.5*  MG 2.3 2.4 2.6* 2.0 2.1  PHOS 3.2 3.6 3.3 3.2 3.3     No results found for this or any previous visit (from the past 240 hours).   Radiology Studies: No results found.  Scheduled Meds:  Chlorhexidine  Gluconate  Cloth  6 each Topical Daily   enoxaparin  (LOVENOX ) injection  40 mg Subcutaneous Q24H   levothyroxine   50 mcg Intravenous Daily   melatonin  3 mg Oral QHS   Continuous Infusions:  TPN ADULT (ION) 70 mL/hr at 04/12/24 1723   TPN ADULT (ION)       LOS: 17 days   Ivonne Mustache, MD [facility] P11/25/2025, 10:22 AM

## 2024-04-14 DIAGNOSIS — K56609 Unspecified intestinal obstruction, unspecified as to partial versus complete obstruction: Secondary | ICD-10-CM | POA: Diagnosis not present

## 2024-04-14 LAB — CBC
HCT: 24.1 % — ABNORMAL LOW (ref 36.0–46.0)
Hemoglobin: 7.9 g/dL — ABNORMAL LOW (ref 12.0–15.0)
MCH: 31 pg (ref 26.0–34.0)
MCHC: 32.8 g/dL (ref 30.0–36.0)
MCV: 94.5 fL (ref 80.0–100.0)
Platelets: 361 K/uL (ref 150–400)
RBC: 2.55 MIL/uL — ABNORMAL LOW (ref 3.87–5.11)
RDW: 12.5 % (ref 11.5–15.5)
WBC: 7.1 K/uL (ref 4.0–10.5)
nRBC: 0 % (ref 0.0–0.2)

## 2024-04-14 MED ORDER — TRAVASOL 10 % IV SOLN
INTRAVENOUS | Status: AC
  Filled 2024-04-14: qty 352.8

## 2024-04-14 MED ORDER — ADULT MULTIVITAMIN W/MINERALS CH
1.0000 | ORAL_TABLET | Freq: Every day | ORAL | Status: DC
  Administered 2024-04-14 – 2024-04-20 (×7): 1 via ORAL
  Filled 2024-04-14 (×7): qty 1

## 2024-04-14 NOTE — Progress Notes (Signed)
 Mobility Specialist Progress Note:   04/14/24 1012  Mobility  Activity Ambulated with assistance  Level of Assistance Contact guard assist, steadying assist  Assistive Device Front wheel walker  Distance Ambulated (ft) 350 ft  Activity Response Tolerated well  Mobility Referral Yes  Mobility visit 1 Mobility  Mobility Specialist Start Time (ACUTE ONLY) 0945  Mobility Specialist Stop Time (ACUTE ONLY) 0957  Mobility Specialist Time Calculation (min) (ACUTE ONLY) 12 min   Pt was received in bed and agreed to mobility. No complaints during ambulation. 300 ft w/ RW + last 50 ft w/ HHA. Returned to bed with all needs met and call bell in reach. Left in room with RN.  [employer] - Mobility Specialist

## 2024-04-14 NOTE — Progress Notes (Signed)
 Progress Note  13 Days Post-Op  Subjective: Patient reports tolerating FLD without concerns of nausea and vomiting. Had 2 Bms yesterday. Feels that she is getting ready to move her bowel this morning. Denies pain.  ROS  All negative with the exception of above.  Objective: Vital signs in last 24 hours: Temp:  [98.1 F (36.7 C)-98.5 F (36.9 C)] 98.5 F (36.9 C) (11/26 0434) Pulse Rate:  [72-85] 72 (11/26 0434) Resp:  [16-18] 16 (11/26 0434) BP: (101-113)/(61-68) 101/61 (11/26 0434) SpO2:  [100 %] 100 % (11/26 0434) Weight:  [54.2 kg] 54.2 kg (11/26 0513) Last BM Date : 04/13/24  Intake/Output from previous day: 11/25 0701 - 11/26 0700 In: 2460.2 [P.O.:720; I.V.:1740.2] Out: -  Intake/Output this shift: Total I/O In: -  Out: 550 [Urine:550]  PE: General: Pleasant female who is in bed in NAD. HEENT: Head is normocephalic, atraumatic.  Heart: HR normal during encounter.  Lungs: Respiratory effort nonlabored. Abd: Soft, NT, ND. Midline incision with staples present. Mild erythema of superior portion of the wound (Could be from her denim garments). Continue to monitor. No rebound tenderness or guarding.  MS: Able to move all 4 extremities.  Skin: Warm and dry.     Lab Results:  Recent Labs    04/13/24 0317 04/14/24 0149  WBC 8.7 7.1  HGB 8.6* 7.9*  HCT 26.2* 24.1*  PLT 372 361   BMET Recent Labs    04/12/24 0245  NA 138  K 4.2  CL 105  CO2 26  GLUCOSE 111*  BUN 16  CREATININE 0.68  CALCIUM  8.5*   PT/INR No results for input(s): LABPROT, INR in the last 72 hours. CMP     Component Value Date/Time   NA 138 04/12/2024 0245   NA 142 08/09/2022 0913   K 4.2 04/12/2024 0245   CL 105 04/12/2024 0245   CO2 26 04/12/2024 0245   GLUCOSE 111 (H) 04/12/2024 0245   BUN 16 04/12/2024 0245   BUN 9 08/09/2022 0913   CREATININE 0.68 04/12/2024 0245   CALCIUM  8.5 (L) 04/12/2024 0245   PROT 6.9 04/12/2024 0245   PROT 6.6 08/09/2022 0913   ALBUMIN 3.0  (L) 04/12/2024 0245   ALBUMIN 4.0 08/09/2022 0913   AST 59 (H) 04/12/2024 0245   ALT 25 04/12/2024 0245   ALKPHOS 70 04/12/2024 0245   BILITOT 0.4 04/12/2024 0245   BILITOT 0.7 08/09/2022 0913   GFRNONAA >60 04/12/2024 0245   GFRAA >60 12/31/2019 1156   Lipase     Component Value Date/Time   LIPASE 36 03/27/2024 1833       Studies/Results: No results found.  Anti-infectives: Anti-infectives (From admission, onward)    Start     Dose/Rate Route Frequency Ordered Stop   04/01/24 0800  cefTRIAXone (ROCEPHIN) 2 g in sodium chloride  0.9 % 100 mL IVPB  Status:  Discontinued        2 g 200 mL/hr over 30 Minutes Intravenous On call to O.R. 04/01/24 0700 04/01/24 0731   04/01/24 0730  cefoTEtan  (CEFOTAN ) 2 g in sodium chloride  0.9 % 100 mL IVPB        2 g 200 mL/hr over 30 Minutes Intravenous On call to O.R. 04/01/24 0640 04/01/24 1924   03/31/24 1300  cefoTEtan  (CEFOTAN ) 2 g in sodium chloride  0.9 % 100 mL IVPB  Status:  Discontinued        2 g 200 mL/hr over 30 Minutes Intravenous On call to O.R. 03/31/24 9090 03/31/24 1601  03/31/24 0945  cefTRIAXone (ROCEPHIN) 2 g in sodium chloride  0.9 % 100 mL IVPB  Status:  Discontinued        2 g 200 mL/hr over 30 Minutes Intravenous On call to O.R. 03/31/24 0854 03/31/24 0909        Assessment/Plan POD 13: s/p ex lap with LOA and SBR for SBO, Dr. Teresa 11/13 -Had postop CT 11/20 that was not concerning. -Afebrile. -WBC 7.1 -HGB 7.9 from 8.6. Continue to monitor. -Having bowel function. Tolerating FLD. No n/v or pain.  -Will advance to soft diet. -Will decrease TPN to half. -Mobilize, pulm toilet -Multi-modal pain control   FEN - Will advance to soft diet and decrease TPN to half/IVFs per primary team VTE - Lovenox  ID - Rocephin on call to OR, no further needed.    LOS: 18 days   I reviewed specialist notes, hospitalist notes, nursing notes, last 24 h vitals and pain scores, last 48 h intake and output, last 24 h labs and  trends, and last 24 h imaging results.   Marjorie Carlyon Favre, [facility] 04/14/2024, 8:12 AM Please see Amion for pager number during day hours 7:00am-4:30pm

## 2024-04-14 NOTE — Plan of Care (Signed)
 ?  Problem: Clinical Measurements: ?Goal: Ability to maintain clinical measurements within normal limits will improve ?Outcome: Progressing ?Goal: Will remain free from infection ?Outcome: Progressing ?Goal: Diagnostic test results will improve ?Outcome: Progressing ?  ?

## 2024-04-14 NOTE — TOC Progression Note (Addendum)
 Transition of Care [facility]) - Progression Note    Patient Details  Name: Dominique Mooney MRN: [id number] Date of Birth: 1950-10-20  [facility] CM/SW Contact  Heather DELENA Saltness, LCSW Phone Number: 04/14/2024, 2:44 PM  Clinical Narrative:    Pt remains on TPN. Plan is to discharge to [facility]F, once weaned off TPN and medically stable. CSW spoke with pt's daughter, [name] Simpler [phone], via phone call to discuss discharge plan and provide update. Daughter reports [facility] staff will complete assessment at the hospital for memory care on Friday 11/28. TOC will continue to follow.     Barriers to Discharge: Continued Medical Work up   Expected Discharge Plan and Services  [facility] ALF      Social Drivers of Health (SDOH) Interventions SDOH Screenings   Food Insecurity: No Food Insecurity (03/27/2024)  Housing: High Risk (03/27/2024)  Transportation Needs: No Transportation Needs (03/27/2024)  Utilities: Not At Risk (03/27/2024)  Social Connections: Unknown (03/27/2024)  Tobacco Use: Low Risk  (04/01/2024)    Readmission Risk Interventions    03/30/2024    4:16 PM  Readmission Risk Prevention Plan  Post Dischage Appt Complete  Medication Screening Complete  Transportation Screening Complete    Signed: Heather Saltness, MSW, LCSW Clinical Social Worker Inpatient Care Management 04/14/2024 2:46 PM

## 2024-04-14 NOTE — Progress Notes (Signed)
 PHARMACY - TOTAL PARENTERAL NUTRITION CONSULT NOTE   Indication: Prolonged ileus  Patient Measurements: Height: 5' 1 (154.9 cm) Weight: 54.2 kg (119 lb 7.8 oz) IBW/kg (Calculated) : 47.8 TPN AdjBW (KG): 52.2 Body mass index is 22.58 kg/m.  Assessment: Patient is a 73 y.o F who presented to the ED on 03/27/24 with c/o abdominal pain and n/v. Abdominal CT on 03/27/24 showed SBO.   She subsequently underwent exp lap with small bowel resection, lysis of adhesions and enterorrhaphy on 04/01/24.  Pharmacy has been consulted on 04/05/24 to start TPN for prolonged ileus.  Glucose / Insulin : no hx DM.  -SSI & CBGs stopped on 11/22 Electrolytes: no labs today, on  11/24  WNL including CorrCa 9.3 -Goal for ileus: Mag > 2 and K > 4 Renal: Scr <1, BUN WNL Hepatic: 11/24 AST minimally elevated, albumin 3 Intake / Output; MIVF:   -720 ml PO documented -UOP x 5 occurrences -LBM 11/25 GI Imaging: -11/18 Abd xray: Reduced small bowel dilatation  -11/20 CT: small bowel dilatation, possibly due to postoperative ileus or partial obstruction at the level of the anastomosis. GI Surgeries / Procedures:  -11/13: exp lap with small bowel resection, lysis of adhesions and enterorrhaphy.   Central access: PICC TPN start date: 04/05/24   Nutritional Goals: Goal TPN rate is 70 mL/hr (provides 70 g of protein and 1632 kcals per day)  RD Assessment: Estimated Needs Total Energy Estimated Needs: 1550-1750 Total Protein Estimated Needs: 65-80g Total Fluid Estimated Needs: 1.7L/day  Current Nutrition:  TPN Advance to FLD 11/25 Boost Breeze TID  Plan:   -at 1800 decrease TPN to 1/2 rate per discussion with surgery PA -Electrolytes in TPN: Na 100 mEq/L, K 30 mEq/L, Ca 5 mEq/L, Mg 2 mEq/L, Phos 20 mmol/L. Cl:Ac 1:1 -change to PO MVI 11/26 -Monitor TPN labs on Mon/Thurs and PRN  Dominique Mooney, Pharm.D Use secure chat for questions 04/14/2024 7:23 AM

## 2024-04-14 NOTE — Plan of Care (Signed)
   Problem: Activity: Goal: Risk for activity intolerance will decrease Outcome: Adequate for Discharge   Problem: Nutrition: Goal: Adequate nutrition will be maintained Outcome: Adequate for Discharge

## 2024-04-14 NOTE — Progress Notes (Signed)
 PROGRESS NOTE  Dominique Mooney  FMW:[id number] DOB: December 17, 1950 DOA: 03/27/2024 PCP: Emerick Avelina POUR, PA-C   Brief Narrative: Patient is a 73 year old female with history of nonobstructive coronary artery disease, CKD stage IIIa, cognitive impairment, post ERCP pancreatitis, asthma, hypertension, hyperlipidemia who presented with abdominal pain.  CT abdomen/pelvis showed SBO with 2 adjacent transition points in the right lower quadrant and fecalization of the bowel within the transition point concerning for internal hernia or closed-loop obstruction.  Patient failed conservative management, underwent expiratory laparotomy with lysis of adhesions on 04/01/2024 by Dr. Pizza.  Had prolonged ileus postoperatively and was started on TPN on 04/06/2024.Repeat CT abdomen and pelvis done on 04/08/24 suggested possible ileus or partial SBO, fluid anterior to rectum and in LLQ, with some leukocytosis.  Leukocytosis has resolved.  Started having bowel movements.  Diet upgraded to soft diet, plan to wean TPN.  General surgery following.  TOC working on discharge to ALF  Assessment & Plan:  Principal Problem:   Small bowel obstruction (HCC) Active Problems:   Hypothyroidism   Hyperlipidemia   Mild persistent asthma   CAD in native artery   Chronic kidney disease, stage 3a (HCC)   Protein-calorie malnutrition, severe  Small bowel obstruction/prolonged postop ileus: Clinical scenario as above.  Patient started having bowel movements.  Leukocytosis resolved.  Currently on soft diet, plan to further taper TPN  Memory impairment/cognitive impairment: Continue delirium precaution, frequent reorientation.  MRI did not show any acute intracranial abnormalities  Hypothyroidism: History of noncompliance.  Currently on Synthyroid  History of nonobstructive coronary disease: Stable.  No anginal symptoms  Hyperlipidemia: Not on statin due to drug-induced liver injury  Hypertension: Currently normotensive.  Not  taking any medications at home  Hypokalemia/hypophosphatemia: Will continue to monitor and supplement as necessary  Normocytic anemia: Currently hemoglobin stable in the range of 8-9  Mild intermittent asthma: Stable  Hyponatremia: Resolved  Debility/deconditioning: PT recommended home health on discharge.  TOC working with family for discharge to assisted living facility.    Nutrition Problem: Severe Malnutrition Etiology: acute illness (SBO)    DVT prophylaxis:enoxaparin  (LOVENOX ) injection 40 mg Start: 04/06/24 1200 SCDs Start: 03/27/24 2136     Code Status: Full Code  Family Communication: None at bedside  Patient status:Inpatient  Patient is from :Home  Anticipated discharge to: ALF  Estimated DC date:after clearance from general surgery   Consultants: General surgery  Procedures: Exploratory laparotomy  Antimicrobials:  Anti-infectives (From admission, onward)    Start     Dose/Rate Route Frequency Ordered Stop   04/01/24 0800  cefTRIAXone (ROCEPHIN) 2 g in sodium chloride  0.9 % 100 mL IVPB  Status:  Discontinued        2 g 200 mL/hr over 30 Minutes Intravenous On call to O.R. 04/01/24 0700 04/01/24 0731   04/01/24 0730  cefoTEtan  (CEFOTAN ) 2 g in sodium chloride  0.9 % 100 mL IVPB        2 g 200 mL/hr over 30 Minutes Intravenous On call to O.R. 04/01/24 0640 04/01/24 1924   03/31/24 1300  cefoTEtan  (CEFOTAN ) 2 g in sodium chloride  0.9 % 100 mL IVPB  Status:  Discontinued        2 g 200 mL/hr over 30 Minutes Intravenous On call to O.R. 03/31/24 0909 03/31/24 1601   03/31/24 0945  cefTRIAXone (ROCEPHIN) 2 g in sodium chloride  0.9 % 100 mL IVPB  Status:  Discontinued        2 g 200 mL/hr over 30 Minutes Intravenous On  call to O.R. 03/31/24 9145 03/31/24 0909       Subjective: Patient seen and examined at bedside today.  Hemodynamically stable.  Very comfortable today.  No abdomen, nausea or vomiting per abdomen remains soft and nondistended.  Tolerating  full liquid diet.  Objective: Vitals:   04/13/24 1351 04/13/24 2012 04/14/24 0434 04/14/24 0513  BP: 107/68 113/64 101/61   Pulse: 74 85 72   Resp: 16 18 16    Temp: 98.1 F (36.7 C) 98.4 F (36.9 C) 98.5 F (36.9 C)   TempSrc: Oral Oral Oral   SpO2: 100% 100% 100%   Weight:    54.2 kg  Height:        Intake/Output Summary (Last 24 hours) at 04/14/2024 1142 Last data filed at 04/14/2024 1000 Gross per 24 hour  Intake 2600.11 ml  Output 750 ml  Net 1850.11 ml   Filed Weights   04/10/24 0500 04/12/24 0500 04/14/24 0513  Weight: 53 kg 53.3 kg 54.2 kg    Examination:  General exam: Overall comfortable, not in distress, pleasantly confused HEENT: PERRL Respiratory system:  no wheezes or crackles  Cardiovascular system: S1 & S2 heard, RRR.  Gastrointestinal system: Abdomen is nondistended, soft and nontender.  Staples on the lower abdomen from exploratory laparotomy Central nervous system: Alert and oriented Extremities: No edema, no clubbing ,no cyanosis Skin: No rashes, no ulcers,no icterus       Data Reviewed: I have personally reviewed following labs and imaging studies  CBC: Recent Labs  Lab 04/10/24 0328 04/11/24 0300 04/12/24 0245 04/13/24 0317 04/14/24 0149  WBC 12.4* 13.2* 9.9 8.7 7.1  HGB 9.2* 8.8* 8.7* 8.6* 7.9*  HCT 27.8* 26.5* 26.5* 26.2* 24.1*  MCV 92.7 93.3 93.3 93.9 94.5  PLT 343 329 356 372 361   Basic Metabolic Panel: Recent Labs  Lab 04/08/24 0135 04/09/24 0010 04/10/24 0328 04/11/24 0300 04/12/24 0245  NA 130* 130* 134* 134* 138  K 4.6 4.7 4.6 4.2 4.2  CL 95* 96* 103 103 105  CO2 27 27 23 25 26   GLUCOSE 132* 124* 106* 117* 111*  BUN 11 15 21 17 16   CREATININE 0.58 0.67 0.70 0.65 0.68  CALCIUM  8.8* 8.5* 8.2* 8.4* 8.5*  MG 2.3 2.4 2.6* 2.0 2.1  PHOS 3.2 3.6 3.3 3.2 3.3     No results found for this or any previous visit (from the past 240 hours).   Radiology Studies: No results found.  Scheduled Meds:  Chlorhexidine   Gluconate Cloth  6 each Topical Daily   enoxaparin  (LOVENOX ) injection  40 mg Subcutaneous Q24H   feeding supplement  1 Container Oral TID BM   levothyroxine   75 mcg Oral Q0600   melatonin  3 mg Oral QHS   multivitamin with minerals  1 tablet Oral Daily   tuberculin  5 Units Intradermal Once   Continuous Infusions:  TPN ADULT (ION) 70 mL/hr at 04/13/24 1734   TPN ADULT (ION)       LOS: 18 days   Ivonne Mustache, MD [facility] P11/26/2025, 11:42 AM

## 2024-04-15 DIAGNOSIS — K56609 Unspecified intestinal obstruction, unspecified as to partial versus complete obstruction: Secondary | ICD-10-CM | POA: Diagnosis not present

## 2024-04-15 LAB — CBC
HCT: 24.5 % — ABNORMAL LOW (ref 36.0–46.0)
Hemoglobin: 8.1 g/dL — ABNORMAL LOW (ref 12.0–15.0)
MCH: 31 pg (ref 26.0–34.0)
MCHC: 33.1 g/dL (ref 30.0–36.0)
MCV: 93.9 fL (ref 80.0–100.0)
Platelets: 419 K/uL — ABNORMAL HIGH (ref 150–400)
RBC: 2.61 MIL/uL — ABNORMAL LOW (ref 3.87–5.11)
RDW: 12.6 % (ref 11.5–15.5)
WBC: 6 K/uL (ref 4.0–10.5)
nRBC: 0 % (ref 0.0–0.2)

## 2024-04-15 LAB — COMPREHENSIVE METABOLIC PANEL WITH GFR
ALT: 22 U/L (ref 0–44)
AST: 37 U/L (ref 15–41)
Albumin: 2.9 g/dL — ABNORMAL LOW (ref 3.5–5.0)
Alkaline Phosphatase: 68 U/L (ref 38–126)
Anion gap: 7 (ref 5–15)
BUN: 15 mg/dL (ref 8–23)
CO2: 26 mmol/L (ref 22–32)
Calcium: 8.7 mg/dL — ABNORMAL LOW (ref 8.9–10.3)
Chloride: 104 mmol/L (ref 98–111)
Creatinine, Ser: 0.8 mg/dL (ref 0.44–1.00)
GFR, Estimated: >60 mL/min (ref >60)
Glucose, Bld: 100 mg/dL — ABNORMAL HIGH (ref 70–99)
Potassium: 4 mmol/L (ref 3.5–5.1)
Sodium: 138 mmol/L (ref 135–145)
Total Bilirubin: 0.3 mg/dL (ref 0.0–1.2)
Total Protein: 6.7 g/dL (ref 6.5–8.1)

## 2024-04-15 LAB — PHOSPHORUS: Phosphorus: 3.2 mg/dL (ref 2.5–4.6)

## 2024-04-15 LAB — MAGNESIUM: Magnesium: 2.1 mg/dL (ref 1.7–2.4)

## 2024-04-15 NOTE — Plan of Care (Signed)
   Problem: Education: Goal: Knowledge of General Education information will improve Description Including pain rating scale, medication(s)/side effects and non-pharmacologic comfort measures Outcome: Progressing

## 2024-04-15 NOTE — Progress Notes (Signed)
 04/15/2024  Dominique Mooney [id number] 13-Nov-1950  CARE TEAM: PCP: Emerick Avelina POUR, PA-C  Outpatient Care Team: Patient Care Team: [name], [name], PA-C as PCP - General (Physician Assistant) [name], MD as PCP - Cardiology (Cardiology)  Inpatient Treatment Team: Treatment Team:  Jillian Buttery, MD Ccs, Md, MD Joannie Leader, [place] Bobbette Pinon, MD Cloud, Ole LOISE Feliberto Darrelyn, RN Vicci Wilbert NOVAK, RN Mahabir, Nathanel, RN   Problem List:   Principal Problem:   Small bowel obstruction [facility]) Active Problems:   Hypothyroidism   Hyperlipidemia   Mild persistent asthma   CAD in native artery   Chronic kidney disease, stage 3a (HCC)   Protein-calorie malnutrition, severe   04/01/2024  PRE-OPERATIVE DIAGNOSIS: Small bowel obstruction   POST-OPERATIVE DIAGNOSIS:  Same   PROCEDURE:   1.  Exploratory laparotomy with small bowel resection 2.  Lysis of adhesions x 80 minutes 3.  Enterorrhaphy (repair of small bowel serosa)   SURGEON:  Lonni HERO. Helbert, MD   FINDINGS:  Adhesions to abdominal wall related to prior surgery in the low midline.  Additionally, pelvic adhesions containing small bowel related prior surgery.  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  The stricturing segment does appear quite chronic and fairly dense.  Bowel is viable but given the evident stricturing at this location, we opted to resect the segment of small bowel.  1 small deserosalization noted from a dense matted adhesion which was repaired.  This was inherent to the nature of the procedure.  No other significant adhesions noted at conclusion of procedure.    Assessment [facility] Stay = 19 days) 14 Days Post-Op    Recovering    Assessment/Plan POD 14: s/p ex lap with LOA and SBR for SBO, Dr. Pizza 11/13 -Had postop CT 11/20 that was not concerning. -Afebrile. -DC stable since 2 weeks postop switch to  Steri-Strips -Hemoglobin stable. -Soluble fiber regimen. - Tolerating soft diet.  Advance to regular diet.   - Wean off TPN . -Will decrease TPN to half. -Mobilize, pulm toilet -Multi-modal pain control   FEN -regular diet being off TPN VTE - Lovenox  ID - Rocephin perioperatively.  No reason to continue beyond that or restart.  -mobilize as tolerated to help recovery.  Enlist therapies in moderate/high risk patients as appropriate  I updated the patient's status to the patient and nurse  Recommendations were made.  Questions were answered.  They expressed understanding & appreciation.  -Disposition: Improving.  Hopefully close to getting to discharge goals.  See how she does off TPN.  May be home in 1-2 days.  See with primary service thanks.      I reviewed nursing notes, hospitalist notes, last 24 h vitals and pain scores, last 48 h intake and output, last 24 h labs and trends, and last 24 h imaging results.  I have reviewed this patient's available data, including medical history, events of note, test results, etc as part of my evaluation.   A significant portion of that time was spent in counseling. Care during the described time interval was provided by me.  This care required moderate level of medical decision making.  04/15/2024    Subjective: (Chief complaint)  Feeling better.  Smiling.  Tolerating soft food.  Denies much pain.  Objective:  Vital signs:  Vitals:   04/14/24 0513 04/14/24 1319 04/14/24 2017 04/15/24 0557  BP:  108/68 104/70 101/62  Pulse:  81 73 65  Resp:  16 15 15   Temp:  98.3 F (36.8 C) 98.6 F (37 C) 98.3 F (36.8 C)  TempSrc:  Oral Oral Oral  SpO2:  99% 99% 100%  Weight: 54.2 kg     Height:        Last BM Date : 04/13/24  Intake/Output   Yesterday:  11/26 0701 - 11/27 0700 In: 1654.4 [P.O.:660; I.V.:994.4] Out: 900 [Urine:900] This shift:  Total I/O In: 317.7 [P.O.:240; I.V.:77.7] Out: -   Bowel function:  Flatus:  YES  BM:  No  Drain: (No drain)   Physical Exam:  General: Pt awake/alert in no acute distress Eyes: PERRL, normal EOM.  Sclera clear.  No icterus Neuro: CN II-XII intact w/o focal sensory/motor deficits. Lymph: No head/neck/groin lymphadenopathy Psych:  No delerium/psychosis/paranoia.  Oriented x 4 HENT: Normocephalic, Mucus membranes moist.  No thrush Neck: Supple, No tracheal deviation.  No obvious thyromegaly Chest: No pain to chest wall compression.  Good respiratory excursion.  No audible wheezing CV:  Pulses intact.  Regular rhythm.  No major extremity edema MS: Normal AROM mjr joints.  No obvious deformity  Abdomen: Soft.  Nondistended.  Mildly tender at incisions only.  Low midline incision closed with staples in place.  No evidence of peritonitis.  No incarcerated hernias.  Ext:   No deformity.  No mjr edema.  No cyanosis Skin: No petechiae / purpurea.  No major sores.  Warm and dry    Results:   Cultures: No results found for this or any previous visit (from the past 720 hours).  Labs: Results for orders placed or performed during the hospital encounter of 03/27/24 (from the past 48 hours)  CBC     Status: Abnormal   Collection Time: 04/14/24  1:49 AM  Result Value Ref Range   WBC 7.1 4.0 - 10.5 K/uL   RBC 2.55 (L) 3.87 - 5.11 MIL/uL   Hemoglobin 7.9 (L) 12.0 - 15.0 g/dL   HCT 75.8 (L) 63.9 - 53.9 %   MCV 94.5 80.0 - 100.0 fL   MCH 31.0 26.0 - 34.0 pg   MCHC 32.8 30.0 - 36.0 g/dL   RDW 87.4 88.4 - 84.4 %   Platelets 361 150 - 400 K/uL   nRBC 0.0 0.0 - 0.2 %    Comment: Performed at [facility], [address], [place], [place] [address]  Comprehensive metabolic panel     Status: Abnormal   Collection Time: 04/15/24  2:55 AM  Result Value Ref Range   Sodium 138 135 - 145 mmol/L   Potassium 4.0 3.5 - 5.1 mmol/L   Chloride 104 98 - 111 mmol/L   CO2 26 22 - 32 mmol/L   Glucose, Bld 100 (H) 70 - 99 mg/dL    Comment: Glucose reference  range applies only to samples taken after fasting for at least 8 hours.   BUN 15 8 - 23 mg/dL   Creatinine, Ser 9.19 0.44 - 1.00 mg/dL   Calcium  8.7 (L) 8.9 - 10.3 mg/dL   Total Protein 6.7 6.5 - 8.1 g/dL   Albumin 2.9 (L) 3.5 - 5.0 g/dL   AST 37 15 - 41 U/L   ALT 22 0 - 44 U/L   Alkaline Phosphatase 68 38 - 126 U/L   Total Bilirubin 0.3 0.0 - 1.2 mg/dL   GFR, Estimated >39 >39 mL/min    Comment: (NOTE) Calculated using the CKD-EPI Creatinine Equation (2021)    Anion gap 7 5 - 15  Comment: Performed at [facility], [address], [place], [place] [address]  Magnesium      Status: None   Collection Time: 04/15/24  2:55 AM  Result Value Ref Range   Magnesium  2.1 1.7 - 2.4 mg/dL    Comment: Performed at [facility], [address] [place], [place] [address]  Phosphorus     Status: None   Collection Time: 04/15/24  2:55 AM  Result Value Ref Range   Phosphorus 3.2 2.5 - 4.6 mg/dL    Comment: Performed at [facility], [address] [place], [place] [address]  CBC     Status: Abnormal   Collection Time: 04/15/24  2:55 AM  Result Value Ref Range   WBC 6.0 4.0 - 10.5 K/uL   RBC 2.61 (L) 3.87 - 5.11 MIL/uL   Hemoglobin 8.1 (L) 12.0 - 15.0 g/dL   HCT 75.4 (L) 63.9 - 53.9 %   MCV 93.9 80.0 - 100.0 fL   MCH 31.0 26.0 - 34.0 pg   MCHC 33.1 30.0 - 36.0 g/dL   RDW 87.3 88.4 - 84.4 %   Platelets 419 (H) 150 - 400 K/uL   nRBC 0.0 0.0 - 0.2 %    Comment: Performed at [facility], [address] [place], [place] [address]    Imaging / Studies: No results found.  Medications / Allergies: per chart  Antibiotics: Anti-infectives (From admission, onward)    Start     Dose/Rate Route Frequency Ordered Stop   04/01/24 0800  cefTRIAXone (ROCEPHIN) 2 g in sodium chloride  0.9 % 100 mL IVPB  Status:  Discontinued        2 g 200 mL/hr over 30 Minutes Intravenous On call to O.R. 04/01/24 0700 04/01/24 0731    04/01/24 0730  cefoTEtan  (CEFOTAN ) 2 g in sodium chloride  0.9 % 100 mL IVPB        2 g 200 mL/hr over 30 Minutes Intravenous On call to O.R. 04/01/24 0640 04/01/24 1924   03/31/24 1300  cefoTEtan  (CEFOTAN ) 2 g in sodium chloride  0.9 % 100 mL IVPB  Status:  Discontinued        2 g 200 mL/hr over 30 Minutes Intravenous On call to O.R. 03/31/24 0909 03/31/24 1601   03/31/24 0945  cefTRIAXone (ROCEPHIN) 2 g in sodium chloride  0.9 % 100 mL IVPB  Status:  Discontinued        2 g 200 mL/hr over 30 Minutes Intravenous On call to O.R. 03/31/24 0854 03/31/24 0909         Note: Portions of this report may have been transcribed using voice recognition software. Every effort was made to ensure accuracy; however, inadvertent computerized transcription errors may be present.   Any transcriptional errors that result from this process are unintentional.    Elspeth KYM Schultze, MD, FACS, MASCRS Esophageal, Gastrointestinal & Colorectal Surgery Robotic and Minimally Invasive Surgery  [facility] [address] [place], [place] [address] [phone] Fax [phone] Main  CONTACT INFORMATION: Weekday (9AM-5PM): Call [facility] main office at [phone] Weeknight (5PM-9AM) or Weekend/Holiday: Check EPIC Web Links tab & use AMION (password  [email or web address]) for General Surgery [facility] coverage  Please, DO NOT use SecureChat  (it is not reliable communication to reach operating surgeons & will lead to a delay in care).   Epic staff messaging available for outpatient concerns needing 1-2 business day response.  04/15/2024  9:10 AM

## 2024-04-15 NOTE — Plan of Care (Signed)

## 2024-04-15 NOTE — Progress Notes (Deleted)
 PHARMACY - TOTAL PARENTERAL NUTRITION CONSULT NOTE   Indication: Prolonged ileus  Patient Measurements: Height: 5' 1 (154.9 cm) Weight: 54.2 kg (119 lb 7.8 oz) IBW/kg (Calculated) : 47.8 TPN AdjBW (KG): 52.2 Body mass index is 22.58 kg/m.  Assessment: Patient is a 73 y.o F who presented to the ED on 03/27/24 with c/o abdominal pain and N/V. Abdominal CT on 03/27/24 showed SBO. She subsequently underwent exp lap with small bowel resection, lysis of adhesions and enterorrhaphy on 04/01/24.  Pharmacy has been consulted on 04/05/24 to start TPN for prolonged ileus.  Glucose / Insulin : no hx DM.  -SSI & CBGs stopped on 11/22  Electrolytes: Lytes WNL including K4, Mg 2.1, CoCa 9.58 -Goal for ileus: Mag >= 2 and K >= 4 Renal: Scr <1, BUN WNL Hepatic: LFT's normalized. Albumin 2.9 TG 60 Intake / Output; MIVF:   -660 ml PO documented + 2 Boost Breeze -UOP: + 4 unmeasured -LBM 11/25  GI Imaging: -11/18 Abd xray: Reduced small bowel dilatation  -11/20 CT: small bowel dilatation, possibly due to postoperative ileus or partial obstruction at the level of the anastomosis. GI Surgeries / Procedures:  -11/13: exp lap with small bowel resection, lysis of adhesions and enterorrhaphy.   Central access: PICC TPN start date: 04/05/24   Nutritional Goals: Goal TPN rate is 70 mL/hr (provides 70 g of protein and 1632 kcals per day)  RD Assessment: Estimated Needs Total Energy Estimated Needs: 1550-1750 Total Protein Estimated Needs: 65-80g Total Fluid Estimated Needs: 1.7L/day  Current Nutrition:  TPN Soft diet Boost Breeze TID  Plan:   -at 1800 decrease TPN to 1/2 rate per discussion with surgery PA -Electrolytes in TPN: Na 100 mEq/L, K 30 mEq/L, Ca 5 mEq/L, Mg 2 mEq/L, Phos 20 mmol/L. Cl:Ac 1:1 -change to PO MVI 11/26 -Monitor TPN labs on Mon/Thurs and PRN  Edric Fetterman Karoline Marina, PharmD, BCPS Clinical Staff Pharmacist 04/15/2024 8:49 AM

## 2024-04-15 NOTE — Progress Notes (Signed)
 PROGRESS NOTE  Dominique Mooney  FMW:[id number] DOB: Oct 14, 1950 DOA: 03/27/2024 PCP: Emerick Avelina POUR, PA-C   Brief Narrative: Patient is a 73 year old female with history of nonobstructive coronary artery disease, CKD stage IIIa, cognitive impairment, post ERCP pancreatitis, asthma, hypertension, hyperlipidemia who presented with abdominal pain.  CT abdomen/pelvis showed SBO with 2 adjacent transition points in the right lower quadrant and fecalization of the bowel within the transition point concerning for internal hernia or closed-loop obstruction.  Patient failed conservative management, underwent expiratory laparotomy with lysis of adhesions on 04/01/2024 by Dr. Pizza.  Had prolonged ileus postoperatively and was started on TPN on 04/06/2024.Repeat CT abdomen and pelvis done on 04/08/24 suggested possible ileus or partial SBO, fluid anterior to rectum and in LLQ, with some leukocytosis.  Leukocytosis has resolved.  Started having bowel movements.  Diet upgraded to soft diet, plan to wean TPN.  General surgery following.  TOC working on discharge to ALF  Assessment & Plan:  Principal Problem:   Small bowel obstruction (HCC) Active Problems:   Hypothyroidism   Hyperlipidemia   Mild persistent asthma   CAD in native artery   Chronic kidney disease, stage 3a (HCC)   Protein-calorie malnutrition, severe  Small bowel obstruction/prolonged postop ileus: Clinical scenario as above.  Patient started having bowel movements.  Leukocytosis resolved.  Currently on regular diet, plan to further taper TPN  Memory impairment/cognitive impairment: Continue delirium precaution, frequent reorientation.  MRI did not show any acute intracranial abnormalities  Hypothyroidism: History of noncompliance.  Currently on Synthyroid  History of nonobstructive coronary disease: Stable.  No anginal symptoms  Hyperlipidemia: Not on statin due to drug-induced liver injury  Hypertension: Currently normotensive.  Not  taking any medications at home  Hypokalemia/hypophosphatemia: Will continue to monitor and supplement as necessary  Normocytic anemia: Currently hemoglobin stable in the range of 8-9  Mild intermittent asthma: Stable  Hyponatremia: Resolved  Debility/deconditioning: PT recommended home health on discharge.  TOC working with family for discharge to assisted living facility.    Nutrition Problem: Severe Malnutrition Etiology: acute illness (SBO)    DVT prophylaxis:enoxaparin  (LOVENOX ) injection 40 mg Start: 04/06/24 1200 SCDs Start: 03/27/24 2136     Code Status: Full Code  Family Communication: None at bedside  Patient status:Inpatient  Patient is from :Home  Anticipated discharge to: ALF  Estimated DC date:after clearance from general surgery   Consultants: General surgery  Procedures: Exploratory laparotomy  Antimicrobials:  Anti-infectives (From admission, onward)    Start     Dose/Rate Route Frequency Ordered Stop   04/01/24 0800  cefTRIAXone (ROCEPHIN) 2 g in sodium chloride  0.9 % 100 mL IVPB  Status:  Discontinued        2 g 200 mL/hr over 30 Minutes Intravenous On call to O.R. 04/01/24 0700 04/01/24 0731   04/01/24 0730  cefoTEtan  (CEFOTAN ) 2 g in sodium chloride  0.9 % 100 mL IVPB        2 g 200 mL/hr over 30 Minutes Intravenous On call to O.R. 04/01/24 0640 04/01/24 1924   03/31/24 1300  cefoTEtan  (CEFOTAN ) 2 g in sodium chloride  0.9 % 100 mL IVPB  Status:  Discontinued        2 g 200 mL/hr over 30 Minutes Intravenous On call to O.R. 03/31/24 0909 03/31/24 1601   03/31/24 0945  cefTRIAXone (ROCEPHIN) 2 g in sodium chloride  0.9 % 100 mL IVPB  Status:  Discontinued        2 g 200 mL/hr over 30 Minutes Intravenous On  call to O.R. 03/31/24 9145 03/31/24 0909       Subjective: Patient seen and examined at bedside today.  On regular diet.  Comfortable.  In the abdomen, nausea vomiting.  Having bowel movements.  No new complaints.  Objective: Vitals:    04/14/24 0513 04/14/24 1319 04/14/24 2017 04/15/24 0557  BP:  108/68 104/70 101/62  Pulse:  81 73 65  Resp:  16 15 15   Temp:  98.3 F (36.8 C) 98.6 F (37 C) 98.3 F (36.8 C)  TempSrc:  Oral Oral Oral  SpO2:  99% 99% 100%  Weight: 54.2 kg     Height:        Intake/Output Summary (Last 24 hours) at 04/15/2024 1025 Last data filed at 04/15/2024 9072 Gross per 24 hour  Intake 1952.15 ml  Output 150 ml  Net 1802.15 ml   Filed Weights   04/10/24 0500 04/12/24 0500 04/14/24 0513  Weight: 53 kg 53.3 kg 54.2 kg    Examination:   General exam: Overall comfortable, not in distress, pleasantly confused HEENT: PERRL Respiratory system:  no wheezes or crackles  Cardiovascular system: S1 & S2 heard, RRR.  Gastrointestinal system: Abdomen is nondistended, soft and nontender.  Staples on the lower abdomen from exploratory laparotomy Central nervous system: Alert and oriented Extremities: No edema, no clubbing ,no cyanosis Skin: No rashes, no ulcers,no icterus     Data Reviewed: I have personally reviewed following labs and imaging studies  CBC: Recent Labs  Lab 04/11/24 0300 04/12/24 0245 04/13/24 0317 04/14/24 0149 04/15/24 0255  WBC 13.2* 9.9 8.7 7.1 6.0  HGB 8.8* 8.7* 8.6* 7.9* 8.1*  HCT 26.5* 26.5* 26.2* 24.1* 24.5*  MCV 93.3 93.3 93.9 94.5 93.9  PLT 329 356 372 361 419*   Basic Metabolic Panel: Recent Labs  Lab 04/09/24 0010 04/10/24 0328 04/11/24 0300 04/12/24 0245 04/15/24 0255  NA 130* 134* 134* 138 138  K 4.7 4.6 4.2 4.2 4.0  CL 96* 103 103 105 104  CO2 27 23 25 26 26   GLUCOSE 124* 106* 117* 111* 100*  BUN 15 21 17 16 15   CREATININE 0.67 0.70 0.65 0.68 0.80  CALCIUM  8.5* 8.2* 8.4* 8.5* 8.7*  MG 2.4 2.6* 2.0 2.1 2.1  PHOS 3.6 3.3 3.2 3.3 3.2     No results found for this or any previous visit (from the past 240 hours).   Radiology Studies: No results found.  Scheduled Meds:  Chlorhexidine  Gluconate Cloth  6 each Topical Daily   enoxaparin   (LOVENOX ) injection  40 mg Subcutaneous Q24H   feeding supplement  1 Container Oral TID BM   levothyroxine   75 mcg Oral Q0600   melatonin  3 mg Oral QHS   multivitamin with minerals  1 tablet Oral Daily   tuberculin  5 Units Intradermal Once   Continuous Infusions:  TPN ADULT (ION) 35 mL/hr at 04/14/24 1716     LOS: 19 days   Ivonne Mustache, MD [facility] P11/27/2025, 10:25 AM

## 2024-04-16 DIAGNOSIS — K56609 Unspecified intestinal obstruction, unspecified as to partial versus complete obstruction: Secondary | ICD-10-CM | POA: Diagnosis not present

## 2024-04-16 NOTE — Progress Notes (Signed)
 PROGRESS NOTE  Dominique Mooney  FMW:[id number] DOB: 07/23/1950 DOA: 03/27/2024 PCP: Emerick Avelina POUR, PA-C   Brief Narrative: Patient is a 73 year old female with history of nonobstructive coronary artery disease, CKD stage IIIa, cognitive impairment, post ERCP pancreatitis, asthma, hypertension, hyperlipidemia who presented with abdominal pain.  CT abdomen/pelvis showed SBO with 2 adjacent transition points in the right lower quadrant and fecalization of the bowel within the transition point concerning for internal hernia or closed-loop obstruction.  Patient failed conservative management, underwent expiratory laparotomy with lysis of adhesions on 04/01/2024 by Dr. Pizza.  Had prolonged ileus postoperatively and was started on TPN on 04/06/2024.Repeat CT abdomen and pelvis done on 04/08/24 suggested possible ileus or partial SBO, fluid anterior to rectum and in LLQ, with some leukocytosis.  Leukocytosis has resolved.  Started having bowel movements.  Diet upgraded to regular and TPN  has been turned off.  General surgery cleared for dc  TOC working on discharge to ALF.  Medically stable for discharge to ALF whenever possible.  Assessment & Plan:  Principal Problem:   Small bowel obstruction (HCC) Active Problems:   Hypothyroidism   Hyperlipidemia   Mild persistent asthma   CAD in native artery   Chronic kidney disease, stage 3a (HCC)   Protein-calorie malnutrition, severe  Small bowel obstruction/prolonged postop ileus: Clinical scenario as above.  Patient started having bowel movements.  Leukocytosis resolved.  Currently on regular diet, stopped TPN  Memory impairment/cognitive impairment: Continue delirium precaution, frequent reorientation.  MRI did not show any acute intracranial abnormalities  Hypothyroidism: History of noncompliance.  Currently on Synthyroid  History of nonobstructive coronary disease: Stable.  No anginal symptoms  Hyperlipidemia: Not on statin due to drug-induced  liver injury  Hypertension: Currently normotensive.  Not taking any medications at home  Hypokalemia/hypophosphatemia: Will continue to monitor and supplement as necessary  Normocytic anemia: Currently hemoglobin stable in the range of 8-9  Mild intermittent asthma: Stable  Hyponatremia: Resolved  Debility/deconditioning: PT recommended home health on discharge.  TOC working with family for discharge to assisted living facility.    Nutrition Problem: Severe Malnutrition Etiology: acute illness (SBO)    DVT prophylaxis:enoxaparin  (LOVENOX ) injection 40 mg Start: 04/06/24 1200 SCDs Start: 03/27/24 2136     Code Status: Full Code  Family Communication: None at bedside  Patient status:Inpatient  Patient is from :Home  Anticipated discharge to: ALF  Estimated DC date: Whenever possible   Consultants: General surgery  Procedures: Exploratory laparotomy  Antimicrobials:  Anti-infectives (From admission, onward)    Start     Dose/Rate Route Frequency Ordered Stop   04/01/24 0800  cefTRIAXone (ROCEPHIN) 2 g in sodium chloride  0.9 % 100 mL IVPB  Status:  Discontinued        2 g 200 mL/hr over 30 Minutes Intravenous On call to O.R. 04/01/24 0700 04/01/24 0731   04/01/24 0730  cefoTEtan  (CEFOTAN ) 2 g in sodium chloride  0.9 % 100 mL IVPB        2 g 200 mL/hr over 30 Minutes Intravenous On call to O.R. 04/01/24 0640 04/01/24 1924   03/31/24 1300  cefoTEtan  (CEFOTAN ) 2 g in sodium chloride  0.9 % 100 mL IVPB  Status:  Discontinued        2 g 200 mL/hr over 30 Minutes Intravenous On call to O.R. 03/31/24 0909 03/31/24 1601   03/31/24 0945  cefTRIAXone (ROCEPHIN) 2 g in sodium chloride  0.9 % 100 mL IVPB  Status:  Discontinued        2  g 200 mL/hr over 30 Minutes Intravenous On call to O.R. 03/31/24 9145 03/31/24 0909       Subjective: Patient seen and examined the bedside today.  She was freely ambulating on the hallway asking to remove the PICC line.  Denies abdomen,  nausea or vomiting.  Tolerating regular diet.  TPN has been turned off since yesterday  Objective: Vitals:   04/15/24 0557 04/15/24 1359 04/15/24 2012 04/16/24 0505  BP: 101/62 126/79 121/64 112/73  Pulse: 65 71 72 71  Resp: 15  18 18   Temp: 98.3 F (36.8 C) 98.2 F (36.8 C) 98.1 F (36.7 C) 98 F (36.7 C)  TempSrc: Oral Oral Oral Oral  SpO2: 100% 100% 100% 100%  Weight:      Height:        Intake/Output Summary (Last 24 hours) at 04/16/2024 1026 Last data filed at 04/16/2024 1000 Gross per 24 hour  Intake 923.52 ml  Output --  Net 923.52 ml   Filed Weights   04/10/24 0500 04/12/24 0500 04/14/24 0513  Weight: 53 kg 53.3 kg 54.2 kg    Examination:   General exam: Overall comfortable, not in distress, pleasantly confused HEENT: PERRL Respiratory system:  no wheezes or crackles  Cardiovascular system: S1 & S2 heard, RRR.  Gastrointestinal system: Abdomen is nondistended, soft and nontender.  Staples on the lower abdomen from exploratory laparotomy Central nervous system: Alert and awake, oriented to place only Extremities: No edema, no clubbing ,no cyanosis Skin: No rashes, no ulcers,no icterus     Data Reviewed: I have personally reviewed following labs and imaging studies  CBC: Recent Labs  Lab 04/11/24 0300 04/12/24 0245 04/13/24 0317 04/14/24 0149 04/15/24 0255  WBC 13.2* 9.9 8.7 7.1 6.0  HGB 8.8* 8.7* 8.6* 7.9* 8.1*  HCT 26.5* 26.5* 26.2* 24.1* 24.5*  MCV 93.3 93.3 93.9 94.5 93.9  PLT 329 356 372 361 419*   Basic Metabolic Panel: Recent Labs  Lab 04/10/24 0328 04/11/24 0300 04/12/24 0245 04/15/24 0255  NA 134* 134* 138 138  K 4.6 4.2 4.2 4.0  CL 103 103 105 104  CO2 23 25 26 26   GLUCOSE 106* 117* 111* 100*  BUN 21 17 16 15   CREATININE 0.70 0.65 0.68 0.80  CALCIUM  8.2* 8.4* 8.5* 8.7*  MG 2.6* 2.0 2.1 2.1  PHOS 3.3 3.2 3.3 3.2     No results found for this or any previous visit (from the past 240 hours).   Radiology Studies: No  results found.  Scheduled Meds:  Chlorhexidine  Gluconate Cloth  6 each Topical Daily   enoxaparin  (LOVENOX ) injection  40 mg Subcutaneous Q24H   feeding supplement  1 Container Oral TID BM   levothyroxine   75 mcg Oral Q0600   melatonin  3 mg Oral QHS   multivitamin with minerals  1 tablet Oral Daily   Continuous Infusions:     LOS: 20 days   Ivonne Mustache, MD [facility] P11/28/2025, 10:26 AM

## 2024-04-16 NOTE — Progress Notes (Signed)
 Physical Therapy Treatment and discharge  Patient Details Name: Dominique Mooney MRN: [id number] DOB: [birth year] Today's Date: 04/16/2024   History of Present Illness Dominique Mooney is a 73 yo female, s/p ex lap, lysis of adhesions and enterorrhapy (repair of small bowel serosa) 11/13 for SBO. PMH: nonobstructive CAD, CKD-3A, memory loss,  DILI due to statin, post ERCP pancreatitis in 2000, asthma, HTN and HLD    PT Comments   Pt admitted with above diagnosis.  Pt currently with functional limitations due to the deficits listed below (see PT Problem List). PT entered room and pt not in room or bathroom, pt entered room following therapist and had been up dressed and  ambulating in the hallway. Pt is mod I for all mobility tasks, however exhibits some decreased safety awareness ie the leg rest to the recliner elevated, lights off in room and pt ambulating pt will benefit from S, no AD no overt LOB with any functional mobility tasks, pt able to participate with dynamic gait tasks in hallway 500 feet. No pain report at this time. Family is determining d/c setting in which pt will have 24/7 supervision and assist due to memory deficits. Pt elected to remain standing in room at end of PT session and buttoning gown. No further acute PT needs identified. Please see below for any follow-up Physical Therapy or DME needs. PT is signing off. Thank you for this referral.     If plan is discharge home, recommend the following: A little help with bathing/dressing/bathroom;Assistance with cooking/housework;Help with stairs or ramp for entrance;Assist for transportation;Supervision due to cognitive status   Can travel by private vehicle        Equipment Recommendations  None recommended by PT    Recommendations for Other Services       Precautions / Restrictions Precautions Precautions: Fall Recall of Precautions/Restrictions: Impaired Precaution/Restrictions Comments: abdominal surgery,  TPN Restrictions Weight Bearing Restrictions Per Provider Order: No     Mobility  Bed Mobility               General bed mobility comments: pt entered room following therapist    Transfers Overall transfer level: Modified independent                 General transfer comment: pt will require S for safety, however has been completing bed, mobility, transfers and gait tasks mod I    Ambulation/Gait Ambulation/Gait assistance: Modified independent (Device/Increase time) Gait Distance (Feet): 500 Feet Assistive device: None Gait Pattern/deviations: Step-through pattern Gait velocity: wfl     General Gait Details: no overt LOB, pt is able to change direction, gait velocity, perform head turns and nods, navigate obstacles, pt will benefit from S for safety and direction. pt has been ambulating in personal room and in hallway a majority of the day   Stairs             Wheelchair Mobility     Tilt Bed    Modified Rankin (Stroke Patients Only)       Balance Overall balance assessment: Mild deficits observed, not formally tested Sitting-balance support: Feet supported Sitting balance-Leahy Scale: Good     Standing balance support: No upper extremity supported Standing balance-Leahy Scale: Normal (no overt LOB, pt able to change levels, carry items in room and navigate obstalces in room and when in hallway)  Communication Communication Communication: No apparent difficulties  Cognition Arousal: Alert Behavior During Therapy: WFL for tasks assessed/performed, Anxious (mild)   PT - Cognitive impairments: History of cognitive impairments                         Following commands: Impaired Following commands impaired: Only follows one step commands consistently    Cueing Cueing Techniques: Verbal cues, Gestural cues, Visual cues, Tactile cues  Exercises      General Comments        Pertinent  Vitals/Pain Pain Assessment Pain Assessment: No/denies pain    Home Living                          Prior Function            PT Goals (current goals can now be found in the care plan section) Acute Rehab PT Goals Patient Stated Goal: none stated PT Goal Formulation: With patient/family Time For Goal Achievement: 04/21/24 Potential to Achieve Goals: Good Progress towards PT goals: Progressing toward goals    Frequency    Min 3X/week      PT Plan      Co-evaluation              AM-PAC PT 6 Clicks Mobility   Outcome Measure  Help needed turning from your back to your side while in a flat bed without using bedrails?: None Help needed moving from lying on your back to sitting on the side of a flat bed without using bedrails?: None Help needed moving to and from a bed to a chair (including a wheelchair)?: None Help needed standing up from a chair using your arms (e.g., wheelchair or bedside chair)?: None Help needed to walk in hospital room?: None Help needed climbing 3-5 steps with a railing? : A Little 6 Click Score: 23    End of Session Equipment Utilized During Treatment: Gait belt Activity Tolerance: Patient tolerated treatment well Patient left: Other (comment) (pt electing to stand and button gown) Nurse Communication: Mobility status PT Visit Diagnosis: Other abnormalities of gait and mobility (R26.89)     Time: 8343-8292 PT Time Calculation (min) (ACUTE ONLY): 11 min  Charges:    $Gait Training: 8-22 mins PT General Charges $$ ACUTE PT VISIT: 1 Visit                     Dominique Mooney, PT Acute Rehab    Dominique Mooney Dominique Mooney 04/16/2024, 6:34 PM

## 2024-04-16 NOTE — Progress Notes (Signed)
 15 Days Post-Op   Subjective/Chief Complaint: No acute issues overnight Tolerating po   Objective: Vital signs in last 24 hours: Temp:  [98 F (36.7 C)-98.2 F (36.8 C)] 98 F (36.7 C) (11/28 0505) Pulse Rate:  [71-72] 71 (11/28 0505) Resp:  [18] 18 (11/28 0505) BP: (112-126)/(64-79) 112/73 (11/28 0505) SpO2:  [100 %] 100 % (11/28 0505) Last BM Date : 04/13/24  Intake/Output from previous day: 11/27 0701 - 11/28 0700 In: 1481.2 [P.O.:1200; I.V.:281.2] Out: -  Intake/Output this shift: No intake/output data recorded.  Exam: Awake and alert, walking the halls Abdomen soft, incision healing, non-distended  Lab Results:  Recent Labs    04/14/24 0149 04/15/24 0255  WBC 7.1 6.0  HGB 7.9* 8.1*  HCT 24.1* 24.5*  PLT 361 419*   BMET Recent Labs    04/15/24 0255  NA 138  K 4.0  CL 104  CO2 26  GLUCOSE 100*  BUN 15  CREATININE 0.80  CALCIUM  8.7*   PT/INR No results for input(s): LABPROT, INR in the last 72 hours. ABG No results for input(s): PHART, HCO3 in the last 72 hours.  Invalid input(s): PCO2, PO2  Studies/Results: No results found.  Anti-infectives: Anti-infectives (From admission, onward)    Start     Dose/Rate Route Frequency Ordered Stop   04/01/24 0800  cefTRIAXone (ROCEPHIN) 2 g in sodium chloride  0.9 % 100 mL IVPB  Status:  Discontinued        2 g 200 mL/hr over 30 Minutes Intravenous On call to O.R. 04/01/24 0700 04/01/24 0731   04/01/24 0730  cefoTEtan  (CEFOTAN ) 2 g in sodium chloride  0.9 % 100 mL IVPB        2 g 200 mL/hr over 30 Minutes Intravenous On call to O.R. 04/01/24 0640 04/01/24 1924   03/31/24 1300  cefoTEtan  (CEFOTAN ) 2 g in sodium chloride  0.9 % 100 mL IVPB  Status:  Discontinued        2 g 200 mL/hr over 30 Minutes Intravenous On call to O.R. 03/31/24 0909 03/31/24 1601   03/31/24 0945  cefTRIAXone (ROCEPHIN) 2 g in sodium chloride  0.9 % 100 mL IVPB  Status:  Discontinued        2 g 200 mL/hr over 30 Minutes  Intravenous On call to O.R. 03/31/24 0854 03/31/24 0909       Assessment/Plan: POD 15: s/p ex lap with LOA and SBR for SBO, Dr. Teresa 11/13   -TPN off and tolerating po -ambulating well -ok to d/c PICC from a surgical standpoint and also ready for discharge from a surgical standpoint once arrangements for care in place  [facility] 04/16/2024

## 2024-04-17 DIAGNOSIS — K56609 Unspecified intestinal obstruction, unspecified as to partial versus complete obstruction: Secondary | ICD-10-CM | POA: Diagnosis not present

## 2024-04-17 NOTE — Plan of Care (Signed)

## 2024-04-17 NOTE — TOC Progression Note (Signed)
 Transition of Care [facility]) - Progression Note    Patient Details  Name: Dominique Mooney MRN: [id number] Date of Birth: [birth year]  Transition [facility] CM/SW Contact  Sonda Manuella Quill, RN Phone Number: 04/17/2024, 9:58 AM  Clinical Narrative:    Beatris reinhold Banker, reception at [facility]; she said Director Of Nursing Alejo will not be back until Monday; LVM fpr Clotilda, awaiting return call for update/ on assessment and bed offer.      Barriers to Discharge: Continued Medical Work up               Expected Discharge Plan and Services                                               Social Drivers of Health (SDOH) Interventions SDOH Screenings   Food Insecurity: No Food Insecurity (03/27/2024)  Housing: High Risk (03/27/2024)  Transportation Needs: No Transportation Needs (03/27/2024)  Utilities: Not At Risk (03/27/2024)  Social Connections: Unknown (03/27/2024)  Tobacco Use: Low Risk  (04/01/2024)    Readmission Risk Interventions    03/30/2024    4:16 PM  Readmission Risk Prevention Plan  Post Dischage Appt Complete  Medication Screening Complete  Transportation Screening Complete

## 2024-04-17 NOTE — Progress Notes (Signed)
 04/17/2024  Dominique Mooney [id number] 08-Dec-1950  CARE TEAM: PCP: Emerick Avelina POUR, PA-C  Outpatient Care Team: Patient Care Team: [name], [name], PA-C as PCP - General (Physician Assistant) [name], MD as PCP - Cardiology (Cardiology)  Inpatient Treatment Team: Treatment Team:  Jillian Buttery, MD [facility], Md, MD Bobbette Pinon, MD Demetrios Poster, RN Arlyss Si DASEN, [place] Jerona Lauraine BROCKS, RN Raynaldo Vina GAILS, RN   Problem List:   Principal Problem:   Small bowel obstruction [facility]) Active Problems:   Hypothyroidism   Hyperlipidemia   Mild persistent asthma   CAD in native artery   Chronic kidney disease, stage 3a (HCC)   Protein-calorie malnutrition, severe   04/01/2024  PRE-OPERATIVE DIAGNOSIS: Small bowel obstruction   POST-OPERATIVE DIAGNOSIS:  Same   PROCEDURE:   1.  Exploratory laparotomy with small bowel resection 2.  Lysis of adhesions x 80 minutes 3.  Enterorrhaphy (repair of small bowel serosa)   SURGEON:  [name], MD   FINDINGS:  Adhesions to abdominal wall related to prior surgery in the low midline.  Additionally, pelvic adhesions containing small bowel related prior surgery.  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  The stricturing segment does appear quite chronic and fairly dense.  Bowel is viable but given the evident stricturing at this location, we opted to resect the segment of small bowel.  1 small deserosalization noted from a dense matted adhesion which was repaired.  This was inherent to the nature of the procedure.  No other significant adhesions noted at conclusion of procedure.  Pathology FINAL MICROSCOPIC DIAGNOSIS:   A. ILEUM, SEGMENT, EXCISION:  - Segment of small intestine with areas of mucosal ischemia and vascular  congestion, clinically small bowel obstruction.  - Reactive serositis.  - Negative for dysplasia and malignancy.     Assessment [facility] Stay = 21 days) 16 Days Post-Op    Recovering    Assessment/Plan POD 16: s/p ex lap with LOA and SBR for SBO, Dr. Pizza 11/13 Pathology benign. -Had postop CT 11/20 that was not concerning. -Afebrile. - Staples removed 11/27  -Hemoglobin stable. -Soluble fiber regimen. - Tolerating solid diet.     - Wean off TPN 11/28 -Mobilize, pulm toilet -Multi-modal pain control   FEN -regular diet being off TPN VTE - Lovenox  ID - Rocephin perioperatively.  No reason to continue beyond that or restart.  -mobilize as tolerated to help recovery.  Enlist therapies in moderate/high risk patients as appropriate  I updated the patient's status to the patient and nurse  Recommendations were made.  Questions were answered.  They expressed understanding & appreciation.  -Disposition: Stable to discharge from surgery standpoint.  With her dementia issues I believe they are trying to transition to memory care or at least assisted living.  Staples removed.  Has postop appointment to see Dr. Pizza.  Surgery will follow peripherally at this point.     I reviewed nursing notes, hospitalist notes, last 24 h vitals and pain scores, last 48 h intake and output, last 24 h labs and trends, and last 24 h imaging results.  I have reviewed this patient's available data, including medical history, events of note, test results, etc as part of my evaluation.   A significant portion of that time was spent in counseling. Care during the described time interval was provided by me.  This care required moderate level of medical decision making.  04/17/2024  Subjective: (Chief complaint)  Sitting up smiling.  Back in home PJs.  Denies pain.  Hoping to go home.  Objective:  Vital signs:  Vitals:   04/16/24 1344 04/16/24 2013 04/17/24 0531 04/17/24 0537  BP: 105/65 120/82 115/72   Pulse: 78 75 67   Resp: 16 18 17    Temp: 98.3 F (36.8 C) 98.5 F (36.9 C) 98.8 F (37.1 C)    TempSrc:  Oral Oral   SpO2: 100% 100% 100%   Weight:    53.9 kg  Height:        Last BM Date : 04/13/24  Intake/Output   Yesterday:  11/28 0701 - 11/29 0700 In: 240 [P.O.:240] Out: -  This shift:  No intake/output data recorded.  Bowel function:  Flatus: YES  BM:  YES  Drain: (No drain)   Physical Exam:  General: Pt awake/alert in no acute distress.  Sitting up smiling in bed.  Moving around without any splinting or guarding. Eyes: PERRL, normal EOM.  Sclera clear.  No icterus Neuro: CN II-XII intact w/o focal sensory/motor deficits. Lymph: No head/neck/groin lymphadenopathy Psych:  No delerium/psychosis/paranoia.  Oriented x 1 HENT: Normocephalic, Mucus membranes moist.  No thrush Neck: Supple, No tracheal deviation.  No obvious thyromegaly Chest: No pain to chest wall compression.  Good respiratory excursion.  No audible wheezing CV:  Pulses intact.  Regular rhythm.  No major extremity edema MS: Normal AROM mjr joints.  No obvious deformity  Abdomen: Soft.  Nondistended.  Nontender.  Low midline incision closed with staples in place.  No evidence of peritonitis.  No incarcerated hernias.  Ext:   No deformity.  No mjr edema.  No cyanosis Skin: No petechiae / purpurea.  No major sores.  Warm and dry    Results:   Cultures: No results found for this or any previous visit (from the past 720 hours).  Labs: No results found for this or any previous visit (from the past 48 hours).   Imaging / Studies: No results found.  Medications / Allergies: per chart  Antibiotics: Anti-infectives (From admission, onward)    Start     Dose/Rate Route Frequency Ordered Stop   04/01/24 0800  cefTRIAXone (ROCEPHIN) 2 g in sodium chloride  0.9 % 100 mL IVPB  Status:  Discontinued        2 g 200 mL/hr over 30 Minutes Intravenous On call to O.R. 04/01/24 0700 04/01/24 0731   04/01/24 0730  cefoTEtan  (CEFOTAN ) 2 g in sodium chloride  0.9 % 100 mL IVPB        2 g 200 mL/hr  over 30 Minutes Intravenous On call to O.R. 04/01/24 0640 04/01/24 1924   03/31/24 1300  cefoTEtan  (CEFOTAN ) 2 g in sodium chloride  0.9 % 100 mL IVPB  Status:  Discontinued        2 g 200 mL/hr over 30 Minutes Intravenous On call to O.R. 03/31/24 0909 03/31/24 1601   03/31/24 0945  cefTRIAXone (ROCEPHIN) 2 g in sodium chloride  0.9 % 100 mL IVPB  Status:  Discontinued        2 g 200 mL/hr over 30 Minutes Intravenous On call to O.R. 03/31/24 0854 03/31/24 0909         Note: Portions of this report may have been transcribed using voice recognition software. Every effort was made to ensure accuracy; however, inadvertent computerized transcription errors may be present.   Any transcriptional errors that result from this process are unintentional.    Elspeth KYM Schultze, MD,  FACS, MASCRS Esophageal, Gastrointestinal & Colorectal Surgery Robotic and Minimally Invasive Surgery  [facility] [address], [address] [place], [place] [address] [phone] Fax [phone] Main  CONTACT INFORMATION: Weekday (9AM-5PM): Call [facility] main office at [phone] Weeknight (5PM-9AM) or Weekend/Holiday: Check EPIC Web Links tab & use AMION (password  [email or web address]) for General Surgery [facility] coverage  Please, DO NOT use SecureChat  (it is not reliable communication to reach operating surgeons & will lead to a delay in care).   Epic staff messaging available for outpatient concerns needing 1-2 business day response.      04/17/2024  8:11 AM

## 2024-04-17 NOTE — Progress Notes (Addendum)
 PROGRESS NOTE  Dominique Mooney  FMW:[id number] DOB: 16-Sep-1950 DOA: 03/27/2024 PCP: Emerick Avelina POUR, PA-C   Brief Narrative: Patient is a 73 year old female with history of nonobstructive coronary artery disease, CKD stage IIIa, cognitive impairment, post ERCP pancreatitis, asthma, hypertension, hyperlipidemia who presented with abdominal pain.  CT abdomen/pelvis showed SBO with 2 adjacent transition points in the right lower quadrant and fecalization of the bowel within the transition point concerning for internal hernia or closed-loop obstruction.  Patient failed conservative management, underwent expiratory laparotomy with lysis of adhesions on 04/01/2024 by Dr. Pizza.  Had prolonged ileus postoperatively and was started on TPN on 04/06/2024.Repeat CT abdomen and pelvis done on 04/08/24 suggested possible ileus or partial SBO, fluid anterior to rectum and in LLQ, with some leukocytosis.  Leukocytosis has resolved.  Started having bowel movements.  Diet upgraded to regular and TPN  has been turned off.  General surgery cleared for dc  .TOC working on discharge to ALF/memory care.  Medically stable for discharge  whenever possible.  Assessment & Plan:  Principal Problem:   Small bowel obstruction (HCC) Active Problems:   Hypothyroidism   Hyperlipidemia   Mild persistent asthma   CAD in native artery   Chronic kidney disease, stage 3a (HCC)   Protein-calorie malnutrition, severe  Small bowel obstruction/prolonged postop ileus: Clinical scenario as above.  Patient started having bowel movements.  Leukocytosis resolved.  Currently on regular diet, stopped TPN  Memory impairment/cognitive impairment: Continue delirium precaution, frequent reorientation.  MRI did not show any acute intracranial abnormalities  Hypothyroidism: History of noncompliance.  Currently on Synthyroid  History of nonobstructive coronary disease: Stable.  No anginal symptoms  Hyperlipidemia: Not on statin due to  drug-induced liver injury  Hypertension: Currently normotensive.  Not taking any medications at home  Hypokalemia/hypophosphatemia: Will continue to monitor and supplement as necessary  Normocytic anemia: Currently hemoglobin stable in the range of 8-9  Mild intermittent asthma: Stable  Hyponatremia: Resolved  Debility/deconditioning: PT recommended home health on discharge.  TOC working with family for discharge to assisted living facility/memory care    Nutrition Problem: Severe Malnutrition Etiology: acute illness (SBO)    DVT prophylaxis:enoxaparin  (LOVENOX ) injection 40 mg Start: 04/06/24 1200 SCDs Start: 03/27/24 2136     Code Status: Full Code  Family Communication: None at bedside  Patient status:Inpatient  Patient is from :Home  Anticipated discharge to: ALF/memory care  Estimated DC date: Whenever possible   Consultants: General surgery  Procedures: Exploratory laparotomy  Antimicrobials:  Anti-infectives (From admission, onward)    Start     Dose/Rate Route Frequency Ordered Stop   04/01/24 0800  cefTRIAXone (ROCEPHIN) 2 g in sodium chloride  0.9 % 100 mL IVPB  Status:  Discontinued        2 g 200 mL/hr over 30 Minutes Intravenous On call to O.R. 04/01/24 0700 04/01/24 0731   04/01/24 0730  cefoTEtan  (CEFOTAN ) 2 g in sodium chloride  0.9 % 100 mL IVPB        2 g 200 mL/hr over 30 Minutes Intravenous On call to O.R. 04/01/24 0640 04/01/24 1924   03/31/24 1300  cefoTEtan  (CEFOTAN ) 2 g in sodium chloride  0.9 % 100 mL IVPB  Status:  Discontinued        2 g 200 mL/hr over 30 Minutes Intravenous On call to O.R. 03/31/24 0909 03/31/24 1601   03/31/24 0945  cefTRIAXone (ROCEPHIN) 2 g in sodium chloride  0.9 % 100 mL IVPB  Status:  Discontinued  2 g 200 mL/hr over 30 Minutes Intravenous On call to O.R. 03/31/24 9145 03/31/24 0909       Subjective: Patient seen and examined at bedside today.  Hemodynamically stable.  Comfortable.  Sitting on the bed.   Tolerating solid diet.  No abdomen pain, nausea or vomiting.  Abdomen is soft and nondistended.  Remains pleasantly confused.  Very eager to be discharged.  Objective: Vitals:   04/16/24 1344 04/16/24 2013 04/17/24 0531 04/17/24 0537  BP: 105/65 120/82 115/72   Pulse: 78 75 67   Resp: 16 18 17    Temp: 98.3 F (36.8 C) 98.5 F (36.9 C) 98.8 F (37.1 C)   TempSrc:  Oral Oral   SpO2: 100% 100% 100%   Weight:    53.9 kg  Height:        Intake/Output Summary (Last 24 hours) at 04/17/2024 1024 Last data filed at 04/17/2024 0537 Gross per 24 hour  Intake 240 ml  Output --  Net 240 ml   Filed Weights   04/12/24 0500 04/14/24 0513 04/17/24 0537  Weight: 53.3 kg 54.2 kg 53.9 kg    Examination:   General exam: Overall comfortable, not in distress, pleasantly confused HEENT: PERRL Respiratory system:  no wheezes or crackles  Cardiovascular system: S1 & S2 heard, RRR.  Gastrointestinal system: Abdomen is nondistended, soft and nontender.  Staples on the lower abdomen Central nervous system: Alert and oriented Extremities: No edema, no clubbing ,no cyanosis Skin: No rashes, no ulcers,no icterus       Data Reviewed: I have personally reviewed following labs and imaging studies  CBC: Recent Labs  Lab 04/11/24 0300 04/12/24 0245 04/13/24 0317 04/14/24 0149 04/15/24 0255  WBC 13.2* 9.9 8.7 7.1 6.0  HGB 8.8* 8.7* 8.6* 7.9* 8.1*  HCT 26.5* 26.5* 26.2* 24.1* 24.5*  MCV 93.3 93.3 93.9 94.5 93.9  PLT 329 356 372 361 419*   Basic Metabolic Panel: Recent Labs  Lab 04/11/24 0300 04/12/24 0245 04/15/24 0255  NA 134* 138 138  K 4.2 4.2 4.0  CL 103 105 104  CO2 25 26 26   GLUCOSE 117* 111* 100*  BUN 17 16 15   CREATININE 0.65 0.68 0.80  CALCIUM  8.4* 8.5* 8.7*  MG 2.0 2.1 2.1  PHOS 3.2 3.3 3.2     No results found for this or any previous visit (from the past 240 hours).   Radiology Studies: No results found.  Scheduled Meds:  Chlorhexidine  Gluconate Cloth  6 each  Topical Daily   enoxaparin  (LOVENOX ) injection  40 mg Subcutaneous Q24H   feeding supplement  1 Container Oral TID BM   levothyroxine   75 mcg Oral Q0600   melatonin  3 mg Oral QHS   multivitamin with minerals  1 tablet Oral Daily   Continuous Infusions:     LOS: 21 days   Ivonne Mustache, MD [facility] P11/29/2025, 10:24 AM

## 2024-04-18 DIAGNOSIS — K56609 Unspecified intestinal obstruction, unspecified as to partial versus complete obstruction: Secondary | ICD-10-CM | POA: Diagnosis not present

## 2024-04-18 NOTE — Progress Notes (Signed)
 PROGRESS NOTE  Dominique Mooney  FMW:[id number] DOB: November 10, 1950 DOA: 03/27/2024 PCP: Emerick Avelina POUR, PA-C   Brief Narrative: Patient is a 73 year old female with history of nonobstructive coronary artery disease, CKD stage IIIa, cognitive impairment, post ERCP pancreatitis, asthma, hypertension, hyperlipidemia who presented with abdominal pain.  CT abdomen/pelvis showed SBO with 2 adjacent transition points in the right lower quadrant and fecalization of the bowel within the transition point concerning for internal hernia or closed-loop obstruction.  Patient failed conservative management, underwent expiratory laparotomy with lysis of adhesions on 04/01/2024 by Dr. Pizza.  Had prolonged ileus postoperatively and was started on TPN on 04/06/2024.Repeat CT abdomen and pelvis done on 04/08/24 suggested possible ileus or partial SBO, fluid anterior to rectum and in LLQ, with some leukocytosis.  Leukocytosis has resolved.  Started having bowel movements.  Diet upgraded to regular and TPN  has been turned off.  General surgery cleared for dc  .TOC working on discharge to ALF/memory care.  Medically stable for discharge  whenever possible.  Assessment & Plan:  Principal Problem:   Small bowel obstruction (HCC) Active Problems:   Hypothyroidism   Hyperlipidemia   Mild persistent asthma   CAD in native artery   Chronic kidney disease, stage 3a (HCC)   Protein-calorie malnutrition, severe  Small bowel obstruction/prolonged postop ileus: Clinical scenario as above.  Patient started having bowel movements.  Leukocytosis resolved.  Currently on regular diet, stopped TPN  Memory impairment/cognitive impairment: Continue delirium precaution, frequent reorientation.  MRI did not show any acute intracranial abnormalities  Hypothyroidism: History of noncompliance.  Currently on Synthyroid  History of nonobstructive coronary disease: Stable.  No anginal symptoms  Hyperlipidemia: Not on statin due to  drug-induced liver injury  Hypertension: Currently normotensive.  Not taking any medications at home  Hypokalemia/hypophosphatemia: Will continue to monitor and supplement as necessary  Normocytic anemia: Currently hemoglobin stable in the range of 8-9  Mild intermittent asthma: Stable  Hyponatremia: Resolved  Debility/deconditioning: PT recommended home health on discharge.  TOC working with family for discharge to assisted living facility/memory care    Nutrition Problem: Severe Malnutrition Etiology: acute illness (SBO)    DVT prophylaxis:enoxaparin  (LOVENOX ) injection 40 mg Start: 04/06/24 1200 SCDs Start: 03/27/24 2136     Code Status: Full Code  Family Communication: None at bedside  Patient status:Inpatient  Patient is from :Home  Anticipated discharge to: ALF/memory care  Estimated DC date: Whenever possible   Consultants: General surgery  Procedures: Exploratory laparotomy  Antimicrobials:  Anti-infectives (From admission, onward)    Start     Dose/Rate Route Frequency Ordered Stop   04/01/24 0800  cefTRIAXone (ROCEPHIN) 2 g in sodium chloride  0.9 % 100 mL IVPB  Status:  Discontinued        2 g 200 mL/hr over 30 Minutes Intravenous On call to O.R. 04/01/24 0700 04/01/24 0731   04/01/24 0730  cefoTEtan  (CEFOTAN ) 2 g in sodium chloride  0.9 % 100 mL IVPB        2 g 200 mL/hr over 30 Minutes Intravenous On call to O.R. 04/01/24 0640 04/01/24 1924   03/31/24 1300  cefoTEtan  (CEFOTAN ) 2 g in sodium chloride  0.9 % 100 mL IVPB  Status:  Discontinued        2 g 200 mL/hr over 30 Minutes Intravenous On call to O.R. 03/31/24 0909 03/31/24 1601   03/31/24 0945  cefTRIAXone (ROCEPHIN) 2 g in sodium chloride  0.9 % 100 mL IVPB  Status:  Discontinued  2 g 200 mL/hr over 30 Minutes Intravenous On call to O.R. 03/31/24 9145 03/31/24 0909       Subjective: Patient seen and examined at bedside today.  Lying on bed.  Comfortable.  Abdomen is soft, nondistended  and nontender.  Denies any abdomen pain today.  Comfortable.  Pleasantly confused  Objective: Vitals:   04/17/24 1225 04/17/24 2044 04/17/24 2045 04/18/24 0534  BP: (!) 172/76 120/86 120/86 103/65  Pulse: (!) 58 70 72 61  Resp: 17 15 15 15   Temp: (!) 97.5 F (36.4 C) 98.4 F (36.9 C) 98.4 F (36.9 C) 98.4 F (36.9 C)  TempSrc: Oral Oral Oral Oral  SpO2: 100% 100% 100% 100%  Weight:      Height:        Intake/Output Summary (Last 24 hours) at 04/18/2024 0931 Last data filed at 04/18/2024 0617 Gross per 24 hour  Intake 270 ml  Output --  Net 270 ml   Filed Weights   04/12/24 0500 04/14/24 0513 04/17/24 0537  Weight: 53.3 kg 54.2 kg 53.9 kg    Examination:  General exam: Overall comfortable, not in distress HEENT: PERRL Respiratory system:  no wheezes or crackles  Cardiovascular system: S1 & S2 heard, RRR.  Gastrointestinal system: Abdomen is nondistended, soft and nontender.  Staples on the lower abdomen from laparotomy Central nervous system: Alert and awake, oriented to place only Extremities: No edema, no clubbing ,no cyanosis Skin: No rashes, no ulcers,no icterus       Data Reviewed: I have personally reviewed following labs and imaging studies  CBC: Recent Labs  Lab 04/12/24 0245 04/13/24 0317 04/14/24 0149 04/15/24 0255  WBC 9.9 8.7 7.1 6.0  HGB 8.7* 8.6* 7.9* 8.1*  HCT 26.5* 26.2* 24.1* 24.5*  MCV 93.3 93.9 94.5 93.9  PLT 356 372 361 419*   Basic Metabolic Panel: Recent Labs  Lab 04/12/24 0245 04/15/24 0255  NA 138 138  K 4.2 4.0  CL 105 104  CO2 26 26  GLUCOSE 111* 100*  BUN 16 15  CREATININE 0.68 0.80  CALCIUM  8.5* 8.7*  MG 2.1 2.1  PHOS 3.3 3.2     No results found for this or any previous visit (from the past 240 hours).   Radiology Studies: No results found.  Scheduled Meds:  Chlorhexidine  Gluconate Cloth  6 each Topical Daily   enoxaparin  (LOVENOX ) injection  40 mg Subcutaneous Q24H   feeding supplement  1 Container Oral  TID BM   levothyroxine   75 mcg Oral Q0600   melatonin  3 mg Oral QHS   multivitamin with minerals  1 tablet Oral Daily   Continuous Infusions:     LOS: 22 days   Ivonne Mustache, MD [facility] P11/30/2025, 9:31 AM

## 2024-04-18 NOTE — Plan of Care (Signed)
  Problem: Health Behavior/Discharge Planning: Goal: Ability to manage health-related needs will improve Outcome: Progressing   Problem: Clinical Measurements: Goal: Ability to maintain clinical measurements within normal limits will improve Outcome: Progressing   Problem: Activity: Goal: Risk for activity intolerance will decrease Outcome: Progressing   Problem: Nutrition: Goal: Adequate nutrition will be maintained Outcome: Progressing   Problem: Safety: Goal: Ability to remain free from injury will improve Outcome: Progressing

## 2024-04-19 DIAGNOSIS — K56609 Unspecified intestinal obstruction, unspecified as to partial versus complete obstruction: Secondary | ICD-10-CM | POA: Diagnosis not present

## 2024-04-19 MED ORDER — POLYETHYLENE GLYCOL 3350 17 G PO PACK
17.0000 g | PACK | Freq: Every day | ORAL | Status: DC
  Administered 2024-04-19 – 2024-04-20 (×2): 17 g via ORAL
  Filled 2024-04-19 (×2): qty 1

## 2024-04-19 MED ORDER — OXYCODONE HCL 5 MG PO TABS
5.0000 mg | ORAL_TABLET | Freq: Four times a day (QID) | ORAL | Status: DC | PRN
  Administered 2024-04-19: 5 mg via ORAL
  Filled 2024-04-19: qty 1

## 2024-04-19 NOTE — Progress Notes (Signed)
 Mobility Specialist - Progress Note   04/19/24 1443  Mobility  Activity Ambulated independently  Level of Assistance Independent  Assistive Device None  Distance Ambulated (ft) 500 ft  Range of Motion/Exercises Active  Activity Response Tolerated well  Mobility visit 1 Mobility  Mobility Specialist Start Time (ACUTE ONLY) 1430  Mobility Specialist Stop Time (ACUTE ONLY) 1440  Mobility Specialist Time Calculation (min) (ACUTE ONLY) 10 min   Pt was found on recliner chair and agreeable to mobilize. No complaints. At EOS returned to recliner chair with all needs met. Call bell in reach.   Erminio Leos,  Mobility Specialist Can be reached via Secure Chat

## 2024-04-19 NOTE — Progress Notes (Signed)
 PROGRESS NOTE  Dominique Mooney  FMW:[id number] DOB: [birth year] DOA: 03/27/2024 PCP: Emerick Avelina POUR, PA-C   Brief Narrative: Patient is a 73 year old female with history of nonobstructive coronary artery disease, CKD stage IIIa, cognitive impairment, post ERCP pancreatitis, asthma, hypertension, hyperlipidemia who presented with abdominal pain.  CT abdomen/pelvis showed SBO with 2 adjacent transition points in the right lower quadrant and fecalization of the bowel within the transition point concerning for internal hernia or closed-loop obstruction.  Patient failed conservative management, underwent expiratory laparotomy with lysis of adhesions on 04/01/2024 by Dr. Pizza.  Had prolonged ileus postoperatively and was started on TPN on 04/06/2024.Repeat CT abdomen and pelvis done on 04/08/24 suggested possible ileus or partial SBO, fluid anterior to rectum and in LLQ, with some leukocytosis.  Leukocytosis has resolved.  Started having bowel movements.  Diet upgraded to regular and TPN  has been turned off.  General surgery cleared for dc  .TOC working on discharge to ALF/memory care.  Medically stable for discharge  whenever possible.  Assessment & Plan:  Principal Problem:   Small bowel obstruction (HCC) Active Problems:   Hypothyroidism   Hyperlipidemia   Mild persistent asthma   CAD in native artery   Chronic kidney disease, stage 3a (HCC)   Protein-calorie malnutrition, severe  Small bowel obstruction/prolonged postop ileus: Clinical scenario as above.  Patient started having bowel movements.  Leukocytosis resolved.  Currently on regular diet, stopped TPN  Memory impairment/cognitive impairment: Continue delirium precaution, frequent reorientation.  MRI did not show any acute intracranial abnormalities  Hypothyroidism: History of noncompliance.  Currently on Synthyroid  History of nonobstructive coronary disease: Stable.  No anginal symptoms  Hyperlipidemia: Not on statin due to  drug-induced liver injury  Hypertension: Currently normotensive.  Not taking any medications at home  Hypokalemia/hypophosphatemia: Will continue to monitor and supplement as necessary  Normocytic anemia: Currently hemoglobin stable in the range of 8-9  Mild intermittent asthma: Stable  Hyponatremia: Resolved  Debility/deconditioning: PT recommended home health on discharge.  TOC working with family for discharge to assisted living facility/memory care    Nutrition Problem: Severe Malnutrition Etiology: acute illness (SBO)    DVT prophylaxis:enoxaparin  (LOVENOX ) injection 40 mg Start: 04/06/24 1200 SCDs Start: 03/27/24 2136     Code Status: Full Code  Family Communication: None at bedside  Patient status:Inpatient  Patient is from :Home  Anticipated discharge to: ALF/memory care  Estimated DC date: Whenever possible   Consultants: General surgery  Procedures: Exploratory laparotomy  Antimicrobials:  Anti-infectives (From admission, onward)    Start     Dose/Rate Route Frequency Ordered Stop   04/01/24 0800  cefTRIAXone (ROCEPHIN) 2 g in sodium chloride  0.9 % 100 mL IVPB  Status:  Discontinued        2 g 200 mL/hr over 30 Minutes Intravenous On call to O.R. 04/01/24 0700 04/01/24 0731   04/01/24 0730  cefoTEtan  (CEFOTAN ) 2 g in sodium chloride  0.9 % 100 mL IVPB        2 g 200 mL/hr over 30 Minutes Intravenous On call to O.R. 04/01/24 0640 04/01/24 1924   03/31/24 1300  cefoTEtan  (CEFOTAN ) 2 g in sodium chloride  0.9 % 100 mL IVPB  Status:  Discontinued        2 g 200 mL/hr over 30 Minutes Intravenous On call to O.R. 03/31/24 0909 03/31/24 1601   03/31/24 0945  cefTRIAXone (ROCEPHIN) 2 g in sodium chloride  0.9 % 100 mL IVPB  Status:  Discontinued  2 g 200 mL/hr over 30 Minutes Intravenous On call to O.R. 03/31/24 9145 03/31/24 0909       Subjective: Patient seen and examined at bedside today.  Sitting on the chair.  Pleasantly confused.  Eating her  breakfast.  Denies any abdominal pain, nausea or vomiting today.  Abdomen soft, nondistended and  has good bowel sounds  Objective: Vitals:   04/18/24 1501 04/18/24 1816 04/18/24 2126 04/19/24 0547  BP: 104/69 96/64 112/74 104/75  Pulse: 69 72 68 (!) 57  Resp: 16 16 16 16   Temp: 98.7 F (37.1 C) 98.4 F (36.9 C) 98.1 F (36.7 C) 98.3 F (36.8 C)  TempSrc: Oral     SpO2: 100% 100% 99% 100%  Weight:      Height:        Intake/Output Summary (Last 24 hours) at 04/19/2024 1107 Last data filed at 04/19/2024 1000 Gross per 24 hour  Intake 780 ml  Output 0 ml  Net 780 ml   Filed Weights   04/12/24 0500 04/14/24 0513 04/17/24 0537  Weight: 53.3 kg 54.2 kg 53.9 kg    Examination:  General exam: Overall comfortable, not in distress, pleasantly confused HEENT: PERRL Respiratory system:  no wheezes or crackles  Cardiovascular system: S1 & S2 heard, RRR.  Gastrointestinal system: Abdomen is nondistended, soft and nontender.  Staples on the lower abdomen from recent laparotomy Central nervous system: Alert and oriented to place only Extremities: No edema, no clubbing ,no cyanosis Skin: No rashes, no ulcers,no icterus         Data Reviewed: I have personally reviewed following labs and imaging studies  CBC: Recent Labs  Lab 04/13/24 0317 04/14/24 0149 04/15/24 0255  WBC 8.7 7.1 6.0  HGB 8.6* 7.9* 8.1*  HCT 26.2* 24.1* 24.5*  MCV 93.9 94.5 93.9  PLT 372 361 419*   Basic Metabolic Panel: Recent Labs  Lab 04/15/24 0255  NA 138  K 4.0  CL 104  CO2 26  GLUCOSE 100*  BUN 15  CREATININE 0.80  CALCIUM  8.7*  MG 2.1  PHOS 3.2     No results found for this or any previous visit (from the past 240 hours).   Radiology Studies: No results found.  Scheduled Meds:  Chlorhexidine  Gluconate Cloth  6 each Topical Daily   enoxaparin  (LOVENOX ) injection  40 mg Subcutaneous Q24H   feeding supplement  1 Container Oral TID BM   levothyroxine   75 mcg Oral Q0600   melatonin   3 mg Oral QHS   multivitamin with minerals  1 tablet Oral Daily   polyethylene glycol  17 g Oral Daily   Continuous Infusions:     LOS: 23 days   Ivonne Mustache, MD [facility] P12/05/2023, 11:07 AM

## 2024-04-19 NOTE — TOC Progression Note (Signed)
 Transition of Care [facility]) - Progression Note    Patient Details  Name: Dominique Mooney MRN: [id number] Date of Birth: Oct 10, 1950  [facility]) CM/SW Contact  Jon ONEIDA Anon, RN Phone Number: 04/19/2024, 4:21 PM  Clinical Narrative:    RNCM spoke with pt daughter [name] via telephone and she states Fredick Boor coming to visit pt today. Denita Hicks [phone] rep from [facility]. Also requesting to fax FL2 to [facility]. RNCM spoke with Greg/ Tayvion at [phone]- [phone]. He requested FL2 be faxed to [phone]. IP CM will continue to follow for DC planning needs.   Expected Discharge Plan: Memory Care Barriers to Discharge: Other (must enter comment) (Working to find Memory care/ ALF placement)               Expected Discharge Plan and Services                                               Social Drivers of Health (SDOH) Interventions SDOH Screenings   Food Insecurity: No Food Insecurity (03/27/2024)  Housing: High Risk (03/27/2024)  Transportation Needs: No Transportation Needs (03/27/2024)  Utilities: Not At Risk (03/27/2024)  Social Connections: Unknown (03/27/2024)  Tobacco Use: Low Risk  (04/01/2024)    Readmission Risk Interventions    03/30/2024    4:16 PM  Readmission Risk Prevention Plan  Post Dischage Appt Complete  Medication Screening Complete  Transportation Screening Complete

## 2024-04-19 NOTE — Plan of Care (Signed)

## 2024-04-19 NOTE — Progress Notes (Signed)
 Nutrition Follow-up  DOCUMENTATION CODES:   Severe malnutrition in context of acute illness/injury  INTERVENTION:   -Boost Breeze po TID, each supplement provides 250 kcal and 9 grams of protein   NUTRITION DIAGNOSIS:   Severe Malnutrition related to acute illness (SBO) as evidenced by severe muscle depletion, moderate fat depletion, energy intake < 75% for > 7 days.  Ongoing.  GOAL:   Patient will meet greater than or equal to 90% of their needs  Progressing  MONITOR:   PO intake, Supplement acceptance  ASSESSMENT:   73 year old F with PMH of nonobstructive CAD, CKD-3A, memory loss,  DILI due to statin, post ERCP pancreatitis in 2000, asthma, HTN and HLD presented to ED with nausea, vomiting and abdominal pain, and admitted with small bowel obstruction.  CT abdomen and pelvis showed SBO with 2 adjacent transition points in RLQ and fecalization of the bowel between the transition point concerning for internal hernia or closed-loop obstruction.She underwent ex lap with LOA on 11/13 by Dr. Teresa.  11/8: admitted, NPO 11/9: NGT placed 11/13: s/p ex lap, LOA 11/17: TPN initiated 11/23: NGT removed 11/24: CLD 11/26: Soft diet 11/27: Regular diet, TPN weaned off  Patient currently consuming 25-50% of meals. Accepting Boost Breeze at least once daily.  Admission weight: 115 lbs Current weight: 118 lbs  Medications: Multivitamin with minerals daily, Miralax  Labs reviewed.   Diet Order:   Diet Order             Diet regular Room service appropriate? Yes; Fluid consistency: Thin  Diet effective now                   EDUCATION NEEDS:   Education needs have been addressed  Skin:  Skin Assessment: Skin Integrity Issues: Skin Integrity Issues:: Incisions Incisions: 11/13 abdomen  Last BM:  12/1  Height:   Ht Readings from Last 1 Encounters:  03/31/24 5' 1 (1.549 m)    Weight:   Wt Readings from Last 1 Encounters:  04/17/24 53.9 kg    BMI:  Body  mass index is 22.45 kg/m.  Estimated Nutritional Needs:   Kcal:  1550-1750  Protein:  65-80g  Fluid:  1.7L/day  Morna Lee, MS, RD, LDN Inpatient Clinical Dietitian Contact via Secure chat

## 2024-04-19 NOTE — NC FL2 (Signed)
 [place]  MEDICAID [id number] LEVEL OF CARE FORM     IDENTIFICATION  Patient Name: Dominique Mooney Birthdate: [birth year] Sex: female Admission Date (Current Location): 03/27/2024  [facility] and [place] Number:  Producer, Television/film/video and Address:  [facility],  [address] [place], [place] [address]      Provider Number: [id number]  Attending Physician Name and Address:  Jillian Buttery, MD  Relative Name and Phone Number:  THEDORA CAN (Daughter)  [phone]    Current Level of Care: Hospital Recommended Level of Care: Memory Care Prior Approval Number:    Date Approved/Denied:   PASRR Number:    Discharge Plan: Other (Comment) (Memory Care/ ALF)    Current Diagnoses: Patient Active Problem List   Diagnosis Date Noted   Protein-calorie malnutrition, severe 04/05/2024   Small bowel obstruction (HCC) 03/27/2024   Chronic kidney disease, stage 3a (HCC) 03/27/2024   Abdominal gas pain 08/10/2022   Abdominal cramping 08/10/2022   Pain of upper abdomen 08/10/2022   Autoimmune hepatitis (HCC) 09/07/2021   Chronic kidney disease, stage 2 (mild) 09/07/2021   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   Hyperbilirubinemia 09/07/2021   Obstruction of bile duct (HCC) 09/07/2021   Adverse effect of statin 11/17/2020   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   Pain of left lower leg 07/26/2019   Osteoarthritis of left knee 07/16/2019   Pain in left knee 01/29/2018   Coronary artery calcification seen on CT scan 06/26/2017   CAD in native artery 06/26/2017   Cough 11/26/2016   Chest pain of uncertain etiology 11/26/2016   Acute sinusitis 11/26/2016   Abnormal mammogram 10/19/2012   Mastodynia 06/01/2012   ESSENTIAL HYPERTENSION, BENIGN 05/28/2010   Mild persistent asthma 08/02/2008   Allergic rhinitis 01/05/2008   GERD 10/09/2007   CONSTIPATION 10/09/2007   IRRITABLE BOWEL SYNDROME 10/09/2007   Hypothyroidism 10/07/2007   Hyperlipidemia 10/07/2007   PANCREATITIS 10/07/2007     Orientation RESPIRATION BLADDER Height & Weight     Self, Place  Normal Continent Weight: 53.9 kg Height:  5' 1 (154.9 cm)  BEHAVIORAL SYMPTOMS/MOOD NEUROLOGICAL BOWEL NUTRITION STATUS      Continent Diet (Regular Diet, Thin Liquids)  AMBULATORY STATUS COMMUNICATION OF NEEDS Skin   Limited Assist Verbally Surgical wounds (Surgical incision- Abdomen)                       Personal Care Assistance Level of Assistance  Bathing, Feeding, Dressing Bathing Assistance: Limited assistance Feeding assistance: Limited assistance Dressing Assistance: Limited assistance     Functional Limitations Info  Hearing, Sight, Speech Sight Info: Impaired (Wears eyeglasses) Hearing Info: Adequate Speech Info: Adequate    SPECIAL CARE FACTORS FREQUENCY                       Contractures Contractures Info: Not present    Additional Factors Info  Code Status, Allergies Code Status Info: FULL Allergies Info: Statins, Penicillins, Atorvastatin            Current Medications (04/19/2024):  This is the current hospital active medication list Current Facility-Administered Medications  Medication Dose Route Frequency Provider Last Rate Last Admin   albuterol  (PROVENTIL ) (2.5 MG/3ML) 0.083% nebulizer solution 2.5 mg  2.5 mg Nebulization Q6H PRN Pizza Lonni HERO, MD       Chlorhexidine  Gluconate Cloth 2 % PADS 6 each  6 each Topical Daily Gonfa, Taye T, MD   6 each at 04/16/24 0817   enoxaparin  (LOVENOX ) injection 40  mg  40 mg Subcutaneous Q24H Tammy Sor, PA-C   40 mg at 04/18/24 1201   feeding supplement (BOOST / RESOURCE BREEZE) liquid 1 Container  1 Container Oral TID BM Jillian Buttery, MD   237 mL at 04/19/24 0954   hydrALAZINE  (APRESOLINE ) injection 10 mg  10 mg Intravenous Q4H PRN Teresa Lonni HERO, MD       HYDROmorphone  (DILAUDID ) injection 0.5 mg  0.5 mg Intravenous Q3H PRN Teresa Lonni HERO, MD   0.5 mg at 04/06/24 0418   levothyroxine  (SYNTHROID ) tablet 75  mcg  75 mcg Oral Q0600 Jillian Buttery, MD   75 mcg at 04/19/24 0615   melatonin tablet 3 mg  3 mg Oral QHS Gonfa, Taye T, MD   3 mg at 04/18/24 2109   menthol  (CEPACOL) lozenge 3 mg  1 lozenge Oral PRN Gonfa, Taye T, MD       methocarbamol  (ROBAXIN ) injection 500 mg  500 mg Intravenous Q8H PRN Tammy Sor, PA-C   500 mg at 04/06/24 0418   multivitamin with minerals tablet 1 tablet  1 tablet Oral Daily Carolee Rosaline DASEN, RPH   1 tablet at 04/19/24 0955   ondansetron  (ZOFRAN ) injection 4 mg  4 mg Intravenous Q6H PRN Teresa Lonni HERO, MD   4 mg at 04/09/24 1125   phenol (CHLORASEPTIC) mouth spray 1 spray  1 spray Mouth/Throat PRN Teresa Lonni HERO, MD   1 spray at 03/29/24 0405   polyethylene glycol (MIRALAX / GLYCOLAX) packet 17 g  17 g Oral Daily Jillian Buttery, MD   17 g at 04/19/24 9044   sodium chloride  flush (NS) 0.9 % injection 10-40 mL  10-40 mL Intracatheter PRN Gonfa, Taye T, MD         Discharge Medications: Please see discharge summary for a list of discharge medications.  Relevant Imaging Results:  Relevant Lab Results:   Additional Information SSN: [id number]  Jon DASEN Anon, RN

## 2024-04-20 ENCOUNTER — Other Ambulatory Visit (HOSPITAL_COMMUNITY): Payer: Self-pay

## 2024-04-20 LAB — CBC
HCT: 26.8 % — ABNORMAL LOW (ref 36.0–46.0)
Hemoglobin: 9 g/dL — ABNORMAL LOW (ref 12.0–15.0)
MCH: 31.3 pg (ref 26.0–34.0)
MCHC: 33.6 g/dL (ref 30.0–36.0)
MCV: 93.1 fL (ref 80.0–100.0)
Platelets: 373 K/uL (ref 150–400)
RBC: 2.88 MIL/uL — ABNORMAL LOW (ref 3.87–5.11)
RDW: 12.8 % (ref 11.5–15.5)
WBC: 4.9 K/uL (ref 4.0–10.5)
nRBC: 0 % (ref 0.0–0.2)

## 2024-04-20 LAB — BASIC METABOLIC PANEL WITH GFR
Anion gap: 8 (ref 5–15)
BUN: 11 mg/dL (ref 8–23)
CO2: 28 mmol/L (ref 22–32)
Calcium: 9.1 mg/dL (ref 8.9–10.3)
Chloride: 104 mmol/L (ref 98–111)
Creatinine, Ser: 0.77 mg/dL (ref 0.44–1.00)
GFR, Estimated: >60 mL/min (ref >60)
Glucose, Bld: 81 mg/dL (ref 70–99)
Potassium: 3.7 mmol/L (ref 3.5–5.1)
Sodium: 141 mmol/L (ref 135–145)

## 2024-04-20 MED ORDER — OXYCODONE HCL 5 MG PO TABS: 5.0000 mg | ORAL_TABLET | Freq: Four times a day (QID) | ORAL | 0 refills | Status: DC | PRN

## 2024-04-20 MED ORDER — POLYETHYLENE GLYCOL 3350 17 GM/SCOOP PO POWD
17.0000 g | Freq: Every day | ORAL | 0 refills | Status: AC
  Filled 2024-04-20: qty 238, 14d supply, fill #0

## 2024-04-20 MED ORDER — OXYCODONE HCL 5 MG PO TABS
5.0000 mg | ORAL_TABLET | Freq: Four times a day (QID) | ORAL | 0 refills | Status: AC | PRN
  Filled 2024-04-20: qty 15, 4d supply, fill #0

## 2024-04-20 NOTE — Progress Notes (Signed)
 Discharge instructions given to patient's daughter and all questions were answered.

## 2024-04-20 NOTE — Discharge Summary (Signed)
 Physician Discharge Summary  Dominique Mooney FMW:[id number] DOB: June 19, 1950 DOA: 03/27/2024  PCP: Emerick Avelina POUR, PA-C  Admit date: 03/27/2024 Discharge date: 04/20/2024  Admitted From: Home Disposition:  Home  Discharge Condition:Stable CODE STATUS:FULL Diet recommendation:  Regular   Brief/Interim Summary: Patient is a 73 year old female with history of nonobstructive coronary artery disease, CKD stage IIIa, cognitive impairment, post ERCP pancreatitis, asthma, hypertension, hyperlipidemia who presented with abdominal pain.  CT abdomen/pelvis showed SBO with 2 adjacent transition points in the right lower quadrant and fecalization of the bowel within the transition point concerning for internal hernia or closed-loop obstruction.  Patient failed conservative management, underwent expiratory laparotomy with lysis of adhesions on 04/01/2024 by Dr. Pizza.  Had prolonged ileus postoperatively and was started on TPN on 04/06/2024.Repeat CT abdomen and pelvis done on 04/08/24 suggested possible ileus or partial SBO, fluid anterior to rectum and in LLQ, with some leukocytosis.  Leukocytosis has resolved.  Started having bowel movements.  Diet upgraded to regular and TPN  has been turned off.  General surgery cleared for dc  .TOC was working on discharge to ALF/memory care.  Now the plan is to be discharge to home with daughter and she will work on placing her in the memory care.  Medically stable for discharge.  She has an appointment with general surgery on 12/15  Following problems were addressed during the hospitalization:  Small bowel obstruction/prolonged postop ileus: Clinical scenario as above.  Patient started having bowel movements.  Leukocytosis resolved.  Currently on regular diet, stopped TPN.  Follow-up with general surgery on 12/15 for removal of staples   Memory impairment/cognitive impairment: Continue delirium precaution, frequent reorientation.  MRI did not show any acute intracranial  abnormalities   Hypothyroidism: History of noncompliance.  Currently on Synthyroid   History of nonobstructive coronary disease: Stable.  No anginal symptoms   Hyperlipidemia: Not on statin due to drug-induced liver injury   Hypertension: Currently normotensive.    Normocytic anemia: Currently hemoglobin stable in the range of 8-9   Mild intermittent asthma: Stable   Hyponatremia: Resolved   Debility/deconditioning: PT recommended home health on discharge.  TOC was working with family for discharge to assisted living facility/memory care. Now the plan is to be discharge to home with daughter and she will work on placing her in the memory care.  Medically stable for discharge.      Discharge Diagnoses:  Principal Problem:   Small bowel obstruction (HCC) Active Problems:   Hypothyroidism   Hyperlipidemia   Mild persistent asthma   CAD in native artery   Chronic kidney disease, stage 3a (HCC)   Protein-calorie malnutrition, severe    Discharge Instructions  Discharge Instructions     Diet general   Complete by: As directed    Discharge instructions   Complete by: As directed    1)Please follow up with general surgery as an outpatient.  You have an appointment on 05/03/2024   Increase activity slowly   Complete by: As directed    No wound care   Complete by: As directed       Allergies as of 04/20/2024       Reactions   Statins Other (See Comments)   Elevated Liver functions   Penicillins Rash   Atorvastatin  Other (See Comments)   Abdominal bloating        Medication List     STOP taking these medications    metoprolol  succinate 25 MG 24 hr tablet Commonly known as: TOPROL -XL  rosuvastatin  5 MG tablet Commonly known as: CRESTOR        TAKE these medications    albuterol  108 (90 Base) MCG/ACT inhaler Commonly known as: Proventil  HFA INHALE 2 PUFFS INTO THE LUNGS EVERY 6 (SIX) HOURS AS NEEDED. What changed:  how much to take how to take  this when to take this reasons to take this additional instructions   budesonide -formoterol  80-4.5 MCG/ACT inhaler Commonly known as: Symbicort  Inhale 2 puffs into the lungs 2 (two) times daily.   dicyclomine  10 MG capsule Commonly known as: BENTYL  TAKE 1 CAPSULE (10 MG TOTAL) BY MOUTH 2 (TWO) TIMES DAILY AS NEEDED FOR SPASMS (ABDOMINAL PAIN).   donepezil  5 MG tablet Commonly known as: ARICEPT  Take one tablet 5mg  daily. What changed:  how much to take how to take this when to take this additional instructions   esomeprazole  40 MG capsule Commonly known as: NEXIUM  TAKE 1 CAPSULE (40 MG TOTAL) BY MOUTH 2 (TWO) TIMES DAILY BEFORE A MEAL.   fluticasone  50 MCG/ACT nasal spray Commonly known as: FLONASE  Place 2 sprays into both nostrils daily.   levothyroxine  75 MCG tablet Commonly known as: SYNTHROID  Take 75 mcg by mouth daily before breakfast.   multivitamin tablet Take 1 tablet by mouth 3 (three) times a week.   nitroGLYCERIN  0.4 MG SL tablet Commonly known as: NITROSTAT  Place 1 tablet (0.4 mg total) under the tongue every 5 (five) minutes as needed for chest pain.   oxyCODONE  5 MG immediate release tablet Commonly known as: Oxy IR/ROXICODONE  Take 1 tablet (5 mg total) by mouth every 6 (six) hours as needed for moderate pain (pain score 4-6) or severe pain (pain score 7-10).   polyethylene glycol 17 g packet Commonly known as: MIRALAX / GLYCOLAX Take 17 g by mouth daily. Start taking on: April 21, 2024   Praluent  150 MG/ML Soaj Generic drug: Alirocumab  Inject 1 pen  into the skin every 14 (fourteen) days.        Contact information for follow-up providers     Teresa Lonni HERO, MD. Go on 05/03/2024.   Specialties: General Surgery, Colon and Rectal Surgery Why: At 10:00AM,, Arrive 30 mins prior to scheduled appointment time, Please bring your insurance cards and ID Contact information: [address] [place]  [address] [phone]              Contact information for after-discharge care     Home Medical Care     [facility] .   Service: Home Health Services Contact information: [address] [place]  [phone]                    Allergies  Allergen Reactions   Statins Other (See Comments)    Elevated Liver functions   Penicillins Rash   Atorvastatin  Other (See Comments)    Abdominal bloating    Consultations: General Surgery   Procedures/Studies: CT ABDOMEN PELVIS W CONTRAST Result Date: 04/08/2024 EXAM: CT ABDOMEN AND PELVIS WITH CONTRAST 04/08/2024 10:35:18 AM TECHNIQUE: CT of the abdomen and pelvis was performed with the administration of 100 mL of iohexol  (OMNIPAQUE ) 300 MG/ML solution. Multiplanar reformatted images are provided for review. Automated exposure control, iterative reconstruction, and/or weight-based adjustment of the mA/kV was utilized to reduce the radiation dose to as low as reasonably achievable. COMPARISON: 03/27/2024 CLINICAL HISTORY: Bowel obstruction suspected. FINDINGS: LOWER CHEST: Mild subpleural dependent changes within the posterior right base. Small pericardial effusion.  Mild cardiac enlargement. Coronary artery calcifications. LIVER: No suspicious liver lesion. GALLBLADDER AND BILE DUCTS: Status post cholecystectomy. No signs of bile duct dilatation. SPLEEN: The spleen is unremarkable. PANCREAS: Normal appearance of the pancreas. ADRENAL GLANDS: No acute abnormality. KIDNEYS, URETERS AND BLADDER: No stones in the kidneys or ureters. No hydronephrosis. No perinephric or periureteral stranding. Urinary bladder is unremarkable. GI AND BOWEL: Enteric contrast material is identified within the distended stomach. An NG tube is in place, which appears looped within the body of the stomach with the tip in the gastric fundus. Enteric contrast material passes from the stomach into the small bowel  loops up to the level of the splenic flexure. Mild generalized small bowel wall edema is noted throughout the left hemiabdomen, lower abdomen, and pelvis. Mild diffuse small bowel dilatation noted which measures up to 2.6 cm in diameter with a few scattered air-fluid levels. Small bowel anastomosis noted within the right lower quadrant of the abdomen. Signs of previous small bowel resection with anastomosis identified in the right hemiabdomen. Enteric contrast material is identified distal to the anastomosis within the colon. No signs of extravasation of the enteric contrast material to suggest anastomosis dehiscence. There is decreased caliber of the terminal ileum, which contains contrast material up to the ileocecal valve. PERITONEUM AND RETROPERITONEUM: A small volume of pneumoperitoneum. Within the left lower quadrant of the abdomen along the undersurface of the ventral abdominal wall there is a focal collection of complex fluid and gas which measures 4.5 x 2.7 cm and 69 HU, image 662. This is favored to represent a postoperative hematoma. Within the posterior pelvis, there is a fluid collection anterior to the rectum with peripheral mural enhancement measuring 4.8 x 1.8 x 3.2 cm, image 69/2. No additional focal fluid collections identified. VASCULATURE: Aorta is normal in caliber. Aortic atherosclerosis. LYMPH NODES: No signs of abdominopelvic adenopathy. REPRODUCTIVE ORGANS: Status post hysterectomy. No adnexal mass identified. BONES AND SOFT TISSUES: Mild diffuse bony marrow edema. No acute or suspicious osseous findings. Postoperative changes with midline skin staples identified along the ventral abdominal wall. IMPRESSION: 1. Mild diffuse small bowel dilatation with air-fluid levels and mild generalized small bowel wall edema. Transition to distal small bowel noted at the level of the anastomosis in the right hemipelvis with normal caliber terminal ileum. Enteric contrast material, however, is noted  throughout the small bowel, beyond the anastomosis and into the colon which suggest the small bowel dilatation is due to either a postoperative ileus or partial obstruction at the level of the anastomosis. 2. Fluid collection in the posterior pelvis anterior to the rectum with peripheral mural enhancement. Cannot exclude underlying abscess. 3. Small volume pneumoperitoneum, likely postoperative . 4. Focal collection of complex fluid and gas in the left lower quadrant, favored postoperative hematoma. Electronically signed by: Waddell Calk MD 04/08/2024 03:11 PM EST RP Workstation: [id number]   DG Abd Portable 1V Result Date: 04/06/2024 EXAM: 1 VIEW XRAY OF THE ABDOMEN 04/06/2024 10:33:00 AM COMPARISON: 04/01/2024 CLINICAL HISTORY: Ileus (HCC) 01250 FINDINGS: LINES, TUBES AND DEVICES: Enteric tube in place, terminating within the gastric body. BOWEL: Reduced small bowel dilatation with only a mildly dilated left upper quadrant small bowel loop currently identified. Small amount of contrast in the proximal colon. SOFT TISSUES: Cholecystectomy clips in right upper quadrant noted. Midline surgical staples noted. No opaque urinary calculi. BONES: No acute osseous abnormality. LUNGS: Suspected subsegmental atelectasis in the lung bases. IMPRESSION: 1. Reduced small bowel dilatation with only a mildly dilated left upper quadrant small  bowel loop currently identified. 2. Small amount of contrast in the proximal colon. 3. Suspected subsegmental atelectasis in the lung bases. Electronically signed by: Ryan Salvage MD 04/06/2024 02:30 PM EST RP Workstation: [id number]   US  EKG SITE RITE Result Date: 04/05/2024 If Site Rite image not attached, placement could not be confirmed due to current cardiac rhythm.  MR BRAIN WO CONTRAST Result Date: 04/04/2024 EXAM: MRI BRAIN WITHOUT CONTRAST 04/04/2024 02:15:58 PM TECHNIQUE: Multiplanar multisequence MRI of the head/brain was performed without the administration of  intravenous contrast. COMPARISON: CT head 08/17/2021 CLINICAL HISTORY: Dementia, vascular etiology suspected; MRI evaluation for dementia. FINDINGS: BRAIN AND VENTRICLES: No acute infarct. No intracranial hemorrhage. No mass. No midline shift. No hydrocephalus. The sella is unremarkable. Normal flow voids. Mild T2 hyperintensities in the Gjerde matter are compatible with mild chronic microvascular ischemic change. ORBITS: No acute abnormality. SINUSES AND MASTOIDS: No acute abnormality. BONES AND SOFT TISSUES: Normal marrow signal. No acute soft tissue abnormality. IMPRESSION: 1. No acute intracranial abnormality or reversible cause of memory loss . Electronically signed by: Gilmore Molt MD 04/04/2024 03:33 PM EST RP Workstation: [id number]   DG Abd 1 View Result Date: 04/01/2024 EXAM: 1 VIEW XRAY OF THE ABDOMEN 04/01/2024 07:03:00 AM COMPARISON: KUB dated 03/30/2024. CLINICAL HISTORY: [id number] Nasogastric tube present [id number] Nasogastric tube present FINDINGS: LINES, TUBES AND DEVICES: An enteric catheter is again demonstrated within the body of the stomach. BOWEL: Persistent mild-to-moderate distention of the proximal to mid small bowel. There is no definite air evident within the colon. The findings are again suggestive of small bowel obstruction. SOFT TISSUES: Surgical clips are noted within the gallbladder fossa. No opaque urinary calculi. BONES: No acute osseous abnormality. IMPRESSION: 1. Persistent mild-to-moderate distention of the proximal to mid small bowel, suggestive of small bowel obstruction. No definite air evident within the colon. Electronically signed by: Evalene Coho MD 04/01/2024 07:09 AM EST RP Workstation: [id number]   DG Abd Portable 1V Result Date: 03/30/2024 CLINICAL DATA:  Small bowel obstruction. EXAM: PORTABLE ABDOMEN - 1 VIEW COMPARISON:  03/29/2024 FINDINGS: Gaseous distention of small bowel persists measuring up to 3.6 cm diameter. In situ tip is noted in the stomach, as  before. IMPRESSION: Persistent gaseous distention of small bowel compatible with small bowel obstruction. No substantial change. Electronically Signed   By: Camellia Candle M.D.   On: 03/30/2024 07:57   DG Abd Portable 1V Result Date: 03/29/2024 CLINICAL DATA:  Follow-up small bowel obstruction. EXAM: PORTABLE ABDOMEN - 1 VIEW COMPARISON:  03/28/2024 FINDINGS: No significant change in mildly dilated small bowel loops in the left mid abdomen. Mild progression of contrast into the distal small bowel in the pelvis. Those loops are borderline dilated. No contrast in the colon. Persistent contrast in the gastric fundus. Nasogastric tube coiled in the fundus with its tip near the gastroesophageal junction and side hole in the gastric cardia. Cholecystectomy clips. Unremarkable bones. IMPRESSION: 1. No significant change in mildly dilated small bowel loops in the left mid abdomen. 2. Mild progression of contrast into the distal small bowel in the pelvis. 3. No passage of contrast into the colon. This could be due to partial distal small bowel obstruction or ileus. Electronically Signed   By: Elspeth Bathe M.D.   On: 03/29/2024 12:21   DG Abd Portable 1V-Small Bowel Obstruction Protocol-initial, 8 hr delay Result Date: 03/28/2024 EXAM: UPRIGHT AND SUPINE XRAY VIEWS OF THE ABDOMEN, 2 VIEW(S) 03/28/2024 08:31:53 PM COMPARISON: CT from the previous day. CLINICAL HISTORY: 8-hour small bowel follow-up.  FINDINGS: BOWEL: Gastric catheter is again seen in the stomach. Previously administered contrast lies entirely throughout the mildly dilated small bowel. No colonic contrast is noted at this time. 24-hour follow-up is recommended. PERITONEUM AND SOFT TISSUES: No free air. BONES: No acute osseous abnormality. IMPRESSION: 1. Mildly dilated small bowel with intraluminal contrast and no colonic contrast, compatible with ongoing small-bowel transit delay or obstruction. 2. Recommend 24-hour follow-up radiograph to assess  progression of contrast into the colon. Electronically signed by: Oneil Devonshire MD 03/28/2024 08:35 PM EST RP Workstation: [id number] Portable 1V Result Date: 03/28/2024 CLINICAL DATA:  Nasogastric tube placement. EXAM: PORTABLE ABDOMEN - 1 VIEW COMPARISON:  CT yesterday FINDINGS: Tip and side port of the enteric tube below the diaphragm in the stomach. Dilated loops of small bowel in the upper abdomen. Cholecystectomy clips. IMPRESSION: Tip and side port of the enteric tube below the diaphragm in the stomach. Electronically Signed   By: Andrea Gasman M.D.   On: 03/28/2024 11:48   CT ABDOMEN PELVIS W CONTRAST Result Date: 03/27/2024 EXAM: CT ABDOMEN AND PELVIS WITH CONTRAST 03/27/2024 07:46:22 PM TECHNIQUE: CT of the abdomen and pelvis was performed with the administration of 80 mL of iohexol  (OMNIPAQUE ) 300 MG/ML solution. Multiplanar reformatted images are provided for review. Automated exposure control, iterative reconstruction, and/or weight-based adjustment of the mA/kV was utilized to reduce the radiation dose to as low as reasonably achievable. COMPARISON: Comparison with 07/31/2020. CLINICAL HISTORY: Pancreatitis suspected. Vomiting and nausea this is Dr. FINDINGS: LOWER CHEST: Small pericardial effusion is new from prior. LIVER: Hepatic steatosis. GALLBLADDER AND BILE DUCTS: Cholecystectomy. No biliary ductal dilatation. SPLEEN: No acute abnormality. PANCREAS: No acute abnormality. ADRENAL GLANDS: No acute abnormality. KIDNEYS, URETERS AND BLADDER: No stones in the kidneys or ureters. No hydronephrosis. No perinephric or periureteral stranding. Urinary bladder is unremarkable. GI AND BOWEL: Stomach demonstrates no acute abnormality. Small bowel at the upper limits of normal and filled with fluid with wall thickening and thickening of the valvulae conniventes. There is swirling of vessels in the right lower quadrant with 2 adjacent transition points (series 7 image 75). There is fecalization  of the bowel between the 2 transition points. Findings are concerning for an obstruction possibly due to internal hernia or closed loop obstruction . Colon is decompressed. PERITONEUM AND RETROPERITONEUM: Small volume pelvic and perihepatic ascites. No free air. VASCULATURE: Aorta is normal in caliber. Aortic atherosclerotic calcification. LYMPH NODES: No lymphadenopathy. REPRODUCTIVE ORGANS: No acute abnormality. BONES AND SOFT TISSUES: No acute osseous abnormality. No focal soft tissue abnormality. IMPRESSION: 1. Small bowel obstruction with 2 adjacent transition points in the right lower quadrant and fecalization of the bowel between the transition points. This is concerning for internal hernia or closed loop obstruction. No signs of ischemia. Surgical consult recommended. 2. Small pericardial effusion, new from prior. 3. Small volume pelvic and perihepatic ascites. 4. Hepatic steatosis. Critical value/emergent results were called by telephone at the time of interpretation on 11 / 8 / 25 at 08:00 pm to Dr. Mannie, who verbally acknowledged these results. Electronically signed by: Norman Gatlin MD 03/27/2024 08:04 PM EST RP Workstation: [id number]      Subjective: Patient seen and examined at bedside today.  Very comfortable.  Eating her breakfast.  No new complaints, medically stable for discharge today  Discharge Exam: Vitals:   04/19/24 2009 04/20/24 0519  BP: 110/71 102/73  Pulse: 61 64  Resp: 18 18  Temp: 98.3 F (36.8 C) 98.3 F (36.8 C)  SpO2: 100% 100%   Vitals:   04/19/24 1412 04/19/24 2009 04/19/24 2009 04/20/24 0519  BP: (!) 141/82 110/71 110/71 102/73  Pulse: 78 63 61 64  Resp: 18 18 18 18   Temp: 98.6 F (37 C) 98.3 F (36.8 C) 98.3 F (36.8 C) 98.3 F (36.8 C)  TempSrc: Oral Oral Oral Oral  SpO2: 100% 100% 100% 100%  Weight:      Height:        General: Pt is alert, awake, not in acute distress Cardiovascular: RRR, S1/S2 +, no rubs, no gallops Respiratory: CTA  bilaterally, no wheezing, no rhonchi Abdominal: Soft, NT, ND, bowel sounds +, surgical staples on lower abdomen Extremities: no edema, no cyanosis    The results of significant diagnostics from this hospitalization (including imaging, microbiology, ancillary and laboratory) are listed below for reference.     Microbiology: No results found for this or any previous visit (from the past 240 hours).   Labs: BNP (last 3 results) No results for input(s): BNP in the last 8760 hours. Basic Metabolic Panel: Recent Labs  Lab 04/15/24 0255 04/20/24 0443  NA 138 141  K 4.0 3.7  CL 104 104  CO2 26 28  GLUCOSE 100* 81  BUN 15 11  CREATININE 0.80 0.77  CALCIUM  8.7* 9.1  MG 2.1  --   PHOS 3.2  --    Liver Function Tests: Recent Labs  Lab 04/15/24 0255  AST 37  ALT 22  ALKPHOS 68  BILITOT 0.3  PROT 6.7  ALBUMIN 2.9*   No results for input(s): LIPASE, AMYLASE in the last 168 hours. No results for input(s): AMMONIA in the last 168 hours. CBC: Recent Labs  Lab 04/14/24 0149 04/15/24 0255 04/20/24 0443  WBC 7.1 6.0 4.9  HGB 7.9* 8.1* 9.0*  HCT 24.1* 24.5* 26.8*  MCV 94.5 93.9 93.1  PLT 361 419* 373   Cardiac Enzymes: No results for input(s): CKTOTAL, CKMB, CKMBINDEX, TROPONINI in the last 168 hours. BNP: Invalid input(s): POCBNP CBG: No results for input(s): GLUCAP in the last 168 hours. D-Dimer No results for input(s): DDIMER in the last 72 hours. Hgb A1c No results for input(s): HGBA1C in the last 72 hours. Lipid Profile No results for input(s): CHOL, HDL, LDLCALC, TRIG, CHOLHDL, LDLDIRECT in the last 72 hours. Thyroid  function studies No results for input(s): TSH, T4TOTAL, T3FREE, THYROIDAB in the last 72 hours.  Invalid input(s): FREET3 Anemia work up No results for input(s): VITAMINB12, FOLATE, FERRITIN, TIBC, IRON, RETICCTPCT in the last 72 hours. Urinalysis    Component Value Date/Time    COLORURINE STRAW (A) 03/27/2024 2012   APPEARANCEUR CLEAR 03/27/2024 2012   LABSPEC 1.013 03/27/2024 2012   PHURINE 7.0 03/27/2024 2012   GLUCOSEU NEGATIVE 03/27/2024 2012   GLUCOSEU NEGATIVE 07/23/2010 1623   HGBUR NEGATIVE 03/27/2024 2012   BILIRUBINUR NEGATIVE 03/27/2024 2012   KETONESUR NEGATIVE 03/27/2024 2012   PROTEINUR NEGATIVE 03/27/2024 2012   UROBILINOGEN 0.2 07/23/2010 1623   NITRITE NEGATIVE 03/27/2024 2012   LEUKOCYTESUR NEGATIVE 03/27/2024 2012   Sepsis Labs Recent Labs  Lab 04/14/24 0149 04/15/24 0255 04/20/24 0443  WBC 7.1 6.0 4.9   Microbiology No results found for this or any previous visit (from the past 240 hours).  Please note: You were cared for by a hospitalist during your hospital stay. Once you are discharged, your primary care physician will handle any further medical issues. Please note that NO REFILLS for any discharge medications will be authorized once you are discharged,  as it is imperative that you return to your primary care physician (or establish a relationship with a primary care physician if you do not have one) for your post hospital discharge needs so that they can reassess your need for medications and monitor your lab values.    Time coordinating discharge: 40 minutes  SIGNED:   Ivonne Mustache, MD  [facility] 04/20/2024, 11:25 AM Pager [phone]  If 7PM-7AM, please contact night-coverage [email or web address] Password [email or web address]

## 2024-04-20 NOTE — Plan of Care (Signed)
   Problem: Education: Goal: Knowledge of General Education information will improve Description: Including pain rating scale, medication(s)/side effects and non-pharmacologic comfort measures Outcome: Progressing   Problem: Nutrition: Goal: Adequate nutrition will be maintained Outcome: Progressing

## 2024-04-20 NOTE — Plan of Care (Signed)
   Problem: Clinical Measurements: Goal: Diagnostic test results will improve Outcome: Progressing

## 2024-04-20 NOTE — TOC Transition Note (Signed)
 Transition of Care [facility]) - Discharge Note   Patient Details  Name: Dominique Mooney MRN: [id number] Date of Birth: 02-23-1951  [facility]) CM/SW Contact:  NORMAN ASPEN, LCSW Phone Number: 04/20/2024, 11:45 AM   Clinical Narrative:     Have spoken with pt's daughter today and explained that pt is medically cleared for discharge.  She is continuing to visit local AL/ memory care facilities and understands that she will need to continue to do this from home.  Daughter understands and agreeable with dc home and family will continue to provide needed supervision while they work on securing facility.  No follow therapy needed.  As requested, have faxed information to requested facilities and will provide daughter with copy of TB skin test results.  No further IP CM needs.  Final next level of care: Home/Self Care Barriers to Discharge: Barriers Resolved   Patient Goals and CMS Choice            Discharge Placement                       Discharge Plan and Services Additional resources added to the After Visit Summary for                  DME Arranged: N/A DME Agency: NA                  Social Drivers of Health (SDOH) Interventions SDOH Screenings   Food Insecurity: No Food Insecurity (03/27/2024)  Housing: High Risk (03/27/2024)  Transportation Needs: No Transportation Needs (03/27/2024)  Utilities: Not At Risk (03/27/2024)  Social Connections: Unknown (03/27/2024)  Tobacco Use: Low Risk  (04/01/2024)     Readmission Risk Interventions    03/30/2024    4:16 PM  Readmission Risk Prevention Plan  Post Dischage Appt Complete  Medication Screening Complete  Transportation Screening Complete

## 2024-04-22 ENCOUNTER — Other Ambulatory Visit (HOSPITAL_COMMUNITY): Payer: Self-pay

## 2024-04-23 ENCOUNTER — Other Ambulatory Visit (HOSPITAL_COMMUNITY): Payer: Self-pay

## 2024-04-25 ENCOUNTER — Other Ambulatory Visit (HOSPITAL_COMMUNITY): Payer: Self-pay

## 2024-04-28 ENCOUNTER — Ambulatory Visit: Admitting: Neurology

## 2024-04-28 ENCOUNTER — Other Ambulatory Visit (HOSPITAL_COMMUNITY): Payer: Self-pay

## 2024-04-29 ENCOUNTER — Ambulatory Visit: Admitting: Physician Assistant

## 2024-05-04 NOTE — Progress Notes (Incomplete)
 Dominique Mooney is a very pleasant 73 y.o. RH female with a history of hypertension, hyperlipidemia, hypothyroidism, DM2, asthma GERD, IBS, anxiety, CAD, arthritis, autoimmune hepatitis CKD, seen today in follow up for memory loss. Patient is currently on donepezil  5 mg daily, tolerating well.***.  This patient is accompanied in the office by her LPN daughter*** who supplements the history.  Previous records as well as any outside records available were reviewed prior to todays visit. Patient was last seen on 02/16/2024***. Memory is **.  Patient is able to participate on ADLs and continues to drive without difficulties. Mood is fair***    Increase donepezil  to 10 mg daily, side effects discussed Continue to control mood medications as per PCP Recommend good control of cardiovascular risk factors Recommend assisted living for cognitive and social stimulation as well as for safety time follow-up in 6 months  Discussed the use of AI scribe software for clinical note transcription with the patient, who gave verbal consent to proceed.  History of Present Illness      Any changes in memory since last visit?  repeats oneself?  Endorsed Disoriented when walking into a room? Denies ***  Leaving objects?  May misplace things but not in unusual places***  Wandering behavior?  denies   Any personality changes since last visit?  Denies.   Any worsening depression?:  Denies.   Hallucinations or paranoia?  Denies.   Seizures? denies    Any sleep changes?  Denies vivid dreams, REM behavior or sleepwalking   Sleep apnea?   Denies.   Any hygiene concerns? Denies.  Independent of bathing and dressing?  Endorsed  Does the patient needs help with medications?   is in charge *** Who is in charge of the finances?   is in charge   *** Any changes in appetite?  denies ***   Patient have trouble swallowing? Denies.   Does the patient cook? No Any headaches?   denies   Any vision changes?*** Chronic  back pain  denies   Ambulates with difficulty? Denies.  *** Recent falls or head injuries? Denies.     Unilateral weakness, numbness or tingling? denies   Any tremors?  Denies  *** Any anosmia?  Denies   Any incontinence of urine?  Endorsed***  Any bowel dysfunction?   Denies      Patient lives   *** Does the patient drive? No longer drives ***  Initial visit 02/16/2024 How long did patient have memory difficulties? Patient denies and becomes quite anxious saying I just learned this morning that I have an appointment and I am really angry at my daughter .  She denies any short-term memory issues, there is nothing wrong with me .  Long-term memory is good. Likes to read, crosswords, going to [employer]. repeats oneself?  Endorsed, witnessed during this visit Disoriented when walking into a room?  Patient denies  Leaving objects in unusual places? Denies.   Wandering behavior?  denies .  Any personality changes?  Denies.   Any history of depression?:  Denies. She has anxiety, worse for the last 2 months, she feels that the family wants to take over her home. She describes herself as a perfectionist and likes to do things on her own, it causes her anxiety and depression being assisted all the time, watch by my children.  I feel that they want to get rid of me .     Hallucinations or paranoia?  Denies hallucinations, she is somewhat  paranoid about the feeling that her children want to get rid of her .  My daughter thinks I am old and I feel disrespected .  In addition, she states that my father was killed when I was 8, I witnessed it, he was lying in the floor. So I lock the doors for that reason, to make sure no one gets him  Seizures?  Denies    Any sleep changes? Does not sleep well, recently started on mirtazapine. Denies vivid dreams, REM behavior or sleepwalking   Sleep apnea?  Denies   Any hygiene concerns?  Denies   Independent of bathing and dressing?  Endorsed  Does the  patient needs help with medications? Patient is in charge   Who is in charge of the finances? Patient is in charge     Any changes in appetite?  Denies. Tries to eat healthy     Patient have trouble swallowing? Denies.   Does the patient cook? No    Any kitchen accidents such as leaving the stove on? Denies.   Any history of headaches?   Denies.   Chronic pain ? Denies.   Ambulates with difficulty?  Denies.  I walk a lot  Recent falls or head injuries? Denies.   Vision changes? Denies.   Any stroke like symptoms? Denies.   Any tremors?   Denies.   Any anosmia?  Denies.   Any incontinence of urine? Denies.   Any bowel dysfunction? Denies.      Patient lives alone during the day but her daughter or son stay with her at night, as she has fear of being alone  History of heavy alcohol intake? Denies.   History of heavy tobacco use? Denies.   Family history of dementia? One sister much older, may have some memory issues. Does patient drive? Yes, denies getting lost        No data to display            02/16/2024    8:00 AM  Montreal Cognitive Assessment   Visuospatial/ Executive (0/5) 1  Naming (0/3) 2  Attention: Read list of digits (0/2) 2  Attention: Read list of letters (0/1) 1  Attention: Serial 7 subtraction starting at 100 (0/3) 0  Language: Repeat phrase (0/2) 2  Language : Fluency (0/1) 0  Abstraction (0/2) 1  Delayed Recall (0/5) 0  Orientation (0/6) 5  Total 14  Adjusted Score (based on education) 15      Objective:    Neurological Exam:    VITALS:  There were no vitals filed for this visit.  GEN:  The patient appears stated age and is in NAD. HEENT:  Normocephalic, atraumatic.   Neurological examination:  General: NAD, well-groomed, appears stated age. Orientation: The patient is alert. Oriented to person, place and not to date Cranial nerves: There is good facial symmetry.The speech is fluent and clear. No aphasia or dysarthria. Fund of knowledge  is reduced. Recent and remote memory are impaired. Attention and concentration are reduced. Able to name objects and repeat phrases.  Hearing is intact to conversational tone. *** Sensation: Sensation is intact to light touch throughout Motor: Strength is at least antigravity x4. DTR's 2/4 in UE/LE     Movement examination:  Tone: There is normal tone in the UE/LE Abnormal movements:  no tremor.  No myoclonus.  No asterixis.   Coordination:  There is no decremation with RAM's. Normal finger to nose  Gait and Station: The patient has no*** difficulty  arising out of a deep-seated chair without the use of the hands. The patient's stride length is good.  Gait is cautious and narrow.    Thank you for allowing us  the opportunity to participate in the care of this nice patient. Please do not hesitate to contact us  for any questions or concerns.   Total time spent on today's visit was *** minutes dedicated to this patient today, preparing to see patient, examining the patient, ordering tests and/or medications and counseling the patient, documenting clinical information in the EHR or other health record, independently interpreting results and communicating results to the patient/family, discussing treatment and goals, answering patient's questions and coordinating care.  Cc:  [name], [name], PA-C  Buffey Zabinski 05/04/2024 5:41 AM

## 2024-05-05 ENCOUNTER — Encounter: Payer: Self-pay | Admitting: Physician Assistant

## 2024-05-05 ENCOUNTER — Ambulatory Visit: Admitting: Physician Assistant

## 2024-06-15 ENCOUNTER — Other Ambulatory Visit: Payer: Self-pay | Admitting: Physician Assistant
